# Patient Record
Sex: Female | Born: 1955
Health system: Southern US, Community
[De-identification: ages and names within clinical notes are randomized; demographics above are authoritative.]

## PROBLEM LIST (undated history)

## (undated) DIAGNOSIS — J45909 Unspecified asthma, uncomplicated: Secondary | ICD-10-CM

## (undated) DIAGNOSIS — R0602 Shortness of breath: Secondary | ICD-10-CM

## (undated) DIAGNOSIS — M51379 Other intervertebral disc degeneration, lumbosacral region without mention of lumbar back pain or lower extremity pain: Secondary | ICD-10-CM

## (undated) DIAGNOSIS — I8390 Asymptomatic varicose veins of unspecified lower extremity: Secondary | ICD-10-CM

## (undated) DIAGNOSIS — K5730 Diverticulosis of large intestine without perforation or abscess without bleeding: Secondary | ICD-10-CM

## (undated) DIAGNOSIS — T7840XA Allergy, unspecified, initial encounter: Secondary | ICD-10-CM

## (undated) DIAGNOSIS — E669 Obesity, unspecified: Secondary | ICD-10-CM

## (undated) DIAGNOSIS — M545 Low back pain: Secondary | ICD-10-CM

## (undated) DIAGNOSIS — M5137 Other intervertebral disc degeneration, lumbosacral region: Secondary | ICD-10-CM

## (undated) DIAGNOSIS — E785 Hyperlipidemia, unspecified: Secondary | ICD-10-CM

## (undated) DIAGNOSIS — K219 Gastro-esophageal reflux disease without esophagitis: Secondary | ICD-10-CM

## (undated) DIAGNOSIS — R011 Cardiac murmur, unspecified: Secondary | ICD-10-CM

## (undated) DIAGNOSIS — E039 Hypothyroidism, unspecified: Secondary | ICD-10-CM

## (undated) DIAGNOSIS — R002 Palpitations: Secondary | ICD-10-CM

## (undated) DIAGNOSIS — Z87442 Personal history of urinary calculi: Secondary | ICD-10-CM

## (undated) DIAGNOSIS — E079 Disorder of thyroid, unspecified: Secondary | ICD-10-CM

## (undated) DIAGNOSIS — F419 Anxiety disorder, unspecified: Secondary | ICD-10-CM

## (undated) DIAGNOSIS — Z862 Personal history of diseases of the blood and blood-forming organs and certain disorders involving the immune mechanism: Secondary | ICD-10-CM

## (undated) DIAGNOSIS — G8929 Other chronic pain: Secondary | ICD-10-CM

## (undated) DIAGNOSIS — R519 Headache, unspecified: Secondary | ICD-10-CM

## (undated) DIAGNOSIS — S060X9A Concussion with loss of consciousness of unspecified duration, initial encounter: Secondary | ICD-10-CM

## (undated) DIAGNOSIS — J189 Pneumonia, unspecified organism: Secondary | ICD-10-CM

## (undated) DIAGNOSIS — J4530 Mild persistent asthma, uncomplicated: Secondary | ICD-10-CM

## (undated) DIAGNOSIS — M1990 Unspecified osteoarthritis, unspecified site: Secondary | ICD-10-CM

## (undated) DIAGNOSIS — N393 Stress incontinence (female) (male): Secondary | ICD-10-CM

## (undated) DIAGNOSIS — I2510 Atherosclerotic heart disease of native coronary artery without angina pectoris: Secondary | ICD-10-CM

## (undated) DIAGNOSIS — R600 Localized edema: Secondary | ICD-10-CM

## (undated) DIAGNOSIS — Z860101 Personal history of adenomatous and serrated colon polyps: Secondary | ICD-10-CM

## (undated) DIAGNOSIS — R112 Nausea with vomiting, unspecified: Secondary | ICD-10-CM

## (undated) DIAGNOSIS — M519 Unspecified thoracic, thoracolumbar and lumbosacral intervertebral disc disorder: Secondary | ICD-10-CM

## (undated) DIAGNOSIS — T4145XA Adverse effect of unspecified anesthetic, initial encounter: Secondary | ICD-10-CM

## (undated) DIAGNOSIS — Z8489 Family history of other specified conditions: Secondary | ICD-10-CM

## (undated) DIAGNOSIS — I8393 Asymptomatic varicose veins of bilateral lower extremities: Secondary | ICD-10-CM

## (undated) DIAGNOSIS — N200 Calculus of kidney: Secondary | ICD-10-CM

## (undated) DIAGNOSIS — Z9889 Other specified postprocedural states: Secondary | ICD-10-CM

## (undated) DIAGNOSIS — Z86010 Personal history of colonic polyps: Secondary | ICD-10-CM

## (undated) DIAGNOSIS — N312 Flaccid neuropathic bladder, not elsewhere classified: Secondary | ICD-10-CM

## (undated) HISTORY — DX: Low back pain: M54.5

## (undated) HISTORY — PX: COLONOSCOPY: SHX174

## (undated) HISTORY — DX: Other chronic pain: G89.29

## (undated) HISTORY — DX: Flaccid neuropathic bladder, not elsewhere classified: N31.2

## (undated) HISTORY — DX: Disorder of thyroid, unspecified: E07.9

## (undated) HISTORY — PX: OVARIAN CYST SURGERY: SHX726

## (undated) HISTORY — PX: NASAL SINUS SURGERY: SHX719

## (undated) HISTORY — DX: Personal history of colonic polyps: Z86.010

## (undated) HISTORY — PX: CHOLECYSTECTOMY: SHX55

## (undated) HISTORY — DX: Obesity, unspecified: E66.9

## (undated) HISTORY — PX: BREAST SURGERY: SHX581

## (undated) HISTORY — DX: Calculus of kidney: N20.0

## (undated) HISTORY — DX: Personal history of adenomatous and serrated colon polyps: Z86.0101

## (undated) HISTORY — DX: Atherosclerotic heart disease of native coronary artery without angina pectoris: I25.10

## (undated) HISTORY — DX: Personal history of urinary calculi: Z87.442

## (undated) HISTORY — PX: ABDOMINAL HYSTERECTOMY: SHX81

## (undated) HISTORY — DX: Unspecified osteoarthritis, unspecified site: M19.90

## (undated) HISTORY — DX: Hyperlipidemia, unspecified: E78.5

## (undated) HISTORY — DX: Unspecified thoracic, thoracolumbar and lumbosacral intervertebral disc disorder: M51.9

## (undated) HISTORY — PX: WISDOM TOOTH EXTRACTION: SHX21

## (undated) HISTORY — DX: Diverticulosis of large intestine without perforation or abscess without bleeding: K57.30

## (undated) HISTORY — DX: Anxiety disorder, unspecified: F41.9

## (undated) HISTORY — PX: DIAGNOSTIC LAPAROSCOPY: SUR761

## (undated) HISTORY — DX: Allergy, unspecified, initial encounter: T78.40XA

## (undated) HISTORY — PX: BLADDER SURGERY: SHX569

---

## 1990-04-24 HISTORY — PX: VAGINAL HYSTERECTOMY: SUR661

## 1995-04-25 DIAGNOSIS — Z87820 Personal history of traumatic brain injury: Secondary | ICD-10-CM

## 1995-04-25 HISTORY — DX: Personal history of traumatic brain injury: Z87.820

## 1998-02-16 ENCOUNTER — Ambulatory Visit (HOSPITAL_COMMUNITY): Admission: RE | Admit: 1998-02-16 | Discharge: 1998-02-16 | Payer: Self-pay | Admitting: *Deleted

## 1998-04-24 HISTORY — PX: LAPAROSCOPIC CHOLECYSTECTOMY: SUR755

## 1998-05-04 ENCOUNTER — Encounter: Admission: RE | Admit: 1998-05-04 | Discharge: 1998-07-13 | Payer: Self-pay | Admitting: *Deleted

## 1998-05-05 ENCOUNTER — Encounter: Admission: RE | Admit: 1998-05-05 | Discharge: 1998-06-28 | Payer: Self-pay | Admitting: *Deleted

## 1998-05-14 ENCOUNTER — Encounter: Payer: Self-pay | Admitting: *Deleted

## 1998-05-14 ENCOUNTER — Ambulatory Visit (HOSPITAL_COMMUNITY): Admission: RE | Admit: 1998-05-14 | Discharge: 1998-05-14 | Payer: Self-pay | Admitting: *Deleted

## 1998-06-16 ENCOUNTER — Emergency Department (HOSPITAL_COMMUNITY): Admission: EM | Admit: 1998-06-16 | Discharge: 1998-06-16 | Payer: Self-pay | Admitting: Emergency Medicine

## 1998-06-28 ENCOUNTER — Encounter: Admission: RE | Admit: 1998-06-28 | Discharge: 1998-07-19 | Payer: Self-pay | Admitting: *Deleted

## 1998-07-02 ENCOUNTER — Ambulatory Visit (HOSPITAL_COMMUNITY): Admission: RE | Admit: 1998-07-02 | Discharge: 1998-07-02 | Payer: Self-pay | Admitting: *Deleted

## 1998-07-07 ENCOUNTER — Ambulatory Visit (HOSPITAL_COMMUNITY): Admission: RE | Admit: 1998-07-07 | Discharge: 1998-07-07 | Payer: Self-pay | Admitting: Family Medicine

## 1998-07-07 ENCOUNTER — Encounter: Payer: Self-pay | Admitting: Family Medicine

## 1998-07-08 ENCOUNTER — Emergency Department (HOSPITAL_COMMUNITY): Admission: EM | Admit: 1998-07-08 | Discharge: 1998-07-08 | Payer: Self-pay

## 1998-07-13 ENCOUNTER — Encounter: Admission: RE | Admit: 1998-07-13 | Discharge: 1998-10-11 | Payer: Self-pay | Admitting: Anesthesiology

## 1998-09-06 ENCOUNTER — Ambulatory Visit: Admission: RE | Admit: 1998-09-06 | Discharge: 1998-09-06 | Payer: Self-pay | Admitting: Occupational Medicine

## 1998-10-20 ENCOUNTER — Encounter (HOSPITAL_COMMUNITY): Admission: RE | Admit: 1998-10-20 | Discharge: 1999-01-18 | Payer: Self-pay | Admitting: Occupational Medicine

## 1998-11-26 ENCOUNTER — Encounter: Payer: Self-pay | Admitting: Emergency Medicine

## 1998-11-26 ENCOUNTER — Emergency Department (HOSPITAL_COMMUNITY): Admission: EM | Admit: 1998-11-26 | Discharge: 1998-11-26 | Payer: Self-pay | Admitting: Emergency Medicine

## 1998-12-01 ENCOUNTER — Inpatient Hospital Stay (HOSPITAL_COMMUNITY): Admission: EM | Admit: 1998-12-01 | Discharge: 1998-12-02 | Payer: Self-pay | Admitting: Emergency Medicine

## 1998-12-01 ENCOUNTER — Encounter: Payer: Self-pay | Admitting: General Surgery

## 1998-12-12 ENCOUNTER — Encounter: Payer: Self-pay | Admitting: Neurosurgery

## 1998-12-12 ENCOUNTER — Ambulatory Visit (HOSPITAL_COMMUNITY): Admission: RE | Admit: 1998-12-12 | Discharge: 1998-12-12 | Payer: Self-pay | Admitting: Neurosurgery

## 1998-12-14 ENCOUNTER — Encounter: Payer: Self-pay | Admitting: *Deleted

## 1998-12-14 ENCOUNTER — Ambulatory Visit (HOSPITAL_COMMUNITY): Admission: RE | Admit: 1998-12-14 | Discharge: 1998-12-14 | Payer: Self-pay | Admitting: *Deleted

## 1998-12-16 ENCOUNTER — Encounter: Payer: Self-pay | Admitting: General Surgery

## 1998-12-16 ENCOUNTER — Ambulatory Visit (HOSPITAL_COMMUNITY): Admission: RE | Admit: 1998-12-16 | Discharge: 1998-12-16 | Payer: Self-pay | Admitting: General Surgery

## 1998-12-21 ENCOUNTER — Encounter: Payer: Self-pay | Admitting: General Surgery

## 1998-12-23 ENCOUNTER — Encounter (INDEPENDENT_AMBULATORY_CARE_PROVIDER_SITE_OTHER): Payer: Self-pay | Admitting: Specialist

## 1998-12-23 ENCOUNTER — Observation Stay (HOSPITAL_COMMUNITY): Admission: RE | Admit: 1998-12-23 | Discharge: 1998-12-24 | Payer: Self-pay | Admitting: General Surgery

## 1998-12-23 ENCOUNTER — Encounter: Payer: Self-pay | Admitting: General Surgery

## 1999-01-06 ENCOUNTER — Ambulatory Visit (HOSPITAL_COMMUNITY): Admission: RE | Admit: 1999-01-06 | Discharge: 1999-01-06 | Payer: Self-pay | Admitting: *Deleted

## 1999-01-06 ENCOUNTER — Encounter: Payer: Self-pay | Admitting: *Deleted

## 1999-01-12 ENCOUNTER — Ambulatory Visit (HOSPITAL_COMMUNITY): Admission: RE | Admit: 1999-01-12 | Discharge: 1999-01-12 | Payer: Self-pay | Admitting: Anesthesiology

## 1999-01-12 ENCOUNTER — Encounter: Payer: Self-pay | Admitting: General Surgery

## 1999-01-31 ENCOUNTER — Encounter: Admission: RE | Admit: 1999-01-31 | Discharge: 1999-05-01 | Payer: Self-pay | Admitting: *Deleted

## 1999-04-15 ENCOUNTER — Encounter: Payer: Self-pay | Admitting: *Deleted

## 1999-04-15 ENCOUNTER — Encounter: Admission: RE | Admit: 1999-04-15 | Discharge: 1999-04-15 | Payer: Self-pay | Admitting: *Deleted

## 1999-10-04 ENCOUNTER — Other Ambulatory Visit: Admission: RE | Admit: 1999-10-04 | Discharge: 1999-10-04 | Payer: Self-pay | Admitting: *Deleted

## 2000-03-27 ENCOUNTER — Ambulatory Visit (HOSPITAL_COMMUNITY): Admission: RE | Admit: 2000-03-27 | Discharge: 2000-03-27 | Payer: Self-pay | Admitting: *Deleted

## 2000-03-27 ENCOUNTER — Encounter: Payer: Self-pay | Admitting: *Deleted

## 2000-04-18 ENCOUNTER — Encounter: Payer: Self-pay | Admitting: *Deleted

## 2000-04-18 ENCOUNTER — Encounter: Admission: RE | Admit: 2000-04-18 | Discharge: 2000-04-18 | Payer: Self-pay | Admitting: *Deleted

## 2000-07-07 ENCOUNTER — Emergency Department (HOSPITAL_COMMUNITY): Admission: EM | Admit: 2000-07-07 | Discharge: 2000-07-08 | Payer: Self-pay | Admitting: Emergency Medicine

## 2000-07-08 ENCOUNTER — Encounter: Payer: Self-pay | Admitting: Emergency Medicine

## 2000-11-20 ENCOUNTER — Encounter: Payer: Self-pay | Admitting: Internal Medicine

## 2000-11-20 ENCOUNTER — Ambulatory Visit (HOSPITAL_COMMUNITY): Admission: RE | Admit: 2000-11-20 | Discharge: 2000-11-20 | Payer: Self-pay | Admitting: Internal Medicine

## 2001-01-22 ENCOUNTER — Other Ambulatory Visit: Admission: RE | Admit: 2001-01-22 | Discharge: 2001-01-22 | Payer: Self-pay | Admitting: *Deleted

## 2001-03-26 ENCOUNTER — Ambulatory Visit (HOSPITAL_COMMUNITY): Admission: RE | Admit: 2001-03-26 | Discharge: 2001-03-26 | Payer: Self-pay | Admitting: *Deleted

## 2001-03-26 ENCOUNTER — Encounter: Payer: Self-pay | Admitting: *Deleted

## 2001-04-22 ENCOUNTER — Encounter: Admission: RE | Admit: 2001-04-22 | Discharge: 2001-04-22 | Payer: Self-pay | Admitting: *Deleted

## 2001-04-22 ENCOUNTER — Encounter: Payer: Self-pay | Admitting: *Deleted

## 2001-06-02 ENCOUNTER — Encounter: Payer: Self-pay | Admitting: Emergency Medicine

## 2001-06-02 ENCOUNTER — Emergency Department (HOSPITAL_COMMUNITY): Admission: EM | Admit: 2001-06-02 | Discharge: 2001-06-02 | Payer: Self-pay | Admitting: Emergency Medicine

## 2001-08-15 ENCOUNTER — Encounter (HOSPITAL_COMMUNITY): Admission: RE | Admit: 2001-08-15 | Discharge: 2001-11-13 | Payer: Self-pay | Admitting: *Deleted

## 2002-02-06 ENCOUNTER — Encounter (HOSPITAL_COMMUNITY): Admission: RE | Admit: 2002-02-06 | Discharge: 2002-02-06 | Payer: Self-pay | Admitting: Orthopedic Surgery

## 2002-02-10 ENCOUNTER — Ambulatory Visit (HOSPITAL_COMMUNITY): Admission: RE | Admit: 2002-02-10 | Discharge: 2002-02-10 | Payer: Self-pay | Admitting: Orthopedic Surgery

## 2002-02-10 ENCOUNTER — Encounter: Payer: Self-pay | Admitting: Orthopedic Surgery

## 2002-02-23 LAB — HM COLONOSCOPY

## 2002-03-24 ENCOUNTER — Other Ambulatory Visit: Admission: RE | Admit: 2002-03-24 | Discharge: 2002-03-24 | Payer: Self-pay | Admitting: Obstetrics and Gynecology

## 2002-04-23 ENCOUNTER — Encounter: Admission: RE | Admit: 2002-04-23 | Discharge: 2002-04-23 | Payer: Self-pay | Admitting: Obstetrics and Gynecology

## 2002-04-23 ENCOUNTER — Encounter: Payer: Self-pay | Admitting: Obstetrics and Gynecology

## 2002-05-01 ENCOUNTER — Encounter: Admission: RE | Admit: 2002-05-01 | Discharge: 2002-05-01 | Payer: Self-pay | Admitting: Obstetrics and Gynecology

## 2002-05-01 ENCOUNTER — Encounter: Payer: Self-pay | Admitting: Obstetrics and Gynecology

## 2002-07-15 ENCOUNTER — Encounter: Payer: Self-pay | Admitting: Emergency Medicine

## 2002-07-15 ENCOUNTER — Emergency Department (HOSPITAL_COMMUNITY): Admission: EM | Admit: 2002-07-15 | Discharge: 2002-07-15 | Payer: Self-pay | Admitting: Emergency Medicine

## 2002-10-15 ENCOUNTER — Encounter (HOSPITAL_COMMUNITY): Admission: RE | Admit: 2002-10-15 | Discharge: 2002-10-15 | Payer: Self-pay | Admitting: *Deleted

## 2002-12-24 ENCOUNTER — Ambulatory Visit (HOSPITAL_COMMUNITY): Admission: RE | Admit: 2002-12-24 | Discharge: 2002-12-24 | Payer: Self-pay | Admitting: *Deleted

## 2002-12-26 ENCOUNTER — Ambulatory Visit (HOSPITAL_COMMUNITY): Admission: RE | Admit: 2002-12-26 | Discharge: 2002-12-26 | Payer: Self-pay | Admitting: *Deleted

## 2003-04-01 ENCOUNTER — Other Ambulatory Visit: Admission: RE | Admit: 2003-04-01 | Discharge: 2003-04-01 | Payer: Self-pay | Admitting: Obstetrics and Gynecology

## 2003-06-04 ENCOUNTER — Encounter: Admission: RE | Admit: 2003-06-04 | Discharge: 2003-06-04 | Payer: Self-pay | Admitting: Obstetrics and Gynecology

## 2004-02-16 ENCOUNTER — Ambulatory Visit (HOSPITAL_COMMUNITY): Admission: RE | Admit: 2004-02-16 | Discharge: 2004-02-16 | Payer: Self-pay | Admitting: Obstetrics and Gynecology

## 2004-03-23 ENCOUNTER — Ambulatory Visit (HOSPITAL_COMMUNITY): Admission: RE | Admit: 2004-03-23 | Discharge: 2004-03-23 | Payer: Self-pay | Admitting: Obstetrics and Gynecology

## 2004-06-06 ENCOUNTER — Encounter: Admission: RE | Admit: 2004-06-06 | Discharge: 2004-06-06 | Payer: Self-pay | Admitting: Obstetrics and Gynecology

## 2004-06-16 ENCOUNTER — Ambulatory Visit: Payer: Self-pay | Admitting: Internal Medicine

## 2004-06-20 ENCOUNTER — Ambulatory Visit: Payer: Self-pay | Admitting: Internal Medicine

## 2004-10-07 ENCOUNTER — Ambulatory Visit: Payer: Self-pay | Admitting: Internal Medicine

## 2004-10-11 ENCOUNTER — Ambulatory Visit: Payer: Self-pay | Admitting: Internal Medicine

## 2004-12-21 ENCOUNTER — Other Ambulatory Visit: Admission: RE | Admit: 2004-12-21 | Discharge: 2004-12-21 | Payer: Self-pay | Admitting: Obstetrics and Gynecology

## 2005-01-06 ENCOUNTER — Ambulatory Visit (HOSPITAL_COMMUNITY): Admission: RE | Admit: 2005-01-06 | Discharge: 2005-01-06 | Payer: Self-pay | Admitting: Internal Medicine

## 2005-01-06 ENCOUNTER — Ambulatory Visit: Payer: Self-pay | Admitting: Internal Medicine

## 2005-01-09 ENCOUNTER — Ambulatory Visit: Payer: Self-pay | Admitting: Internal Medicine

## 2005-02-07 ENCOUNTER — Ambulatory Visit: Payer: Self-pay | Admitting: Internal Medicine

## 2005-06-05 ENCOUNTER — Emergency Department (HOSPITAL_COMMUNITY): Admission: EM | Admit: 2005-06-05 | Discharge: 2005-06-05 | Payer: Self-pay | Admitting: Family Medicine

## 2005-06-10 ENCOUNTER — Ambulatory Visit: Payer: Self-pay | Admitting: Family Medicine

## 2005-08-10 ENCOUNTER — Ambulatory Visit (HOSPITAL_COMMUNITY): Admission: RE | Admit: 2005-08-10 | Discharge: 2005-08-10 | Payer: Self-pay | Admitting: Obstetrics and Gynecology

## 2005-08-23 ENCOUNTER — Encounter: Payer: Self-pay | Admitting: Emergency Medicine

## 2005-12-20 ENCOUNTER — Ambulatory Visit: Payer: Self-pay | Admitting: Internal Medicine

## 2005-12-27 ENCOUNTER — Ambulatory Visit: Payer: Self-pay | Admitting: Internal Medicine

## 2006-01-19 ENCOUNTER — Ambulatory Visit: Payer: Self-pay | Admitting: Internal Medicine

## 2006-03-21 ENCOUNTER — Ambulatory Visit (HOSPITAL_COMMUNITY)
Admission: RE | Admit: 2006-03-21 | Discharge: 2006-03-21 | Payer: Self-pay | Admitting: Physical Medicine and Rehabilitation

## 2006-04-09 ENCOUNTER — Encounter
Admission: RE | Admit: 2006-04-09 | Discharge: 2006-04-09 | Payer: Self-pay | Admitting: Physical Medicine and Rehabilitation

## 2006-04-09 ENCOUNTER — Emergency Department (HOSPITAL_COMMUNITY): Admission: EM | Admit: 2006-04-09 | Discharge: 2006-04-09 | Payer: Self-pay | Admitting: Emergency Medicine

## 2006-04-14 ENCOUNTER — Emergency Department (HOSPITAL_COMMUNITY): Admission: EM | Admit: 2006-04-14 | Discharge: 2006-04-14 | Payer: Self-pay | Admitting: Emergency Medicine

## 2006-04-24 HISTORY — PX: CYSTOSCOPY: SUR368

## 2006-05-01 ENCOUNTER — Ambulatory Visit (HOSPITAL_COMMUNITY): Admission: RE | Admit: 2006-05-01 | Discharge: 2006-05-01 | Payer: Self-pay | Admitting: Orthopedic Surgery

## 2006-05-31 ENCOUNTER — Ambulatory Visit (HOSPITAL_COMMUNITY): Admission: RE | Admit: 2006-05-31 | Discharge: 2006-05-31 | Payer: Self-pay | Admitting: Orthopedic Surgery

## 2006-07-27 ENCOUNTER — Ambulatory Visit: Payer: Self-pay | Admitting: Internal Medicine

## 2006-08-24 ENCOUNTER — Ambulatory Visit (HOSPITAL_COMMUNITY): Admission: RE | Admit: 2006-08-24 | Discharge: 2006-08-24 | Payer: Self-pay | Admitting: Obstetrics and Gynecology

## 2006-10-22 ENCOUNTER — Ambulatory Visit: Payer: Self-pay | Admitting: Internal Medicine

## 2007-01-02 ENCOUNTER — Ambulatory Visit: Payer: Self-pay | Admitting: Internal Medicine

## 2007-01-31 ENCOUNTER — Ambulatory Visit: Payer: Self-pay | Admitting: Internal Medicine

## 2007-01-31 LAB — CONVERTED CEMR LAB
ALT: 24 units/L (ref 0–35)
AST: 22 units/L (ref 0–37)
Albumin: 3.8 g/dL (ref 3.5–5.2)
Alkaline Phosphatase: 71 units/L (ref 39–117)
BUN: 8 mg/dL (ref 6–23)
Basophils Absolute: 0 10*3/uL (ref 0.0–0.1)
Basophils Relative: 0.5 % (ref 0.0–1.0)
Bilirubin Urine: NEGATIVE
Bilirubin, Direct: 0.1 mg/dL (ref 0.0–0.3)
CO2: 27 meq/L (ref 19–32)
Calcium: 9.3 mg/dL (ref 8.4–10.5)
Chloride: 103 meq/L (ref 96–112)
Cholesterol: 145 mg/dL (ref 0–200)
Creatinine, Ser: 0.7 mg/dL (ref 0.4–1.2)
Eosinophils Absolute: 0.5 10*3/uL (ref 0.0–0.6)
Eosinophils Relative: 7.1 % — ABNORMAL HIGH (ref 0.0–5.0)
GFR calc Af Amer: 114 mL/min
GFR calc non Af Amer: 95 mL/min
Glucose, Bld: 90 mg/dL (ref 70–99)
HCT: 36.3 % (ref 36.0–46.0)
HDL: 54.4 mg/dL (ref >39.0)
Hemoglobin, Urine: NEGATIVE
Hemoglobin: 12.5 g/dL (ref 12.0–15.0)
Ketones, ur: NEGATIVE mg/dL
LDL Cholesterol: 80 mg/dL (ref 0–99)
Leukocytes, UA: NEGATIVE
Lymphocytes Relative: 27.5 % (ref 12.0–46.0)
MCHC: 34.4 g/dL (ref 30.0–36.0)
MCV: 93.8 fL (ref 78.0–100.0)
Monocytes Absolute: 0.5 10*3/uL (ref 0.2–0.7)
Monocytes Relative: 7.7 % (ref 3.0–11.0)
Neutro Abs: 4 10*3/uL (ref 1.4–7.7)
Neutrophils Relative %: 57.2 % (ref 43.0–77.0)
Nitrite: NEGATIVE
Platelets: 244 10*3/uL (ref 150–400)
Potassium: 3.4 meq/L — ABNORMAL LOW (ref 3.5–5.1)
RBC: 3.87 M/uL (ref 3.87–5.11)
RDW: 12.2 % (ref 11.5–14.6)
Sodium: 138 meq/L (ref 135–145)
Specific Gravity, Urine: <=1.005 (ref 1.000–1.03)
TSH: 2.06 microintl units/mL (ref 0.35–5.50)
Total Bilirubin: 0.6 mg/dL (ref 0.3–1.2)
Total CHOL/HDL Ratio: 2.7
Total Protein, Urine: NEGATIVE mg/dL
Total Protein: 6.8 g/dL (ref 6.0–8.3)
Triglycerides: 52 mg/dL (ref 0–149)
Urine Glucose: NEGATIVE mg/dL
Urobilinogen, UA: 0.2 (ref 0.0–1.0)
VLDL: 10 mg/dL (ref 0–40)
WBC: 6.9 10*3/uL (ref 4.5–10.5)
pH: 6.5 (ref 5.0–8.0)

## 2007-02-06 ENCOUNTER — Telehealth: Payer: Self-pay | Admitting: Internal Medicine

## 2007-02-26 ENCOUNTER — Ambulatory Visit: Payer: Self-pay | Admitting: Internal Medicine

## 2007-03-12 ENCOUNTER — Ambulatory Visit: Payer: Self-pay | Admitting: Internal Medicine

## 2007-03-14 ENCOUNTER — Encounter: Payer: Self-pay | Admitting: Internal Medicine

## 2007-04-04 ENCOUNTER — Encounter: Payer: Self-pay | Admitting: Internal Medicine

## 2007-07-08 ENCOUNTER — Ambulatory Visit: Payer: Self-pay | Admitting: Cardiology

## 2007-07-08 ENCOUNTER — Inpatient Hospital Stay (HOSPITAL_COMMUNITY): Admission: EM | Admit: 2007-07-08 | Discharge: 2007-07-09 | Payer: Self-pay | Admitting: Emergency Medicine

## 2007-07-08 ENCOUNTER — Ambulatory Visit: Payer: Self-pay | Admitting: Infectious Diseases

## 2007-07-08 ENCOUNTER — Ambulatory Visit: Payer: Self-pay | Admitting: Internal Medicine

## 2007-07-09 ENCOUNTER — Encounter: Payer: Self-pay | Admitting: Internal Medicine

## 2007-07-19 ENCOUNTER — Ambulatory Visit: Payer: Self-pay | Admitting: Internal Medicine

## 2007-07-19 DIAGNOSIS — R079 Chest pain, unspecified: Secondary | ICD-10-CM | POA: Insufficient documentation

## 2007-07-19 DIAGNOSIS — Z860100 Personal history of colon polyps, unspecified: Secondary | ICD-10-CM | POA: Insufficient documentation

## 2007-07-19 DIAGNOSIS — F411 Generalized anxiety disorder: Secondary | ICD-10-CM | POA: Insufficient documentation

## 2007-07-19 DIAGNOSIS — R339 Retention of urine, unspecified: Secondary | ICD-10-CM | POA: Insufficient documentation

## 2007-07-19 DIAGNOSIS — R942 Abnormal results of pulmonary function studies: Secondary | ICD-10-CM | POA: Insufficient documentation

## 2007-07-19 DIAGNOSIS — E039 Hypothyroidism, unspecified: Secondary | ICD-10-CM | POA: Insufficient documentation

## 2007-07-19 DIAGNOSIS — IMO0002 Reserved for concepts with insufficient information to code with codable children: Secondary | ICD-10-CM | POA: Insufficient documentation

## 2007-07-19 DIAGNOSIS — K5730 Diverticulosis of large intestine without perforation or abscess without bleeding: Secondary | ICD-10-CM | POA: Insufficient documentation

## 2007-07-19 DIAGNOSIS — J309 Allergic rhinitis, unspecified: Secondary | ICD-10-CM | POA: Insufficient documentation

## 2007-08-12 ENCOUNTER — Telehealth: Payer: Self-pay | Admitting: Internal Medicine

## 2007-08-27 ENCOUNTER — Ambulatory Visit (HOSPITAL_COMMUNITY): Admission: RE | Admit: 2007-08-27 | Discharge: 2007-08-27 | Payer: Self-pay | Admitting: Obstetrics and Gynecology

## 2007-09-06 ENCOUNTER — Encounter: Admission: RE | Admit: 2007-09-06 | Discharge: 2007-09-06 | Payer: Self-pay | Admitting: Obstetrics and Gynecology

## 2007-09-27 ENCOUNTER — Encounter: Admission: RE | Admit: 2007-09-27 | Discharge: 2007-09-27 | Payer: Self-pay | Admitting: Obstetrics and Gynecology

## 2007-10-25 ENCOUNTER — Encounter: Payer: Self-pay | Admitting: Internal Medicine

## 2007-10-30 ENCOUNTER — Encounter: Payer: Self-pay | Admitting: Internal Medicine

## 2007-11-05 ENCOUNTER — Encounter: Payer: Self-pay | Admitting: Internal Medicine

## 2007-11-06 ENCOUNTER — Ambulatory Visit: Payer: Self-pay | Admitting: Cardiology

## 2007-11-12 ENCOUNTER — Encounter: Admission: RE | Admit: 2007-11-12 | Discharge: 2007-11-12 | Payer: Self-pay | Admitting: General Surgery

## 2007-11-23 HISTORY — PX: BREAST SURGERY: SHX581

## 2007-12-02 ENCOUNTER — Encounter (INDEPENDENT_AMBULATORY_CARE_PROVIDER_SITE_OTHER): Payer: Self-pay | Admitting: General Surgery

## 2007-12-02 ENCOUNTER — Encounter: Admission: RE | Admit: 2007-12-02 | Discharge: 2007-12-02 | Payer: Self-pay | Admitting: General Surgery

## 2007-12-02 ENCOUNTER — Ambulatory Visit (HOSPITAL_BASED_OUTPATIENT_CLINIC_OR_DEPARTMENT_OTHER): Admission: RE | Admit: 2007-12-02 | Discharge: 2007-12-02 | Payer: Self-pay | Admitting: General Surgery

## 2007-12-02 HISTORY — PX: BREAST EXCISIONAL BIOPSY: SUR124

## 2007-12-09 ENCOUNTER — Encounter: Payer: Self-pay | Admitting: Internal Medicine

## 2008-01-03 ENCOUNTER — Telehealth (INDEPENDENT_AMBULATORY_CARE_PROVIDER_SITE_OTHER): Payer: Self-pay | Admitting: *Deleted

## 2008-01-11 ENCOUNTER — Emergency Department (HOSPITAL_COMMUNITY): Admission: EM | Admit: 2008-01-11 | Discharge: 2008-01-11 | Payer: Self-pay | Admitting: Family Medicine

## 2008-02-23 ENCOUNTER — Emergency Department (HOSPITAL_COMMUNITY): Admission: EM | Admit: 2008-02-23 | Discharge: 2008-02-23 | Payer: Self-pay | Admitting: Emergency Medicine

## 2008-02-25 ENCOUNTER — Ambulatory Visit: Payer: Self-pay | Admitting: Internal Medicine

## 2008-02-25 DIAGNOSIS — J019 Acute sinusitis, unspecified: Secondary | ICD-10-CM | POA: Insufficient documentation

## 2008-03-12 ENCOUNTER — Encounter
Admission: RE | Admit: 2008-03-12 | Discharge: 2008-04-21 | Payer: Self-pay | Admitting: Physical Medicine and Rehabilitation

## 2008-03-24 ENCOUNTER — Emergency Department (HOSPITAL_COMMUNITY): Admission: EM | Admit: 2008-03-24 | Discharge: 2008-03-25 | Payer: Self-pay | Admitting: Emergency Medicine

## 2008-04-08 ENCOUNTER — Telehealth: Payer: Self-pay | Admitting: Internal Medicine

## 2008-04-08 ENCOUNTER — Ambulatory Visit: Payer: Self-pay | Admitting: Internal Medicine

## 2008-04-08 LAB — CONVERTED CEMR LAB
ALT: 23 units/L (ref 0–35)
AST: 20 units/L (ref 0–37)
Albumin: 3.8 g/dL (ref 3.5–5.2)
Alkaline Phosphatase: 79 units/L (ref 39–117)
BUN: 13 mg/dL (ref 6–23)
Basophils Absolute: 0 10*3/uL (ref 0.0–0.1)
Basophils Relative: 0 % (ref 0.0–3.0)
Bilirubin Urine: NEGATIVE
Bilirubin, Direct: 0.1 mg/dL (ref 0.0–0.3)
CO2: 30 meq/L (ref 19–32)
Calcium: 9.5 mg/dL (ref 8.4–10.5)
Chloride: 104 meq/L (ref 96–112)
Cholesterol: 166 mg/dL (ref 0–200)
Creatinine, Ser: 0.7 mg/dL (ref 0.4–1.2)
Eosinophils Absolute: 0.4 10*3/uL (ref 0.0–0.7)
Eosinophils Relative: 6.8 % — ABNORMAL HIGH (ref 0.0–5.0)
GFR calc Af Amer: 114 mL/min
GFR calc non Af Amer: 94 mL/min
Glucose, Bld: 92 mg/dL (ref 70–99)
HCT: 37.9 % (ref 36.0–46.0)
HDL: 49 mg/dL (ref >39.0)
Hemoglobin: 13.2 g/dL (ref 12.0–15.0)
Ketones, ur: NEGATIVE mg/dL
LDL Cholesterol: 99 mg/dL (ref 0–99)
Leukocytes, UA: NEGATIVE
Lymphocytes Relative: 24.6 % (ref 12.0–46.0)
MCHC: 34.8 g/dL (ref 30.0–36.0)
MCV: 92.6 fL (ref 78.0–100.0)
Monocytes Absolute: 0.4 10*3/uL (ref 0.1–1.0)
Monocytes Relative: 6.9 % (ref 3.0–12.0)
Neutro Abs: 3.8 10*3/uL (ref 1.4–7.7)
Neutrophils Relative %: 61.7 % (ref 43.0–77.0)
Nitrite: NEGATIVE
Platelets: 225 10*3/uL (ref 150–400)
Potassium: 4.8 meq/L (ref 3.5–5.1)
RBC: 4.1 M/uL (ref 3.87–5.11)
RDW: 12.6 % (ref 11.5–14.6)
Sodium: 141 meq/L (ref 135–145)
Specific Gravity, Urine: <=1.005 (ref 1.000–1.03)
TSH: 1.42 microintl units/mL (ref 0.35–5.50)
Total Bilirubin: 0.6 mg/dL (ref 0.3–1.2)
Total CHOL/HDL Ratio: 3.4
Total Protein, Urine: NEGATIVE mg/dL
Total Protein: 6.6 g/dL (ref 6.0–8.3)
Triglycerides: 88 mg/dL (ref 0–149)
Urine Glucose: NEGATIVE mg/dL
Urobilinogen, UA: 0.2 (ref 0.0–1.0)
VLDL: 18 mg/dL (ref 0–40)
WBC: 6.1 10*3/uL (ref 4.5–10.5)
pH: 6.5 (ref 5.0–8.0)

## 2008-04-14 ENCOUNTER — Ambulatory Visit: Payer: Self-pay | Admitting: Internal Medicine

## 2008-04-14 DIAGNOSIS — M549 Dorsalgia, unspecified: Secondary | ICD-10-CM | POA: Insufficient documentation

## 2008-04-21 ENCOUNTER — Ambulatory Visit (HOSPITAL_BASED_OUTPATIENT_CLINIC_OR_DEPARTMENT_OTHER)
Admission: RE | Admit: 2008-04-21 | Discharge: 2008-04-21 | Payer: Self-pay | Admitting: Physical Medicine and Rehabilitation

## 2008-05-12 ENCOUNTER — Telehealth: Payer: Self-pay | Admitting: Internal Medicine

## 2008-05-12 ENCOUNTER — Ambulatory Visit: Payer: Self-pay | Admitting: Internal Medicine

## 2008-05-12 DIAGNOSIS — H8309 Labyrinthitis, unspecified ear: Secondary | ICD-10-CM | POA: Insufficient documentation

## 2008-06-02 LAB — CONVERTED CEMR LAB: Pap Smear: NORMAL

## 2008-06-03 ENCOUNTER — Ambulatory Visit: Payer: Self-pay | Admitting: Internal Medicine

## 2008-08-21 ENCOUNTER — Ambulatory Visit: Payer: Self-pay | Admitting: Endocrinology

## 2008-08-27 ENCOUNTER — Encounter: Admission: RE | Admit: 2008-08-27 | Discharge: 2008-08-27 | Payer: Self-pay | Admitting: Obstetrics and Gynecology

## 2008-09-02 ENCOUNTER — Encounter: Admission: RE | Admit: 2008-09-02 | Discharge: 2008-09-02 | Payer: Self-pay | Admitting: Obstetrics and Gynecology

## 2008-09-10 ENCOUNTER — Emergency Department (HOSPITAL_COMMUNITY): Admission: EM | Admit: 2008-09-10 | Discharge: 2008-09-10 | Payer: Self-pay | Admitting: Family Medicine

## 2008-11-27 ENCOUNTER — Ambulatory Visit: Payer: Self-pay | Admitting: Internal Medicine

## 2008-11-27 DIAGNOSIS — L0390 Cellulitis, unspecified: Secondary | ICD-10-CM | POA: Insufficient documentation

## 2008-11-27 DIAGNOSIS — J329 Chronic sinusitis, unspecified: Secondary | ICD-10-CM | POA: Insufficient documentation

## 2008-11-27 DIAGNOSIS — L0291 Cutaneous abscess, unspecified: Secondary | ICD-10-CM | POA: Insufficient documentation

## 2008-12-03 ENCOUNTER — Telehealth: Payer: Self-pay | Admitting: Internal Medicine

## 2008-12-04 ENCOUNTER — Ambulatory Visit: Payer: Self-pay | Admitting: Internal Medicine

## 2009-01-26 ENCOUNTER — Emergency Department (HOSPITAL_COMMUNITY): Admission: EM | Admit: 2009-01-26 | Discharge: 2009-01-27 | Payer: Self-pay | Admitting: Emergency Medicine

## 2009-01-27 ENCOUNTER — Observation Stay (HOSPITAL_COMMUNITY): Admission: EM | Admit: 2009-01-27 | Discharge: 2009-01-28 | Payer: Self-pay | Admitting: Emergency Medicine

## 2009-02-01 ENCOUNTER — Ambulatory Visit: Payer: Self-pay | Admitting: Internal Medicine

## 2009-02-01 DIAGNOSIS — J069 Acute upper respiratory infection, unspecified: Secondary | ICD-10-CM | POA: Insufficient documentation

## 2009-02-04 ENCOUNTER — Telehealth (INDEPENDENT_AMBULATORY_CARE_PROVIDER_SITE_OTHER): Payer: Self-pay | Admitting: *Deleted

## 2009-02-08 ENCOUNTER — Ambulatory Visit: Payer: Self-pay | Admitting: Internal Medicine

## 2009-02-08 ENCOUNTER — Ambulatory Visit: Payer: Self-pay

## 2009-02-08 ENCOUNTER — Encounter (HOSPITAL_COMMUNITY): Admission: RE | Admit: 2009-02-08 | Discharge: 2009-04-20 | Payer: Self-pay | Admitting: Internal Medicine

## 2009-02-11 ENCOUNTER — Telehealth: Payer: Self-pay | Admitting: Internal Medicine

## 2009-03-07 ENCOUNTER — Emergency Department (HOSPITAL_COMMUNITY): Admission: EM | Admit: 2009-03-07 | Discharge: 2009-03-07 | Payer: Self-pay | Admitting: Emergency Medicine

## 2009-03-11 ENCOUNTER — Ambulatory Visit: Payer: Self-pay | Admitting: Cardiology

## 2009-03-11 DIAGNOSIS — R5383 Other fatigue: Secondary | ICD-10-CM | POA: Insufficient documentation

## 2009-03-11 DIAGNOSIS — R5381 Other malaise: Secondary | ICD-10-CM | POA: Insufficient documentation

## 2009-03-12 ENCOUNTER — Telehealth: Payer: Self-pay | Admitting: Cardiology

## 2009-03-12 LAB — CONVERTED CEMR LAB
Basophils Relative: 0.6 % (ref 0.0–3.0)
Eosinophils Relative: 7.5 % — ABNORMAL HIGH (ref 0.0–5.0)
HCT: 43.1 % (ref 36.0–46.0)
Hemoglobin: 14.7 g/dL (ref 12.0–15.0)
Lymphocytes Relative: 26.2 % (ref 12.0–46.0)
MCHC: 34.1 g/dL (ref 30.0–36.0)
MCV: 94.7 fL (ref 78.0–100.0)
Monocytes Relative: 4.5 % (ref 3.0–12.0)
Neutrophils Relative %: 61.2 % (ref 43.0–77.0)
Platelets: 236 10*3/uL (ref 150.0–400.0)
RBC: 4.55 M/uL (ref 3.87–5.11)
RDW: 12.7 % (ref 11.5–14.6)
TSH: 0.42 microintl units/mL (ref 0.35–5.50)
WBC: 12.7 10*3/uL — ABNORMAL HIGH (ref 4.5–10.5)

## 2009-03-16 ENCOUNTER — Ambulatory Visit: Payer: Self-pay | Admitting: Internal Medicine

## 2009-03-16 DIAGNOSIS — D72829 Elevated white blood cell count, unspecified: Secondary | ICD-10-CM | POA: Insufficient documentation

## 2009-03-17 ENCOUNTER — Ambulatory Visit: Payer: Self-pay | Admitting: Internal Medicine

## 2009-03-17 LAB — CONVERTED CEMR LAB
Bilirubin Urine: NEGATIVE
Hemoglobin, Urine: NEGATIVE
Ketones, ur: NEGATIVE mg/dL
Leukocytes, UA: NEGATIVE
Nitrite: NEGATIVE
Specific Gravity, Urine: <=1.005 (ref 1.000–1.030)
Total Protein, Urine: NEGATIVE mg/dL
Urine Glucose: NEGATIVE mg/dL
Urobilinogen, UA: 0.2 (ref 0.0–1.0)
pH: 6 (ref 5.0–8.0)

## 2009-03-23 ENCOUNTER — Encounter: Payer: Self-pay | Admitting: Internal Medicine

## 2009-03-23 LAB — CONVERTED CEMR LAB
BUN: 10 mg/dL (ref 6–23)
CA 125: 9.3 units/mL (ref 0.0–30.2)
CO2: 22 meq/L (ref 19–32)
Calcium: 9.4 mg/dL (ref 8.4–10.5)
Chloride: 105 meq/L (ref 96–112)
Cholesterol: 147 mg/dL (ref 0–200)
Creatinine, Ser: 0.89 mg/dL (ref 0.40–1.20)
Glucose, Bld: 87 mg/dL (ref 70–99)
HCT: 42.4 % (ref 36.0–46.0)
HDL: 52 mg/dL (ref >39)
Hemoglobin: 14.1 g/dL (ref 12.0–15.0)
LDL Cholesterol: 79 mg/dL (ref 0–99)
MCHC: 33.3 g/dL (ref 30.0–36.0)
MCV: 93.4 fL (ref 78.0–100.0)
Platelets: 257 10*3/uL (ref 150–400)
Potassium: 4.3 meq/L (ref 3.5–5.3)
RBC: 4.54 M/uL (ref 3.87–5.11)
RDW: 13.1 % (ref 11.5–15.5)
Sodium: 142 meq/L (ref 135–145)
TSH: 0.402 microintl units/mL (ref 0.350–4.500)
Total CHOL/HDL Ratio: 2.8
Triglycerides: 80 mg/dL (ref <150)
VLDL: 16 mg/dL (ref 0–40)
WBC: 7.4 10*3/uL (ref 4.0–10.5)

## 2009-03-27 ENCOUNTER — Emergency Department (HOSPITAL_COMMUNITY): Admission: EM | Admit: 2009-03-27 | Discharge: 2009-03-27 | Payer: Self-pay | Admitting: Family Medicine

## 2009-06-23 ENCOUNTER — Ambulatory Visit: Payer: Self-pay | Admitting: Internal Medicine

## 2009-06-23 DIAGNOSIS — H6690 Otitis media, unspecified, unspecified ear: Secondary | ICD-10-CM | POA: Insufficient documentation

## 2009-06-30 ENCOUNTER — Emergency Department (HOSPITAL_COMMUNITY): Admission: EM | Admit: 2009-06-30 | Discharge: 2009-06-30 | Payer: Self-pay | Admitting: Family Medicine

## 2009-07-01 ENCOUNTER — Inpatient Hospital Stay (HOSPITAL_COMMUNITY): Admission: AD | Admit: 2009-07-01 | Discharge: 2009-07-03 | Payer: Self-pay | Admitting: Urology

## 2009-08-23 LAB — HM MAMMOGRAPHY: HM Mammogram: NORMAL

## 2009-08-30 ENCOUNTER — Encounter: Admission: RE | Admit: 2009-08-30 | Discharge: 2009-08-30 | Payer: Self-pay | Admitting: Obstetrics and Gynecology

## 2009-09-03 ENCOUNTER — Encounter: Admission: RE | Admit: 2009-09-03 | Discharge: 2009-09-03 | Payer: Self-pay | Admitting: Obstetrics and Gynecology

## 2009-11-10 ENCOUNTER — Ambulatory Visit: Payer: Self-pay | Admitting: Internal Medicine

## 2009-11-10 DIAGNOSIS — N390 Urinary tract infection, site not specified: Secondary | ICD-10-CM | POA: Insufficient documentation

## 2009-11-10 DIAGNOSIS — R350 Frequency of micturition: Secondary | ICD-10-CM | POA: Insufficient documentation

## 2009-11-10 DIAGNOSIS — N312 Flaccid neuropathic bladder, not elsewhere classified: Secondary | ICD-10-CM | POA: Insufficient documentation

## 2009-11-10 LAB — CONVERTED CEMR LAB
Bilirubin Urine: NEGATIVE
Blood in Urine, dipstick: NEGATIVE
Glucose, Urine, Semiquant: NEGATIVE
Ketones, urine, test strip: NEGATIVE
Nitrite: NEGATIVE
Protein, U semiquant: NEGATIVE
Specific Gravity, Urine: <1.005
Urobilinogen, UA: 0.2
WBC Urine, dipstick: NEGATIVE
pH: 5

## 2009-11-19 ENCOUNTER — Telehealth: Payer: Self-pay | Admitting: Internal Medicine

## 2009-12-23 ENCOUNTER — Telehealth: Payer: Self-pay | Admitting: Internal Medicine

## 2010-02-14 ENCOUNTER — Telehealth: Payer: Self-pay | Admitting: Internal Medicine

## 2010-02-14 ENCOUNTER — Ambulatory Visit: Payer: Self-pay | Admitting: Internal Medicine

## 2010-02-14 LAB — CONVERTED CEMR LAB
ALT: 25 units/L (ref 0–35)
AST: 27 units/L (ref 0–37)
Albumin: 4.1 g/dL (ref 3.5–5.2)
Alkaline Phosphatase: 93 units/L (ref 39–117)
BUN: 12 mg/dL (ref 6–23)
Basophils Absolute: 0 10*3/uL (ref 0.0–0.1)
Basophils Relative: 0.8 % (ref 0.0–3.0)
Bilirubin Urine: NEGATIVE
Bilirubin, Direct: 0.1 mg/dL (ref 0.0–0.3)
CO2: 25 meq/L (ref 19–32)
Calcium: 9.2 mg/dL (ref 8.4–10.5)
Chloride: 101 meq/L (ref 96–112)
Cholesterol: 155 mg/dL (ref 0–200)
Creatinine, Ser: 0.8 mg/dL (ref 0.4–1.2)
Eosinophils Absolute: 0.2 10*3/uL (ref 0.0–0.7)
Eosinophils Relative: 4.7 % (ref 0.0–5.0)
Free T4: 1.21 ng/dL (ref 0.60–1.60)
GFR calc non Af Amer: 81.13 mL/min (ref >60)
Glucose, Bld: 80 mg/dL (ref 70–99)
HCT: 42.3 % (ref 36.0–46.0)
HDL: 49.1 mg/dL (ref >39.00)
Hemoglobin, Urine: NEGATIVE
Hemoglobin: 14.7 g/dL (ref 12.0–15.0)
Ketones, ur: NEGATIVE mg/dL
LDL Cholesterol: 94 mg/dL (ref 0–99)
Leukocytes, UA: NEGATIVE
Lymphocytes Relative: 29.7 % (ref 12.0–46.0)
Lymphs Abs: 1.5 10*3/uL (ref 0.7–4.0)
MCHC: 34.8 g/dL (ref 30.0–36.0)
MCV: 93.7 fL (ref 78.0–100.0)
Monocytes Absolute: 0.3 10*3/uL (ref 0.1–1.0)
Monocytes Relative: 5.8 % (ref 3.0–12.0)
Neutro Abs: 3 10*3/uL (ref 1.4–7.7)
Neutrophils Relative %: 59 % (ref 43.0–77.0)
Nitrite: NEGATIVE
Platelets: 205 10*3/uL (ref 150.0–400.0)
Potassium: 4.2 meq/L (ref 3.5–5.1)
RBC: 4.51 M/uL (ref 3.87–5.11)
RDW: 13.3 % (ref 11.5–14.6)
Sodium: 137 meq/L (ref 135–145)
Specific Gravity, Urine: <=1.005 (ref 1.000–1.030)
T3, Free: 3.2 pg/mL (ref 2.3–4.2)
TSH: 0.23 microintl units/mL — ABNORMAL LOW (ref 0.35–5.50)
Total Bilirubin: 0.6 mg/dL (ref 0.3–1.2)
Total CHOL/HDL Ratio: 3
Total Protein, Urine: NEGATIVE mg/dL
Total Protein: 7.1 g/dL (ref 6.0–8.3)
Triglycerides: 61 mg/dL (ref 0.0–149.0)
Urine Glucose: NEGATIVE mg/dL
Urobilinogen, UA: 0.2 (ref 0.0–1.0)
VLDL: 12.2 mg/dL (ref 0.0–40.0)
WBC: 5.2 10*3/uL (ref 4.5–10.5)
pH: 6.5 (ref 5.0–8.0)

## 2010-02-21 ENCOUNTER — Ambulatory Visit: Payer: Self-pay | Admitting: Internal Medicine

## 2010-02-21 ENCOUNTER — Encounter: Payer: Self-pay | Admitting: Internal Medicine

## 2010-02-21 DIAGNOSIS — E041 Nontoxic single thyroid nodule: Secondary | ICD-10-CM | POA: Insufficient documentation

## 2010-02-21 DIAGNOSIS — M542 Cervicalgia: Secondary | ICD-10-CM | POA: Insufficient documentation

## 2010-02-21 DIAGNOSIS — R600 Localized edema: Secondary | ICD-10-CM | POA: Insufficient documentation

## 2010-03-07 ENCOUNTER — Ambulatory Visit (HOSPITAL_COMMUNITY): Admission: RE | Admit: 2010-03-07 | Discharge: 2010-03-07 | Payer: Self-pay | Admitting: Internal Medicine

## 2010-03-22 ENCOUNTER — Ambulatory Visit: Payer: Self-pay | Admitting: Internal Medicine

## 2010-03-22 LAB — CONVERTED CEMR LAB
BUN: 15 mg/dL (ref 6–23)
CO2: 29 meq/L (ref 19–32)
Calcium: 9.3 mg/dL (ref 8.4–10.5)
Chloride: 102 meq/L (ref 96–112)
Creatinine, Ser: 1.1 mg/dL (ref 0.4–1.2)
GFR calc non Af Amer: 53.11 mL/min (ref >60)
Glucose, Bld: 82 mg/dL (ref 70–99)
Potassium: 4 meq/L (ref 3.5–5.1)
Sodium: 140 meq/L (ref 135–145)
TSH: 0.5 microintl units/mL (ref 0.35–5.50)

## 2010-05-08 ENCOUNTER — Emergency Department (HOSPITAL_COMMUNITY)
Admission: EM | Admit: 2010-05-08 | Discharge: 2010-05-08 | Payer: Self-pay | Source: Home / Self Care | Admitting: Family Medicine

## 2010-05-16 ENCOUNTER — Encounter: Payer: Self-pay | Admitting: Obstetrics and Gynecology

## 2010-05-24 NOTE — Assessment & Plan Note (Signed)
Summary: np6/chest pain eval for stress test/pt saw seen in ed  Medications Added KLOR-CON 10 10 MEQ CR-TABS (POTASSIUM CHLORIDE) 1 tab by mouth once daily CLARITIN 10 MG TABS (LORATADINE) as needed CHLORHEXIDINE GLUCONATE 0.12 % SOLN (CHLORHEXIDINE GLUCONATE) as directed Mouth wash        CC:  fatigue .  History of Present Illness: 55 yo female for evaluation of chest pain. Patient was admitted to the hospital in October after an episode of chest pain at the dentist office. It was substernal and radiated to the neck and described as a pressure. It lasted one hour and resolved by her report. The pain was not pleuritic or positional nor is it related to food. He was not clearly exertional. She ruled out for myocardial infarction with serial enzymes. She suddenly had an outpatient Myoview that showed normal perfusion and normal LV function. She's had no chest pain since then. She denies any significant dyspnea on exertion, orthopnea, PND but she does have pedal edema. She also has had problems with fatigue since then. Because of the above we were asked to further evaluate. Note she has not had any bleeding.  Current Medications (verified): 1)  Furosemide 20 Mg Tabs (Furosemide) .... Take 1 Tablet By Mouth Once A Day 2)  Levothyroxine Sodium 100 Mcg Tabs (Levothyroxine Sodium) .... Take 1 Tablet By Mouth Once A  Day 3)  Protonix 40 Mg  Tbec (Pantoprazole Sodium) .[name].. 1 By Mouth Once Daily 4)  Alprazolam 0.25 Mg  Tabs (Alprazolam) .[name].. 1 By Mouth Two Times A Day As Needed 5)  Klor-Con 10 10 Meq Cr-Tabs (Potassium Chloride) .[name].. 1 Tab By Mouth Once Daily 6)  Claritin 10 Mg Tabs (Loratadine) .... As Needed 7)  Chlorhexidine Gluconate 0.12 % Soln (Chlorhexidine Gluconate) .... As Directed Mouth Wash  Allergies: 1)  ! Aspirin 2)  ! Sulfa 3)  ! Amoxicillin 4)  ! * Avelox 5)  ! * Latex  Past History:  Past Medical History: Allergic rhinitis Anxiety Colonic polyps, hx of -  adenomatous Hypothyroidism lumbar disc disease Diverticulosis, colon Nephrolithiasis  Past Surgical History: Reviewed history from 04/14/2008 and no changes required. Hysterectomy Cholecystectomy ovary cyst 2006 s/p left breast biopsy neg 8/09  Family History: Reviewed history from 07/19/2007 and no changes required. mother with dementia mother and father with dementia No premature CAD in immediate family  Social History: Reviewed history from 07/19/2007 and no changes required. 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 Remaining systems are negative.   Vital Signs:  Patient profile:   55 year old female Height:      63 inches Weight:      190 pounds BMI:     33.78 Pulse rate:   88 / minute Resp:     14 per minute BP sitting:   101 / 67  (left arm)  Vitals Entered By: Burnett Kanaris (March 11, 2009 2:22 PM)  Physical Exam  General:  Well developed/well nourished in NAD Skin warm/dry Patient not depressed No peripheral clubbing Back-normal HEENT-normal/normal eyelids Neck supple/normal carotid upstroke bilaterally; no bruits; no JVD; no thyromegaly chest - CTA/ normal expansion CV - RRR/normal S1 and S2; no murmurs, rubs or gallops; PMI nondisplaced Abdomen -NT/ND, no  HSM, no mass, + bowel sounds, no bruit 2+ femoral pulses, no bruits Ext-no edema, chords, 2+ DP; varicosities noted Neuro-grossly nonfocal     EKG  Procedure date:  02/08/2009  Findings:      Sinus rhythm with normal axis and minor nonspecific ST changes.  Impression & Recommendations:  Problem # 1:  FATIGUE (ICD-780.79) Etiology unclear. Check CBC and TSH. Orders: TLB-CBC  Platelet - w/Differential (85025-CBCD) TLB-TSH (Thyroid Stimulating Hormone) (84443-TSH)  Problem # 2:  CHEST PAIN (ICD-786.50) Symptoms atypical. Myoview normal. No further cardiac workup.  Problem # 3:  HYPOTHYROIDISM (ICD-244.9)  Her updated medication list for this problem includes:    Levothyroxine Sodium 100 Mcg Tabs (Levothyroxine sodium) .[name]... Take 1 tablet by mouth once a  day  Problem # 4:  ANXIETY (ICD-300.00)

## 2010-05-24 NOTE — Progress Notes (Signed)
Summary: Add On  ---- Converted from flag ---- Add on slip faxed to lab  ---- 02/14/2010 11:55 AM, Biagio Borg MD wrote: ok - [phone]  ---- 02/14/2010 11:11 AM, Crissie Sickles, CMA wrote: Pt wants to Free T4 and Free T3 added to her CPX labs drawn today due to an abnormality when she had her [facility] screening done. ------------------------------

## 2010-05-24 NOTE — Miscellaneous (Signed)
Summary: Orders Update   Clinical Lists Changes  Problems: Added new problem of RADIOLOGICAL EXAMINATION NEC (ICD-V72.5) Orders: Added new Test order of TLB-BUN (Urea Nitrogen) (84520-BUN) - Signed Added new Test order of T-Creatinine Blood [id number]) - Signed Added new Referral order of Radiology Referral (Radiology) - Signed  Appended Document: Orders Update [name] - to call pt to inform she is due for her 3 mo CT chest, and needs to have the BUN/cr done prior   Appended Document: Orders Update Per EMR, Messg left for pt to return call by [facility]  Appended Document: Orders Update It is noted pt did not comply with request for bun/cr or f/u ct chest

## 2010-05-24 NOTE — Assessment & Plan Note (Signed)
Summary: DR Jenny Reichmann PT/ EARACHE [name]   Vital Signs:  Patient Profile:   55 Years Old Female Weight:      187 pounds Temp:     97.2 degrees F oral Pulse rate:   88 / minute BP sitting:   124 / 82  (right arm) Cuff size:   regular  Vitals Entered By: Estell Harpin CMA (May 12, 2008 9:29 AM)                 Chief Complaint:  nausea, dizziness, and Bilateral ear tightness.  History of Present Illness: I am seeing this pt. for the first time today as she complains of a 2 day hx. of recurrent inner ear symptoms with dizziness, ear popping and pressure, nasal congestion, runny nose, post-nasal drip, and nausea.  Acute Visit History:      The patient complains of earache and nausea.  She denies chest pain, cough, eye symptoms, fever, headache, nasal discharge, rash, sinus problems, sore throat, and vomiting.        The earache is located on both sides.  There is no history of recent antibiotic usage, cold/URI symptoms, or recurrent otitis media associated with the earache.           Prior Medications Reviewed Using: Patient Recall  Updated Prior Medication List: FUROSEMIDE 20 MG TABS (FUROSEMIDE) Take 1 tablet by mouth once a day LEVOTHYROXINE SODIUM 100 MCG TABS (LEVOTHYROXINE SODIUM) Take 1 tablet by mouth once a  day PROTONIX 40 MG  TBEC (PANTOPRAZOLE SODIUM) 1 by mouth once daily ALPRAZOLAM 0.25 MG  TABS (ALPRAZOLAM) 1 by mouth two times a day as needed MECLIZINE HCL 25 MG TABS (MECLIZINE HCL)  * CALCIUM  * POTASSIUM  FLOMAX 0.4 MG XR24H-CAP (TAMSULOSIN HCL) 1po once daily BACLOFEN 10 MG TABS (BACLOFEN) 1/2 by mouth two times a day OXYCODONE-ACETAMINOPHEN 5-325 MG TABS (OXYCODONE-ACETAMINOPHEN) 1po q 6hrs as needed MORPHINE SULFATE 15 MG TABS (MORPHINE SULFATE) Take 1 tablet by mouth once a day as needed  Current Allergies (reviewed today): ! ASPIRIN ! SULFA ! AMOXICILLIN ! [name]  Past Medical History:    Reviewed history from 07/19/2007 and no changes  required:       Allergic rhinitis       Anxiety       Colonic polyps, hx of - adenomatous       Hypothyroidism       lumbar disc disease       Diverticulosis, colon  Past Surgical History:    Reviewed history from 04/14/2008 and no changes required:       Hysterectomy       Cholecystectomy       ovary cyst 2006       s/p left breast biopsy neg 8/09   Family History:    Reviewed history from 07/19/2007 and no changes required:       mother with dementia       mother and father with dementia  Social History:    Reviewed history from 07/19/2007 and no changes required:       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    Neuro      Denies brief paralysis, difficulty with concentration, disturbances in coordination, falling down, headaches, inability to speak, memory loss, numbness, poor balance, seizures, sensation of room spinning, tingling, tremors, visual disturbances, and weakness.   Physical Exam  General:     alert, well-developed, well-nourished, well-hydrated, appropriate dress, normal appearance, and healthy-appearing.   Head:     Normocephalic and atraumatic without obvious abnormalities. No apparent alopecia or balding. Eyes:     No corneal or conjunctival inflammation noted. EOMI. Perrla. Funduscopic exam benign, without hemorrhages, exudates or papilledema. Vision grossly normal. Ears:     External ear exam shows no significant lesions or deformities.  Otoscopic examination reveals clear canals, tympanic membranes are intact bilaterally without bulging, retraction, inflammation or discharge.  Hearing is grossly normal bilaterally. Nose:     no airflow obstruction, no intranasal foreign body, no nasal polyps, no nasal mucosal lesions, no mucosal friability, no active bleeding or clots, no sinus percussion tenderness, no septum abnormalities, nasal dischargemucosal pallor, and mucosal edema.   Mouth:     Oral mucosa and oropharynx without lesions or exudates.  Teeth in good repair. Neck:     supple, full ROM, and no masses.   Lungs:     Normal respiratory effort, chest expands symmetrically. Lungs are clear to auscultation, no crackles or wheezes. Heart:     Normal rate and regular rhythm. S1 and S2 normal without gallop, murmur, click, rub or other extra sounds. Abdomen:     Bowel sounds positive,abdomen soft and non-tender without masses, organomegaly or hernias noted. Msk:     No deformity or scoliosis noted of thoracic or lumbar spine.   Pulses:     R and L carotid,radial,femoral,dorsalis pedis and posterior tibial pulses are full and equal bilaterally Extremities:     No clubbing, cyanosis, edema, or deformity noted with normal full range of motion of all joints.   Neurologic:     No cranial nerve deficits noted. Station and gait are normal. Plantar reflexes are down-going bilaterally. DTRs are symmetrical throughout. Sensory, motor and coordinative functions appear intact. Skin:     Intact without suspicious lesions or rashes Cervical Nodes:     No lymphadenopathy noted Axillary Nodes:     No palpable lymphadenopathy Psych:     Oriented X3, memory intact for recent and remote, normally interactive, good eye contact, not anxious appearing, not depressed appearing, not agitated, and subdued.      Impression & Recommendations:  Problem # 1:  ALLERGIC RHINITIS (ICD-477.9) Assessment: Deteriorated  Her updated medication list for this problem includes:    Omnaris 50 Mcg/act Susp (Ciclesonide) .[name]... 2 puffs each nostril once daily   Problem # 2:  LABRYNTHITIS  (ICD-386.30) Assessment: New  Givve Depo-medrol  today and start Omnaris. Continue Claritin-D and Meclizine. Orders: Depo- Medrol 80mg  (J1040) Depo- Medrol 40mg  (J1030) Admin of Therapeutic Inj  intramuscular or subcutaneous [id number])   Complete Medication List: 1)  Furosemide 20 Mg Tabs (Furosemide) .... Take 1 tablet by mouth once a day 2)  Levothyroxine Sodium 100 Mcg Tabs (Levothyroxine sodium) .... Take 1 tablet by mouth once a  day 3)  Protonix 40 Mg Tbec (Pantoprazole sodium) .[name].. 1 by mouth once daily 4)  Alprazolam 0.25 Mg Tabs (Alprazolam) .[name].. 1 by mouth two times a day as needed  5)  Meclizine Hcl 25 Mg Tabs (Meclizine hcl) 6)  Calcium  7)  Potassium  8)  Flomax 0.4 Mg Xr24h-cap (Tamsulosin hcl) .[name].. 1po once daily 9)  Baclofen 10 Mg Tabs (Baclofen) .... 1/2 by mouth two times a day 10)  Oxycodone-acetaminophen 5-325 Mg Tabs (Oxycodone-acetaminophen) .[name].. 1po q 6hrs as needed 11)  Morphine Sulfate 15 Mg Tabs (Morphine sulfate) .... Take 1 tablet by mouth once a day as needed 12)  Omnaris 50 Mcg/act Susp (Ciclesonide) .... 2 puffs each nostril once daily   Patient Instructions: 1)  Please schedule a follow-up appointment in 2 weeks. 2)  Get plenty of rest, drink lots of clear liquids, and use Tylenol or Ibuprofen for fever and comfort. Return in 7-10 days if you're not better:sooner if you're feeling worse.   Prescriptions: OMNARIS 50 MCG/ACT SUSP (CICLESONIDE) 2 puffs each nostril once daily  #2 inhs x 0   Entered and Authorized by:   Janith Lima MD   Signed by:   Janith Lima MD on 05/12/2008   Method used:   Historical   [id number]    Medication Administration  Injection # 1:    Medication: Depo- Medrol 80mg     Diagnosis: LABRYNTHITIS (ICD-386.30)    Route: IM    Site: [id number] gluteus    Exp Date: 02/2009    Lot #: [id number]    Mfr: sicor    Patient tolerated injection without complications    Given by: Estell Harpin CMA (May 12, 2008  10:30 AM)  Injection # 2:    Medication: Depo- Medrol 40mg     Diagnosis: LABRYNTHITIS (ICD-386.30)    Route: IM    Site: RUOQ gluteus    Exp Date: 02/2009    Lot #: [id number]    Mfr: sicor    Patient tolerated injection without complications    Given by: Estell Harpin CMA (May 12, 2008 10:30 AM)  Orders Added: 1)  Est. Patient Level IV [id number] 2)  Depo- Medrol 80mg  [J1040] 3)  Depo- Medrol 40mg  [J1030] 4)  Admin of Therapeutic Inj  intramuscular or subcutaneous [id number]

## 2010-05-24 NOTE — Progress Notes (Signed)
Summary: CT CHEST F/U   Phone Note Call from Patient Call back at Work Phone [phone]   Summary of Call: Pt was called about CT of sinuses. She would also like order for f/u ct chest b/c of previous nodules.  Initial call taken by: Charlsie Quest,  December 03, 2008 1:34 PM  Follow-up for Phone Call        no further ct chest needed per radiologist at last CT;  ct sinus not yet available Follow-up by: Biagio Borg MD,  December 03, 2008 1:46 PM  Additional Follow-up for Phone Call Additional follow up Details #1::        Pt informed  Additional Follow-up by: Charlsie Quest,  December 03, 2008 4:28 PM

## 2010-05-24 NOTE — Assessment & Plan Note (Signed)
Summary: per triage b/ f/u labs/cd   Vital Signs:  Patient Profile:   55 Years Old Female Weight:      192 pounds O2 Sat:      97 % O2 treatment:    Room Air Temp:     97.4 degrees F oral Pulse rate:   103 / minute BP sitting:   132 / 80  (left arm) Cuff size:   regular  Vitals Entered By: Sherlean Foot CMA (April 14, 2008 4:05 PM)                 Preventive Care Screening  Mammogram:    Date:  10/23/2007    Next Due:  04/2008    Results:  abnormal left   Last Flu Shot:    Date:  02/25/2008    Results:  given      had the swine flu shot earlier this seaon 2009   Chief Complaint:  f/u on labs/other issues.  History of Present Illness: overall doing well, no speciic complaints except incidently has increaed right LBP today with some radiation to the right thigh and groin area - followed per dr Nelva Bush and recently started on baclofen for this    Updated Prior Medication List: FUROSEMIDE 20 MG TABS (FUROSEMIDE) Take 1 tablet by mouth once a day LEVOTHYROXINE SODIUM 100 MCG TABS (LEVOTHYROXINE SODIUM) Take 1 tablet by mouth once a  day PROTONIX 40 MG  TBEC (PANTOPRAZOLE SODIUM) 1 by mouth once daily ALPRAZOLAM 0.25 MG  TABS (ALPRAZOLAM) 1 by mouth two times a day as needed MECLIZINE HCL 25 MG TABS (MECLIZINE HCL)  * CALCIUM  * POTASSIUM  FLOMAX 0.4 MG XR24H-CAP (TAMSULOSIN HCL) 1po once daily BACLOFEN 10 MG TABS (BACLOFEN) 1/2 by mouth two times a day OXYCODONE-ACETAMINOPHEN 5-325 MG TABS (OXYCODONE-ACETAMINOPHEN) 1po q 6hrs as needed  Current Allergies (reviewed today): ! ASPIRIN ! SULFA ! AMOXICILLIN ! [name]  Past Medical History:    Reviewed history from 07/19/2007 and no changes required:       Allergic rhinitis       Anxiety       Colonic polyps, hx of - adenomatous       Hypothyroidism       lumbar disc disease       Diverticulosis, colon  Past Surgical History:    Reviewed history from 07/19/2007 and no changes required:  Hysterectomy       Cholecystectomy       ovary cyst 2006       s/p left breast biopsy neg 8/09   Family History:    Reviewed history from 07/19/2007 and no changes required:       mother with dementia       mother and father with dementia  Social History:    Reviewed history from 07/19/2007 and no changes required:       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, arts and phlebotomy       Married       3 children       Former Smoker       Alcohol use-no       daughter moved back in   Risk Factors:  Mammogram History:     Date of Last Mammogram:  10/23/2007    Results:  abnormal left    Review of Systems  The patient denies anorexia, fever, weight loss, weight gain, vision loss, decreased hearing, hoarseness, chest pain, syncope, dyspnea on exertion, peripheral edema, prolonged cough, headaches,  hemoptysis, abdominal pain, melena, hematochezia, severe indigestion/heartburn, hematuria, incontinence, muscle weakness, suspicious skin lesions, transient blindness, difficulty walking, depression, unusual weight change, abnormal bleeding, enlarged lymph nodes, angioedema, and breast masses.         all otherwise negative    Physical Exam  General:     alert and overweight-appearing.   Head:     Normocephalic and atraumatic without obvious abnormalities. No apparent alopecia or balding. Eyes:     No corneal or conjunctival inflammation noted. EOMI. Perrla.  Ears:     External ear exam shows no significant lesions or deformities.  Otoscopic examination reveals clear canals, tympanic membranes are intact bilaterally without bulging, retraction, inflammation or discharge. Hearing is grossly normal bilaterally. Nose:     External nasal examination shows no deformity or inflammation. Nasal mucosa are pink and moist without lesions or exudates. Mouth:     Oral mucosa and oropharynx without lesions or exudates.  Teeth in good repair. Neck:     No deformities, masses, or tenderness noted. Lungs:      Normal respiratory effort, chest expands symmetrically. Lungs are clear to auscultation, no crackles or wheezes. Heart:     Normal rate and regular rhythm. S1 and S2 normal without gallop, murmur, click, rub or other extra sounds. Abdomen:     Bowel sounds positive,abdomen soft and non-tender without masses, organomegaly or hernias noted. Msk:     no joint tenderness and no joint swelling.   Extremities:     no edema, no ulcers  Neurologic:     cranial nerves II-XII intact and strength normal in all extremities.      Impression & Recommendations:  Problem # 1:  Preventive Health Care (ICD-V70.0) Overall doing well, up to date, counseled on routine health concerns for screening and prevention, immunizations up to date or declined, labs reviewed    Problem # 2:  BACK PAIN (ICD-724.5)  Her updated medication list for this problem includes:    Baclofen 10 Mg Tabs (Baclofen) .[name]... 1/2 by mouth two times a day    Oxycodone-acetaminophen 5-325 Mg Tabs (Oxycodone-acetaminophen) .[name]... 1po q 6hrs as needed Continue all medications that you may have been taking previously - to f/u dr Nelva Bush as planned  Problem # 3:  HYPOTHYROIDISM (ICD-244.9)  Her updated medication list for this problem includes:    Levothyroxine Sodium 100 Mcg Tabs (Levothyroxine sodium) .[name]... Take 1 tablet by mouth once a  day  Labs Reviewed: TSH: 1.42 (04/08/2008)    Chol: 166 (04/08/2008)   HDL: 49.0 (04/08/2008)   LDL: 99 (04/08/2008)   TG: 88 (04/08/2008) stable overall by hx and exam, ok to continue meds/tx as is   Complete Medication List: 1)  Furosemide 20 Mg Tabs (Furosemide) .... Take 1 tablet by mouth once a day 2)  Levothyroxine Sodium 100 Mcg Tabs (Levothyroxine sodium) .... Take 1 tablet by mouth once a  day 3)  Protonix 40 Mg Tbec (Pantoprazole sodium) .[name].. 1 by mouth once daily 4)  Alprazolam 0.25 Mg Tabs (Alprazolam) .[name].. 1 by mouth two times a day as needed 5)  Meclizine Hcl 25 Mg Tabs (Meclizine  hcl) 6)  Calcium  7)  Potassium  8)  Flomax 0.4 Mg Xr24h-cap (Tamsulosin hcl) .[name].. 1po once daily 9)  Baclofen 10 Mg Tabs (Baclofen) .... 1/2 by mouth two times a day 10)  Oxycodone-acetaminophen 5-325 Mg Tabs (Oxycodone-acetaminophen) .[name].. 1po q 6hrs as needed   Patient Instructions: 1)  you recieved the tetanus shot today 2)  Continue all medications that you may have been taking previously please continue to see dr Mliss Fritz and dr Jeffie Pollock [id number] 3)  Please schedule a follow-up appointment in 1 year. or sooner if needed   Prescriptions: ALPRAZOLAM 0.25 MG  TABS (ALPRAZOLAM) 1 by mouth two times a day as needed  #60 x 2   Entered and Authorized by:   Biagio Borg MD   Signed by:   Biagio Borg MD on 04/14/2008   Method used:   Print then Give to Patient   RxID:   [id number] [place] 40 MG  TBEC (PANTOPRAZOLE SODIUM) 1 by mouth once daily  #90 x 3   Entered and Authorized by:   Biagio Borg MD   Signed by:   Biagio Borg MD on 04/14/2008   Method used:   Electronically to        [facility] [phone]* (retail)       [address]       [place], [place]  [address]       Ph: [phone]       Fax: [phone]   [id number] FUROSEMIDE 20 MG TABS (FUROSEMIDE) Take 1 tablet by mouth once a day  #90 x 3   Entered and Authorized by:   Biagio Borg MD   Signed by:   Biagio Borg MD on 04/14/2008   Method used:   Electronically to        [facility] [phone]* (retail)       [place], [place]  [address]       Ph: [phone]       Fax: [phone]   [id number] LEVOTHYROXINE SODIUM 100 MCG TABS (LEVOTHYROXINE SODIUM) Take 1 tablet by mouth once a  day  #90 x 3   Entered and Authorized by:   Biagio Borg MD   Signed by:   Biagio Borg MD on 04/14/2008   Method used:   Electronically to        [facility] [phone]* (retail)       [address]       [place], [place]  [address]       Ph: [phone]       Fax:  [phone]   [id number]  ]  Appended Document: per triage b/ f/u labs/cd     Nurse Visit    Prior Medications: LEVOTHYROXINE SODIUM 100 MCG TABS (LEVOTHYROXINE SODIUM) Take 1 tablet by mouth once a  day ALPRAZOLAM 0.25 MG  TABS (ALPRAZOLAM) 1 by mouth two times a day as needed MECLIZINE HCL 25 MG TABS (MECLIZINE HCL)  CALCIUM ()  POTASSIUM ()  Current Allergies: ! ASPIRIN ! SULFA ! AMOXICILLIN ! * AVELOX   Tetanus/Td Vaccine    Vaccine Type: Tdap    Site: right deltoid    Mfr: Gl[employer]oSmithK[employer]    Dose: 0.5 ml    Route: IM    Given by: Sherlean Foot CMA    Exp. Date: 03/28/2010    Lot #: [id number]    VIS given: 03/12/07 version given April 15, 2008.   Orders Added: 1)  Tdap => 73yrs IM [id number] 2)  Admin 1st Vaccine [email or web address]    ]

## 2010-05-24 NOTE — Assessment & Plan Note (Signed)
Summary: ear pain/jss   Vital Signs:  Patient profile:   55 year old female Height:      63.5 inches Weight:      189 pounds BMI:     33.07 O2 Sat:      97 % Temp:     98.0 degrees F oral Pulse rate:   78 / minute BP sitting:   112 / 64  (right arm) Cuff size:   regular  Vitals Entered By: Hector Brunswick (November 27, 2008 3:55 PM) CC: ear infection Comments not taking azithromycn   CC:  ear infection.  History of Present Illness: here wtih acute onset right ear pain adn tenderness to the pinna wtih redness for several days; cant sleep on the right side, some low grade temp noted and malaise; also withongoing sinus congestion and discomfort with yellowish ? greenish d/c but mostly chronis issue for 6 months she just cant seem to shake and lately with bloody d/c;  does have some ear popping and crackling as wel and overall good complaince with meds for allergies; no veritgo , dizziness or falls, no bad taste in the throat and little overall cough; no wheezing, sob, doe, orthopnea, pnd or LE edema  Problems Prior to Update: 1)  Sinusitis, Chronic  (ICD-473.9) 2)  Cellulitis  (ICD-682.9) 3)  Labrynthitis  (ICD-386.30) 4)  Back Pain  (ICD-724.5) 5)  Preventive Health Care  (ICD-V70.0) 6)  Sinusitis- Acute-nos  (ICD-461.9) 7)  Radiological Examination Nec  (ICD-V72.5) 8)  Diverticulosis, Colon  (ICD-562.10) 9)  Hypothyroidism  (ICD-244.9) 10)  Colonic Polyps, Hx of  (ICD-V12.72) 11)  Anxiety  (ICD-300.00) 12)  Ct, Chest, Abnormal  (ICD-794.2) 13)  Chest Pain  (ICD-786.50) 14)  Degenerative Disc Disease  (ICD-722.6) 15)  Urinary Retention  (ICD-788.20) 16)  Allergic Rhinitis  (ICD-477.9)  Medications Prior to Update: 1)  Furosemide 20 Mg Tabs (Furosemide) .... Take 1 Tablet By Mouth Once A Day 2)  Levothyroxine Sodium 100 Mcg Tabs (Levothyroxine Sodium) .... Take 1 Tablet By Mouth Once A  Day 3)  Protonix 40 Mg  Tbec (Pantoprazole Sodium) .[name].. 1 By Mouth Once Daily 4)  Alprazolam  0.25 Mg  Tabs (Alprazolam) .[name].. 1 By Mouth Two Times A Day As Needed 5)  Calcium 6)  Potassium 7)  Omnaris 50 Mcg/act Susp (Ciclesonide) .... 2 Puffs Each Nostril Once Daily 8)  Azithromycin 500 Mg Tabs (Azithromycin) .... Qd  Current Medications (verified): 1)  Furosemide 20 Mg Tabs (Furosemide) .... Take 1 Tablet By Mouth Once A Day 2)  Levothyroxine Sodium 100 Mcg Tabs (Levothyroxine Sodium) .... Take 1 Tablet By Mouth Once A  Day 3)  Protonix 40 Mg  Tbec (Pantoprazole Sodium) .[name].. 1 By Mouth Once Daily 4)  Alprazolam 0.25 Mg  Tabs (Alprazolam) .[name].. 1 By Mouth Two Times A Day As Needed 5)  Calcium 6)  Potassium 7)  Omnaris 50 Mcg/act Susp (Ciclesonide) .... 2 Puffs Each Nostril Once Daily 8)  Doxycycline Hyclate 100 Mg Caps (Doxycycline Hyclate) .[name].. 1 By Mouth Two Times A Day  Allergies: 1)  ! Aspirin 2)  ! Sulfa 3)  ! Amoxicillin 4)  ! * Avelox 5)  ! 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  , mild ill Head:  normocephalic and atraumatic.   Eyes:  vision grossly intact, pupils equal, and pupils round.   Ears:  right pinna mild to mod erythema, tender and swselling, with right canal with mild erythema as well; right TM with mild erythema; left pinna and canal normal appearance, left TM mild erythema but minimal Nose:  nasal  dischargemucosal pallor and mucosal erythema.   Mouth:  pharyngeal erythema and fair dentition.   Neck:  supple and no masses.   Lungs:  normal respiratory effort and normal breath sounds.   Heart:  normal rate and regular rhythm.   Extremities:  no edema, no ulcers    Impression & Recommendations:  Problem # 1:  CELLULITIS (ICD-682.9)  Her updated medication list for this problem includes:    Doxycycline Hyclate 100 Mg Caps (Doxycycline hyclate) .[name]... 1 by mouth two times a day right pinna - for doxycycline course  Problem # 2:  SINUSITIS, CHRONIC (ICD-473.9)  Her updated medication list for this problem includes:    Omnaris 50 Mcg/act Susp (Ciclesonide) .[name]... 2 puffs each nostril once daily    Doxycycline Hyclate 100 Mg Caps (Doxycycline hyclate) .[name]... 1 by mouth two times a day with recent bloody d/c worsening  - for CT sinus, and refer ENT - chris newman/md per pt request  Orders: Radiology Referral (Radiology) ENT Referral (ENT)  Complete Medication List: 1)  Furosemide 20 Mg Tabs (Furosemide) .... Take 1 tablet by mouth once a day 2)  Levothyroxine Sodium 100 Mcg Tabs (Levothyroxine sodium) .... Take 1 tablet by mouth once a  day 3)  Protonix 40 Mg Tbec (Pantoprazole sodium) .[name].. 1 by mouth once daily 4)  Alprazolam 0.25 Mg Tabs (Alprazolam) .[name].. 1 by mouth two times a day as needed 5)  Calcium  6)  Potassium  7)  Omnaris 50 Mcg/act Susp (Ciclesonide) .... 2 puffs each nostril once daily 8)  Doxycycline Hyclate 100 Mg Caps (Doxycycline hyclate) .[name].. 1 by mouth two times a day  Patient Instructions: 1)  Please take all new medications as prescribed 2)  Continue all medications that you may have been taking previously  3)  You will be contacted about the referral(s) to: CT sinus, and the referral to Dr Gerald Stabs Newman/ENT 4)  Please schedule a follow-up appointment as needed. Prescriptions: DOXYCYCLINE HYCLATE 100 MG CAPS (DOXYCYCLINE HYCLATE) 1 by mouth two times a day   #20 x 0   Entered and Authorized by:   Biagio Borg MD   Signed by:   Biagio Borg MD on 11/27/2008   Method used:   Print then Give to Patient   RxID:   [id number]

## 2010-05-24 NOTE — Assessment & Plan Note (Signed)
Summary: ear infection/[name]/lb   Vital Signs:  Patient profile:   55 year old female Height:      66 inches (167.64 cm) Weight:      188.50 pounds (85.68 kg) O2 Sat:      99 % Temp:     97.7 degrees F (36.50 degrees C) oral Pulse rate:   80 / minute BP sitting:   110 / 82  Vitals Entered By: Felipa Evener (June 23, 2009 3:20 PM) CC: Ear infection/dizziness/[place] Is Patient Diabetic? No Pain Assessment Patient in pain? no        Primary Care Provider:  Biagio Borg MD  CC:  Ear infection/dizziness/[name].  History of Present Illness: here today with complaint of ear pain R>L side. onset of symptoms was 4 days ago. course has been gradual onset and now occurs in worsening pattern (more pain each day). problem precipitated by ?seasonal allg symptom characterized as fullness in sides of head and behind ears -  problem associated with dizziness when turning over in bed (not when upright) but not associated with fever, drainage from ears, vision changes. symptoms improved by loratdine - but not resolved symptoms worsened with lying flat. + prior hx of same symptoms -but not in years.   Current Medications (verified): 1)  Furosemide 20 Mg Tabs (Furosemide) .... Take 1 Tablet By Mouth Once A Day 2)  Levothyroxine Sodium 100 Mcg Tabs (Levothyroxine Sodium) .... Take 1 Tablet By Mouth Once A  Day 3)  Protonix 40 Mg  Tbec (Pantoprazole Sodium) .[name].. 1 By Mouth Once Daily 4)  Alprazolam 0.25 Mg  Tabs (Alprazolam) .[name].. 1 By Mouth Two Times A Day As Needed 5)  Claritin 10 Mg Tabs (Loratadine) .... As Needed 6)  Chlorhexidine Gluconate 0.12 % Soln (Chlorhexidine Gluconate) .... As Directed Mouth Wash 7)  Multivitamins  Caps (Multiple Vitamin) .... Once Daily  Allergies (verified): 1)  ! Aspirin 2)  ! Sulfa 3)  ! Amoxicillin 4)  ! * Avelox 5)  ! * Latex  Past History:  Past Medical History: Reviewed history from 03/11/2009 and no changes required. Allergic  rhinitis Anxiety Colonic polyps, hx of - adenomatous Hypothyroidism lumbar disc disease Diverticulosis, colon Nephrolithiasis  Review of Systems       The patient complains of decreased hearing.  The patient denies fever, vision loss, hoarseness, chest pain, syncope, and headaches.    Physical Exam  General:  alert, well-developed, well-nourished, and cooperative to examination.    Eyes:  vision grossly intact; pupils equal, round and reactive to light.  conjunctiva and lids normal.    Ears:  R TM erythema, R TM bulging, L TM erythema, and L TM sclerotic.   Nose:  External nasal examination shows no deformity or inflammation. Nasal mucosa are pink and moist without lesions or exudates. Mouth:  teeth and gums in good repair; mucous membranes moist, without lesions or ulcers. oropharynx clear without exudate, mild erythema. +scant PND Lungs:  normal respiratory effort, no intercostal retractions or use of accessory muscles; normal breath sounds bilaterally - no crackles and no wheezes.    Heart:  normal rate, regular rhythm, no murmur, and no rub. BLE without edema. Neurologic:  alert & oriented X3 and cranial nerves II-XII symetrically intact.  strength normal in all extremities, sensation intact to light touch, and gait normal. speech fluent without dysarthria or aphasia; follows commands with good comprehension.    Impression & Recommendations:  Problem # 1:  OTITIS MEDIA (ICD-382.9)  Her updated  medication list for this problem includes:    Azithromycin 250 Mg Tabs (Azithromycin) .[name]... 2 tabs by mouth today, then 1 by mouth daily starting tomorrow  Instructed on prevention and treatment. Call if no improvement in 48-72 hours or sooner if worsening symptoms.   Orders: Prescription Created Electronically [phone])  Problem # 2:  ALLERGIC RHINITIS (ICD-477.9)  Her updated medication list for this problem includes:    Claritin 10 Mg Tabs (Loratadine) .[name]... As needed  Discussed  use of allergy medications and environmental measures.   Complete Medication List: 1)  Furosemide 20 Mg Tabs (Furosemide) .... Take 1 tablet by mouth once a day 2)  Levothyroxine Sodium 100 Mcg Tabs (Levothyroxine sodium) .... Take 1 tablet by mouth once a  day 3)  Protonix 40 Mg Tbec (Pantoprazole sodium) .[name].. 1 by mouth once daily 4)  Alprazolam 0.25 Mg Tabs (Alprazolam) .[name].. 1 by mouth two times a day as needed 5)  Claritin 10 Mg Tabs (Loratadine) .... As needed 6)  Chlorhexidine Gluconate 0.12 % Soln (Chlorhexidine gluconate) .... As directed mouth wash 7)  Multivitamins Caps (Multiple vitamin) .... Once daily 8)  Azithromycin 250 Mg Tabs (Azithromycin) .... 2 tabs by mouth today, then 1 by mouth daily starting tomorrow  Patient Instructions: 1)  it was good to see you today. 2)  antibiotic - Zpack - for your ear infection - your prescription has been electronically submitted to your pharmacy. Please take as directed. Contact our office if you believe you're having problems with the medication(s).  3)  take Claritin D 12hour each AM for next 7 days, then as needed (or return to use of plain loritadine when the congestion has improved) 4)  Get plenty of rest, drink lots of clear liquids, and use Tylenol or Ibuprofen for fever and comfort. Return in 7-10 days if you're not better:sooner if you're feeling worse. Prescriptions: AZITHROMYCIN 250 MG TABS (AZITHROMYCIN) 2 tabs by mouth today, then 1 by mouth daily starting tomorrow  #6 x 0   Entered and Authorized by:   Rowe Clack MD   Signed by:   Rowe Clack MD on 06/23/2009   Method used:   Electronically to        [facility] [phone]* (retail)       [address]       [place], [place]  [address]       Ph: [phone]       Fax: [phone]   RxID:   [id number]

## 2010-05-24 NOTE — Progress Notes (Signed)
Summary: Xanax  Phone Note Call from Patient   Caller: Patient [phone] Summary of Call: Pt called requesting refill of Xanax Initial call taken by: Crissie Sickles, Kimberly,  November 19, 2009 2:08 PM  Follow-up for Phone Call        done hardcopy to LIM side B - dahlia  Follow-up by: Biagio Borg MD,  November 19, 2009 3:28 PM  Additional Follow-up for Phone Call Additional follow up Details #1::        Pt informed Rx in cabinet for pt pick up Additional Follow-up by: Crissie Sickles, [place],  November 19, 2009 3:50 PM    New/Updated Medications: ALPRAZOLAM 0.25 MG  TABS (ALPRAZOLAM) 1 by mouth two times a day as needed Prescriptions: ALPRAZOLAM 0.25 MG  TABS (ALPRAZOLAM) 1 by mouth two times a day as needed  #60 x 1   Entered and Authorized by:   Biagio Borg MD   Signed by:   Biagio Borg MD on 11/19/2009   Method used:   Print then Give to Patient   RxID:   [id number]

## 2010-05-24 NOTE — Letter (Signed)
Summary: External Correspondence/[facility]  External Correspondence/[facility]   Imported By: Jerrye Noble D'jimraou 05/01/2007 15:04:16  _____________________________________________________________________  External Attachment:    Type:   Image     Comment:   External Document

## 2010-05-24 NOTE — Assessment & Plan Note (Signed)
Summary: cpx / umr / #/ cd   Vital Signs:  Patient profile:   55 year old female Height:      63 inches Weight:      187.50 pounds BMI:     33.33 O2 Sat:      99 % on Room air Temp:     97.9 degrees F oral Pulse rate:   73 / minute BP sitting:   108 / 70  (left arm) Cuff size:   regular  Vitals Entered By: Shirlean Mylar Ewing CMA Deborra Medina) (February 21, 2010 1:14 PM)  O2 Flow:  Room air  Preventive Care Screening  Mammogram:    Date:  08/23/2009    Next Due:  08/2010    Results:  normal      had sluf shot at hospital oct 10  CC: ADult Physical/RE   Primary Care Provider:  Biagio Borg MD  CC:  ADult Physical/RE.  History of Present Illness: here for f/u and wellness;  has ongoing stressors adn flat affect today but Denies worsening depressive symptoms, suicidal ideation, or panic.  Husband ill recently and daughter recnetly moved back in the house temporarily.  Has slight bilat LE sweling persistent despite good overall med compliance including the lasix.  Has several headaches recently but no fever, n/v/d or ST or cough.  Has also right neck pain with small radiation towards the right upper back recurrent over the past few wks, but no UE pain/weak/numb, change in bowel or bladder, gait change , fall, injury. No fever, wt loss, night sweats, loss of appetite or other constitutional symptoms Pt denies CP, worsening sob, doe, wheezing, orthopnea, pnd, worsening LE edema, palps, dizziness or syncope  Pt denies other new neuro symptoms such as facial or extremity weakness .  Pt denies polydipsia, polyuria.  Overall good compliance with meds, trying to follow low chol, diet, wt stable, little excercise however Pt states good ability with ADL's, low fall risk, home safety reviewed and adequate, no significant change in hearing or vision, trying to follow lower chol diet, and occasionally active only with regular excercise.   Problems Prior to Update: 1)  Edema  (ICD-782.3) 2)  Thyroid Nodule,  Right  (ICD-241.0) 3)  Atony of Bladder  (ICD-596.4) 4)  Uti  (ICD-599.0) 5)  Urinary Frequency  (ICD-788.41) 6)  Otitis Media  (ICD-382.9) 7)  Leukocytosis  (ICD-288.60) 8)  Fatigue  (ICD-780.79) 9)  Uri  (ICD-465.9) 10)  Chest Pain  (ICD-786.50) 11)  Sinusitis, Chronic  (ICD-473.9) 12)  Cellulitis  (ICD-682.9) 13)  Labrynthitis  (ICD-386.30) 14)  Back Pain  (ICD-724.5) 15)  Preventive Health Care  (ICD-V70.0) 16)  Sinusitis- Acute-nos  (ICD-461.9) 17)  Radiological Examination Nec  (ICD-V72.5) 18)  Diverticulosis, Colon  (ICD-562.10) 19)  Hypothyroidism  (ICD-244.9) 20)  Colonic Polyps, Hx of  (ICD-V12.72) 21)  Anxiety  (ICD-300.00) 22)  Ct, Chest, Abnormal  (ICD-794.2) 23)  Chest Pain  (ICD-786.50) 24)  Degenerative Disc Disease  (ICD-722.6) 25)  Urinary Retention  (ICD-788.20) 26)  Allergic Rhinitis  (ICD-477.9)  Medications Prior to Update: 1)  Furosemide 20 Mg Tabs (Furosemide) .... Take 1 Tablet By Mouth Once A Day 2)  Levothyroxine Sodium 100 Mcg Tabs (Levothyroxine Sodium) .... Take 1 Tablet By Mouth Once A  Day 3)  Protonix 40 Mg  Tbec (Pantoprazole Sodium) .[name].. 1 By Mouth Once Daily 4)  Alprazolam 0.25 Mg  Tabs (Alprazolam) .[name].. 1 By Mouth Two Times A Day As Needed 5)  Claritin 10  Mg Tabs (Loratadine) .... As Needed 6)  Chlorhexidine Gluconate 0.12 % Soln (Chlorhexidine Gluconate) .... As Directed Mouth Wash 7)  Multivitamins  Caps (Multiple Vitamin) .... Once Daily 8)  Promethazine Hcl 25 Mg Tabs (Promethazine Hcl) .[name].. 1 By Mouth Every 4 Hours As Needed For Nausea 9)  Flomax 0.4 Mg Caps (Tamsulosin Hcl) .[name].. 1 By Mouth Once Daily  Current Medications (verified): 1)  Furosemide 20 Mg Tabs (Furosemide) .... Take 1 Tablet By Mouth Once A Day 2)  Levothyroxine Sodium 88 Mcg Tabs (Levothyroxine Sodium) .[name].. 1po Once Daily 3)  Protonix 40 Mg  Tbec (Pantoprazole Sodium) .[name].. 1 By Mouth Once Daily 4)  Alprazolam 0.25 Mg  Tabs (Alprazolam) .[name].. 1 By Mouth Two Times A Day  As Needed 5)  Claritin 10 Mg Tabs (Loratadine) .... As Needed 6)  Multivitamins  Caps (Multiple Vitamin) .... Once Daily  Allergies (verified): 1)  ! Aspirin 2)  ! Sulfa 3)  ! Amoxicillin 4)  ! Avelox (Moxifloxacin Hcl) 5)  ! 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  Married 3 children Former Smoker Alcohol use-no daughter moved back in  Risk Factors: Exercise: no (05/12/2008)  Risk Factors: Smoking Status: quit (07/19/2007)  Past Medical History: Allergic rhinitis Anxiety Colonic polyps, hx of - adenomatous Hypothyroidism lumbar disc disease Diverticulosis, colon Nephrolithiasis hypotonic bladder - hosp 06/2009 for UTI MD roster: Andreas Newport - wrenn  Review of Systems  The patient denies anorexia, fever, vision loss, decreased hearing, hoarseness, chest pain, syncope, dyspnea on exertion, peripheral edema, prolonged cough, headaches, hemoptysis, abdominal pain, melena, hematochezia, severe indigestion/heartburn, hematuria, muscle weakness, suspicious skin lesions, transient blindness, difficulty walking, depression, unusual weight change, abnormal bleeding, enlarged lymph nodes, and angioedema.         all otherwise negative per pt -  except did have 2 separate days of fleeting pain to left neck for  only at time with radaition to just below the left elbow;  no recurrance and no bowel or bladder change, fever, wt loss, gait change  Physical Exam  General:  alert, well-developed, well-nourished, and cooperative to examination. Head:  normocephalic and atraumatic.   Eyes:  vision grossly intact; pupils equal, round and reactive to light.   conjunctiva and lids normal.    Ears:  R ear normal and L ear normal.   Nose:  External nasal examination shows no deformity or inflammation. Nasal mucosa are pink and moist without lesions or exudates. Mouth:  teeth and gums in good repair; mucous membranes moist, without lesions or ulcers. oropharynx clear without exudate, no erythema.  Neck:  supple with right thyroid nodule ? approx 1cm, nontender Lungs:  normal respiratory effort, no intercostal retractions or use of accessory muscles; normal breath sounds bilaterally - no crackles and no wheezes.    Heart:  normal rate, regular rhythm, no murmur, and no rub. BLE with trace edema bilat Abdomen:  soft, non-tender, normal bowel sounds, no distention; no masses and no appreciable hepatomegaly or splenomegaly.  Msk:  no joint tenderness and no joint swelling.   Extremities:  no edema, no erythema  Neurologic:  strength normal in all extremities, sensation intact to light touch, and gait normal.   Skin:  color normal and no rashes.   Psych:  flat affect and moderately anxious.     Impression & Recommendations:  Problem # 1:  Preventive Health Care (ICD-V70.0) Overall doing well, age appropriate education and counseling updated, referral for preventive services and immunizations addressed, dietary counseling and smoking status adressed , most recent labs reviewed, ecg reviewed or declined I have personally reviewed and have noted 1.The patient's medical and social history 2.Their use of alcohol, tobacco or illicit drugs 3.Their current medications and supplements 4. Functional ability including ADL's, fall risk, home safety risk, hearing & visual impairment  5.Diet and physical activities 6.Evidence for depression or mood disorders The patients weight, height, BMI  have been recorded in the chart I have made referrals, counseling and provided education to the patient based review of the above  Orders: EKG w/ Interpretation  (93000)  Problem # 2:  THYROID NODULE, RIGHT (ICD-241.0) for thyroid u/s Orders: Radiology Referral (Radiology)  Problem # 3:  HYPOTHYROIDISM (ICD-244.9)  Her updated medication list for this problem includes:    Levothyroxine Sodium 88 Mcg Tabs (Levothyroxine sodium) .[name]... 1po once daily overcontrolled - to decr med to 88, and f/u TSH in 4 wks  Labs Reviewed: TSH: 0.23 (02/14/2010)    Chol: 155 (02/14/2010)   HDL: 49.10 (02/14/2010)   LDL: 94 (02/14/2010)   TG: 61.0 (02/14/2010)  Problem # 4:  EDEMA (ICD-782.3)  Her updated medication list for this problem includes:    Furosemide 20 Mg Tabs (Furosemide) .[name]... Take 1 tablet by mouth once a day suspect venous insuff type - for incr lasix as above as needed   Problem # 5:  NECK PAIN, RIGHT (ICD-723.1) exam benign, ok to follow for now, consider MRI for worsening symptoms/signs  Complete Medication List: 1)  Furosemide 20 Mg Tabs (Furosemide) .... Take 1 tablet by mouth once a day 2)  Levothyroxine Sodium 88 Mcg Tabs (Levothyroxine sodium) .[name].. 1po once daily 3)  Protonix 40 Mg Tbec (Pantoprazole sodium) .[name].. 1 by mouth once daily 4)  Alprazolam 0.25 Mg Tabs (Alprazolam) .[name].. 1 by mouth two times a day as needed 5)  Claritin 10 Mg Tabs (Loratadine) .... As needed 6)  Multivitamins Caps (Multiple vitamin) .... Once daily  Patient Instructions: 1)  decrease the thyroid med to 88 micrograms per day 2)  increase the lasix (furosemide) to 40 mg - 1/2 to 1 per day as needed for swelling 3)  Please followup in 4 wks for LAB only: 4)  BMET 729.81 5)  TSH  244.8 6)  You will be contacted about the referral(s) to: thyroid u/s 7)  Continue all previous medications as before this visit 8)  Please schedule a follow-up appointment in 1 year or sooner if needed Prescriptions: ALPRAZOLAM 0.25 MG  TABS (ALPRAZOLAM) 1 by mouth two times a day as needed  #60 x 2   Entered and Authorized by:   Biagio Borg MD   Signed by:   Biagio Borg MD on  02/21/2010   Method used:   Print then Give to Patient   RxID:   [id number] PROTONIX 40 MG  TBEC (PANTOPRAZOLE SODIUM) 1 by mouth once daily  #90 x 3   Entered and Authorized by:   Biagio Borg MD   Signed by:   Biagio Borg MD on 02/21/2010   Method used:   Print then Give to Patient   RxID:   [id number] LEVOTHYROXINE SODIUM 88 MCG TABS (LEVOTHYROXINE SODIUM) 1po once daily  #  90 x 3   Entered and Authorized by:   Biagio Borg MD   Signed by:   Biagio Borg MD on 02/21/2010   Method used:   Print then Give to Patient   [id number]    Orders Added: 1)  EKG w/ Interpretation [id number])  Radiology Referral [Radiology] 3)  Est. Patient 40-64 years [id number]

## 2010-05-24 NOTE — Progress Notes (Signed)
Summary:  Alprazolam 0.25 Mg   Phone Note Call from Patient Call back at [facility] Phone [phone]   Caller: Patient Call For: dr Jenny Reichmann Summary of Call: per pt walk in sheet need a refill on  Alprazolam 0.25 Mg Tabs (Alprazolam) .[name].. 1 by mouth two times a day prn Initial call taken by: Nonah Mattes,  January 03, 2008 4:06 PM  Follow-up for Phone Call        done hardcopy to debra Follow-up by: Biagio Borg MD,  January 03, 2008 4:06 PM  Additional Follow-up for Phone Call Additional follow up Details #1::        January 06, 2008 9:05 AM  left msh g on machine that rx would be left a front office for pick up Additional Follow-up by: Nonah Mattes,  January 06, 2008 9:09 AM    New/Updated Medications: ALPRAZOLAM 0.25 MG  TABS (ALPRAZOLAM) 1 by mouth two times a day as needed   Prescriptions: ALPRAZOLAM 0.25 MG  TABS (ALPRAZOLAM) 1 by mouth two times a day as needed  #60 x 1   Entered and Authorized by:   Biagio Borg MD   Signed by:   Biagio Borg MD on 01/03/2008   Method used:   Print then Give to Patient   [id number]   Appended Document:  Alprazolam 0.25 Mg  pt called checking the status of  medication  called her back to   inform left the rx at front office for pick pt made aware

## 2010-05-24 NOTE — Assessment & Plan Note (Signed)
Summary: URGENT CARE FU/$50/PN   Vital Signs:  Patient Profile:   55 Years Old Female Weight:      191 pounds O2 Sat:      98 % O2 treatment:    Room Air Temp:     98.5 degrees F oral Pulse rate:   78 / minute BP sitting:   122 / 84  (right arm) Cuff size:   regular  Vitals Entered By: Sherlean Foot CMA (February 25, 2008 4:44 PM)                 Chief Complaint:  f/u  urgent care/ear ache/dizzy.  History of Present Illness: here with increase in her faical pain, fever, greenish d/c and fever for 3 days    Updated Prior Medication List: FUROSEMIDE 20 MG TABS (FUROSEMIDE) Take 1 tablet by mouth once a day LEVOTHYROXINE SODIUM 100 MCG TABS (LEVOTHYROXINE SODIUM) Take 1 tablet by mouth once a  day PROTONIX 40 MG  TBEC (PANTOPRAZOLE SODIUM) 1 by mouth qd ALPRAZOLAM 0.25 MG  TABS (ALPRAZOLAM) 1 by mouth two times a day as needed AZITHROMYCIN HYDROGENCITRATE 500 MG SOLR (AZITHROMYCIN HYDROGENCITRATE)  MECLIZINE HCL 25 MG TABS (MECLIZINE HCL)  * CALCIUM  * POTASSIUM  DOXYCYCLINE HYCLATE 100 MG TABS (DOXYCYCLINE HYCLATE) 1po two times a day  Current Allergies (reviewed today): ! ASPIRIN ! SULFA ! AMOXICILLIN ! 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  , mild ill  Head:     Normocephalic  and atraumatic without obvious abnormalities. No apparent alopecia or balding. Eyes:     No corneal or conjunctival inflammation noted. EOMI. Perrla. Ears:     bilat tm's red, sinus tender bilat Nose:     nasal dischargemucosal pallor and mucosal erythema.   Mouth:     pharyngeal erythema and fair dentition.   Neck:     supple and full ROM.   Lungs:     Normal respiratory effort, chest expands symmetrically. Lungs are clear to auscultation, no crackles or wheezes. Heart:     Normal rate and regular rhythm. S1 and S2 normal without gallop, murmur, click, rub or other extra sounds. Extremities:     no edema, no ulcers     Impression & Recommendations:  Problem # 1:  SINUSITIS- ACUTE-NOS (ICD-461.9)  Her updated medication list for this problem includes:  Doxycycline Hyclate 100 Mg Tabs (Doxycycline hyclate) .[name]... 1po two times a day treat as above, f/u any worsening signs or symptoms , also for mucinx otc as needed   Complete Medication List: 1)  Furosemide 20 Mg Tabs (Furosemide) .... Take 1 tablet by mouth once a day 2)  Levothyroxine Sodium 100 Mcg Tabs (Levothyroxine sodium) .... Take 1 tablet by mouth once a  day 3)  Protonix 40 Mg Tbec (Pantoprazole sodium) .[name].. 1 by mouth qd 4)  Alprazolam 0.25 Mg Tabs (Alprazolam) .[name].. 1 by mouth two times a day as needed 5)  Meclizine Hcl 25 Mg Tabs (Meclizine hcl) 6)  Calcium  7)  Potassium  8)  Doxycycline Hyclate 100 Mg Tabs (Doxycycline hyclate) .[name].. 1po two times a day   Patient Instructions: 1)  Please take all new medications as prescribed 2)  Continue all medications that you may have been taking previously 3)  Please schedule a follow-up appointment as needed.   Prescriptions: DOXYCYCLINE HYCLATE 100 MG TABS (DOXYCYCLINE HYCLATE) 1po two times a day  #20 x 0   Entered and Authorized by:   Biagio Borg MD   Signed by:   Biagio Borg MD on 02/25/2008   Method used:   Print then Give to Patient   [id number]  ]

## 2010-05-24 NOTE — Progress Notes (Signed)
Summary: lab results   Phone Note Call from Patient Call back at [phone]   Caller: Patient Reason for Call: Talk to Nurse, Lab or Test Results Summary of Call: request lab results Initial call taken by: Darnell Level,  March 12, 2009 3:36 PM  Follow-up for Phone Call        pt aware of results will f/u w/Dr Ignacia Palma, RN  March 12, 2009 4:07 PM

## 2010-05-24 NOTE — Progress Notes (Signed)
Summary: stress test results   Phone Note Call from Patient Call back at [phone]   Caller: Patient Reason for Call: Lab or Test Results Summary of Call: request stress test results Initial call taken by: Darnell Level,  February 11, 2009 2:43 PM  Follow-up for Phone Call        Pt aware of results. It looks as though Dr. Jenny Reichmann tried to call her. The pt states she has had no messages. I explained to her her stress test looks ok, but if she has any further questions, to call Dr. Gwynn Burly office. Alvis Lemmings, RN, BSN  February 11, 2009 3:20 PM

## 2010-05-24 NOTE — Consult Note (Signed)
Summary: [facility]  [facility]   Imported By: Jerrye Noble D'jimraou 04/09/2007 13:30:31  _____________________________________________________________________  External Attachment:    Type:   Image     Comment:   External Document

## 2010-05-24 NOTE — Assessment & Plan Note (Signed)
Summary: POST HOSP/ WEAKNESS/[facility]   Vital Signs:  Patient profile:   55 year old female O2 Sat:      97 % on Room air Temp:     97.6 degrees F Pulse rate:   85 / minute Resp:     16 per minute BP sitting:   122 / 84  O2 Flow:  Room air  History of Present Illness: here post hospn with palpiations, n/v and neck pain;  r/o'd for MI and has referral/appt with dr Aundra Dubin;  still with similar complaints and fatigue intermittent since then as well vague chest discomfortt without further radiation, n/v, sob, diaphoresis;  she went back to work today but was not comfortable with doing so; denies fever , ST, cough , wheezing or increased anxiety although she seems very tense today and worried.  Pt denies new neuro symptoms such as headache, facial or extremity weakness .  Last GXT approx 2 yrs.  For some reason did not "take" to dr Ron Parker then, and does not "trust" the referral to the cardiologist she does no know (dr Aundra Dubin - has appt nov 4)  Problems Prior to Update: 1)  Chest Pain  (ICD-786.50) 2)  Sinusitis, Chronic  (ICD-473.9) 3)  Cellulitis  (ICD-682.9) 4)  Labrynthitis  (ICD-386.30) 5)  Back Pain  (ICD-724.5) 6)  Preventive Health Care  (ICD-V70.0) 7)  Sinusitis- Acute-nos  (ICD-461.9) 8)  Radiological Examination Nec  (ICD-V72.5) 9)  Diverticulosis, Colon  (ICD-562.10) 10)  Hypothyroidism  (ICD-244.9) 11)  Colonic Polyps, Hx of  (ICD-V12.72) 12)  Anxiety  (ICD-300.00) 13)  Ct, Chest, Abnormal  (ICD-794.2) 14)  Chest Pain  (ICD-786.50) 15)  Degenerative Disc Disease  (ICD-722.6) 16)  Urinary Retention  (ICD-788.20) 17)  Allergic Rhinitis  (ICD-477.9)  Medications Prior to Update: 1)  Furosemide 20 Mg Tabs (Furosemide) .... Take 1 Tablet By Mouth Once A Day 2)  Levothyroxine Sodium 100 Mcg Tabs (Levothyroxine Sodium) .... Take 1 Tablet By Mouth Once A  Day 3)  Protonix 40 Mg  Tbec (Pantoprazole Sodium) .[name].. 1 By Mouth Once Daily 4)  Alprazolam 0.25 Mg  Tabs (Alprazolam) .[name].. 1 By  Mouth Two Times A Day As Needed 5)  Calcium 6)  Potassium 7)  Omnaris 50 Mcg/act Susp (Ciclesonide) .... 2 Puffs Each Nostril Once Daily 8)  Doxycycline Hyclate 100 Mg Caps (Doxycycline Hyclate) .[name].. 1 By Mouth Two Times A Day  Current Medications (verified): 1)  Furosemide 20 Mg Tabs (Furosemide) .... Take 1 Tablet By Mouth Once A Day 2)  Levothyroxine Sodium 100 Mcg Tabs (Levothyroxine Sodium) .... Take 1 Tablet By Mouth Once A  Day 3)  Protonix 40 Mg  Tbec (Pantoprazole Sodium) .[name].. 1 By Mouth Once Daily 4)  Alprazolam 0.25 Mg  Tabs (Alprazolam) .[name].. 1 By Mouth Two Times A Day As Needed 5)  Calcium 6)  Potassium 7)  Omnaris 50 Mcg/act Susp (Ciclesonide) .... 2 Puffs Each Nostril Once Daily  Allergies (verified): 1)  ! Aspirin 2)  ! Sulfa 3)  ! Amoxicillin 4)  ! * Avelox 5)  ! 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   Head:  normocephalic and atraumatic.   Eyes:  vision grossly intact, pupils equal, and pupils round.   Ears:  left TM mild erythema, sinus nontender Nose:  no external deformity and no nasal discharge.   Mouth:  no gingival abnormalities and pharyngeal erythema.   Neck:  supple and cervical lymphadenopathy with midl swelling and tenderness to the right submandibular and ant SCM area Lungs:  normal respiratory  effort and normal breath sounds.   Heart:  normal rate and regular rhythm.   Extremities:  no edema, no erythema  Psych:  good eye contact, not depressed appearing, and moderately anxious.     Impression & Recommendations:  Problem # 1:  URI (ICD-465.9) prob viral URI recently, I think may have been responsible for the chest pain/n/v leading to recent admit, even though she has been asymptomatic , but cannot prove this; she is not sure about this, since she was told by her hospitalist she did not have a viral illness.  To follow  Problem # 2:  CHEST PAIN (ICD-786.50)  unclear etiology - ok for stress test, and pt requests change from dr Aundra Dubin (whom she does not know) to dr Stanford Breed who attended her father-in-law before his death  Orders: [facility]) Cardiology Referral (Cardiology)  Problem # 3:  ANXIETY (ICD-300.00)  Her updated medication list for this problem includes:    Alprazolam 0.25 Mg Tabs (Alprazolam) .[name]... 1 by mouth two times a day as needed overall o/w stable overall by hx and exam, ok to continue meds/tx as is   Complete Medication List: 1)  Furosemide 20 Mg Tabs (Furosemide) .... Take 1 tablet by mouth once a day 2)  Levothyroxine Sodium 100 Mcg Tabs (Levothyroxine sodium) .... Take 1 tablet by mouth once a  day 3)  Protonix 40 Mg Tbec (Pantoprazole sodium) .[name].. 1 by mouth once daily 4)  Alprazolam 0.25 Mg Tabs (Alprazolam) .[name].. 1 by mouth two times a day as needed 5)  Calcium  6)  Potassium  7)  Omnaris 50 Mcg/act Susp (Ciclesonide) .... 2 puffs each nostril once daily  Patient Instructions: 1)  Continue all previous medications as before this visit  2)  You will be contacted about the referral(s) to: stress test, and the consultation with Dr Stanford Breed 3)  Please schedule a follow-up appointment as needed.

## 2010-05-24 NOTE — Assessment & Plan Note (Signed)
Summary: 2 wk f/u / $50 / cd   Vital Signs:  Patient Profile:   55 Years Old Female Weight:      187.38 pounds Temp:     98.5 degrees F oral Pulse rate:   94 / minute BP sitting:   118 / 66  (right arm) Cuff size:   regular  Vitals Entered By: [name] (June 03, 2008 3:56 PM)                 Preventive Care Screening  Last Tetanus Booster:    Next Due:  04/2018  Mammogram:    Date:  08/23/2007    Next Due:  10/2008    Results:  normal   Pap Smear:    Date:  06/02/2008    Results:  normal   Colonoscopy:    Date:  02/24/2008    Results:  done per pt    Chief Complaint:  2 week f/u on ears.  History of Present Illness: seen per dr [name] 2 wks ago with much improved bilat ear symptoms at this point - only some mild pressure sensation bilat now, without pain, fever , popping sensation, hearing loss, dizziness, tinnitus or nausea; no ST, cough, CP, wheezing, sob, doe, orthopnea, pnd or LE edema    Updated Prior Medication List: FUROSEMIDE 20 MG TABS (FUROSEMIDE) Take 1 tablet by mouth once a day LEVOTHYROXINE SODIUM 100 MCG TABS (LEVOTHYROXINE SODIUM) Take 1 tablet by mouth once a  day PROTONIX 40 MG  TBEC (PANTOPRAZOLE SODIUM) 1 by mouth once daily ALPRAZOLAM 0.25 MG  TABS (ALPRAZOLAM) 1 by mouth two times a day as needed * CALCIUM  * POTASSIUM  FLOMAX 0.4 MG XR24H-CAP (TAMSULOSIN HCL) 1po once daily BACLOFEN 10 MG TABS (BACLOFEN) 1/2 by mouth two times a day OXYCODONE-ACETAMINOPHEN 5-325 MG TABS (OXYCODONE-ACETAMINOPHEN) 1po q 6hrs as needed OMNARIS 50 MCG/ACT SUSP (CICLESONIDE) 2 puffs each nostril once daily  Current Allergies (reviewed today): ! ASPIRIN ! SULFA ! AMOXICILLIN ! [name]  Past Medical History:    Reviewed history from 07/19/2007 and no changes required:       Allergic rhinitis       Anxiety       Colonic polyps, hx of - adenomatous       Hypothyroidism       lumbar disc disease       Diverticulosis, colon  Past Surgical  History:    Reviewed history from 04/14/2008 and no changes required:       Hysterectomy       Cholecystectomy       ovary cyst 2006       s/p left breast biopsy neg 8/09   Social History:    Reviewed history from 07/19/2007 and no changes required:       work - [name]  - [name]       Married       3 children       Former Smoker       Alcohol use-no       daughter moved back in   Risk Factors:  PAP Smear History:     Date of Last PAP Smear:  06/02/2008    Results:  normal   Colonoscopy History:     Date of Last Colonoscopy:  02/24/2008    Results:  done per pt   Mammogram History:     Date of Last Mammogram:  08/23/2007    Results:  normal  Review of Systems       all otherwise negative , denies hyper or hypo thyroid symptoms, wt stable   Physical Exam  General:     alert and overweight-appearing.   Head:     Normocephalic and atraumatic without obvious abnormalities. No apparent alopecia or balding. Eyes:     No corneal or conjunctival inflammation noted. EOMI. Perrla. Ears:     External ear exam shows no significant lesions or deformities.  Otoscopic examination reveals clear canals, tympanic membranes are intact bilaterally without bulging, retraction, inflammation or discharge. Hearing is grossly normal bilaterally. Nose:     External nasal examination shows no deformity or inflammation. Nasal mucosa are pink and moist without lesions or exudates. Mouth:     Oral mucosa and oropharynx without lesions or exudates.  Teeth in good repair. Neck:     No deformities, masses, or tenderness noted. Lungs:     Normal respiratory effort, chest expands symmetrically. Lungs are clear to auscultation, no crackles or wheezes. Heart:     Normal rate and regular rhythm. S1 and S2 normal without gallop, murmur, click, rub or other extra sounds. Extremities:     no edema, no ulcers     Impression & Recommendations:  Problem # 1:  LABRYNTHITIS  (ICD-386.30) resolved - ok to follow with expectant management, educated, reassured  Problem # 2:  HYPOTHYROIDISM (ICD-244.9)  Her updated medication list for this problem includes:    Levothyroxine Sodium 100 Mcg Tabs (Levothyroxine sodium) .[name]... Take 1 tablet by mouth once a  day stable overall by hx and exam, ok to continue meds/tx as is - reviewed with pt, no need further change med at this time Labs Reviewed: TSH: 1.42 (04/08/2008)    Chol: 166 (04/08/2008)   HDL: 49.0 (04/08/2008)   LDL: 99 (04/08/2008)   TG: 88 (04/08/2008)   Problem # 3:  CT, CHEST, ABNORMAL (ICD-794.2) also d/w pt per her reques -CT 2009 with decreased size subcarinal LA and felt to be benign and no further evaluation needed  Complete Medication List: 1)  Furosemide 20 Mg Tabs (Furosemide) .... Take 1 tablet by mouth once a day 2)  Levothyroxine Sodium 100 Mcg Tabs (Levothyroxine sodium) .... Take 1 tablet by mouth once a  day 3)  Protonix 40 Mg Tbec (Pantoprazole sodium) .[name].. 1 by mouth once daily 4)  Alprazolam 0.25 Mg Tabs (Alprazolam) .[name].. 1 by mouth two times a day as needed 5)  Calcium  6)  Potassium  7)  Flomax 0.4 Mg Xr24h-cap (Tamsulosin hcl) .[name].. 1po once daily 8)  Baclofen 10 Mg Tabs (Baclofen) .... 1/2 by mouth two times a day 9)  Oxycodone-acetaminophen 5-325 Mg Tabs (Oxycodone-acetaminophen) .[name].. 1po q 6hrs as needed 10)  Omnaris 50 Mcg/act Susp (Ciclesonide) .... 2 puffs each nostril once daily   Patient Instructions: 1)  Continue all medications that you may have been taking previously 2)  You can also use Mucinex OTC for congestion and ear problems 3)  Please make followup appt in December 2010 for your yearly exam with CPX labs

## 2010-05-24 NOTE — Progress Notes (Signed)
Summary: rx phoned in  Phone Note Call from Patient Call back at Home Phone [phone]   Caller: Patient Call For: Biagio Borg MD Summary of Call: per Johnn Hai call states  the pharmacy did not get this rx need the medication called in to the pharmacy for  [facility] [phone]*   Last Fill Date:  03/05/2008   Pharmacy Phone:  [phone]   Pharmacy Notes:   Initial call taken by: Nonah Mattes,  April 08, 2008 11:31 AM  Follow-up for Phone Call        called pt and made her aware rx was denied then it was  approved and sent in about the same time she called in about rx.....Marland Kitchenso she should recheck with pharmacy Follow-up by: Hector Brunswick,  April 08, 2008 1:22 PM

## 2010-05-24 NOTE — Consult Note (Signed)
Summary: [facility]   Imported By: Jerrye Noble D'jimraou 12/17/2007 13:04:34  _____________________________________________________________________  External Attachment:    Type:   Image     Comment:   External Document

## 2010-05-24 NOTE — Assessment & Plan Note (Signed)
Summary: FU ON LABS PER FLAG/NWS   Vital Signs:  Patient profile:   55 year old female Height:      66 inches Weight:      190 pounds BMI:     30.78 O2 Sat:      99 % on Room air Temp:     97.7 degrees F oral Pulse rate:   79 / minute BP sitting:   106 / 66  (left arm) Cuff size:   large  Vitals Entered ByMarland Kitchen Shirlean Mylar Ewing (March 16, 2009 3:44 PM)  O2 Flow:  Room air  Preventive Care Screening     alsrady had the flu shot  CC: followup Labs/RE   CC:  followup Labs/RE.  History of Present Illness: still marked fatigue assoc with some difficlty sleeping at night, but no fever or specific infectious symptoms such as headache, St, neck pain, cough, and Pt denies CP, sob, doe, wheezing, orthopnea, pnd, worsening LE edema, palps, dizziness or syncope   Denies GU symptoms such as pain, urgency or hematuria.  Had recent mild elev WBC to just over 12 and asked to f/u here.  Denies recent wt loss, night sweats or other constitutional symtpoms.  Is under marked stress at home in the evenings , with husband on antidepressant which does not seem to be working out well, and he refuses marital counseling.  She denies significant depressive symtpoms or suicidal ideation, and declines further tx herself such as SSRI trial.    Problems Prior to Update: 1)  Leukocytosis  (ICD-288.60) 2)  Fatigue  (ICD-780.79) 3)  Uri  (ICD-465.9) 4)  Chest Pain  (ICD-786.50) 5)  Sinusitis, Chronic  (ICD-473.9) 6)  Cellulitis  (ICD-682.9) 7)  Labrynthitis  (ICD-386.30) 8)  Back Pain  (ICD-724.5) 9)  Preventive Health Care  (ICD-V70.0) 10)  Sinusitis- Acute-nos  (ICD-461.9) 11)  Radiological Examination Nec  (ICD-V72.5) 12)  Diverticulosis, Colon  (ICD-562.10) 13)  Hypothyroidism  (ICD-244.9) 14)  Colonic Polyps, Hx of  (ICD-V12.72) 15)  Anxiety  (ICD-300.00) 16)  Ct, Chest, Abnormal  (ICD-794.2) 17)  Chest Pain  (ICD-786.50) 18)  Degenerative Disc Disease  (ICD-722.6) 19)  Urinary Retention   (ICD-788.20) 20)  Allergic Rhinitis  (ICD-477.9)  Medications Prior to Update: 1)  Furosemide 20 Mg Tabs (Furosemide) .... Take 1 Tablet By Mouth Once A Day 2)  Levothyroxine Sodium 100 Mcg Tabs (Levothyroxine Sodium) .... Take 1 Tablet By Mouth Once A  Day 3)  Protonix 40 Mg  Tbec (Pantoprazole Sodium) .[name].. 1 By Mouth Once Daily 4)  Alprazolam 0.25 Mg  Tabs (Alprazolam) .[name].. 1 By Mouth Two Times A Day As Needed 5)  Klor-Con 10 10 Meq Cr-Tabs (Potassium Chloride) .[name].. 1 Tab By Mouth Once Daily 6)  Claritin 10 Mg Tabs (Loratadine) .... As Needed 7)  Chlorhexidine Gluconate 0.12 % Soln (Chlorhexidine Gluconate) .... As Directed Mouth Wash  Current Medications (verified): 1)  Furosemide 20 Mg Tabs (Furosemide) .... Take 1 Tablet By Mouth Once A Day 2)  Levothyroxine Sodium 100 Mcg Tabs (Levothyroxine Sodium) .... Take 1 Tablet By Mouth Once A  Day 3)  Protonix 40 Mg  Tbec (Pantoprazole Sodium) .[name].. 1 By Mouth Once Daily 4)  Alprazolam 0.25 Mg  Tabs (Alprazolam) .[name].. 1 By Mouth Two Times A Day As Needed 5)  Klor-Con 10 10 Meq Cr-Tabs (Potassium Chloride) .[name].. 1 Tab By Mouth Once Daily 6)  Claritin 10 Mg Tabs (Loratadine) .... As Needed 7)  Chlorhexidine Gluconate 0.12 % Soln (Chlorhexidine Gluconate) .Marland KitchenMarland KitchenMarland Kitchen  As Directed Mouth Wash  Allergies (verified): 1)  ! Aspirin 2)  ! Sulfa 3)  ! Amoxicillin 4)  ! * Avelox 5)  ! 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         all otherwise negative per pt -; incidently aasks for for ca-125 with labs  Physical Exam  General:  alert and overweight-appearing.   Head:  normocephalic and atraumatic.   Eyes:  vision grossly intact, pupils equal, and pupils round.   Ears:  R ear normal and L ear normal.   Nose:  no external deformity and no nasal discharge.   Mouth:  no gingival abnormalities and pharynx pink and moist.   Neck:  supple and no masses.   Lungs:  normal respiratory effort and normal breath sounds.   Heart:  normal rate and regular rhythm.   Abdomen:  soft, non-tender, and normal bowel sounds.   Msk:  no joint tenderness and no joint swelling.   Extremities:  no edema, no erythema  Neurologic:  cranial nerves II-XII intact and strength normal in all extremities.     Impression & Recommendations:  Problem # 1:  Preventive Health Care (ICD-V70.0)  Overall doing well, up to date, counseled on routine health concerns for screening and prevention, immunizations up to date or declined, labs ordered for tomorrow   Problem # 2:  FATIGUE (ICD-780.79) unclear - I suspect depressed but declines further tx  Problem # 3:  ANXIETY (ICD-300.00)  Her updated medication list for this problem includes:    Alprazolam 0.25 Mg Tabs (Alprazolam) .[name]... 1 by mouth two times a day as needed stable overall by hx and exam, ok to continue meds/tx as is   - pt declines trial SSRI  Problem # 4:  LEUKOCYTOSIS (ICD-288.60) no obivous source - to asses as above  Complete Medication List: 1)  Furosemide 20 Mg Tabs (Furosemide) .... Take 1 tablet by mouth once a day 2)  Levothyroxine Sodium 100 Mcg Tabs (Levothyroxine sodium) .... Take 1 tablet by mouth once a  day 3)  Protonix 40 Mg Tbec (Pantoprazole sodium) .[name].. 1 by mouth once daily 4)  Alprazolam 0.25 Mg Tabs (Alprazolam) .[name].. 1 by mouth two times a day as needed 5)  Klor-con 10 10 Meq Cr-tabs (Potassium chloride) .[name].. 1 tab by mouth once daily 6)  Claritin 10 Mg Tabs (Loratadine) .... As needed 7)  Chlorhexidine Gluconate 0.12 % Soln (Chlorhexidine gluconate) .... As directed mouth wash  Patient Instructions: 1)  Please return tomorrow for CPX labs v70.0 and: 2)  Ca-125 - v70.0 3)  Continue all previous medications as before this visit  4)  Please schedule a follow-up appointment in 1 year or sooner if needed  Appended Document: Orders Update    Clinical Lists Changes  Orders: Added new Test order of TLB-Lipid Panel (80061-LIPID) - Signed Added new Test order of TLB-BMP (Basic Metabolic Panel-BMET) (99991111) - Signed Added new Test order of TLB-CBC Platelet - w/Differential (85025-CBCD) - Signed Added new Test order of TLB-Hepatic/Liver Function Pnl (80076-HEPATIC) - Signed Added new Test order of TLB-TSH (Thyroid Stimulating Hormone) (84443-TSH) - Signed Added new Test order of TLB-Udip ONLY (81003-UDIP) - Signed

## 2010-05-24 NOTE — Progress Notes (Signed)
Summary: antibiotic  Phone Note Call from Patient   Caller: walk-in sheet Complaint: Earache/Ear Infection Summary of Call: Pt states hse have inner ear infection. Requesting Rx for doxcycline.  Initial call taken by: Tomma Lightning,  May 12, 2008 9:09 AM  Follow-up for Phone Call        Let pt know that she would need ov for antibiotic. Pt made appt to see Dr. Ronnald Ramp Follow-up by: Tomma Lightning,  May 12, 2008 9:09 AM

## 2010-05-24 NOTE — Assessment & Plan Note (Signed)
Summary: hospital f/u Sd   Vital Signs:  Patient Profile:   55 Years Old Female Weight:      180 pounds Temp:     97.3 degrees F oral Pulse rate:   82 / minute BP sitting:   126 / 73  (right arm) Cuff size:   regular  Pt. in pain?   no  Vitals Entered By: Elveria Royals (July 19, 2007 4:25 PM)                  Chief Complaint:  HFU/chest pain.  History of Present Illness: " still not my normal self" yet although hard to pin her down on whether she has any further pain since she left the hospital; is taking the PPI; ongoing severe anxiety persists    Updated Prior Medication List: FUROSEMIDE 20 MG TABS (FUROSEMIDE) Take 1 tablet by mouth once a day LEVOTHYROXINE SODIUM 100 MCG TABS (LEVOTHYROXINE SODIUM) Take 1 tablet by mouth once a  day PROTONIX 40 MG  TBEC (PANTOPRAZOLE SODIUM) 1 by mouth qd ALPRAZOLAM 0.25 MG  TABS (ALPRAZOLAM) 1 by mouth two times a day prn  Current Allergies (reviewed today): ! ASPIRIN ! SULFA ! AMOXICILLIN ! 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 No apparent alopecia or balding. Eyes:     No corneal or conjunctival inflammation noted. EOMI. Perrla Ears:     External ear exam shows no significant lesions or deformities.  Otoscopic examination reveals clear canals, tympanic membranes are intact bilaterally without bulging, retraction, inflammation or discharge. Hearing is grossly normal bilaterally. Nose:     External nasal examination shows no deformity or inflammation. Nasal mucosa are pink and moist without lesions or exudates. Mouth:     Oral mucosa and oropharynx without lesions or exudates.  Teeth in good repair. Neck:     No deformities, masses, or tenderness noted. Lungs:     Normal respiratory effort, chest expands symmetrically. Lungs are clear to auscultation, no crackles or wheezes. Heart:     Normal rate and regular rhythm. S1 and S2 normal without gallop, murmur, click, rub or other extra sounds.  Abdomen:     Bowel sounds positive,abdomen soft and non-tender without masses, organomegaly or hernias noted. Extremities:     no edema, no ulcers     Impression & Recommendations:  Problem # 1:  CHEST PAIN (ICD-786.50) unclear etiology - I ? whether original pain was esoph spasm with mid chest pain with rad to left arm; cont PPI, recent cardiac w/u neg, ok to o/w follow, reassured, educated  Problem # 2:  CT, CHEST, ABNORMAL (ICD-794.2) f/u ct in 3 mo with borderline LA subcarinal and right hilar  Problem # 3:  ANXIETY (ICD-300.00)  Her updated medication list for this problem includes:    Alprazolam 0.25 Mg Tabs (Alprazolam) .[name]... 1 by mouth two times a day prn tx as above prn  Complete Medication List: 1)  Furosemide 20 Mg Tabs (Furosemide) .... Take 1 tablet by mouth once a day 2)  Levothyroxine Sodium 100 Mcg Tabs (Levothyroxine sodium) .... Take 1 tablet by mouth once a  day 3)  Protonix 40 Mg Tbec (Pantoprazole sodium) .[name].. 1 by mouth qd 4)   Alprazolam 0.25 Mg Tabs (Alprazolam) .[name].. 1 by mouth two times a day prn   Patient Instructions: 1)  take all medications as prescribed 2)  continue all medications that you may have been taking previously 3)  you will be contacted about the CT to be done in 3 months 4)  Please schedule a follow-up appointment as needed.    Prescriptions: ALPRAZOLAM 0.25 MG  TABS (ALPRAZOLAM) 1 by mouth two times a day prn  #60 x 1   Entered and Authorized by:   Biagio Borg MD   Signed by:   Biagio Borg MD on 07/19/2007   Method used:   Print then Give to Patient   RxIDFS:[id number]  ]

## 2010-05-24 NOTE — Consult Note (Signed)
Summary: abn mammo/[facility]  abn mammo/[facility]   Imported By: Bubba Hales 11/12/2007 10:57:31  _____________________________________________________________________  External Attachment:    Type:   Image     Comment:   External Document

## 2010-05-24 NOTE — Progress Notes (Signed)
  Medications Added FLUTICASONE PROPIONATE 50 MCG/ACT SUSP (FLUTICASONE PROPIONATE) Spray 2 spray into both  nostrils once a day FUROSEMIDE 20 MG TABS (FUROSEMIDE) Take 1 tablet by mouth once a day LEVOTHYROXINE SODIUM 100 MCG TABS (LEVOTHYROXINE SODIUM) Take 1 tablet by mouth once a  day MECLIZINE HCL 25 MG TABS (MECLIZINE HCL) Take 1 tablet by mouth four times a day       Phone Note Call from Patient Call back at Work Phone [phone]   Caller: Patient Reason for Call: Lab or Test Results Summary of Call: It's been a week and still no results on the phone tree.  Initial call taken by: Flavia Shipper,  February 06, 2007 2:31 PM  Follow-up for Phone Call        need chart to respond to phone message please Follow-up by: Biagio Borg MD,  February 06, 2007 5:23 PM    New/Updated Medications: FLUTICASONE PROPIONATE 50 MCG/ACT SUSP (FLUTICASONE PROPIONATE) Spray 2 spray into both  nostrils once a day FUROSEMIDE 20 MG TABS (FUROSEMIDE) Take 1 tablet by mouth once a day LEVOTHYROXINE SODIUM 100 MCG TABS (LEVOTHYROXINE SODIUM) Take 1 tablet by mouth once a  day MECLIZINE HCL 25 MG TABS (MECLIZINE HCL) Take 1 tablet by mouth four times a day      Appended Document:  CALLED PT TO INFORM OF LAB RESULTS.SPOKE WITH PT , PT MADE AWARE

## 2010-05-24 NOTE — Progress Notes (Signed)
  Phone Note Refill Request   Refills Requested: Medication #1:  PROTONIX 40 MG  TBEC 1 by mouth qd Initial call taken by: Charlsie Quest,  August 12, 2007 3:38 PM      Prescriptions: PROTONIX 40 MG  TBEC (PANTOPRAZOLE SODIUM) 1 by mouth qd  #30 x 5   Entered by:   Elveria Royals   Authorized by:   Biagio Borg MD   Signed by:   Elveria Royals on 08/12/2007   Method used:   Electronically sent to ...       [place], [place]  [address]       Ph: [phone]       Fax: [phone]   RxID:   [id number]     Appended Document:          Additional Follow-up for Phone Call Additional follow up Details #2::    message left script sent to drugstore for protonix Follow-up by: Elveria Royals,  August 12, 2007 3:52 PM

## 2010-05-24 NOTE — Progress Notes (Signed)
Summary: Rx refill req  Phone Note Call from Patient   Caller: Patient [phone] Summary of Call: Pt called requesting refill of Lasix 20mg  Initial call taken by: Crissie Sickles, CMA,  December 23, 2009 1:15 PM    Prescriptions: FUROSEMIDE 20 MG TABS (FUROSEMIDE) Take 1 tablet by mouth once a day  #90 x 3   Entered by:   Crissie Sickles, CMA   Authorized by:   Biagio Borg MD   Signed by:   Crissie Sickles, CMA on 12/23/2009   Method used:   Electronically to        [facility] [phone]* (retail)       [address]       [place], [place]  [address]       Ph: [phone]       Fax: [phone]   RxIDVX:[id number]

## 2010-05-24 NOTE — Progress Notes (Signed)
Summary: NUCLEAR PRE-PROCEDURE  Phone Note Outgoing Call   Summary of Call: Reviewed information on Myoview Information Sheet (see scanned document for further details).  Spoke with patient.            Nuclear Med Background Indications for Stress Test: Evaluation for Ischemia, Post Hospital  Indications Comments: 01/27/09: PALP, CP, N/V, NECK PAIN,(-) ENZYMES MI R/O    Symptoms: Chest Pain, Chest Pressure, Fatigue, Nausea, Palpitations, SOB, Vomiting  Symptoms Comments: NECK PAIN   Nuclear Pre-Procedure Cardiac Risk Factors: Family History - CAD Height (in): 63.5  Nuclear Med Study Referring MD:  J.SunGard

## 2010-05-24 NOTE — Assessment & Plan Note (Signed)
Summary: Cardiology Nuclear Study  Nuclear Med Background Indications for Stress Test: Evaluation for Ischemia, [facility]  Indications Comments: 01/27/09 Chest pain>neck, nausea/vomiting;(-) enzymes  History: Myocardial Perfusion Study  History Comments: 3/09 [id number], EF=64%  Symptoms: Chest Pain, Chest Pressure, Diaphoresis, Dizziness, Fatigue, Nausea, Palpitations, SOB, Vomiting  Symptoms Comments: chest pain>neck   Nuclear Pre-Procedure Cardiac Risk Factors: Family History - CAD, Lipids, Obesity Caffeine/Decaff Intake: None NPO After: 7:30 PM Lungs: Clear IV 0.9% NS with Angio Cath: 22g     IV Site: (R) hand IV Started by: Irven Baltimore RN Chest Size (in) 36     Cup Size C     Height (in): 63.5 Weight (lb): 187 BMI: 32.72 Tech Comments: Unable to walk fast on treadmill per patient, change to adenosine.  Harriette Ohara, RN.  Nuclear Med Study 1 or 2 day study:  1 day     Stress Test Type:  Adenosine Reading MD:  Dorris Carnes, MD     Referring MD:  Cathlean Cower, MD Resting Radionuclide:  Technetium 27m Tetrofosmin     Resting Radionuclide Dose:  11.0 mCi  Stress Radionuclide:  Technetium 12m Tetrofosmin     Stress Radionuclide Dose:  33.0 mCi   Stress Protocol      Max HR:  122 bpm     Predicted Max HR:  123XX123 bpm  Max Systolic BP: 123XX123 mm Hg     Percent Max HR:  72.19 %Rate Pressure Product:  13176 Dose of Adenosine:  47.6 mg    Stress Test Technologist:  Valetta Fuller CMA-N     Nuclear Technologist:  Charlton Amor CNMT  Rest Procedure  Myocardial perfusion imaging was performed at rest 45 minutes following the intravenous administration of Myoview Technetium 44m Tetrofosmin.  Stress Procedure  The patient received IV adenosine at 140 mcg/kg/min for 4 minutes. There were no significant changes with infusion.  She did c/o chest tightness with infusion.   Myoview was injected at the 2 minute mark and quantitative spect images were obtained after a 45 minute  delay.  QPS Raw Data Images:  Soft tissue (breast, diaphragm, bowel activity) surround heart. Stress Images:  Normal perfusion. Rest Images:  Normal perfusion. Subtraction (SDS):  NO ischemia. Transient Ischemic Dilatation:  .92  (Normal <1.22)  Lung/Heart Ratio:  .44  (Normal <0.45)  Quantitative Gated Spect Images QGS EDV:  72 ml QGS ESV:  23 ml QGS EF:  69 %   Overall Impression  Exercise Capacity: Adenosine study with no exercise. BP Response: Normal blood pressure response. Clinical Symptoms: Chest tightness ECG Impression: Less than 1 mm ST depression in the inferior and lateral leads. Overall Impression: Normal perfusion.  LVEF 69%  Appended Document: Cardiology Nuclear Study LMOPT - labs negative, normal, or stable  - No Acute problem   Appended Document: Cardiology Nuclear Study pt informed

## 2010-05-24 NOTE — Assessment & Plan Note (Signed)
Summary: INNER EAR?, NOSE BLEEDING/$50/PN   Vital Signs:  Patient profile:   55 year old female Height:      63 inches Weight:      192 pounds BMI:     34.13 Temp:     97.1 degrees F oral Pulse rate:   90 / minute BP sitting:   104 / 68  (right arm) Cuff size:   large  Vitals Entered By: Charlynne Cousins CMA (August 21, 2008 3:34 PM) CC: pt c/o dizziness x one weeks, pt questions if it would be inner ear/ ab   CC:  pt c/o dizziness x one weeks and pt questions if it would be inner ear/ ab.  History of Present Illness: 1-2 weeks of rhinorrhea, and associated vertigenous-quality dizziness.  no fever or chest pain.  Current Medications (verified): 1)  Furosemide 20 Mg Tabs (Furosemide) .... Take 1 Tablet By Mouth Once A Day 2)  Levothyroxine Sodium 100 Mcg Tabs (Levothyroxine Sodium) .... Take 1 Tablet By Mouth Once A  Day 3)  Protonix 40 Mg  Tbec (Pantoprazole Sodium) .[name].. 1 By Mouth Once Daily 4)  Alprazolam 0.25 Mg  Tabs (Alprazolam) .[name].. 1 By Mouth Two Times A Day As Needed 5)  Calcium 6)  Potassium 7)  Omnaris 50 Mcg/act Susp (Ciclesonide) .... 2 Puffs Each Nostril Once Daily  Allergies (verified): 1)  ! Aspirin 2)  ! Sulfa 3)  ! Amoxicillin 4)  ! * Avelox  Past History:  Past Medical History:    Allergic rhinitis    Anxiety    Colonic polyps, hx of - adenomatous    Hypothyroidism    lumbar disc disease    Diverticulosis, colon     (07/19/2007)  Review of Systems  The patient denies syncope.         denies earache  Physical Exam  General:  obese.  no distress Head:  head: no deformity eyes: no periorbital swelling, no proptosis external nose and ears are normal mouth: no lesion seen  Ears:  right eac and tm are red.  left are normal Neck:  Supple without thyroid enlargement or tenderness. No cervical lymphadenopathy, neck masses or tracheal deviation.  Msk:  gait normal and steady   Impression & Recommendations:  Problem # 1:  ALLERGIC RHINITIS  (ICD-477.9) needs increased rx  Problem # 2:  uri  Medications Added to Medication List This Visit: 1)  Azithromycin 500 Mg Tabs (Azithromycin) .... Qd  Other Orders: Est. Patient Level III [id number])  Patient Instructions: 1)  clarinex-d 1 two times a day until better. 2)  azithromycin 500 mg once daily x 6 days  Prescriptions: AZITHROMYCIN 500 MG TABS (AZITHROMYCIN) qd  #6 x 0   Entered and Authorized by:   Donavan Foil MD   Signed by:   Donavan Foil MD on 08/21/2008   Method used:   Electronically to        [facility] [phone]* (retail)       [address]       [place], [place]  [address]       Ph: [phone]       Fax: [phone]   RxID:   [id number]

## 2010-05-24 NOTE — Assessment & Plan Note (Signed)
Summary: DR Jen Mow PT/NO EARLIER SLOT THAN 4P-CHILLS-URINE FREQ--STC   Vital Signs:  Patient profile:   55 year old female Height:      66 inches (167.64 cm) Weight:      188 pounds (85.45 kg) O2 Sat:      97 % on Room air Temp:     99.5 degrees F (37.50 degrees C) oral Pulse rate:   130 / minute BP sitting:   102 / 72  (right arm) Cuff size:   large  Vitals Entered By: Tomma Lightning (November 10, 2009 1:36 PM)  O2 Flow:  Room air CC: Chills & Urine frequency ? infection Is Patient Diabetic? No Pain Assessment Patient in pain? no        Primary Care Provider:  Biagio Borg MD  CC:  Chills & Urine frequency ? infection.  History of Present Illness: c/o dysuria  sudden onset this AM while at work - "felt fine last night and this AM" hx same - hosp 06/2009 with uro for UTI related to hypotonic bladder - +chills but no documented fever -  Current Medications (verified): 1)  Furosemide 20 Mg Tabs (Furosemide) .... Take 1 Tablet By Mouth Once A Day 2)  Levothyroxine Sodium 100 Mcg Tabs (Levothyroxine Sodium) .... Take 1 Tablet By Mouth Once A  Day 3)  Protonix 40 Mg  Tbec (Pantoprazole Sodium) .[name].. 1 By Mouth Once Daily 4)  Alprazolam 0.25 Mg  Tabs (Alprazolam) .[name].. 1 By Mouth Two Times A Day As Needed 5)  Claritin 10 Mg Tabs (Loratadine) .... As Needed 6)  Chlorhexidine Gluconate 0.12 % Soln (Chlorhexidine Gluconate) .... As Directed Mouth Wash 7)  Multivitamins  Caps (Multiple Vitamin) .... Once Daily  Allergies (verified): 1)  ! Aspirin 2)  ! Sulfa 3)  ! Amoxicillin 4)  ! Avelox (Moxifloxacin Hcl) 5)  ! * Latex  Past History:  Past Medical History: Allergic rhinitis Anxiety Colonic polyps, hx of - adenomatous Hypothyroidism lumbar disc disease Diverticulosis, colon Nephrolithiasis hypotonic bladder - hosp 06/2009 for UTI  MD roster: Andreas Newport - wrenn  Social History: work - Presenter, broadcasting stay -Network engineer and phlebotomy  Married 3 children Former Smoker Alcohol  use-no daughter moved back in  Review of Systems       The patient complains of fever.  The patient denies weight loss, chest pain, headaches, and abdominal pain.    Physical Exam  General:  alert, well-developed, well-nourished, and cooperative to examination.   mildly ill Eyes:  vision grossly intact; pupils equal, round and reactive to light.  conjunctiva and lids normal.    Ears:  normal pinnae bilaterally, without erythema, swelling, or tenderness to palpation. TMs clear, without effusion, or cerumen impaction. Hearing grossly normal bilaterally  Mouth:  teeth and gums in good repair; mucous membranes moist, without lesions or ulcers. oropharynx clear without exudate, no erythema.  Lungs:  normal respiratory effort, no intercostal retractions or use of accessory muscles; normal breath sounds bilaterally - no crackles and no wheezes.    Heart:  normal rate, regular rhythm, no murmur, and no rub. BLE without edema. Abdomen:  soft, non-tender, normal bowel sounds, no distention; no masses and no appreciable hepatomegaly or splenomegaly.  no flank tenderness to percussion Neurologic:  alert & oriented X3 and cranial nerves II-XII symetrically intact.  strength normal in all extremities, sensation intact to light touch, and gait normal. speech fluent without dysarthria or aphasia; follows commands with good comprehension.    Impression & Recommendations:  Problem # 1:  UTI (ICD-599.0)  classic symptoms by hx and known hx same with hypotonic bladder - prior hosp 06/2009 with uro reviewed in depth - though Udip unremarkable (pt reports "it never looks bad"), send for Ucx and start abx - rocephin +oral cipro phenergan for nausea - advised ER if worse - pt agrees resume flomax - needs f/u uro Her updated medication list for this problem includes:    Ciprofloxacin Hcl 500 Mg Tabs (Ciprofloxacin hcl) .[name]... 1 by mouth two times a day x 7 days  Orders: Prescription Created Electronically  [phone]) T-Culture, Urine [id number])  Problem # 2:  ATONY OF BLADDER (ICD-596.4) hosp from uro 06/2009 reviewed - resume flomax and f/u uro see UTI above  Complete Medication List: 1)  Furosemide 20 Mg Tabs (Furosemide) .... Take 1 tablet by mouth once a day 2)  Levothyroxine Sodium 100 Mcg Tabs (Levothyroxine sodium) .... Take 1 tablet by mouth once a  day 3)  Protonix 40 Mg Tbec (Pantoprazole sodium) .[name].. 1 by mouth once daily 4)  Alprazolam 0.25 Mg Tabs (Alprazolam) .[name].. 1 by mouth two times a day as needed 5)  Claritin 10 Mg Tabs (Loratadine) .... As needed 6)  Chlorhexidine Gluconate 0.12 % Soln (Chlorhexidine gluconate) .... As directed mouth wash 7)  Multivitamins Caps (Multiple vitamin) .... Once daily 8)  Ciprofloxacin Hcl 500 Mg Tabs (Ciprofloxacin hcl) .[name].. 1 by mouth two times a day x 7 days 9)  Promethazine Hcl 25 Mg Tabs (Promethazine hcl) .[name].. 1 by mouth every 4 hours as needed for nausea 10)  Flomax 0.4 Mg Caps (Tamsulosin hcl) .[name].. 1 by mouth once daily  Other Orders: UA Dipstick w/o Micro (manual) [id number])  Patient Instructions: 1)  it was good to see you today. 2)  shot of rocephin antibioitcs given in office today - 3)  resume flomax for your bladder emptying 4)  start cipro antibotics for your bladder infection - 5)  also use promethazine (phenergan) as needed for nausea 6)  your prescriptions have been electronically submitted to your pharmacy. Please take as directed. Contact our office if you believe you're having problems with the medication(s).  7)  Get plenty of rest, drink lots of clear liquids, and use Tylenol or Ibuprofen for fever and comfort. Return in 7-10 days if you're not better:sooner if you're feeling worse. 8)  if your symptoms continue to worsen (pain, fever, etc), or if you are unable take anything by mouth (pills, fluids, etc), you should go to the emergency room for further evaluation and treatment.  9)  also schedule followup woith urology,  dr. Jeffie Pollock to check on your bladder function and wether or not you need the flomax all the time Prescriptions: PROMETHAZINE HCL 25 MG TABS (PROMETHAZINE HCL) 1 by mouth every 4 hours as needed for nausea  #20 x 1   Entered and Authorized by:   Rowe Clack MD   Signed by:   Rowe Clack MD on 11/10/2009   Method used:   Electronically to        [facility] [address] [phone]* (retail)       [address]       [place], [place]  [address]       Ph: [phone] or [phone]       Fax: [phone]   RxID:   [id number] CIPROFLOXACIN HCL 500 MG TABS (CIPROFLOXACIN HCL) 1 by mouth two times a day x  7 days  #14 x 0   Entered and Authorized by:   Rowe Clack MD   Signed by:   Rowe Clack MD on 11/10/2009   Method used:   [facility]ically to        [facility] [address] [phone]* (retail)       [address]       [place], [place]  [address]       Ph: [phone] or [phone]       Fax: [phone]   RxID:   [id number]    Laboratory Results   Urine Tests    Routine Urinalysis   Color: lt. yellow Appearance: Clear Glucose: negative   (Normal Range: Negative) Bilirubin: negative   (Normal Range: Negative) Ketone: negative   (Normal Range: Negative) Spec. Gravity: <1.005   (Normal Range: 1.003-1.035) Blood: negative   (Normal Range: Negative) pH: 5.0   (Normal Range: 5.0-8.0) Protein: negative   (Normal Range: Negative) Urobilinogen: 0.2   (Normal Range: 0-1) Nitrite: negative   (Normal Range: Negative) Leukocyte Esterace: negative   (Normal Range: Negative)       Appended Document: DR JWJ PT/NO EARLIER SLOT THAN 4P-CHILLS-URINE FREQ--STC     Clinical Lists Changes  Orders: Added new Service order of Rocephin  250mg  [id number]) - Signed Added new Service order of Admin of Therapeutic Inj  intramuscular or subcutaneous [id number]) - Signed       Medication Administration  Injection # 1:     Medication: Rocephin  250mg     Diagnosis: ATONY OF BLADDER (ICD-596.4)    Route: IM    Site: RUOQ gluteus    Exp Date: 02/2012    Lot #: [id number]    Mfr: NOVAPLUS    Patient tolerated injection without complications    Given by: Tomma Lightning (November 10, 2009 2:28 PM)  Orders Added: 1)  Rocephin  250mg  [[id number]] 2)  Admin of Therapeutic Inj  intramuscular or subcutaneous [id number]

## 2010-05-28 ENCOUNTER — Inpatient Hospital Stay (INDEPENDENT_AMBULATORY_CARE_PROVIDER_SITE_OTHER)
Admission: RE | Admit: 2010-05-28 | Discharge: 2010-05-28 | Disposition: A | Discharge status: Home / Self Care | Payer: Commercial Managed Care - PPO | Source: Ambulatory Visit

## 2010-05-28 DIAGNOSIS — M549 Dorsalgia, unspecified: Secondary | ICD-10-CM

## 2010-06-08 ENCOUNTER — Encounter: Payer: Self-pay | Admitting: Internal Medicine

## 2010-06-21 NOTE — Letter (Signed)
Summary: Cindee Salt MD  Cindee Salt MD   Imported By: Bubba Hales 06/13/2010 10:15:20  _____________________________________________________________________  External Attachment:    Type:   Image     Comment:   External Document

## 2010-07-15 LAB — COMPREHENSIVE METABOLIC PANEL
ALT: 25 U/L (ref 0–35)
AST: 32 U/L (ref 0–37)
Albumin: 3.9 g/dL (ref 3.5–5.2)
Alkaline Phosphatase: 82 U/L (ref 39–117)
BUN: 10 mg/dL (ref 6–23)
CO2: 25 mEq/L (ref 19–32)
Calcium: 9 mg/dL (ref 8.4–10.5)
Chloride: 103 mEq/L (ref 96–112)
Creatinine, Ser: 0.88 mg/dL (ref 0.4–1.2)
GFR calc Af Amer: >60 mL/min (ref >60)
GFR calc non Af Amer: >60 mL/min (ref >60)
Glucose, Bld: 124 mg/dL — ABNORMAL HIGH (ref 70–99)
Potassium: 3.8 mEq/L (ref 3.5–5.1)
Sodium: 136 mEq/L (ref 135–145)
Total Bilirubin: 1.3 mg/dL — ABNORMAL HIGH (ref 0.3–1.2)
Total Protein: 6.9 g/dL (ref 6.0–8.3)

## 2010-07-15 LAB — URINE CULTURE
Colony Count: NO GROWTH
Culture: NO GROWTH
Special Requests: NEGATIVE

## 2010-07-15 LAB — GENTAMICIN LEVEL, RANDOM: Gentamicin Rm: 2.4 ug/mL

## 2010-07-15 LAB — CULTURE, BLOOD (ROUTINE X 2)
Culture: NO GROWTH
Culture: NO GROWTH

## 2010-07-15 LAB — CBC
HCT: 40.2 % (ref 36.0–46.0)
Hemoglobin: 13.2 g/dL (ref 12.0–15.0)
MCHC: 32.9 g/dL (ref 30.0–36.0)
MCV: 94.3 fL (ref 78.0–100.0)
Platelets: 186 10*3/uL (ref 150–400)
RBC: 4.26 MIL/uL (ref 3.87–5.11)
RDW: 13.4 % (ref 11.5–15.5)
WBC: 15.3 10*3/uL — ABNORMAL HIGH (ref 4.0–10.5)

## 2010-07-17 LAB — URINE CULTURE: Colony Count: 35000

## 2010-07-17 LAB — POCT URINALYSIS DIP (DEVICE)
Bilirubin Urine: NEGATIVE
Glucose, UA: NEGATIVE mg/dL
Hgb urine dipstick: NEGATIVE
Ketones, ur: NEGATIVE mg/dL
Nitrite: NEGATIVE
Protein, ur: NEGATIVE mg/dL
Specific Gravity, Urine: <=1.005 (ref 1.005–1.030)
Urobilinogen, UA: 0.2 mg/dL (ref 0.0–1.0)
pH: 5.5 (ref 5.0–8.0)

## 2010-07-28 LAB — CBC
HCT: 36 % (ref 36.0–46.0)
HCT: 41.3 % (ref 36.0–46.0)
Hemoglobin: 12.5 g/dL (ref 12.0–15.0)
Hemoglobin: 14.2 g/dL (ref 12.0–15.0)
MCHC: 34.4 g/dL (ref 30.0–36.0)
MCHC: 34.6 g/dL (ref 30.0–36.0)
MCV: 92.1 fL (ref 78.0–100.0)
MCV: 93.6 fL (ref 78.0–100.0)
Platelets: 196 10*3/uL (ref 150–400)
Platelets: 225 10*3/uL (ref 150–400)
RBC: 3.85 MIL/uL — ABNORMAL LOW (ref 3.87–5.11)
RBC: 4.49 MIL/uL (ref 3.87–5.11)
RDW: 13.3 % (ref 11.5–15.5)
RDW: 13.4 % (ref 11.5–15.5)
WBC: 4.6 10*3/uL (ref 4.0–10.5)
WBC: 7.2 10*3/uL (ref 4.0–10.5)

## 2010-07-28 LAB — BASIC METABOLIC PANEL
BUN: 10 mg/dL (ref 6–23)
CO2: 29 mEq/L (ref 19–32)
Calcium: 9.9 mg/dL (ref 8.4–10.5)
Chloride: 106 mEq/L (ref 96–112)
Creatinine, Ser: 0.88 mg/dL (ref 0.4–1.2)
GFR calc Af Amer: >60 mL/min (ref >60)
GFR calc non Af Amer: >60 mL/min (ref >60)
Glucose, Bld: 101 mg/dL — ABNORMAL HIGH (ref 70–99)
Potassium: 3.9 mEq/L (ref 3.5–5.1)
Sodium: 143 mEq/L (ref 135–145)

## 2010-07-28 LAB — POCT CARDIAC MARKERS
CKMB, poc: 1.3 ng/mL (ref 1.0–8.0)
CKMB, poc: <1 ng/mL — ABNORMAL LOW (ref 1.0–8.0)
Myoglobin, poc: 106 ng/mL (ref 12–200)
Myoglobin, poc: 45.4 ng/mL (ref 12–200)
Troponin i, poc: <0.05 ng/mL (ref 0.00–0.09)
Troponin i, poc: <0.05 ng/mL (ref 0.00–0.09)

## 2010-07-28 LAB — PROTIME-INR
INR: 0.94 (ref 0.00–1.49)
Prothrombin Time: 12.5 seconds (ref 11.6–15.2)

## 2010-07-28 LAB — DIFFERENTIAL
Basophils Absolute: 0.1 10*3/uL (ref 0.0–0.1)
Basophils Relative: 1 % (ref 0–1)
Eosinophils Absolute: 0.3 10*3/uL (ref 0.0–0.7)
Eosinophils Relative: 4 % (ref 0–5)
Lymphocytes Relative: 24 % (ref 12–46)
Lymphs Abs: 1.8 10*3/uL (ref 0.7–4.0)
Monocytes Absolute: 0.4 10*3/uL (ref 0.1–1.0)
Monocytes Relative: 6 % (ref 3–12)
Neutro Abs: 4.7 10*3/uL (ref 1.7–7.7)
Neutrophils Relative %: 65 % (ref 43–77)

## 2010-07-28 LAB — CARDIAC PANEL(CRET KIN+CKTOT+MB+TROPI)
CK, MB: 1 ng/mL (ref 0.3–4.0)
CK, MB: 1.2 ng/mL (ref 0.3–4.0)
Relative Index: INVALID (ref 0.0–2.5)
Relative Index: INVALID (ref 0.0–2.5)
Total CK: 72 U/L (ref 7–177)
Total CK: 74 U/L (ref 7–177)
Troponin I: 0.01 ng/mL (ref 0.00–0.06)
Troponin I: <0.01 ng/mL (ref 0.00–0.06)

## 2010-07-28 LAB — CK TOTAL AND CKMB (NOT AT ARMC)
CK, MB: 1.2 ng/mL (ref 0.3–4.0)
CK, MB: 1.2 ng/mL (ref 0.3–4.0)
Relative Index: 1.2 (ref 0.0–2.5)
Relative Index: INVALID (ref 0.0–2.5)
Total CK: 104 U/L (ref 7–177)
Total CK: 86 U/L (ref 7–177)

## 2010-07-28 LAB — TROPONIN I
Troponin I: <0.01 ng/mL (ref 0.00–0.06)
Troponin I: <0.01 ng/mL (ref 0.00–0.06)

## 2010-07-28 LAB — APTT: aPTT: 27 seconds (ref 24–37)

## 2010-07-28 LAB — HEPARIN LEVEL (UNFRACTIONATED)
Heparin Unfractionated: 0.38 IU/mL (ref 0.30–0.70)
Heparin Unfractionated: 0.5 IU/mL (ref 0.30–0.70)

## 2010-09-06 NOTE — Op Note (Signed)
[name], [name]              ACCOUNT NO.:  [id number]   MEDICAL RECORD NO.:  [id number]          PATIENT TYPE:  AMB   LOCATION:  DSC                          FACILITY:  [facility]   PHYSICIAN:  Richard D. Nelva Bush, M.D. DATE OF BIRTH:  [birth year]   DATE OF PROCEDURE:  DATE OF DISCHARGE:                               OPERATIVE REPORT   Ms. Fails is a pleasant 55 year old female who has chronic low back  pain, worse on the right.  She has intermittent right lower limb pain.  She has multilevel DDD, L4-5, L5-S1.   PROCEDURE:  Lumbar epidural steroid injection, L4-5 to the right.   ANESTHESIA:  Conscious sedation.   OPERATIVE NOTE:  After informed consent was signed, the patient was  brought back to the operating room, placed prone on the operating table.  Skin was cleansed with Betadine x3.  With the 20 gauge Tuohy 3.5-inch  spinal needle, I placed this on the L4-5 interspace on the right using  AP fluoroscopic imaging.  The epidural space was localized using loss of  resistance technique with the fluoroscope in the lateral position.  I  injected 1 mL of Omnipaque which confirmed good posterior epidural  spread.  No intravascular uptake noted.  A solution containing 80 mL of  Depo-Medrol was injected.  The patient tolerated the procedure very  well.  She was brought back to the recovery room in stable condition.  She will follow up with me in 2 weeks.      Richard D. Nelva Bush, M.D.  Electronically Signed     RDR/MEDQ  D:  04/21/2008  T:  04/22/2008  Job:  [id number]

## 2010-09-06 NOTE — Op Note (Signed)
[name], [name]              ACCOUNT NO.:  [id number]   MEDICAL RECORD NO.:  [id number]          PATIENT TYPE:  AMB   LOCATION:  [place]                          FACILITY:  [facility]   PHYSICIAN:  Odis Hollingshead, M.D.DATE OF BIRTH:  06/26/1955   DATE OF PROCEDURE:  12/02/2007  DATE OF DISCHARGE:                               OPERATIVE REPORT   PREOPERATIVE DIAGNOSES:  1. Left breast microcalcifications.  2. Left breast mass with nipple discharge.   POSTOPERATIVE DIAGNOSES:  1. Left breast microcalcifications.  2. Left breast mass with nipple discharge.   PROCEDURE:  1. Left breast biopsy after needle localization.  2. Excision of left breast mass.   SURGEON:  Odis Hollingshead, MD   ANESTHESIA:  General by way of LMA plus Marcaine local.   INDICATIONS:  This 55 year old female was found to have some  microcalcifications on a mammogram and a stereotactic biopsy was  attempted but was unsuccessful.  She also had noted herself some  intermittent nipple discharge which was bloody at times, as well as a  mass in the subareolar position at 4 o'clock.  She now presents for the  above procedures.   TECHNIQUE:  She was seen in the holding area and the left breast marked  with my initials.  She was then brought to the operating room, placed  supine on the operating table, and general anesthetic was administered.  The bandage on the left breast was removed and a wire was identified in  an pars and cut leaving a small amount outside the skin.  The left  breast and wire were sterilely prepped and draped.  I then made a  curvilinear incision in the lateral area of the breast extending  inferiorly as well as between the nipple areolar complex and the wire.  I raised subcutaneous flaps and then identified the wire and brought it  into the wound.  Using a sharp dissection, I excised a cone of tissue  around the wire and identified a firm subareolar mass at the 4 o'clock  position.  I  excised this sharply as well and I sent this and some  breast ducts separately to pathology.  The tissue around the wire was  then sent to the [facility] and the specimen mammogram was performed.  This demonstrated the area of concern was within the specimen.   Marcaine solution was injected in the subcutaneous tissues and in the  subdermal area.  Bleeding was controlled with electrocautery.   Once hemostasis was adequate, I then closed the wound in 2 layers  approximating the subcutaneous tissue with interrupted 3-0 Vicryl  sutures.  The skin was closed with a 4-0 Monocryl subcuticular stitch.  Steri-Strips and sterile dressing were applied.   She tolerated the procedure well without any apparent complications and  was taken to the recovery room in satisfactory condition.  She will be  given discharge instructions and pain medication and followup in the  office in a couple weeks.      Odis Hollingshead, M.D.  Electronically Signed     TJR/MEDQ  D:  12/02/2007  T:  12/03/2007  Job:  [id number]   cc:   Biagio Borg, MD  Daleen Bo Gaetano Net, M.D.  Chaney Born, M.D.  [facility]

## 2010-09-06 NOTE — H&P (Signed)
NAMESHIZUKA, EIKE              ACCOUNT NO.:  [id number]   MEDICAL RECORD NO.:  [id number]          PATIENT TYPE:  INP   LOCATION:  2019                         FACILITY:  [facility]   PHYSICIAN:  Leonel Ramsay, MD DATE OF BIRTH:  05-16-1955   DATE OF ADMISSION:  07/07/2007  DATE OF DISCHARGE:                              HISTORY & PHYSICAL   PRIMARY CARE PHYSICIAN:  Biagio Borg, MD   CHIEF COMPLAINT:  Chest pain and nausea.   HISTORY OF PRESENT ILLNESS:  This is a pleasant, 55 year old, white  female with a history of hypothyroidism as well as multiple pain  syndromes who now presents with chest pain that awoke her from sleep in  the early hours of this morning.  It has persisted intermittently  throughout the day.  It has been associated with nausea as well as some  sweating and left arm numbness with some mild shortness of breath.  She  describes the pain as a pressure, currently 6/10.  She has had no relief  with Tums and over-the-counter acid reflux medications.  She also  reports a small amount of diarrhea and denies fevers, chills or cough.  She has had a little bit of sweats, but she says she is perimenopausal.  Of note, she has recently had injections for some spider veins in her  legs last week and she also recently in January had dental work with  removal of tooth that went up into her maxillary sinus and has been off  and on antibiotics for this.  In the ED, she was given a GI cocktail  which showed no benefit in her chest pain.  She has also received some  nitroglycerin sublingual x3 without any change in her pain or vital  signs.  She was then placed on a nitroglycerin drip and received Tylenol  with GI cocktail without any relief.  She also received Dilaudid 2 mg,  Zofran which she received just recently.   PAST MEDICAL HISTORY:  1. Hypothyroidism.  2. Ovarian cyst.  3. Wisdom tooth removal.  4. Status post cholecystectomy in 2000.  5. Chronic back pain.   SOCIAL HISTORY:  The patient lives with her husband and kids.  She quit  tobacco 25 years ago.  She does not drink.  She uses no other drugs.  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   FAMILY HISTORY:  Her mother had heart problems with an irregular heart  beat, but she does not think she had a heart attack.  Father had colonic  polyps, but is otherwise alive and well.   ALLERGIES:  AVELOX.  ASPIRIN causes a rash and some swelling.  PENICILLIN AND SULFA.   MEDICATIONS:  1. Synthroid 100 mcg once a day.  2. Potassium.  3. Calcium.  4. Tylenol.  5. Tums.   REVIEW OF SYSTEMS:  Eleven systems are reviewed and are negative except  as in HPI.   PHYSICAL EXAMINATION:  VITAL SIGNS:  Temperature 98.1, pulse 92, blood  pressure 129/86, respirations 18, saturations 100% on room air.  GENERAL:  She appears in mild distress,  but is otherwise well-developed  and well-nourished.  HEENT:  Pupils equal round and reactive to light and accommodation.  She  is anicteric.  Her oropharynx is clear.  NECK:  Supple.  She has no anterior cervical, posterior cervical,  supraclavicular lymphadenopathy.  HEART:  Regular rate and rhythm.  LUNG:  Mild bibasilar crackles.  No wheeze.  ABDOMEN:  Soft, nontender, nondistended.  No hepatosplenomegaly.  EXTREMITIES:  Trace pedal edema.  NEUROLOGIC:  She is alert and oriented x3.  PSYCHIATRIC:  Flat affect.  Slow to answer questions, although she just  did receive Dilaudid.   LABORATORY DATA AND X-RAY FINDINGS:  White count 7.4, hemoglobin 12.7,  platelets 197.  Basic panel is within normal limits with a BUN and  creatinine of 11/0.76.  LFTs within normal limits.  Lipase was normal.   Chest x-ray was NAD.  EKG was normal sinus rhythm without any ST or T-  wave changes.   IMPRESSION:  This is a 55 year old female with a history of  hypothyroidism, multiple pain syndromes, now with over 24 hours of chest  pain that she describes as pressure 6-7/10 radiating  down her arms and  associated with some nausea and sweating.  She has had no relief in the  emergency room with gastrointestinal cocktail, with 3 sublingual  nitroglycerin or with a nitroglycerin drip or Dilaudid.  Nausea is a  prominent component of her symptoms here.  She did have some recent  surgery on her leg with minor injections of some spider veins and she  also had some recent dental surgery about 2 months ago.  A differential  diagnosis would be cardiac chest pain, although this would be very  atypical and she has no EKG changes and her point of care cardiac  enzymes are negative x2.  Other things in the differential would be  gastroesophageal reflux disease, achalasia or costochondritis.  She  could also have a viral illness given her pronounced nausea.   PLAN:  1. Will admit her and do repeat cardiac enzymes and EKG.  2. Will place her back on her Synthroid.  3. We will start her on Protonix for potential GERD.  4. We will re-evaluate the patient in the morning.  If she rules out      for cardiac event, she could potentially be discharged or      alternatively a cardiac stress test could be done in the morning.      At this point, I would favor noncardiac etiology of this pain.  5. The patient was noted in further evaluation to have a D-dimer of      0.7 which is positive, but only slightly so.  Given the unclear      etiology of her chest pain and her recent procedures on her lower      extremity and the borderline positive D-dimer, we will go ahead and      proceed with the CT angiogram of her chest.      Leonel Ramsay, MD  Electronically Signed     DPF/MEDQ  D:  07/08/2007  T:  07/08/2007  Job:  [id number]

## 2010-09-06 NOTE — Consult Note (Signed)
NAMEJERILYN, [name]              ACCOUNT NO.:  [id number]   MEDICAL RECORD NO.:  [id number]          PATIENT TYPE:  INP   LOCATION:  2019                         FACILITY:  [facility]   PHYSICIAN:  Carlena Bjornstad, MD, FACCDATE OF BIRTH:  1956-02-17   DATE OF CONSULTATION:  DATE OF DISCHARGE:                                 CONSULTATION   HISTORY OF PRESENT ILLNESS:  [name] is 55 years of age.  She is  admitted with chest pain.  She has had other types of pain in the past.  She tells me today that the current pain she is having is different from  pain that she has had previously.  However, there is pain to palpation  of her chest.  There is not pain with motion.  However, she has to move  repeatedly in bed to help with the discomfort.  She is tearful.  She as  very worried about this pain and is basically immobile in the bed at  this time.  Her husband is in the room at the time of the evaluation.  She also says that she has mild waves of nausea.   PAST MEDICAL HISTORY:   ALLERGIES:  AVELOX, ASPIRIN, PENICILLIN AND SULFA.   OTHER MEDICAL PROBLEMS:  See the list below.   SOCIAL HISTORY:  The patient lives with her husband.   FAMILY HISTORY:  There is no strong family history of coronary disease.   REVIEW OF SYSTEMS:  Review of systems today is primarily related to the  HPI.  Otherwise, Review of systems is negative.   PHYSICAL EXAMINATION:  VITAL SIGNS:  Blood pressure is 100/66 with a  pulse of 56.  Respirations are 19.  Temperature is 97.  O2 sat is 100%  on 2 liters.  GENERAL:  The patient is oriented to person, time and place.  Her  husband is in the room.  She appears mildly tearful and possibly  depressed.  She says that she is still having some chest discomfort.  HEENT:  Reveals no xanthelasma.  She has normal extraocular motion.  NECK:  There are no carotid bruits.  There is no jugular venous  distention.  LUNGS:  Clear.  Respiratory effort is not labored.  CARDIAC:  Exam reveals S1-S2.  There are no clicks or significant  murmurs.  ABDOMEN:  Soft.  There are no masses or bruits.  EXTREMITIES:  There is no peripheral edema.   STUDIES:  EKG reveals no diagnostic abnormalities.  Her troponin is 0.02  along with her point of care markers in the emergency room being normal.   PROBLEMS:  1. History of hypothyroidism.  2. History of ovarian cyst.  3. Back pain.  4. Allergies to Avelox penicillin, aspirin and sulfa.  5. CT scan of the chest revealing no pulmonary embolus.  However,      there is question of enlarged mediastinal/right hilar lymph nodes      and there is recommendation for a 23-month follow-up.  This follow-      up can be through her primary physicians.  6. Chest pain.  It  seems very unlikely that this chest pain is cardiac      in origin.  However, the patient is extremely worried and she is      immobile with this pain.  Based on this I feel it is appropriate to      proceed with a noninvasive workup while she is here and having the      other studies done to help treat her pain.  Will proceed with 2-D      echo and adenosine Myoview.  If these studies are normal, no      further cardiac workup will be needed.      Carlena Bjornstad, MD, [facility]  Electronically Signed     JDK/MEDQ  D:  07/08/2007  T:  07/08/2007  Job:  [id number]   cc:   Biagio Borg, MD  Carlena Bjornstad, MD, [facility]

## 2010-09-09 NOTE — Op Note (Signed)
NAME:  [name], [name]                        ACCOUNT NO.:  [id number]   MEDICAL RECORD NO.:  [id number]                   PATIENT TYPE:  AMB   LOCATION:  ENDO                                 FACILITY:  [facility]   PHYSICIAN:  Woody Seller., M.D.               DATE OF BIRTH:  Jul 19, 1955   DATE OF PROCEDURE:  12/26/2002  DATE OF DISCHARGE:                                 OPERATIVE REPORT   REFERRING PHYSICIAN:  Katherina Mires, M.D.   SUBJECTIVE:  This is 55 year old white female , who has been known to me for  many years at this time.  She has had a family history of colon polyps which  was diagnosed many years ago.  She has underwent a colonoscopy examination  in the past, and the last one was approximately five years ago.  The results  of her colonoscopy were normal.   She has not had any history of recent bleeding or abdominal pains or  discomfort that is present.  She has returned again for an evaluation of her  system and since it has been five years for a repeat colonoscopic  examination.   OBJECTIVE FINDINGS:  GENERAL:  She is a very pleasant female, appears to be  in no distress.  VITAL SIGNS:  Stable.  HEENT:  Anicteric.  NECK:  Supple.  LUNGS:  Clear to auscultation.  HEART:  Regular rate and rhythm without heaves, thrills, murmurs, or  gallops.  ABDOMEN:  Soft.  No gross tenderness to palpation.  No hepatosplenomegaly  that is noted.  DIGITAL RECTAL EXAM:  Normal.  EXTREMITIES:  No cyanosis, clubbing, or edema that is noted.   GROSS IMPRESSION:  Family history of colon polyps.   PRESENT RECOMMENDATIONS:  I will proceed with colonoscopic examination at  this time.   INFORMED CONSENT:  The patient was advised of the procedure and the  indications at this time.  She has agreed to have the procedure performed.   PREOPERATIVE PREPARATION:  The patient was brought into [facility] where the IV for IV sedating medication was started.  A  monitor was  placed on the patient to monitor the patient's vital signs and  oxygen saturation.  Nasal oxygen at two liters per minute was used and after  adequate sedation was performed, the procedure was begun.   BOWEL PREP:  The patient was given Visicol and Dulcolax as a bowel prep.  She tolerated the prep well without any complications.  The quality of the  prep was excellent.   PROCEDURE NOTE:  The instrument was advanced with the patient lying in the  left lateral position, to approximately 90 cm proximal colon to the cecum.  This was confirmed by visualization of the ileocecal valve as well as the  appendiceal orifice that was noted.  There was also transillumination that  was appreciated in the right lower quadrant region that was  present.   There appeared to be no gross abnormalities such as masses, polyps, or  stricture lesions appreciated.  The vascular pattern appeared to be well  within normal limits throughout the entire colon.  The mucosal pattern  showed no evidence of diverticular changes or granular changes that is  appreciated.  There was noted to be some increased tortuosity that was noted  but no obstructive process that was appreciated.   The instrument was gradually retracted back and when it reached into the  anal verge, the instrument was retroflexed, and there was no evidence of any  internal or obstructive lesions appreciated in the anal verge noted.  The  instrument was straightened and gradually removed per rectum without  difficulty without any evidence of external hemorrhoids appreciated.  The  patient tolerated the procedure well without any difficulties.   TREATMENT:  1. Conservative management.  2. I will have the patient follow up in the office in the next few weeks to     discuss the results of colonoscopy.  3. Would recommend repeating the procedure again in 5-10 years and the     process and depending upon these results will determine the course of      therapy.                                               Woody Seller., M.D.    JM/MEDQ  D:  12/26/2002  T:  12/26/2002  Job:  [id number]

## 2010-09-09 NOTE — H&P (Signed)
[name], [name]              ACCOUNT NO.:  [id number]   MEDICAL RECORD NO.:  [id number]          PATIENT TYPE:  AMB   LOCATION:  [place]                           FACILITY:  [facility]   PHYSICIAN:  Daleen Bo. Tomblin II, M.D.DATE OF BIRTH:  09-05-1955   DATE OF ADMISSION:  03/23/2004  DATE OF DISCHARGE:                                HISTORY & PHYSICAL   CHIEF COMPLAINT:  Pelvic pain.   HISTORY OF PRESENT ILLNESS:  This patient is a 55 year old married white  female, G3, P3 status post total vaginal hysterectomy and anterior repair in  1992 who complains of pelvic throbbing type pain since August of this year.  The pain has become progressively worse. It was centered on the left side  now is also on the right side.  She gets only partial relief with Tylenol.  She has a history of urinary retention and has recently been evaluated by  Dr. Jeffie Pollock.  However, his evaluation and treatment with The[facility] Belfast has been  unsuccessful.  An abdominopelvic CT scan on February 16, 2004 was consistent  with a 2.5 cm cyst of the left ovary.  Pelvic ultrasound on February 24, 2004  revealed a 1.8 cm simple cyst to the left ovary and a 1.9 cm simple cyst to  the right ovary.  After discussing the options, the patient is being  admitted for laparoscopy with possible unilateral salpingo-oophorectomy.  The potential risks and complications of the procedure have been discussed  preoperatively. The possibility of negative laparoscopy or recurrent pain  has also been reviewed.   PAST MEDICAL HISTORY:  1.  History of back pain cared for by Dr. Nelva Bush.  2.  History of foot pain with Dr. Paulla Dolly.  3.  History of urinary retention as above.  4.  Superficial varicosities, lower extremities.  5.  Hypothyroidism.   MEDICATIONS:  1.  Synthroid 0.1 mg daily.  2.  Urelle p.r.n.   ALLERGIES:  1.  SEPTRA leading to rash and swelling.  2.  ASPIRIN leading to rash and swelling.  3.  IBUPROFEN leading to rash, swelling and rectal  bleeding.  4.  AMPICILLIN.  5.  BIAXIN.  6.  TEMAZEPAM.  7.  AMBIEN.  8.  ALPRAZOLAM.  9.  LORCET PLUS.  10. PAXIL.  11. SULFA.  12. ERYTHROMYCIN.  13. BENADRYL.  14. TYLOX.   OBSTETRIC HISTORY:  Vaginal delivery x3.   FAMILY HISTORY:  Mental retardation in brother.  Bone cancer maternal aunts  and uncles. Chronic hypertension.  Heart disease in mother.  Hypothyroidism  in mother.   PAST SURGICAL HISTORY:  1.  Laparoscopic cholecystectomy.  2.  Exploratory laparotomy in 2000.  3.  Vaginal hysterectomy with anterior repair in 1992.   REVIEW OF SYMPTOMS:  NEUROLOGIC:  Back pain as above. CARDIO:  Chronic  hypertension.  Denies chest pain.  PULMONARY:  Denies cough.   PHYSICAL EXAMINATION:  VITAL SIGNS:  Height 5 feet 5 inches, weight 169  pounds. Blood pressure 110/70.  HEENT:  Without thyromegaly.  LUNGS:  Clear to auscultation.  HEART:  Regular rate and rhythm.  BACK:  Without CVA tenderness.  BREASTS:  Without mass, traction or discharge.  ABDOMEN:  Soft with bilateral lower quadrant tenderness without rebound or  masses.  PELVIC:  Vulva and vagina without lesions.  Mild diffuse tenderness  throughout the pelvis without palpable masses.  EXTREMITIES:  Grossly within normal limits.  NEUROLOGIC:  Grossly within normal limits.   ASSESSMENT:  Pelvic pain.   PLAN:  Laparoscopy, possible unilateral salpingo-oophorectomy.      JET/MEDQ  D:  03/21/2004  T:  03/21/2004  Job:  [id number]

## 2010-09-09 NOTE — Op Note (Signed)
[name], [name]              ACCOUNT NO.:  [id number]   MEDICAL RECORD NO.:  [id number]          PATIENT TYPE:  AMB   LOCATION:  [place]                           FACILITY:  [facility]   PHYSICIAN:  Daleen Bo. Tomblin II, M.D.DATE OF BIRTH:  05-25-1955   DATE OF PROCEDURE:  03/23/2004  DATE OF DISCHARGE:                                 OPERATIVE REPORT   PREOPERATIVE DIAGNOSIS:  Pelvic pain.   POSTOPERATIVE DIAGNOSIS:  Pelvic pain.   PROCEDURE:  Laparoscopy.   SURGEON:  Daleen Bo. Gaetano Net, M.D.   ANESTHESIA:  General with endotracheal intubation.   ESTIMATED BLOOD LOSS:  Drops.   INDICATIONS AND CONSENT:  The patient is a 55 year old married white female,  G3 P3, status post total vaginal hysterectomy and anterior repair, who has  pelvic pain.  Details are dictated in the history and physical.  Laparoscopy  is discussed with the patient.  Potential removal of one tube and ovary is  discussed if abnormal.  Potential risks and complications have been  reviewed, including but not limited to infection; bowel, bladder, or  ureteral damage; bleeding requiring transfusion of blood products with  possible transfusion reaction, HIV and hepatitis acquisition; DVT, PE, and  pneumonia; recurrent pelvic pain.  All questions have been answered, and  consent is signed on the chart.   FINDINGS:  Upper abdomen is grossly normal.  In the pelvis, there is a 0.5  cm resolving cyst on the right ovary.  The left ovary is normal.  Anterior,  posterior cul-de-sacs, pelvic sidewalls are normal.   PROCEDURE:  The patient was taken to the operating room, where she is  identified, placed in the dorsal supine position, and general anesthesia is  induced via endotracheal intubation.  She is then placed in the dorsal  lithotomy position, where she is prepped, straight-catheterized, a sponge on  a stick is placed in the vagina, and she is draped in a sterile fashion.  A  small infraumbilical incision is made and  dissection is carried out in  layers to the abdomen.  The fascia is entered and the abdomen is entered  without difficulty.  Anchoring sutures of 0 Vicryl are placed at each angle  of the fascia.  Disposable Hasson trocar sleeve is then placed and tied down  with anchoring sutures.  Pneumoperitoneum is induced and no damage to  surrounding structures is noted.  A small suprapubic incision is made and a  5 mm nondisposable trocar sleeve is placed under direct visualization  without difficulty.  The above findings are noted.  The suprapubic trocar  sleeve is then removed, good hemostasis is noted. Umbilical trocar sleeve is  removed, pneumoperitoneum is reduced, and the fascia is closed by tying the  two angle sutures in the midline.  Subcuticular 3-0 Vicryl suture is placed on the umbilical incision.  Both  incisions are injected with 0.5% plain Marcaine and Dermabond is applied to  both skin incisions.  All instruments are removed, all counts are correct.  The patient is transferred to the recovery room in stable condition.      JET/MEDQ  D:  03/23/2004  T:  03/23/2004  Job:  [id number]

## 2010-09-09 NOTE — Discharge Summary (Signed)
NAMEKAMYRN, SNELSON              ACCOUNT NO.:  [id number]   MEDICAL RECORD NO.:  [id number]          PATIENT TYPE:  INP   LOCATION:  2019                         FACILITY:  [facility]   PHYSICIAN:  Mateo Flow A. Asa Lente, MDDATE OF BIRTH:  April 30, 1955   DATE OF ADMISSION:  07/07/2007  DATE OF DISCHARGE:  07/09/2007                               DISCHARGE SUMMARY   DISCHARGE DIAGNOSIS:  Chest pain/epigastric pain.   HISTORY OF PRESENT ILLNESS:  Ms. Hendricksen is a 55 year old white female  with history of hyperthyroidism and chronic pain syndrome who presented  to the emergency room on day of admission reporting chest pain, however,  from sleep.  The patient reports pain persisted intermittently  throughout stay of admission, pain associated with nausea as well as  diaphoresis and left arm numbness with some mild shortness of breath.  The patient described pain as pressure 6/10 at the time of evaluation of  the ER.  Pain unrelieved with over-the-counter acid reflux medication  and Tums.  The patient denies any recent fever, chills, or cough.  She  has had a small amount of diarrhea.  The patient given GI cocktail in  the emergency room which showed no benefit in her chest pain.  The  patient also received three sublingual nitroglycerin tabs while in the  ER without any relief.  Evaluation in the ER revealed family history of  cardiac problems in the patient's mother.  The patient reports mother  had heart problems, although she is unsure of any diagnoses.  Chest x-  ray performed and was negative for any active disease.  EKG revealed  normal sinus rhythm without any ST or T-wave changes.  CBC and BMET were  within normal limits.  The patient was admitted at that time for further  evaluation and treatment.   PAST MEDICAL HISTORY:  1. Hypothyroidism.  2. Ovarian cyst.  3. Recent tooth removal.  4. Status post cholecystectomy in 2000.  5. Chronic back pain.   COURSE AND HOSPITALIZATION:  1. Chest pain with nausea.  Upon evaluation the next morning, the      patient reported continued 5/10 nonradiating chest pain at right      sternal border, however, on exam pain able to be reproduced.  Chest      CT revealed no evidence of PE with borderline enlarged mediastinal      and right hilar lymph nodes recommending 3 months followup.  Again      chest x-ray with no active disease, D-dimer 0.70.  Cardiac enzymes      were negative x4.  Liver functions tests were within normal limits.      The patient with no signs or symptoms of infection.  Cardiology was      asked to evaluate the patient during this hospitalization.  The      patient inquiring about need for cardiac catheterization.  The      patient seen by Dr. Ron Parker on July 08, 2007.  The patient underwent      2-D echo and adenosine stress Myoview per cardiology.  Myoview done  July 09, 2007 revealed left ventricular ejection fraction of 64%      with no perfusion defects.  A 2-D echocardiogram reading was      pending at the time of discharge.  The patient's pain with low      likelihood of cardiac etiology.  Complete medical workup was      negative for any acute findings.  Again adenosine Myoview was      negative for any ischemic disease.  The patient felt medically      stable for discharge home, instructed to continue NSAIDs and PPI.   MEDICATIONS:  At time of discharge:  1. Synthroid 100 mcg p.o. daily.  2. Ibuprofen 600 mg three times a day for pain x3 days.  3. Protonix 40 mg or Prilosec OTC at 20 mg p.o. daily.   Pertinent lab work at the time of discharge, white cell count 7.4,  platelets 197, hemoglobin 12.7, hematocrit 36.7, sodium 138, potassium  3.7, BUN 11, creatinine 0.74, AST, ALT within normal limits, albumin  3.3, lipase 28.  TSH is pending at the time of discharge, D-dimer is  0.70.   DISPOSITION:  The patient felt medically stable for discharge home by  both medicine and cardiology.  The  patient is instructed to follow up  with her primary care physician, Dr. Cathlean Cower on July 19, 2007, at  9:30 a.m. As noted above, the patient will need a repeat chest CT in 3  months to reevaluate lymphadenopathy seen on CT during this admission.      Patrici Ranks, NP      Jannifer Rodney. Asa Lente, MD  Electronically Signed    LE/MEDQ  D:  08/01/2007  T:  08/02/2007  Job:  [id number]   cc:   Al Corpus

## 2010-10-03 ENCOUNTER — Other Ambulatory Visit: Payer: Self-pay | Admitting: Obstetrics and Gynecology

## 2010-10-03 DIAGNOSIS — N644 Mastodynia: Secondary | ICD-10-CM

## 2010-10-06 ENCOUNTER — Ambulatory Visit
Admission: RE | Admit: 2010-10-06 | Discharge: 2010-10-06 | Disposition: A | Discharge status: Home / Self Care | Payer: Commercial Managed Care - PPO | Source: Ambulatory Visit | Attending: Obstetrics and Gynecology | Admitting: Obstetrics and Gynecology

## 2010-10-06 DIAGNOSIS — N644 Mastodynia: Secondary | ICD-10-CM

## 2010-12-30 ENCOUNTER — Ambulatory Visit (INDEPENDENT_AMBULATORY_CARE_PROVIDER_SITE_OTHER): Payer: Commercial Managed Care - PPO | Admitting: Internal Medicine

## 2010-12-30 ENCOUNTER — Encounter: Payer: Self-pay | Admitting: Internal Medicine

## 2010-12-30 VITALS — BP 104/70 | HR 85 | Temp 98.4°F | Ht 63.0 in | Wt 193.1 lb

## 2010-12-30 DIAGNOSIS — J019 Acute sinusitis, unspecified: Secondary | ICD-10-CM

## 2010-12-30 DIAGNOSIS — Z0001 Encounter for general adult medical examination with abnormal findings: Secondary | ICD-10-CM | POA: Insufficient documentation

## 2010-12-30 DIAGNOSIS — J309 Allergic rhinitis, unspecified: Secondary | ICD-10-CM

## 2010-12-30 DIAGNOSIS — Z0000 Encounter for general adult medical examination without abnormal findings: Secondary | ICD-10-CM

## 2010-12-30 DIAGNOSIS — R609 Edema, unspecified: Secondary | ICD-10-CM

## 2010-12-30 DIAGNOSIS — F411 Generalized anxiety disorder: Secondary | ICD-10-CM

## 2010-12-30 MED ORDER — ALPRAZOLAM 0.25 MG PO TABS: 0.2500 mg | ORAL_TABLET | Freq: Three times a day (TID) | ORAL | Status: DC | PRN

## 2010-12-30 MED ORDER — FUROSEMIDE 40 MG PO TABS: 40.0000 mg | ORAL_TABLET | Freq: Every day | ORAL | Status: DC

## 2010-12-30 MED ORDER — DOXYCYCLINE HYCLATE 100 MG PO TABS: 100.0000 mg | ORAL_TABLET | Freq: Two times a day (BID) | ORAL | Status: AC

## 2010-12-30 MED ORDER — BENZONATATE 100 MG PO CAPS: ORAL_CAPSULE | ORAL | Status: DC

## 2010-12-30 NOTE — Patient Instructions (Signed)
Take all new medications as prescribed - the antibiotic, and cough pills Continue all other medications as before, including the alprazolam refill (given hardcopy), and the lasix at 40 mg in the AM Please return in 1 mo with Lab testing done 3-5 days before

## 2010-12-31 ENCOUNTER — Encounter: Payer: Self-pay | Admitting: Internal Medicine

## 2010-12-31 NOTE — Progress Notes (Signed)
  Subjective:    Patient ID: [name], female    DOB: 1956/01/02, 55 y.o.   MRN: [id number]  HPI   Here with 3 days acute onset fever, facial pain, pressure, general weakness and malaise, and greenish d/c, with slight ST, but little to no cough and Pt denies chest pain, increased sob or doe, wheezing, orthopnea, PND, increased LE swelling, palpitations, dizziness or syncope, improved per pt after the lastix increased to 40 mg, wondering if she needs more.  Does have several wks ongoing nasal allergy symptoms with clear congestion, itch and sneeze, without fever, pain, ST, cough or wheezing, but better/conrolled when she takes her med. Denies worsening depressive symptoms, suicidal ideation, or panic, though has ongoing anxiety, not increased recently.   Pt denies new neurological symptoms such as new headache, or facial or extremity weakness or numbness   Pt denies polydipsia, polyuria,. Past Medical History  Diagnosis Date  . Allergy   . Anxiety   . Hx of adenomatous colonic polyps   . Thyroid disease     Hypothyroidism  . Lumbar disc disease   . Diverticulosis of colon   . Nephrolithiasis   . Hypotonic bladder     Hospitalized 06/2009 for UTI   Past Surgical History  Procedure Date  . Abdominal hysterectomy   . Cholecystectomy   . Ovarian cyst surgery   . Breast surgery 11/2007    Biopsy    reports that she has quit smoking. She does not have any smokeless tobacco history on file. She reports that she does not drink alcohol. Her drug history not on file. family history includes Dementia in her father and mother.  There is no history of Heart disease. Allergies  Allergen Reactions  . Amoxicillin     REACTION: rash, sob - tol kelfex and rocephn hosp 06/2009  . Aspirin     REACTION: rash, sob  . Latex   . Moxifloxacin     REACTION: hives - but tol cipro w/o adv rxn  . Sulfonamide Derivatives     REACTION: ?rash, sob - but reports bactrim tolerence   No current outpatient  prescriptions on file prior to visit.   Review of Systems Review of Systems  Constitutional: Negative for diaphoresis and unexpected weight change.  HENT: Negative for drooling and tinnitus.   Eyes: Negative for photophobia and visual disturbance.  Respiratory: Negative for choking and stridor.   Gastrointestinal: Negative for vomiting and blood in stool.  Genitourinary: Negative for hematuria and decreased urine volume.      Objective:   Physical Exam BP 104/70  Pulse 85  Temp(Src) 98.4 F (36.9 C) (Oral)  Ht 5\' 3"  (1.6 m)  Wt 193 lb 2 oz (87.601 kg)  BMI 34.21 kg/m2  SpO2 97% Physical Exam  VS noted, mild ill Constitutional: Pt appears well-developed and well-nourished.  HENT: Head: Normocephalic.  Right Ear: External ear normal.  Left Ear: External ear normal.  Bilat tm's mild erythema.  Sinus tender bilat.  Pharynx mild erythema Eyes: Conjunctivae and EOM are normal. Pupils are equal, round, and reactive to light.  Neck: Normal range of motion. Neck supple.  Cardiovascular: Normal rate and regular rhythm.   Pulmonary/Chest: Effort normal and breath sounds normal.  Neurological: Pt is alert. No cranial nerve deficit.  Skin: Skin is warm. No erythema. No LE edema Psychiatric: Pt behavior is normal. Thought content normal. 1+ nervous        Assessment & Plan:

## 2010-12-31 NOTE — Assessment & Plan Note (Signed)
stable overall by hx and exam, most recent data reviewed with pt, and pt to continue medical treatment as before  Lab Results  Component Value Date   WBC 5.2 02/14/2010   HGB 14.7 02/14/2010   HCT 42.3 02/14/2010   PLT 205.0 02/14/2010   CHOL 155 02/14/2010   TRIG 61.0 02/14/2010   HDL 49.10 02/14/2010   ALT 25 02/14/2010   AST 27 02/14/2010   NA 140 03/22/2010   K 4.0 03/22/2010   CL 102 03/22/2010   CREATININE 1.1 03/22/2010   BUN 15 03/22/2010   CO2 29 03/22/2010   TSH 0.50 03/22/2010   INR 0.94 01/27/2009

## 2010-12-31 NOTE — Assessment & Plan Note (Signed)
stable overall by hx and exam, and pt to continue medical treatment as before 

## 2010-12-31 NOTE — Assessment & Plan Note (Signed)
Mild to mod, for antibx course,  to f/u any worsening symptoms or concerns 

## 2010-12-31 NOTE — Assessment & Plan Note (Signed)
stable overall by hx and exam, , and pt to continue medical treatment as before, no need increased lasix today

## 2011-01-04 ENCOUNTER — Telehealth: Payer: Self-pay

## 2011-01-04 MED ORDER — AZITHROMYCIN 250 MG PO TABS: ORAL_TABLET | ORAL | Status: AC

## 2011-01-04 NOTE — Telephone Encounter (Signed)
She has several drug allergies, but ok to change to zpack -   Done per emr

## 2011-01-04 NOTE — Telephone Encounter (Signed)
Called patient informed prescription requested has been sent in.

## 2011-01-04 NOTE — Telephone Encounter (Signed)
Pt called stating she is only mildly improved with ABX and cough meds. Pt says that ST has resolved but she still have sinus pressure and HA. Pt is requesting advisement from MD. I advised pt that she may need to give medications more time but she stated that "the last tine I ended up in UC and I don;t think Dr Jenny Reichmann would want that again". Please advise.

## 2011-01-16 LAB — CBC
HCT: 36.7
Hemoglobin: 12.7
MCHC: 34.6
MCV: 94.1
Platelets: 197
RBC: 3.9
RDW: 12.8
WBC: 7.4

## 2011-01-16 LAB — POCT CARDIAC MARKERS
CKMB, poc: 1
CKMB, poc: <1 — ABNORMAL LOW
CKMB, poc: <1 — ABNORMAL LOW
Myoglobin, poc: 32.3
Myoglobin, poc: 37.5
Myoglobin, poc: 52.5
Operator id: 277751
Operator id: 277751
Operator id: 277751
Troponin i, poc: <0.05
Troponin i, poc: <0.05
Troponin i, poc: <0.05

## 2011-01-16 LAB — DIFFERENTIAL
Basophils Absolute: 0.1
Basophils Relative: 1
Eosinophils Absolute: 0.4
Eosinophils Relative: 6 — ABNORMAL HIGH
Lymphocytes Relative: 28
Lymphs Abs: 2
Monocytes Absolute: 0.5
Monocytes Relative: 7
Neutro Abs: 4.4
Neutrophils Relative %: 60

## 2011-01-16 LAB — TROPONIN I: Troponin I: 0.02

## 2011-01-16 LAB — COMPREHENSIVE METABOLIC PANEL
ALT: 18
AST: 20
Albumin: 3.3 — ABNORMAL LOW
Alkaline Phosphatase: 54
BUN: 11
CO2: 21
Calcium: 8.5
Chloride: 109
Creatinine, Ser: 0.74
GFR calc Af Amer: >60
GFR calc non Af Amer: >60
Glucose, Bld: 87
Potassium: 3.7
Sodium: 138
Total Bilirubin: 0.3
Total Protein: 5.9 — ABNORMAL LOW

## 2011-01-16 LAB — URINALYSIS, ROUTINE W REFLEX MICROSCOPIC
Bilirubin Urine: NEGATIVE
Glucose, UA: NEGATIVE
Hgb urine dipstick: NEGATIVE
Ketones, ur: NEGATIVE
Nitrite: NEGATIVE
Protein, ur: NEGATIVE
Specific Gravity, Urine: 1.017
Urobilinogen, UA: 0.2
pH: 6

## 2011-01-16 LAB — CK TOTAL AND CKMB (NOT AT ARMC)
CK, MB: 1.4
Relative Index: INVALID
Total CK: 71

## 2011-01-16 LAB — TSH: TSH: 2.198

## 2011-01-16 LAB — D-DIMER, QUANTITATIVE: D-Dimer, Quant: 0.7 — ABNORMAL HIGH

## 2011-01-16 LAB — LIPASE, BLOOD: Lipase: 28

## 2011-01-20 LAB — DIFFERENTIAL
Basophils Absolute: 0
Basophils Relative: 0
Eosinophils Absolute: 0.4
Eosinophils Relative: 8 — ABNORMAL HIGH
Lymphocytes Relative: 28
Lymphs Abs: 1.6
Monocytes Absolute: 0.4
Monocytes Relative: 7
Neutro Abs: 3.2
Neutrophils Relative %: 57

## 2011-01-20 LAB — CBC
HCT: 40.7
Hemoglobin: 13.7
MCHC: 33.8
MCV: 94.6
Platelets: 230
RBC: 4.3
RDW: 13.2
WBC: 5.5

## 2011-01-20 LAB — POCT HEMOGLOBIN-HEMACUE: Hemoglobin: 14

## 2011-01-20 LAB — COMPREHENSIVE METABOLIC PANEL
ALT: 24
AST: 21
Albumin: 3.8
Alkaline Phosphatase: 71
BUN: 10
CO2: 26
Calcium: 8.9
Chloride: 104
Creatinine, Ser: 0.77
GFR calc Af Amer: >60
GFR calc non Af Amer: >60
Glucose, Bld: 96
Potassium: 4.4
Sodium: 135
Total Bilirubin: 0.7
Total Protein: 6.7

## 2011-01-26 LAB — URINALYSIS, ROUTINE W REFLEX MICROSCOPIC
Bilirubin Urine: NEGATIVE
Glucose, UA: NEGATIVE mg/dL
Hgb urine dipstick: NEGATIVE
Ketones, ur: NEGATIVE mg/dL
Nitrite: NEGATIVE
Protein, ur: NEGATIVE mg/dL
Specific Gravity, Urine: 1.003 — ABNORMAL LOW (ref 1.005–1.030)
Urobilinogen, UA: 0.2 mg/dL (ref 0.0–1.0)
pH: 6 (ref 5.0–8.0)

## 2011-02-13 ENCOUNTER — Telehealth: Payer: Self-pay

## 2011-02-13 DIAGNOSIS — Z0000 Encounter for general adult medical examination without abnormal findings: Secondary | ICD-10-CM

## 2011-02-13 NOTE — Telephone Encounter (Signed)
Put order in for physical labs. 

## 2011-02-28 ENCOUNTER — Other Ambulatory Visit (INDEPENDENT_AMBULATORY_CARE_PROVIDER_SITE_OTHER): Payer: Commercial Managed Care - PPO

## 2011-02-28 DIAGNOSIS — Z0000 Encounter for general adult medical examination without abnormal findings: Secondary | ICD-10-CM

## 2011-02-28 LAB — CBC WITH DIFFERENTIAL/PLATELET
Basophils Absolute: 0 10*3/uL (ref 0.0–0.1)
Basophils Relative: 0.8 % (ref 0.0–3.0)
Eosinophils Absolute: 0.4 10*3/uL (ref 0.0–0.7)
Eosinophils Relative: 7.6 % — ABNORMAL HIGH (ref 0.0–5.0)
HCT: 42.5 % (ref 36.0–46.0)
Hemoglobin: 14.5 g/dL (ref 12.0–15.0)
Lymphocytes Relative: 32.3 % (ref 12.0–46.0)
Lymphs Abs: 1.5 10*3/uL (ref 0.7–4.0)
MCHC: 34.3 g/dL (ref 30.0–36.0)
MCV: 94.8 fl (ref 78.0–100.0)
Monocytes Absolute: 0.3 10*3/uL (ref 0.1–1.0)
Monocytes Relative: 5.7 % (ref 3.0–12.0)
Neutro Abs: 2.5 10*3/uL (ref 1.4–7.7)
Neutrophils Relative %: 53.6 % (ref 43.0–77.0)
Platelets: 221 10*3/uL (ref 150.0–400.0)
RBC: 4.48 Mil/uL (ref 3.87–5.11)
RDW: 13.1 % (ref 11.5–14.6)
WBC: 4.7 10*3/uL (ref 4.5–10.5)

## 2011-02-28 LAB — LIPID PANEL
Cholesterol: 158 mg/dL (ref 0–200)
HDL: 59.9 mg/dL (ref >39.00)
LDL Cholesterol: 87 mg/dL (ref 0–99)
Total CHOL/HDL Ratio: 3
Triglycerides: 56 mg/dL (ref 0.0–149.0)
VLDL: 11.2 mg/dL (ref 0.0–40.0)

## 2011-02-28 LAB — URINALYSIS, ROUTINE W REFLEX MICROSCOPIC
Bilirubin Urine: NEGATIVE
Hgb urine dipstick: NEGATIVE
Ketones, ur: NEGATIVE
Leukocytes, UA: NEGATIVE
Nitrite: NEGATIVE
Specific Gravity, Urine: <=1.005 (ref 1.000–1.030)
Total Protein, Urine: NEGATIVE
Urine Glucose: NEGATIVE
Urobilinogen, UA: 0.2 (ref 0.0–1.0)
pH: 7.5 (ref 5.0–8.0)

## 2011-02-28 LAB — BASIC METABOLIC PANEL
BUN: 11 mg/dL (ref 6–23)
CO2: 25 mEq/L (ref 19–32)
Calcium: 9.1 mg/dL (ref 8.4–10.5)
Chloride: 107 mEq/L (ref 96–112)
Creatinine, Ser: 0.9 mg/dL (ref 0.4–1.2)
GFR: 70.42 mL/min (ref >60.00)
Glucose, Bld: 90 mg/dL (ref 70–99)
Potassium: 3.9 mEq/L (ref 3.5–5.1)
Sodium: 139 mEq/L (ref 135–145)

## 2011-02-28 LAB — TSH: TSH: 1.77 u[IU]/mL (ref 0.35–5.50)

## 2011-02-28 LAB — HEPATIC FUNCTION PANEL
ALT: 21 U/L (ref 0–35)
AST: 22 U/L (ref 0–37)
Albumin: 4.1 g/dL (ref 3.5–5.2)
Alkaline Phosphatase: 80 U/L (ref 39–117)
Bilirubin, Direct: 0.1 mg/dL (ref 0.0–0.3)
Total Bilirubin: 0.7 mg/dL (ref 0.3–1.2)
Total Protein: 7.2 g/dL (ref 6.0–8.3)

## 2011-03-06 ENCOUNTER — Telehealth: Payer: Self-pay

## 2011-03-06 NOTE — Telephone Encounter (Signed)
Patient called requesting lab results  Please advise

## 2011-03-06 NOTE — Telephone Encounter (Signed)
All labs nov 6 completely normal

## 2011-03-07 NOTE — Telephone Encounter (Signed)
Patient informed. 

## 2011-03-29 ENCOUNTER — Ambulatory Visit (INDEPENDENT_AMBULATORY_CARE_PROVIDER_SITE_OTHER): Payer: Commercial Managed Care - PPO | Admitting: Internal Medicine

## 2011-03-29 ENCOUNTER — Encounter: Payer: Self-pay | Admitting: Internal Medicine

## 2011-03-29 VITALS — BP 110/62 | HR 82 | Temp 98.7°F | Ht 63.0 in | Wt 185.4 lb

## 2011-03-29 DIAGNOSIS — Z0000 Encounter for general adult medical examination without abnormal findings: Secondary | ICD-10-CM

## 2011-03-29 DIAGNOSIS — G8929 Other chronic pain: Secondary | ICD-10-CM

## 2011-03-29 DIAGNOSIS — E669 Obesity, unspecified: Secondary | ICD-10-CM | POA: Insufficient documentation

## 2011-03-29 DIAGNOSIS — M545 Low back pain, unspecified: Secondary | ICD-10-CM

## 2011-03-29 MED ORDER — PHENTERMINE HCL 37.5 MG PO CAPS: 37.5000 mg | ORAL_CAPSULE | ORAL | Status: DC

## 2011-03-29 NOTE — Progress Notes (Signed)
Subjective:    Patient ID: [name], female    DOB: [birth year], 55 y.o.   MRN: [id number]  HPI  Here for wellness and f/u;  Overall doing ok;  Pt denies CP, worsening SOB, DOE, wheezing, orthopnea, PND, worsening LE edema, palpitations, dizziness or syncope.  Pt denies neurological change such as new Headache, facial or extremity weakness.  Pt denies polydipsia, polyuria, or low sugar symptoms. Pt states overall good compliance with treatment and medications, good tolerability, and trying to follow lower cholesterol diet.  Pt denies worsening depressive symptoms, suicidal ideation or panic. No fever, wt loss, night sweats, loss of appetite, or other constitutional symptoms.  Pt states good ability with ADL's, low fall risk, home safety reviewed and adequate, no significant changes in hearing or vision, and occasionally active with exercise.  Pt continues to have recurring LBP without change in severity, bowel or bladder change, fever, wt loss,  worsening LE pain/numbness/weakness, gait change or falls. Sees Dr Gloris Manchester, declines cymbalta for further pain tx.   S/p recent uri/asthmatic bronchits per UC - tx with zpack, prednisone and improved.  Cant seem to lose wt though trying to reduce calories, has minimal time to be more active Past Medical History  Diagnosis Date  . Allergy   . Anxiety   . Hx of adenomatous colonic polyps   . Thyroid disease     Hypothyroidism  . Lumbar disc disease   . Diverticulosis of colon   . Nephrolithiasis   . Hypotonic bladder     Hospitalized 06/2009 for UTI  . Chronic LBP 03/29/2011  . Obesity 03/29/2011   Past Surgical History  Procedure Date  . Abdominal hysterectomy   . Cholecystectomy   . Ovarian cyst surgery   . Breast surgery 11/2007    Biopsy    reports that she has quit smoking. She does not have any smokeless tobacco history on file. She reports that she does not drink alcohol. Her drug history not on file. family history includes Dementia  in her father and mother.  There is no history of Heart disease. Allergies  Allergen Reactions  . Amoxicillin     REACTION: rash, sob - tol kelfex and rocephn hosp 06/2009  . Aspirin     REACTION: rash, sob  . Latex   . Moxifloxacin     REACTION: hives - but tol cipro w/o adv rxn  . Sulfonamide Derivatives     REACTION: ?rash, sob - but reports bactrim tolerence   Current Outpatient Prescriptions on File Prior to Visit  Medication Sig Dispense Refill  . ALPRAZolam (XANAX) 0.25 MG tablet Take 1 tablet (0.25 mg total) by mouth 3 (three) times daily as needed for anxiety.  60 tablet  3  . benzonatate (TESSALON PERLES) 100 MG capsule 1-2 tab by mouth up to three times daily as needed for cough  90 capsule  0  . furosemide (LASIX) 40 MG tablet Take 1 tablet (40 mg total) by mouth daily.  30 tablet  11  . levothyroxine (SYNTHROID, LEVOTHROID) 88 MCG tablet Take 88 mcg by mouth daily.        [name] loratadine (CLARITIN) 10 MG tablet Take 10 mg by mouth as needed.        . Multiple Vitamin (MULTIVITAMIN) tablet Take 1 tablet by mouth daily.        . pantoprazole (PROTONIX) 40 MG tablet Take 40 mg by mouth daily.         Review of Systems Review of  Systems  Constitutional: Negative for diaphoresis, activity change, appetite change and unexpected weight change.  HENT: Negative for hearing loss, ear pain, facial swelling, mouth sores and neck stiffness.   Eyes: Negative for pain, redness and visual disturbance.  Respiratory: Negative for shortness of breath and wheezing.   Cardiovascular: Negative for chest pain and palpitations.  Gastrointestinal: Negative for diarrhea, blood in stool, abdominal distention and rectal pain.  Genitourinary: Negative for hematuria, flank pain and decreased urine volume.  Musculoskeletal: Negative for myalgias and joint swelling.  Skin: Negative for color change and wound.  Neurological: Negative for syncope and numbness.  Hematological: Negative for adenopathy.    Psychiatric/Behavioral: Negative for hallucinations, self-injury, decreased concentration and agitation.      Objective:   Physical Exam BP 110/62  Pulse 82  Temp(Src) 98.7 F (37.1 C) (Oral)  Ht 5\' 3"  (1.6 m)  Wt 185 lb 6 oz (84.086 kg)  BMI 32.84 kg/m2  SpO2 98% Physical Exam  VS noted Constitutional: Pt is oriented to person, place, and time. Appears well-developed and well-nourished.  HENT:  Head: Normocephalic and atraumatic.  Right Ear: External ear normal.  Left Ear: External ear normal.  Nose: Nose normal.  Mouth/Throat: Oropharynx is clear and moist.  Eyes: Conjunctivae and EOM are normal. Pupils are equal, round, and reactive to light.  Neck: Normal range of motion. Neck supple. No JVD present. No tracheal deviation present.  Cardiovascular: Normal rate, regular rhythm, normal heart sounds and intact distal pulses.   Pulmonary/Chest: Effort normal and breath sounds normal.  Abdominal: Soft. Bowel sounds are normal. There is no tenderness.  Musculoskeletal: Normal range of motion. Exhibits no edema.  Lymphadenopathy:  Has no cervical adenopathy.  Neurological: Pt is alert and oriented to person, place, and time. Pt has normal reflexes. No cranial nerve deficit.  Skin: Skin is warm and dry. No rash noted.  Psychiatric:  Has  normal mood and affect. Behavior is normal. 1+ nervous, mild dysphoric    Assessment & Plan:

## 2011-03-29 NOTE — Assessment & Plan Note (Signed)

## 2011-03-29 NOTE — Patient Instructions (Signed)
Take all new medications as prescribed Continue all other medications as before You are otherwise up to date with prevention today Please return in 1 year for your yearly visit, or sooner if needed, with Lab testing done 3-5 days before

## 2011-04-02 ENCOUNTER — Encounter: Payer: Self-pay | Admitting: Internal Medicine

## 2011-04-02 NOTE — Assessment & Plan Note (Signed)
[place] for limited phentermine asd

## 2011-04-02 NOTE — Assessment & Plan Note (Signed)
Declines cymbalta trial

## 2011-04-09 ENCOUNTER — Emergency Department (HOSPITAL_COMMUNITY)
Admission: EM | Admit: 2011-04-09 | Discharge: 2011-04-09 | Disposition: A | Discharge status: Home / Self Care | Payer: 59 | Attending: Emergency Medicine | Admitting: Emergency Medicine

## 2011-04-09 ENCOUNTER — Emergency Department (HOSPITAL_COMMUNITY): Payer: 59

## 2011-04-09 ENCOUNTER — Encounter (HOSPITAL_COMMUNITY): Payer: Self-pay | Admitting: Adult Health

## 2011-04-09 ENCOUNTER — Other Ambulatory Visit (HOSPITAL_COMMUNITY): Payer: Commercial Managed Care - PPO

## 2011-04-09 ENCOUNTER — Other Ambulatory Visit: Payer: Self-pay

## 2011-04-09 DIAGNOSIS — R059 Cough, unspecified: Secondary | ICD-10-CM | POA: Insufficient documentation

## 2011-04-09 DIAGNOSIS — R079 Chest pain, unspecified: Secondary | ICD-10-CM | POA: Insufficient documentation

## 2011-04-09 DIAGNOSIS — R05 Cough: Secondary | ICD-10-CM | POA: Insufficient documentation

## 2011-04-09 DIAGNOSIS — M545 Low back pain, unspecified: Secondary | ICD-10-CM | POA: Insufficient documentation

## 2011-04-09 DIAGNOSIS — R300 Dysuria: Secondary | ICD-10-CM | POA: Insufficient documentation

## 2011-04-09 DIAGNOSIS — E669 Obesity, unspecified: Secondary | ICD-10-CM | POA: Insufficient documentation

## 2011-04-09 DIAGNOSIS — M519 Unspecified thoracic, thoracolumbar and lumbosacral intervertebral disc disorder: Secondary | ICD-10-CM | POA: Insufficient documentation

## 2011-04-09 DIAGNOSIS — R000 Tachycardia, unspecified: Secondary | ICD-10-CM | POA: Insufficient documentation

## 2011-04-09 DIAGNOSIS — N312 Flaccid neuropathic bladder, not elsewhere classified: Secondary | ICD-10-CM | POA: Insufficient documentation

## 2011-04-09 DIAGNOSIS — R110 Nausea: Secondary | ICD-10-CM | POA: Insufficient documentation

## 2011-04-09 DIAGNOSIS — F411 Generalized anxiety disorder: Secondary | ICD-10-CM | POA: Insufficient documentation

## 2011-04-09 DIAGNOSIS — G8929 Other chronic pain: Secondary | ICD-10-CM | POA: Insufficient documentation

## 2011-04-09 DIAGNOSIS — R109 Unspecified abdominal pain: Secondary | ICD-10-CM | POA: Insufficient documentation

## 2011-04-09 DIAGNOSIS — D72829 Elevated white blood cell count, unspecified: Secondary | ICD-10-CM

## 2011-04-09 DIAGNOSIS — E039 Hypothyroidism, unspecified: Secondary | ICD-10-CM | POA: Insufficient documentation

## 2011-04-09 LAB — URINALYSIS, ROUTINE W REFLEX MICROSCOPIC
Bilirubin Urine: NEGATIVE
Glucose, UA: NEGATIVE mg/dL
Hgb urine dipstick: NEGATIVE
Ketones, ur: NEGATIVE mg/dL
Leukocytes, UA: NEGATIVE
Nitrite: NEGATIVE
Protein, ur: NEGATIVE mg/dL
Specific Gravity, Urine: 1.013 (ref 1.005–1.030)
Urobilinogen, UA: 0.2 mg/dL (ref 0.0–1.0)
pH: 8.5 — ABNORMAL HIGH (ref 5.0–8.0)

## 2011-04-09 LAB — D-DIMER, QUANTITATIVE (NOT AT ARMC): D-Dimer, Quant: 0.59 ug/mL-FEU — ABNORMAL HIGH (ref 0.00–0.48)

## 2011-04-09 LAB — DIFFERENTIAL
Basophils Absolute: 0 10*3/uL (ref 0.0–0.1)
Basophils Relative: 0 % (ref 0–1)
Eosinophils Absolute: 0 10*3/uL (ref 0.0–0.7)
Eosinophils Relative: 0 % (ref 0–5)
Lymphocytes Relative: 2 % — ABNORMAL LOW (ref 12–46)
Lymphs Abs: 0.3 10*3/uL — ABNORMAL LOW (ref 0.7–4.0)
Monocytes Absolute: 0.3 10*3/uL (ref 0.1–1.0)
Monocytes Relative: 1 % — ABNORMAL LOW (ref 3–12)
Neutro Abs: 20.3 10*3/uL — ABNORMAL HIGH (ref 1.7–7.7)
Neutrophils Relative %: 97 % — ABNORMAL HIGH (ref 43–77)

## 2011-04-09 LAB — POCT I-STAT, CHEM 8
BUN: 11 mg/dL (ref 6–23)
Calcium, Ion: 1.15 mmol/L (ref 1.12–1.32)
Chloride: 102 mEq/L (ref 96–112)
Creatinine, Ser: 1 mg/dL (ref 0.50–1.10)
Glucose, Bld: 130 mg/dL — ABNORMAL HIGH (ref 70–99)
HCT: 43 % (ref 36.0–46.0)
Hemoglobin: 14.6 g/dL (ref 12.0–15.0)
Potassium: 3.3 mEq/L — ABNORMAL LOW (ref 3.5–5.1)
Sodium: 140 mEq/L (ref 135–145)
TCO2: 26 mmol/L (ref 0–100)

## 2011-04-09 LAB — CBC
HCT: 41.1 % (ref 36.0–46.0)
Hemoglobin: 14.2 g/dL (ref 12.0–15.0)
MCH: 31.3 pg (ref 26.0–34.0)
MCHC: 34.5 g/dL (ref 30.0–36.0)
MCV: 90.7 fL (ref 78.0–100.0)
Platelets: 213 10*3/uL (ref 150–400)
RBC: 4.53 MIL/uL (ref 3.87–5.11)
RDW: 12.8 % (ref 11.5–15.5)
WBC: 20.9 10*3/uL — ABNORMAL HIGH (ref 4.0–10.5)

## 2011-04-09 LAB — POCT I-STAT TROPONIN I: Troponin i, poc: 0.01 ng/mL (ref 0.00–0.08)

## 2011-04-09 LAB — HEMOGLOBIN AND HEMATOCRIT, BLOOD
HCT: 35.8 % — ABNORMAL LOW (ref 36.0–46.0)
Hemoglobin: 12.4 g/dL (ref 12.0–15.0)

## 2011-04-09 MED ORDER — MORPHINE SULFATE 4 MG/ML IJ SOLN: 4.0000 mg | Freq: Once | INTRAMUSCULAR | Status: AC

## 2011-04-09 MED ORDER — XENON XE 133 GAS
10.1000 | GAS_FOR_INHALATION | Freq: Once | RESPIRATORY_TRACT | Status: AC | PRN
  Administered 2011-04-09: 10.1 via RESPIRATORY_TRACT

## 2011-04-09 MED ORDER — TECHNETIUM TO 99M ALBUMIN AGGREGATED
5.0000 | Freq: Once | INTRAVENOUS | Status: AC | PRN
  Administered 2011-04-09: 5 via INTRAVENOUS

## 2011-04-09 MED ORDER — MORPHINE SULFATE 2 MG/ML IJ SOLN
INTRAMUSCULAR | Status: AC
  Administered 2011-04-09: 4 mg via INTRAVENOUS
  Filled 2011-04-09: qty 2

## 2011-04-09 MED ORDER — HYDROMORPHONE HCL PF 1 MG/ML IJ SOLN
0.5000 mg | Freq: Once | INTRAMUSCULAR | Status: AC
  Administered 2011-04-09: 15:00:00 via INTRAVENOUS
  Filled 2011-04-09: qty 1

## 2011-04-09 MED ORDER — SODIUM CHLORIDE 0.9 % IV SOLN
Freq: Once | INTRAVENOUS | Status: AC
  Administered 2011-04-09: 12:00:00 via INTRAVENOUS

## 2011-04-09 MED ORDER — ONDANSETRON HCL 4 MG/2ML IJ SOLN
4.0000 mg | Freq: Once | INTRAMUSCULAR | Status: AC
  Administered 2011-04-09: 4 mg via INTRAVENOUS
  Filled 2011-04-09: qty 2

## 2011-04-09 MED ORDER — ONDANSETRON HCL 4 MG/2ML IJ SOLN
INTRAMUSCULAR | Status: AC
  Administered 2011-04-09: 4 mg
  Filled 2011-04-09: qty 2

## 2011-04-09 MED ORDER — SODIUM CHLORIDE 0.9 % IV BOLUS (SEPSIS)
500.0000 mL | Freq: Once | INTRAVENOUS | Status: AC
  Administered 2011-04-09: 1000 mL via INTRAVENOUS

## 2011-04-09 MED ORDER — HYDROMORPHONE HCL PF 1 MG/ML IJ SOLN
0.5000 mg | Freq: Once | INTRAMUSCULAR | Status: AC
  Administered 2011-04-09: 18:00:00 via INTRAVENOUS
  Filled 2011-04-09: qty 1

## 2011-04-09 MED ORDER — SODIUM CHLORIDE 0.9 % IV BOLUS (SEPSIS)
1000.0000 mL | Freq: Once | INTRAVENOUS | Status: AC
  Administered 2011-04-09: 1000 mL via INTRAVENOUS

## 2011-04-09 NOTE — ED Notes (Signed)
Pt left to nuclear medicine.

## 2011-04-09 NOTE — ED Provider Notes (Signed)
History     CSN: [id number] Arrival date & time: 04/09/2011 10:27 AM   First MD Initiated Contact with Patient 04/09/11 1125      Chief Complaint  Patient presents with  . Chest Pain  . Flank Pain    (Consider location/radiation/quality/duration/timing/severity/associated sxs/prior treatment) Patient is a 55 y.o. female presenting with chest pain and flank pain. The history is provided by the patient.  Chest Pain The chest pain began 6 - 12 hours ago. Chest pain occurs constantly. The chest pain is unchanged. The pain is associated with breathing. The quality of the pain is described as aching and tightness. The pain does not radiate. Primary symptoms include cough, abdominal pain and nausea. Pertinent negatives for primary symptoms include no fever and no vomiting.    Flank Pain This is a new problem. The current episode started yesterday. The problem occurs constantly. The problem has been unchanged. Associated symptoms include abdominal pain, chest pain, coughing and nausea. Pertinent negatives include no chills, fever or vomiting. Associated symptoms comments: She has a remote history of kidney stones and reports similar symptoms. . The symptoms are aggravated by nothing.    Past Medical History  Diagnosis Date  . Allergy   . Anxiety   . Hx of adenomatous colonic polyps   . Thyroid disease     Hypothyroidism  . Lumbar disc disease   . Diverticulosis of colon   . Nephrolithiasis   . Hypotonic bladder     Hospitalized 06/2009 for UTI  . Chronic LBP 03/29/2011  . Obesity 03/29/2011    Past Surgical History  Procedure Date  . Abdominal hysterectomy   . Cholecystectomy   . Ovarian cyst surgery   . Breast surgery 11/2007    Biopsy    Family History  Problem Relation Age of Onset  . Dementia Mother   . Dementia Father   . Heart disease Neg Hx     History  Substance Use Topics  . Smoking status: Former Research scientist (life sciences)  . Smokeless tobacco: Not on file  . Alcohol Use: No      OB History    Grav Para Term Preterm Abortions TAB SAB Ect Mult Living                  Review of Systems  Constitutional: Negative for fever and chills.  HENT: Negative.   Respiratory: Positive for cough.   Cardiovascular: Positive for chest pain.  Gastrointestinal: Positive for nausea and abdominal pain. Negative for vomiting.  Genitourinary: Positive for dysuria and flank pain. Negative for vaginal discharge.  Musculoskeletal: Negative.   Skin: Negative.   Neurological: Negative.     Allergies  Amoxicillin; Aspirin; Latex; Moxifloxacin; and Sulfonamide derivatives  Home Medications   Current Outpatient Rx  Name Route Sig Dispense Refill  . ALPRAZOLAM 0.25 MG PO TABS Oral Take 1 tablet (0.25 mg total) by mouth 3 (three) times daily as needed for anxiety. 60 tablet 3  . CALCIUM CARBONATE 600 MG PO TABS Oral Take 600 mg by mouth daily.      . FUROSEMIDE 40 MG PO TABS Oral Take 1 tablet (40 mg total) by mouth daily. 30 tablet 11  . HYDROCODONE-ACETAMINOPHEN 5-325 MG PO TABS Oral Take 1 tablet by mouth every 6 (six) hours as needed. For pain.     . IBUPROFEN 200 MG PO TABS Oral Take 400 mg by mouth every 6 (six) hours as needed. For pain.     [name] LEVOTHYROXINE SODIUM 88 MCG PO TABS  Oral Take 88 mcg by mouth daily.      [name] LORATADINE 10 MG PO TABS Oral Take 10 mg by mouth daily.     [name] ONE-DAILY MULTI VITAMINS PO TABS Oral Take 1 tablet by mouth daily.      [name] NITROFURANTOIN MONOHYD MACRO 100 MG PO CAPS Oral Take 100 mg by mouth 2 (two) times daily.      [name] PANTOPRAZOLE SODIUM 40 MG PO TBEC Oral Take 40 mg by mouth daily.      [name] PHENTERMINE HCL 37.5 MG PO CAPS Oral Take 1 capsule (37.5 mg total) by mouth every morning. 30 capsule 2  . POTASSIUM 99 MG PO TABS Oral Take 1 tablet by mouth daily.        BP 121/72  Pulse 118  Temp(Src) 99.6 F (37.6 C) (Oral)  Resp 22  SpO2 98%  Physical Exam  Constitutional: She appears well-developed and well-nourished.  HENT:  Head:  Normocephalic.  Neck: Normal range of motion. Neck supple.  Cardiovascular: Regular rhythm.  Tachycardia present.   Pulmonary/Chest: Effort normal and breath sounds normal. She exhibits no tenderness.  Abdominal: Soft. Bowel sounds are normal. There is tenderness. There is no rebound and no guarding.       Mild suprapubic tenderness without rebound or guarding.  Genitourinary:       No CVA tenderness.  Musculoskeletal: Normal range of motion.  Neurological: She is alert. No cranial nerve deficit.  Skin: Skin is warm and dry. No rash noted.  Psychiatric: She has a normal mood and affect.    ED Course  Procedures (including critical care time)  Labs Reviewed - No data to display No results found.   No diagnosis found.    MDM  She has been kept comfortable as required. CT attempted to perform studies but question patency of IV. IV team paged to begin another access.   Multiple attempts at starting another IV failed, including ultrasound guided. Studies changed to noncontrasted abd/pel CT and VQ scan which could be accommodated with small iv in hand. CT results show possible enteritis. VQ scan pending.  Patient care left to Dr. Wilson Singer for disposition.       Leotis Shames, PA 04/09/11 [phone]

## 2011-04-09 NOTE — ED Provider Notes (Signed)
Medical screening examination/treatment/procedure(s) were performed by non-physician practitioner and as supervising physician I was immediately available for consultation/collaboration.   Alfonzo Feller, DO 04/09/11 2008

## 2011-04-09 NOTE — ED Notes (Signed)
Pt verbalizes understanding of dx instructions.

## 2011-04-09 NOTE — ED Notes (Signed)
Unable to get iv access x 2 attempt. Pt is restricted extremity on left, right arm only stick. IV team notified of need for iv placement

## 2011-04-09 NOTE — ED Notes (Signed)
[facility] is aware that patient will need VQ scan

## 2011-04-09 NOTE — ED Notes (Signed)
Reports lower right sided abdominal pain that radiates to right flank and up into center of chest. Pt reports vaginal pain and dysuria. Symptoms are associated with SOB and tachycardia 127.

## 2011-04-09 NOTE — ED Notes (Signed)
Patient transported to CT 

## 2011-04-09 NOTE — ED Notes (Signed)
Spoke with IV team to request restart of IV that is not usable for CT. They report they will not be able to restart IV to defer decsion to EDP.  Spoke with Domenick Gong, PA, she is aware of the same.

## 2011-04-09 NOTE — ED Notes (Signed)
MD at bedside attempting to place new iv site

## 2011-04-09 NOTE — ED Notes (Signed)
Pt has returned from nuclear medicine.

## 2011-04-14 ENCOUNTER — Encounter: Payer: Self-pay | Admitting: Internal Medicine

## 2011-04-14 ENCOUNTER — Ambulatory Visit (INDEPENDENT_AMBULATORY_CARE_PROVIDER_SITE_OTHER)
Admission: RE | Admit: 2011-04-14 | Discharge: 2011-04-14 | Disposition: A | Discharge status: Home / Self Care | Payer: Commercial Managed Care - PPO | Source: Ambulatory Visit | Attending: Internal Medicine | Admitting: Internal Medicine

## 2011-04-14 ENCOUNTER — Ambulatory Visit (INDEPENDENT_AMBULATORY_CARE_PROVIDER_SITE_OTHER): Payer: Commercial Managed Care - PPO | Admitting: Internal Medicine

## 2011-04-14 VITALS — BP 130/74 | HR 85 | Temp 98.4°F | Resp 16 | Ht 63.0 in | Wt 183.4 lb

## 2011-04-14 DIAGNOSIS — D72829 Elevated white blood cell count, unspecified: Secondary | ICD-10-CM

## 2011-04-14 DIAGNOSIS — J90 Pleural effusion, not elsewhere classified: Secondary | ICD-10-CM

## 2011-04-14 DIAGNOSIS — R7309 Other abnormal glucose: Secondary | ICD-10-CM

## 2011-04-14 DIAGNOSIS — E876 Hypokalemia: Secondary | ICD-10-CM

## 2011-04-14 DIAGNOSIS — N390 Urinary tract infection, site not specified: Secondary | ICD-10-CM

## 2011-04-14 DIAGNOSIS — R7302 Impaired glucose tolerance (oral): Secondary | ICD-10-CM

## 2011-04-14 MED ORDER — HYDROCODONE-ACETAMINOPHEN 5-325 MG PO TABS: 1.0000 | ORAL_TABLET | Freq: Four times a day (QID) | ORAL | Status: DC | PRN

## 2011-04-14 NOTE — Assessment & Plan Note (Addendum)
UTI vs early pyelonephritis/sepsis vs even renal stone, overall Clinically improved though still with subjective post infectious fatigue and dyspnea, exam benign today, to finish the macrobid,  to f/u any worsening symptoms or concerns  Note: total time for pt history, examination, ER chart review in the prescence of the pt, determination of significant diagnoses and plan for further eval and tx is > 40 min,  Total time with documentation > 50 min

## 2011-04-14 NOTE — Patient Instructions (Addendum)
Please go to XRAY in the Basement for the x-ray test Please go to LAB in the Basement for the blood and/or urine tests to be done at your convenience Please call the phone number [phone] (the [place]) for results of testing in 2-3 days;  When calling, simply dial the number, and when prompted enter the MRN number above (the Medical Record Number) and the # key, then the message should start. Continue all other medications as before, including finishing the macrobid

## 2011-04-15 ENCOUNTER — Encounter: Payer: Self-pay | Admitting: Internal Medicine

## 2011-04-15 DIAGNOSIS — Z87442 Personal history of urinary calculi: Secondary | ICD-10-CM | POA: Insufficient documentation

## 2011-04-15 NOTE — Assessment & Plan Note (Signed)
liekly reactive due to illness in the setting of long term obesity,  For a1c with lab draw but I think not likely to require OHA at this time.  Encouraged wt loss, diet

## 2011-04-15 NOTE — Assessment & Plan Note (Signed)
With early pulm vasc congestion likely related to acute illness as above, for f/u cxr today but doubt CHF, exam benign

## 2011-04-15 NOTE — Assessment & Plan Note (Signed)
Rather signficant likely due to uti/early urosepsis, for f/u lab (pt wishes to defer actual draw until Mon dec 24 due to logistitics and time today), but likely to be improved,  to f/u any worsening symptoms or concerns

## 2011-04-15 NOTE — Assessment & Plan Note (Signed)
Mild, likely related to primary illness, tx in ER, for f/u lab as well next draw

## 2011-04-15 NOTE — Progress Notes (Signed)
Subjective:    Patient ID: [name], female    DOB: October 21, 1955, 55 y.o.   MRN: [id number]  HPI  Here to f/u ER visit for CC: right flank pain/CP/SOB; had been seen at urgent care dec 15 and tx for UTI with macrobid course;  Had small blood on tissue with wiping and increased right flank pain so seen in ER dec 16, was mild hypotensive, UA not impressive at that time, but had elev HR/tachycardia, low K, WBC 20K, mild hyperglycemia, CT with ? Mild bowel thickening but no renal stone, small bilat pleural effusions, CXR with suggestion of vascular congestion, and low prob V/Q scan for PE.   Asked to finish her macrobid, and f/u for repeat cbc and repeat evaluation.  Since that time pt has done relatively well wtihout new symptoms, but still with significant fatigue and mild DOE.  Pt continues to have recurring LBP without change in severity, bowel or bladder change, fever, wt loss,  worsening LE pain/numbness/weakness, gait change or falls - overall no change.   Denies urinary symptoms such as dysuria, frequency, urgency,or hematuria. No further CP or abd pain, flank pain, f/c.  Pt denies wheezing, orthopnea, PND, increased LE swelling, palpitations, dizziness or syncope. Pt denies new neurological symptoms such as new headache, or facial or extremity weakness or numbness   Pt denies polydipsia, polyuria.   Past Medical History  Diagnosis Date  . Allergy   . Anxiety   . Hx of adenomatous colonic polyps   . Thyroid disease     Hypothyroidism  . Lumbar disc disease   . Diverticulosis of colon   . Nephrolithiasis   . Hypotonic bladder     Hospitalized 06/2009 for UTI  . Chronic LBP 03/29/2011  . Obesity 03/29/2011  . History of renal stone 04/15/2011   Past Surgical History  Procedure Date  . Abdominal hysterectomy   . Cholecystectomy   . Ovarian cyst surgery   . Breast surgery 11/2007    Biopsy    reports that she has quit smoking. She does not have any smokeless tobacco history on file.  She reports that she does not drink alcohol. Her drug history not on file. family history includes Dementia in her father and mother.  There is no history of Heart disease. Allergies  Allergen Reactions  . Amoxicillin     REACTION: rash, sob - tol kelfex and rocephn hosp 06/2009  . Aspirin     REACTION: rash, sob  . Latex   . Moxifloxacin     REACTION: hives - but tol cipro w/o adv rxn  . Sulfonamide Derivatives     REACTION: ?rash, sob - but reports bactrim tolerence   Current Outpatient Prescriptions on File Prior to Visit  Medication Sig Dispense Refill  . ALPRAZolam (XANAX) 0.25 MG tablet Take 1 tablet (0.25 mg total) by mouth 3 (three) times daily as needed for anxiety.  60 tablet  3  . calcium carbonate (OS-CAL) 600 MG TABS Take 600 mg by mouth daily.        . furosemide (LASIX) 40 MG tablet Take 1 tablet (40 mg total) by mouth daily.  30 tablet  11  . ibuprofen (ADVIL,MOTRIN) 200 MG tablet Take 400 mg by mouth every 6 (six) hours as needed. For pain.       [name] levothyroxine (SYNTHROID, LEVOTHROID) 88 MCG tablet Take 88 mcg by mouth daily.        [name] loratadine (CLARITIN) 10 MG tablet Take 10 mg  by mouth daily.       . Multiple Vitamin (MULTIVITAMIN) tablet Take 1 tablet by mouth daily.        . nitrofurantoin, macrocrystal-monohydrate, (MACROBID) 100 MG capsule Take 100 mg by mouth 2 (two) times daily.        . pantoprazole (PROTONIX) 40 MG tablet Take 40 mg by mouth daily.        . phentermine 37.5 MG capsule Take 1 capsule (37.5 mg total) by mouth every morning.  30 capsule  2  . Potassium 99 MG TABS Take 1 tablet by mouth daily.         Review of Systems Review of Systems  Constitutional: Negative for diaphoresis and unexpected weight change.  HENT: Negative for drooling and tinnitus.   Eyes: Negative for photophobia and visual disturbance.  Respiratory: Negative for choking and stridor.   Gastrointestinal: Negative for vomiting and blood in stool.  Genitourinary: Negative for  hematuria and decreased urine volume.  Musculoskeletal: Negative for gait problem.  Skin: Negative for color change and wound.  Neurological: Negative for tremors and numbness.  Psychiatric/Behavioral: Negative for decreased concentration. The patient is not hyperactive.      Objective:   Physical Exam BP 130/74  Pulse 85  Temp(Src) 98.4 F (36.9 C) (Oral)  Resp 16  Ht 5\' 3"  (1.6 m)  Wt 183 lb 6 oz (83.178 kg)  BMI 32.48 kg/m2  SpO2 98% Physical Exam  VS noted, fatigued but not ill appearing Constitutional: Pt appears well-developed and well-nourished.  HENT: Head: Normocephalic.  Right Ear: External ear normal.  Left Ear: External ear normal.  Eyes: Conjunctivae and EOM are normal. Pupils are equal, round, and reactive to light.  Neck: Normal range of motion. Neck supple.  Cardiovascular: Normal rate and regular rhythm.   Pulmonary/Chest: Effort normal and breath sounds normal.  Abd:  Soft, NT, non-distended, + BS, no flank tender Neurological: Pt is alert. No cranial nerve deficit.  Skin: Skin is warm. No erythema.  Psychiatric: Pt behavior is normal. Thought content normal.     Assessment & Plan:

## 2011-04-17 ENCOUNTER — Telehealth: Payer: Self-pay

## 2011-04-17 ENCOUNTER — Other Ambulatory Visit (INDEPENDENT_AMBULATORY_CARE_PROVIDER_SITE_OTHER): Payer: Commercial Managed Care - PPO

## 2011-04-17 DIAGNOSIS — E876 Hypokalemia: Secondary | ICD-10-CM

## 2011-04-17 DIAGNOSIS — R7302 Impaired glucose tolerance (oral): Secondary | ICD-10-CM

## 2011-04-17 DIAGNOSIS — R7309 Other abnormal glucose: Secondary | ICD-10-CM

## 2011-04-17 DIAGNOSIS — D72829 Elevated white blood cell count, unspecified: Secondary | ICD-10-CM

## 2011-04-17 LAB — HEMOGLOBIN A1C: Hgb A1c MFr Bld: 5.5 % (ref 4.6–6.5)

## 2011-04-17 LAB — BASIC METABOLIC PANEL
BUN: 13 mg/dL (ref 6–23)
CO2: 27 mEq/L (ref 19–32)
Calcium: 9 mg/dL (ref 8.4–10.5)
Chloride: 103 mEq/L (ref 96–112)
Creatinine, Ser: 0.9 mg/dL (ref 0.4–1.2)
GFR: 73.23 mL/min (ref >60.00)
Glucose, Bld: 94 mg/dL (ref 70–99)
Potassium: 4.5 mEq/L (ref 3.5–5.1)
Sodium: 138 mEq/L (ref 135–145)

## 2011-04-17 LAB — CBC WITH DIFFERENTIAL/PLATELET
Basophils Absolute: 0 10*3/uL (ref 0.0–0.1)
Basophils Relative: 0.7 % (ref 0.0–3.0)
Eosinophils Absolute: 0.4 10*3/uL (ref 0.0–0.7)
Eosinophils Relative: 6.8 % — ABNORMAL HIGH (ref 0.0–5.0)
HCT: 41.6 % (ref 36.0–46.0)
Hemoglobin: 14.2 g/dL (ref 12.0–15.0)
Lymphocytes Relative: 32 % (ref 12.0–46.0)
Lymphs Abs: 2 10*3/uL (ref 0.7–4.0)
MCHC: 34.3 g/dL (ref 30.0–36.0)
MCV: 94.4 fl (ref 78.0–100.0)
Monocytes Absolute: 0.3 10*3/uL (ref 0.1–1.0)
Monocytes Relative: 5.1 % (ref 3.0–12.0)
Neutro Abs: 3.5 10*3/uL (ref 1.4–7.7)
Neutrophils Relative %: 55.4 % (ref 43.0–77.0)
Platelets: 349 10*3/uL (ref 150.0–400.0)
RBC: 4.4 Mil/uL (ref 3.87–5.11)
RDW: 13.4 % (ref 11.5–14.6)
WBC: 6.3 10*3/uL (ref 4.5–10.5)

## 2011-04-17 NOTE — Telephone Encounter (Signed)
Patient came by the office requesting explanation on chest xray from results from 04/14/11, informed the patient per MD's instructions. The patient also got labs done this morning and is requesting results asap today please as concerned.

## 2011-04-20 ENCOUNTER — Telehealth: Payer: Self-pay

## 2011-04-20 NOTE — Telephone Encounter (Signed)
Patient called requesting lab results from 04/17/11. Called the patient back to inform left message to call back

## 2011-04-20 NOTE — Telephone Encounter (Signed)
Patient called back informed of results. Patient did request to have a copy faxed as well to her work at [phone]

## 2011-04-27 ENCOUNTER — Other Ambulatory Visit: Payer: Self-pay | Admitting: Internal Medicine

## 2011-06-16 ENCOUNTER — Other Ambulatory Visit: Payer: Self-pay | Admitting: Internal Medicine

## 2011-06-16 ENCOUNTER — Telehealth: Payer: Self-pay | Admitting: Internal Medicine

## 2011-06-16 DIAGNOSIS — Z0000 Encounter for general adult medical examination without abnormal findings: Secondary | ICD-10-CM

## 2011-06-16 NOTE — Telephone Encounter (Signed)
PT IS REQUESTING A REFERRAL TO HAVE A COLONOSCOPY SOON.  OK TO LMOM AT HOME NUMBER.

## 2011-06-20 NOTE — Telephone Encounter (Signed)
I ordered feb 22  Per note on EMR, [facility] has notified GI, and they will contact pt

## 2011-06-20 NOTE — Telephone Encounter (Signed)
Pt has called again requesting status of colonoscopy she was advised to schedule at last OV? Please advise

## 2011-06-21 NOTE — Telephone Encounter (Signed)
LMOVM per patient request of MD information.

## 2011-06-23 HISTORY — PX: TRANSTHORACIC ECHOCARDIOGRAM: SHX275

## 2011-07-08 ENCOUNTER — Emergency Department (HOSPITAL_COMMUNITY)
Admission: EM | Admit: 2011-07-08 | Discharge: 2011-07-08 | Disposition: A | Discharge status: Home / Self Care | Payer: 59 | Source: Home / Self Care

## 2011-07-08 ENCOUNTER — Encounter (HOSPITAL_COMMUNITY): Payer: Self-pay | Admitting: Emergency Medicine

## 2011-07-08 DIAGNOSIS — R51 Headache: Secondary | ICD-10-CM

## 2011-07-08 MED ORDER — TRAMADOL HCL 50 MG PO TABS: 50.0000 mg | ORAL_TABLET | Freq: Four times a day (QID) | ORAL | Status: AC | PRN

## 2011-07-08 MED ORDER — TRAMADOL HCL 50 MG PO TABS: 50.0000 mg | ORAL_TABLET | Freq: Four times a day (QID) | ORAL | Status: DC | PRN

## 2011-07-08 NOTE — Discharge Instructions (Signed)
If your symptoms change or worsen go immediately to the ER for further evaluation. Call Dr Jenny Reichmann on Monday for a follow up appt.   Headache, General, Unknown Cause The specific cause of your headache may not have been found today. There are many causes and types of headache. A few common ones are:  Tension headache.   Migraine.   Infections (examples: dental and sinus infections).   Bone and/or joint problems in the neck or jaw.   Depression.   Eye problems.  These headaches are not life threatening.  Headaches can sometimes be diagnosed by a patient history and a physical exam. Sometimes, lab and imaging studies (such as x-ray and/or CT scan) are used to rule out more serious problems. In some cases, a spinal tap (lumbar puncture) may be requested. There are many times when your exam and tests may be normal on the first visit even when there is a serious problem causing your headaches. Because of that, it is very important to follow up with your doctor or local clinic for further evaluation. FINDING OUT THE RESULTS OF TESTS  If a radiology test was performed, a radiologist will review your results.   You will be contacted by the emergency department or your physician if any test results require a change in your treatment plan.   Not all test results may be available during your visit. If your test results are not back during the visit, make an appointment with your caregiver to find out the results. Do not assume everything is normal if you have not heard from your caregiver or the medical facility. It is important for you to follow up on all of your test results.  HOME CARE INSTRUCTIONS   Keep follow-up appointments with your caregiver, or any specialist referral.   Only take over-the-counter or prescription medicines for pain, discomfort, or fever as directed by your caregiver.   Biofeedback, massage, or other relaxation techniques may be helpful.   Ice packs or heat applied to  the head and neck can be used. Do this three to four times per day, or as needed.   Call your doctor if you have any questions or concerns.   If you smoke, you should quit.  SEEK MEDICAL CARE IF:   You develop problems with medications prescribed.   You do not respond to or obtain relief from medications.   You have a change from the usual headache.   You develop nausea or vomiting.  SEEK IMMEDIATE MEDICAL CARE IF:   If your headache becomes severe.   You have an unexplained oral temperature above 102 F (38.9 C), or as your caregiver suggests.   You have a stiff neck.   You have loss of vision.   You have muscular weakness.   You have loss of muscular control.   You develop severe symptoms different from your first symptoms.   You start losing your balance or have trouble walking.   You feel faint or pass out.  MAKE SURE YOU:   Understand these instructions.   Will watch your condition.   Will get help right away if you are not doing well or get worse.  Document Released: 04/10/2005 Document Revised: 03/30/2011 Document Reviewed: 11/28/2007 [facility] Patient Information 2012 [place].

## 2011-07-08 NOTE — ED Notes (Signed)
Uri and throat pain or burning, onset two weeks.  C/o coughing at night, described as burning in throat, throat tight.  Denies ear pain, reports low grade fever today.  Denies vomiting diarrhea.  Patient does complain of a bad headache.  Reports pain in forehead and back of head

## 2011-07-08 NOTE — ED Provider Notes (Signed)
History     CSN: [id number]  Arrival date & time 07/08/11  1837   None     Chief Complaint  Patient presents with  . URI    (Consider location/radiation/quality/duration/timing/severity/associated sxs/prior treatment) HPI Comments: Patient presents today concerned that she has a sinus infection because of a persistent headache that she's had for the last 2 weeks. She states that the headache is in her forehead and the back of her head. It has been fairly persistent over the last 2 weeks. She has had the same headache pain in previous years. She states that this headache is not as severe as headaches that she has had in the past though it is more persistent. She denies visual changes, nausea, vomiting, dizziness or paresthesias. She also denies nasal congestion, postnasal drainage, cough or fever.  Ibuprofen improves the headache but has not completely relieved the pain. Patient also admits that she has been under a tremendous amount of stress and this may be contributing to her headache.    Past Medical History  Diagnosis Date  . Allergy   . Anxiety   . Hx of adenomatous colonic polyps   . Thyroid disease     Hypothyroidism  . Lumbar disc disease   . Diverticulosis of colon   . Nephrolithiasis   . Hypotonic bladder     Hospitalized 06/2009 for UTI  . Chronic LBP 03/29/2011  . Obesity 03/29/2011  . History of renal stone 04/15/2011    Past Surgical History  Procedure Date  . Abdominal hysterectomy   . Cholecystectomy   . Ovarian cyst surgery   . Breast surgery 11/2007    Biopsy    Family History  Problem Relation Age of Onset  . Dementia Mother   . Dementia Father   . Heart disease Neg Hx     History  Substance Use Topics  . Smoking status: Former Research scientist (life sciences)  . Smokeless tobacco: Not on file  . Alcohol Use: No    OB History    Grav Para Term Preterm Abortions TAB SAB Ect Mult Living                  Review of Systems  All other systems reviewed and are  negative.    Allergies  Amoxicillin; Aspirin; Latex; Moxifloxacin; and Sulfonamide derivatives  Home Medications   Current Outpatient Rx  Name Route Sig Dispense Refill  . BENZONATATE 100 MG PO CAPS Oral Take 100 mg by mouth 3 (three) times daily as needed.    . GUAIFENESIN ER 600 MG PO TB12 Oral Take 1,200 mg by mouth 2 (two) times daily.    [name] ALPRAZOLAM 0.25 MG PO TABS Oral Take 1 tablet (0.25 mg total) by mouth 3 (three) times daily as needed for anxiety. 60 tablet 3  . CALCIUM CARBONATE 600 MG PO TABS Oral Take 600 mg by mouth daily.      . FUROSEMIDE 40 MG PO TABS Oral Take 1 tablet (40 mg total) by mouth daily. 30 tablet 11  . LEVOTHYROXINE SODIUM 88 MCG PO TABS  TAKE ONE TABLET BY MOUTH EVERY DAY 90 tablet 3  . LORATADINE 10 MG PO TABS Oral Take 10 mg by mouth daily.     [name] ONE-DAILY MULTI VITAMINS PO TABS Oral Take 1 tablet by mouth daily.      [name] PANTOPRAZOLE SODIUM 40 MG PO TBEC Oral Take 40 mg by mouth daily.      [name] POTASSIUM 99 MG PO TABS Oral Take  1 tablet by mouth daily.      . TRAMADOL HCL 50 MG PO TABS Oral Take 1 tablet (50 mg total) by mouth every 6 (six) hours as needed for pain. 12 tablet 0    BP 118/60  Pulse 82  Temp(Src) 98.9 F (37.2 C) (Oral)  Resp 18  SpO2 97%  Physical Exam  Nursing note and vitals reviewed. Constitutional: She appears well-developed and well-nourished. No distress.  HENT:  Head: Normocephalic and atraumatic.  Right Ear: Tympanic membrane, external ear and ear canal normal.  Left Ear: Tympanic membrane, external ear and ear canal normal.  Nose: Nose normal.  Mouth/Throat: Uvula is midline, oropharynx is clear and moist and mucous membranes are normal. No oropharyngeal exudate, posterior oropharyngeal edema or posterior oropharyngeal erythema.  Eyes: Conjunctivae, EOM and lids are normal. Pupils are equal, round, and reactive to light.  Fundoscopic exam:      The right eye shows no hemorrhage and no papilledema.       The left eye  shows no hemorrhage and no papilledema.  Neck: Neck supple.  Cardiovascular: Normal rate, regular rhythm and normal heart sounds.   Pulmonary/Chest: Effort normal and breath sounds normal. No respiratory distress.  Lymphadenopathy:    She has no cervical adenopathy.  Neurological: She is alert. She has normal strength. No cranial nerve deficit. Gait normal.  Reflex Scores:      Bicep reflexes are 2+ on the right side and 2+ on the left side. Skin: Skin is warm and dry.  Psychiatric: She has a normal mood and affect.    ED Course  Procedures (including critical care time)  Labs Reviewed - No data to display No results found.   1. Headache       MDM  Pt presents with primary compaint of HA x 2 weeks. Not worse HA of her life, and has had similar HA's in the past. Only associated complaint is a sore scratchy throat for one week, and exam is neg. blood pressure reading initially was elevated but rechecked x2 was normal. Patient discharged home with prescription of tramadol and advised to follow up with her PCP in 2-3 days. To go to ER if symptoms change or worsen.         Carmel [place], [place] 07/08/11 2000

## 2011-07-08 NOTE — ED Provider Notes (Signed)
Medical screening examination/treatment/procedure(s) were performed by non-physician practitioner and as supervising physician I was immediately available for consultation/collaboration.  Burnett Kanaris, MD 07/08/11 2000

## 2011-07-10 ENCOUNTER — Encounter: Payer: Self-pay | Admitting: Internal Medicine

## 2011-07-10 ENCOUNTER — Ambulatory Visit (INDEPENDENT_AMBULATORY_CARE_PROVIDER_SITE_OTHER): Payer: 59 | Admitting: Internal Medicine

## 2011-07-10 ENCOUNTER — Ambulatory Visit (INDEPENDENT_AMBULATORY_CARE_PROVIDER_SITE_OTHER)
Admission: RE | Admit: 2011-07-10 | Discharge: 2011-07-10 | Disposition: A | Discharge status: Home / Self Care | Payer: 59 | Source: Ambulatory Visit | Attending: Internal Medicine | Admitting: Internal Medicine

## 2011-07-10 VITALS — BP 110/68 | HR 84 | Temp 98.3°F | Wt 187.8 lb

## 2011-07-10 DIAGNOSIS — S93401A Sprain of unspecified ligament of right ankle, initial encounter: Secondary | ICD-10-CM | POA: Insufficient documentation

## 2011-07-10 DIAGNOSIS — F32A Depression, unspecified: Secondary | ICD-10-CM | POA: Insufficient documentation

## 2011-07-10 DIAGNOSIS — F329 Major depressive disorder, single episode, unspecified: Secondary | ICD-10-CM

## 2011-07-10 DIAGNOSIS — J019 Acute sinusitis, unspecified: Secondary | ICD-10-CM

## 2011-07-10 DIAGNOSIS — S93409A Sprain of unspecified ligament of unspecified ankle, initial encounter: Secondary | ICD-10-CM

## 2011-07-10 DIAGNOSIS — R609 Edema, unspecified: Secondary | ICD-10-CM

## 2011-07-10 MED ORDER — AZITHROMYCIN 250 MG PO TABS: ORAL_TABLET | ORAL | Status: AC

## 2011-07-10 NOTE — Patient Instructions (Addendum)
Take all new medications as prescribed - the antibiotic Continue all other medications as before, including the fluid pill as is Please go to XRAY in the Basement for the x-ray test You will be contacted by phone if any changes need to be made immediately.  Otherwise, you will receive a letter about your results with an explanation. You will be contacted regarding the referral for: echocardiogram (heart ultrasound)

## 2011-07-10 NOTE — Assessment & Plan Note (Signed)
Mild worsening recent, verified nonsuicidal, declines counseling or other tx at this time

## 2011-07-10 NOTE — Progress Notes (Signed)
Addended by: Marybelle Killings F on: 07/10/2011 02:51 PM   Modules accepted: Orders

## 2011-07-10 NOTE — Assessment & Plan Note (Addendum)
Mild worsening today, ECG reviewed as per emr from dec 2012 , ? Venous insuff I suspect, pt very concerned, will check echo to r/o decr EF or diast dysfunction

## 2011-07-16 ENCOUNTER — Encounter: Payer: Self-pay | Admitting: Internal Medicine

## 2011-07-16 NOTE — Assessment & Plan Note (Signed)
Mild to mod, for antibx course,  to f/u any worsening symptoms or concerns 

## 2011-07-16 NOTE — Progress Notes (Signed)
Subjective:    Patient ID: [name], female    DOB: 11-17-1955, 56 y.o.   MRN: [id number]  HPI   Here with 3 days acute onset fever, facial pain, pressure, general weakness and malaise, and greenish d/c, with slight ST, but little to no cough and Pt denies chest pain, increased sob or doe, wheezing, orthopnea, PND, increased LE swelling, palpitations, dizziness or syncope.  ALso c/o general worsening of bilat LE edema, though none on exam today.  Did turn the right ankle 3 days ago with pain/swelling/bruise persisted,  Has also had mild worsening depressive symptoms, but nosuicidal ideation, or panic, though has ongoing anxiety. Past Medical History  Diagnosis Date  . Allergy   . Anxiety   . Hx of adenomatous colonic polyps   . Thyroid disease     Hypothyroidism  . Lumbar disc disease   . Diverticulosis of colon   . Nephrolithiasis   . Hypotonic bladder     Hospitalized 06/2009 for UTI  . Chronic LBP 03/29/2011  . Obesity 03/29/2011  . History of renal stone 04/15/2011   Past Surgical History  Procedure Date  . Abdominal hysterectomy   . Cholecystectomy   . Ovarian cyst surgery   . Breast surgery 11/2007    Biopsy    reports that she has quit smoking. She does not have any smokeless tobacco history on file. She reports that she does not drink alcohol or use illicit drugs. family history includes Dementia in her father and mother.  There is no history of Heart disease. Allergies  Allergen Reactions  . Amoxicillin     REACTION: rash, sob - tol kelfex and rocephn hosp 06/2009  . Aspirin     REACTION: rash, sob  . Latex   . Moxifloxacin     REACTION: hives - but tol cipro w/o adv rxn  . Sulfonamide Derivatives     REACTION: ?rash, sob - but reports bactrim tolerence   Current Outpatient Prescriptions on File Prior to Visit  Medication Sig Dispense Refill  . ALPRAZolam (XANAX) 0.25 MG tablet Take 1 tablet (0.25 mg total) by mouth 3 (three) times daily as needed for  anxiety.  60 tablet  3  . calcium carbonate (OS-CAL) 600 MG TABS Take 600 mg by mouth daily.        . furosemide (LASIX) 40 MG tablet Take 1 tablet (40 mg total) by mouth daily.  30 tablet  11  . guaiFENesin (MUCINEX) 600 MG 12 hr tablet Take 1,200 mg by mouth 2 (two) times daily.      [name] levothyroxine (SYNTHROID, LEVOTHROID) 88 MCG tablet TAKE ONE TABLET BY MOUTH EVERY DAY  90 tablet  3  . loratadine (CLARITIN) 10 MG tablet Take 10 mg by mouth daily.       . Multiple Vitamin (MULTIVITAMIN) tablet Take 1 tablet by mouth daily.        . pantoprazole (PROTONIX) 40 MG tablet Take 40 mg by mouth daily as needed.       . Potassium 99 MG TABS Take 1 tablet by mouth daily.        . traMADol (ULTRAM) 50 MG tablet Take 1 tablet (50 mg total) by mouth every 6 (six) hours as needed for pain.  12 tablet  0   Review of Systems Review of Systems  Constitutional: Negative for diaphoresis and unexpected weight change.  HENT: Negative for drooling and tinnitus.   Eyes: Negative for photophobia and visual disturbance.  Respiratory: Negative for  choking and stridor.   Gastrointestinal: Negative for vomiting and blood in stool.  Genitourinary: Negative for hematuria and decreased urine volume.  Neurological: Negative for tremors and numbness.  Psychiatric/Behavioral: Negative for decreased concentration. The patient is not hyperactive.       Objective:   Physical Exam BP 110/68  Pulse 84  Temp(Src) 98.3 F (36.8 C) (Oral)  Wt 187 lb 12.8 oz (85.186 kg)  SpO2 97% Physical Exam  VS noted, mild ill Constitutional: Pt appears well-developed and well-nourished.  HENT: Head: Normocephalic.  Right Ear: External ear normal.  Left Ear: External ear normal.  Bilat tm's mild erythema.  Sinus tender.  Pharynx mild erythema Eyes: Conjunctivae and EOM are normal. Pupils are equal, round, and reactive to light.  Neck: Normal range of motion. Neck supple.  Cardiovascular: Normal rate and regular rhythm.     Pulmonary/Chest: Effort normal and breath sounds normal.  Neurological: Pt is alert. No cranial nerve deficit.  Skin: Skin is warm. No erythema. excpept for right lateral mallealor area 1-2+ tender, swollen, mild bruise Psychiatric: Pt behavior is normal. Thought content normal. but I would estimate moderate depressed affect, mild psychomotor retarded    Assessment & Plan:

## 2011-07-16 NOTE — Assessment & Plan Note (Addendum)
Mild, for film to r/o fx, to f/u any worsening symptoms or concerns

## 2011-07-18 ENCOUNTER — Other Ambulatory Visit: Payer: Self-pay | Admitting: Physician Assistant

## 2011-07-18 ENCOUNTER — Other Ambulatory Visit (HOSPITAL_COMMUNITY): Payer: Self-pay | Admitting: Radiology

## 2011-07-18 DIAGNOSIS — R600 Localized edema: Secondary | ICD-10-CM

## 2011-07-18 DIAGNOSIS — R609 Edema, unspecified: Secondary | ICD-10-CM

## 2011-07-19 ENCOUNTER — Ambulatory Visit (HOSPITAL_COMMUNITY): Payer: 59 | Attending: Cardiology

## 2011-07-19 ENCOUNTER — Other Ambulatory Visit: Payer: Self-pay

## 2011-07-19 DIAGNOSIS — Z87891 Personal history of nicotine dependence: Secondary | ICD-10-CM | POA: Insufficient documentation

## 2011-07-19 DIAGNOSIS — R609 Edema, unspecified: Secondary | ICD-10-CM | POA: Insufficient documentation

## 2011-07-20 ENCOUNTER — Encounter: Payer: Self-pay | Admitting: Internal Medicine

## 2011-07-24 ENCOUNTER — Telehealth: Payer: Self-pay

## 2011-07-24 NOTE — Telephone Encounter (Signed)
Patient requesting results of Echo.

## 2011-07-24 NOTE — Telephone Encounter (Signed)
Letter with results sent mar 28  Echo without significant abnormality, to cont same tx

## 2011-07-25 NOTE — Telephone Encounter (Signed)
Patient informed. 

## 2011-07-25 NOTE — Telephone Encounter (Signed)
I would cont the same tx at this time, as the fluid retention was minimal on current tx at last visit, and more medication would likely lead to further side effect or orthostasis/dehydration

## 2011-07-25 NOTE — Telephone Encounter (Signed)
Patient informed of results. She informed she is now taking 40 mg of furosemide as still having fluid retention. She did get the stockings as PCP had suggested.

## 2011-08-02 ENCOUNTER — Telehealth: Payer: Self-pay | Admitting: Internal Medicine

## 2011-08-02 NOTE — Telephone Encounter (Signed)
Left a message for patient to call me. 

## 2011-08-02 NOTE — Telephone Encounter (Signed)
Spoke with patient and she states her mother and father have been diagnosed colon cancer since her colonoscopy 02/2007(normal). She states Dr. Jenny Reichmann wants her to have a colonoscopy.( her husband is having one on 09/29/10 and she would like it a day before or after his is possible) Does she need an OV or direct? Please, advise

## 2011-08-02 NOTE — Telephone Encounter (Signed)
May have direct colon, please schedule at her convenience

## 2011-08-02 NOTE — Telephone Encounter (Signed)
Pt also called Primary care regarding colonoscopy. Her chart was ordered and per instruction of last colonoscopy done 02/2007, pt should repeat colonoscopy every 7-10 years. Please advise pt on status of current colonoscopy request, thank you.

## 2011-08-03 ENCOUNTER — Encounter: Payer: Self-pay | Admitting: Internal Medicine

## 2011-08-03 NOTE — Telephone Encounter (Signed)
Spoke with patient and she wants to look at her calendar and call back to schedule.

## 2011-09-11 ENCOUNTER — Other Ambulatory Visit: Payer: Self-pay | Admitting: Internal Medicine

## 2011-09-13 ENCOUNTER — Ambulatory Visit (AMBULATORY_SURGERY_CENTER): Payer: 59 | Admitting: *Deleted

## 2011-09-13 VITALS — Ht 63.0 in | Wt 186.9 lb

## 2011-09-13 DIAGNOSIS — Z800 Family history of malignant neoplasm of digestive organs: Secondary | ICD-10-CM

## 2011-09-13 DIAGNOSIS — Z1211 Encounter for screening for malignant neoplasm of colon: Secondary | ICD-10-CM

## 2011-09-13 MED ORDER — PEG-KCL-NACL-NASULF-NA ASC-C 100 G PO SOLR: ORAL | Status: DC

## 2011-09-27 ENCOUNTER — Ambulatory Visit (AMBULATORY_SURGERY_CENTER): Payer: 59 | Admitting: Internal Medicine

## 2011-09-27 ENCOUNTER — Encounter: Payer: Self-pay | Admitting: Internal Medicine

## 2011-09-27 ENCOUNTER — Other Ambulatory Visit: Payer: Self-pay | Admitting: Obstetrics and Gynecology

## 2011-09-27 VITALS — BP 117/68 | HR 74 | Temp 98.2°F | Resp 18 | Ht 63.0 in | Wt 186.0 lb

## 2011-09-27 DIAGNOSIS — Z1211 Encounter for screening for malignant neoplasm of colon: Secondary | ICD-10-CM

## 2011-09-27 DIAGNOSIS — Z1231 Encounter for screening mammogram for malignant neoplasm of breast: Secondary | ICD-10-CM

## 2011-09-27 DIAGNOSIS — D126 Benign neoplasm of colon, unspecified: Secondary | ICD-10-CM

## 2011-09-27 DIAGNOSIS — Z800 Family history of malignant neoplasm of digestive organs: Secondary | ICD-10-CM

## 2011-09-27 MED ORDER — SODIUM CHLORIDE 0.9 % IV SOLN: 500.0000 mL | INTRAVENOUS | Status: DC

## 2011-09-27 NOTE — Op Note (Signed)
[facility] [address]. [place], [place]  [id number]  COLONOSCOPY PROCEDURE REPORT  PATIENT:  [name], [name]  MR#:  [id number] BIRTHDATE:  03/03/1956, 70 yrs. old  GENDER:  female ENDOSCOPIST:  Lowella Bandy. Olevia Perches, MD REF. BY:  Cathlean Cower, M.D. PROCEDURE DATE:  09/27/2011 PROCEDURE:  Colonoscopy with snare polypectomy ASA CLASS:  Class I INDICATIONS:  family history of colon cancer father and 3 maternal uncles with colon cancer last colon 04/2006 MEDICATIONS:   MAC sedation, administered by CRNA, propofol (Diprivan) 250 mcg  DESCRIPTION OF PROCEDURE:   After the risks and benefits and of the procedure were explained, informed consent was obtained. Digital rectal exam was performed and revealed no rectal masses. The LB [id number] endoscope was introduced through the anus and advanced to the cecum, which was identified by both the appendix and ileocecal valve.  The quality of the prep was good, using MoviPrep.  The instrument was then slowly withdrawn as the colon was fully examined. <<PROCEDUREIMAGES>>  FINDINGS:  A sessile polyp was found. 10 mm sessile polyp at 30 cm Polyp was snared without cautery. Retrieval was successful (see image6). snare polyp  Mild diverticulosis was found in the sigmoid to descending colon segments (see image2 and image1).  This was otherwise a normal examination of the colon (see image3, image4, image5, and image7).   Retroflexed views in the rectum revealed no abnormalities.    The scope was then withdrawn from the patient and the procedure completed.  COMPLICATIONS:  None ENDOSCOPIC IMPRESSION: 1) Sessile polyp 2) Mild diverticulosis in the sigmoid to descending colon segments 3) Otherwise normal examination RECOMMENDATIONS: 1) Await pathology results 2) High fiber diet.  REPEAT EXAM:  In 5 year(s) for.  ______________________________ Lowella Bandy. Olevia Perches, MD  CC:  n. eSIGNED:   Lowella Bandy. Nou Chard at 09/27/2011 09:20  AM  Johnn Hai, [id number]

## 2011-09-27 NOTE — Progress Notes (Signed)
See recovery room notes.  Maw  The pt rating LLQ abd pain 2/10 and has no nausea.  She wants to go home.  I told her she did not have to go yet.  She said she was ready to go home and would continue to pass gas.  Her abdomen is still very soft.  Maw  Patient did not experience any of the following events: a burn prior to discharge; a fall within the facility; wrong site/side/patient/procedure/implant event; or a hospital transfer or hospital admission upon discharge from the facility. [phone]) Patient did not have preoperative order for IV antibiotic SSI prophylaxis. [phone])

## 2011-09-27 NOTE — Patient Instructions (Signed)
Handouts were given to your care partner on polyps, Diverticulosis and high fiber diet.  You may resume your prior medications today.  Please call if any questions or concerns.     YOU HAD AN ENDOSCOPIC PROCEDURE TODAY AT [facility]: Refer to the procedure report that was given to you for any specific questions about what was found during the examination.  If the procedure report does not answer your questions, please call your gastroenterologist to clarify.  If you requested that your care partner not be given the details of your procedure findings, then the procedure report has been included in a sealed envelope for you to review at your convenience later.  YOU SHOULD EXPECT: Some feelings of bloating in the abdomen. Passage of more gas than usual.  Walking can help get rid of the air that was put into your GI tract during the procedure and reduce the bloating. If you had a lower endoscopy (such as a colonoscopy or flexible sigmoidoscopy) you may notice spotting of blood in your stool or on the toilet paper. If you underwent a bowel prep for your procedure, then you may not have a normal bowel movement for a few days.  DIET: Your first meal following the procedure should be a light meal and then it is ok to progress to your normal diet.  A half-sandwich or bowl of soup is an example of a good first meal.  Heavy or fried foods are harder to digest and may make you feel nauseous or bloated.  Likewise meals heavy in dairy and vegetables can cause extra gas to form and this can also increase the bloating.  Drink plenty of fluids but you should avoid alcoholic beverages for 24 hours.  ACTIVITY: Your care partner should take you home directly after the procedure.  You should plan to take it easy, moving slowly for the rest of the day.  You can resume normal activity the day after the procedure however you should NOT DRIVE or use heavy machinery for 24 hours (because of the sedation medicines  used during the test).    SYMPTOMS TO REPORT IMMEDIATELY: A gastroenterologist can be reached at any hour.  During normal business hours, 8:30 AM to 5:00 PM Monday through Friday, call [phone].  After hours and on weekends, please call the GI answering service at [phone] who will take a message and have the physician on call contact you.   Following lower endoscopy (colonoscopy or flexible sigmoidoscopy):  Excessive amounts of blood in the stool  Significant tenderness or worsening of abdominal pains  Swelling of the abdomen that is new, acute  Fever of 100F or higher    FOLLOW UP: If any biopsies were taken you will be contacted by phone or by letter within the next 1-3 weeks.  Call your gastroenterologist if you have not heard about the biopsies in 3 weeks.  Our staff will call the home number listed on your records the next business day following your procedure to check on you and address any questions or concerns that you may have at that time regarding the information given to you following your procedure. This is a courtesy call and so if there is no answer at the home number and we have not heard from you through the emergency physician on call, we will assume that you have returned to your regular daily activities without incident.  SIGNATURES/CONFIDENTIALITY: You and/or your care partner have signed paperwork which will be entered  into your electronic medical record.  These signatures attest to the fact that that the information above on your After Visit Summary has been reviewed and is understood.  Full responsibility of the confidentiality of this discharge information lies with you and/or your care-partner.

## 2011-09-28 ENCOUNTER — Telehealth: Payer: Self-pay

## 2011-09-28 NOTE — Telephone Encounter (Signed)
Left message on answering machine. 

## 2011-10-02 ENCOUNTER — Encounter: Payer: Self-pay | Admitting: Internal Medicine

## 2011-10-09 ENCOUNTER — Ambulatory Visit
Admission: RE | Admit: 2011-10-09 | Discharge: 2011-10-09 | Disposition: A | Discharge status: Home / Self Care | Payer: 59 | Source: Ambulatory Visit | Attending: Obstetrics and Gynecology | Admitting: Obstetrics and Gynecology

## 2011-10-09 DIAGNOSIS — Z1231 Encounter for screening mammogram for malignant neoplasm of breast: Secondary | ICD-10-CM

## 2011-10-12 ENCOUNTER — Other Ambulatory Visit: Payer: Self-pay | Admitting: Obstetrics and Gynecology

## 2011-10-12 DIAGNOSIS — R928 Other abnormal and inconclusive findings on diagnostic imaging of breast: Secondary | ICD-10-CM

## 2011-10-19 ENCOUNTER — Other Ambulatory Visit: Payer: Self-pay | Admitting: Internal Medicine

## 2011-10-19 ENCOUNTER — Ambulatory Visit
Admission: RE | Admit: 2011-10-19 | Discharge: 2011-10-19 | Disposition: A | Discharge status: Home / Self Care | Payer: 59 | Source: Ambulatory Visit | Attending: Obstetrics and Gynecology | Admitting: Obstetrics and Gynecology

## 2011-10-19 DIAGNOSIS — R928 Other abnormal and inconclusive findings on diagnostic imaging of breast: Secondary | ICD-10-CM

## 2011-10-23 ENCOUNTER — Ambulatory Visit (INDEPENDENT_AMBULATORY_CARE_PROVIDER_SITE_OTHER): Payer: 59 | Admitting: Internal Medicine

## 2011-10-23 ENCOUNTER — Encounter: Payer: Self-pay | Admitting: Internal Medicine

## 2011-10-23 VITALS — BP 112/84 | HR 93 | Temp 98.0°F | Ht 63.5 in | Wt 186.5 lb

## 2011-10-23 DIAGNOSIS — M79609 Pain in unspecified limb: Secondary | ICD-10-CM

## 2011-10-23 DIAGNOSIS — F411 Generalized anxiety disorder: Secondary | ICD-10-CM

## 2011-10-23 DIAGNOSIS — M7989 Other specified soft tissue disorders: Secondary | ICD-10-CM | POA: Insufficient documentation

## 2011-10-23 DIAGNOSIS — M79669 Pain in unspecified lower leg: Secondary | ICD-10-CM | POA: Insufficient documentation

## 2011-10-23 DIAGNOSIS — R7302 Impaired glucose tolerance (oral): Secondary | ICD-10-CM

## 2011-10-23 DIAGNOSIS — M79605 Pain in left leg: Secondary | ICD-10-CM

## 2011-10-23 DIAGNOSIS — R609 Edema, unspecified: Secondary | ICD-10-CM

## 2011-10-23 DIAGNOSIS — R7309 Other abnormal glucose: Secondary | ICD-10-CM

## 2011-10-23 DIAGNOSIS — M79604 Pain in right leg: Secondary | ICD-10-CM

## 2011-10-23 NOTE — Patient Instructions (Addendum)
You will be contacted regarding the referral for: nerve test for the legs Continue all other medications as before

## 2011-10-23 NOTE — Assessment & Plan Note (Addendum)
Exam benign, has not overt spine or joint issues, ? Neuropathic pain. - for ncs/emg, if neg will need vein clinic referral

## 2011-10-25 ENCOUNTER — Encounter: Payer: Self-pay | Admitting: Internal Medicine

## 2011-10-25 NOTE — Progress Notes (Signed)
Subjective:    Patient ID: [name], female    DOB: Jan 08, 1956, 56 y.o.   MRN: [id number]  HPI  Here with c/o ongoing pain to the LE's below the knees primarily, burning type, intermittent but more constant lately, right seems some worse than left  - overall mild to mod, for 2-3 months, not worse with ambulation, and denies worsening ambulation o/w such as balance.  Denies lower back/hip/knee pain, nothing else seems to make better or worse, may have some general weakness related to the pain but nothing overt such as drop foot.  Denies numbness, did try compression stockings after last visit, no help.  Discomfort interrupting sleep. Pt denies chest pain, increased sob or doe, wheezing, orthopnea, PND, increased LE swelling, palpitations, dizziness or syncope.  Pt denies new neurological symptoms such as new headache, or facial or extremity weakness.   Pt denies polydipsia, polyuria. Denies worsening depressive symptoms, suicidal ideation, or panic. Denies hyper or hypo thyroid symptoms such as voice, skin or hair change. Past Medical History  Diagnosis Date  . Allergy   . Anxiety   . Hx of adenomatous colonic polyps   . Thyroid disease     Hypothyroidism  . Lumbar disc disease   . Diverticulosis of colon   . Nephrolithiasis   . Hypotonic bladder     Hospitalized 06/2009 for UTI  . Chronic LBP 03/29/2011  . Obesity 03/29/2011  . History of renal stone 04/15/2011   Past Surgical History  Procedure Date  . Abdominal hysterectomy   . Cholecystectomy   . Ovarian cyst surgery   . Breast surgery 11/2007    Biopsy  . Cystoscopy 2008    reports that she quit smoking about 32 years ago. She has never used smokeless tobacco. She reports that she does not drink alcohol or use illicit drugs. family history includes Breast cancer in her mother; Colon cancer (age of onset:67) in her mother; Colon cancer (age of onset:68) in her father; and Dementia in her father and mother.  There is no history  of Heart disease and Stomach cancer. Allergies  Allergen Reactions  . Amoxicillin     REACTION: rash, sob - tol kelfex and rocephn hosp 06/2009  . Aspirin     REACTION: rash, sob  . Latex     Blisters on skin  . Moxifloxacin     REACTION: hives - but tol cipro w/o adv rxn  . Sulfonamide Derivatives     REACTION: ?rash, sob - but reports bactrim tolerence   Review of Systems Review of Systems  Constitutional: Negative for diaphoresis and unexpected weight change.  HENT: Negative for tinnitus.   Eyes: Negative for photophobia and visual disturbance.  Respiratory: Negative for choking and stridor.   Gastrointestinal: Negative for vomiting and blood in stool.  Genitourinary: Negative for hematuria and decreased urine volume.  Musculoskeletal: Negative for joint pain or swelling  Skin: Negative for color change and wound.  Neurological: Negative for tremors and numbness.  Psychiatric/Behavioral: Negative for decreased concentration. The patient is not hyperactive.      Objective:   Physical Exam BP 112/84  Pulse 93  Temp 98 F (36.7 C) (Oral)  Ht 5' 3.5" (1.613 m)  Wt 186 lb 8 oz (84.596 kg)  BMI 32.52 kg/m2  SpO2 98% Physical Exam  VS noted Constitutional: Pt appears well-developed and well-nourished.  HENT: Head: Normocephalic.  Right Ear: External ear normal.  Left Ear: External ear normal.  Eyes: Conjunctivae and EOM are normal.  Pupils are equal, round, and reactive to light.  Neck: Normal range of motion. Neck supple.  Cardiovascular: Normal rate and regular rhythm.   Pulmonary/Chest: Effort normal and breath sounds normal.  Neurological: Pt is alert. No cranial nerve deficit. motor/sens/dtr/gait intact Spine: nontender; hip and knee without swelling or tender, with FROM Skin: Skin is warm. No erythema. trace pedal edema bilat; has numerous LE bilat varicosities Psychiatric: Pt behavior is normal. Thought content normal. 1+nervous    Assessment & Plan:

## 2011-10-25 NOTE — Assessment & Plan Note (Signed)
stable overall by hx and exam, and pt to continue medical treatment as before 

## 2011-10-25 NOTE — Assessment & Plan Note (Signed)
stable overall by hx and exam, most recent data reviewed with pt, and pt to continue medical treatment as before Lab Results  Component Value Date   HGBA1C 5.5 04/17/2011

## 2011-10-25 NOTE — Assessment & Plan Note (Signed)
stable overall by hx and exam, most recent data reviewed with pt, and pt to continue medical treatment as before Lab Results  Component Value Date   WBC 6.3 04/17/2011   HGB 14.2 04/17/2011   HCT 41.6 04/17/2011   PLT 349.0 04/17/2011   GLUCOSE 94 04/17/2011   CHOL 158 02/28/2011   TRIG 56.0 02/28/2011   HDL 59.90 02/28/2011   LDLCALC 87 02/28/2011   ALT 21 02/28/2011   AST 22 02/28/2011   NA 138 04/17/2011   K 4.5 04/17/2011   CL 103 04/17/2011   CREATININE 0.9 04/17/2011   BUN 13 04/17/2011   CO2 27 04/17/2011   TSH 1.77 02/28/2011   INR 0.94 01/27/2009   HGBA1C 5.5 04/17/2011

## 2011-10-26 ENCOUNTER — Emergency Department (HOSPITAL_COMMUNITY)
Admission: EM | Admit: 2011-10-26 | Discharge: 2011-10-26 | Disposition: A | Discharge status: Home / Self Care | Payer: 59 | Source: Home / Self Care | Attending: Family Medicine | Admitting: Family Medicine

## 2011-10-26 ENCOUNTER — Encounter (HOSPITAL_COMMUNITY): Payer: Self-pay

## 2011-10-26 DIAGNOSIS — L738 Other specified follicular disorders: Secondary | ICD-10-CM

## 2011-10-26 DIAGNOSIS — L739 Follicular disorder, unspecified: Secondary | ICD-10-CM

## 2011-10-26 MED ORDER — MUPIROCIN CALCIUM 2 % EX CREA: TOPICAL_CREAM | Freq: Three times a day (TID) | CUTANEOUS | Status: AC

## 2011-10-26 MED ORDER — DOXYCYCLINE HYCLATE 100 MG PO CAPS: 100.0000 mg | ORAL_CAPSULE | Freq: Two times a day (BID) | ORAL | Status: AC

## 2011-10-26 NOTE — ED Provider Notes (Signed)
History     CSN: [id number]  Arrival date & time 10/26/11  [id number]   First MD Initiated Contact with Patient 10/26/11 1842      Chief Complaint  Patient presents with  . Insect Bite    (Consider location/radiation/quality/duration/timing/severity/associated sxs/prior treatment) HPI Comments: 56 year old nondiabetic female here complaining of a pruriginous rash in her left groin that started to develop tender areas with pus. Symptoms started yesterday. Patient has the impression that she had insect bites while sitting at her mother's porch yesterday which led her to scratching the area that is now getting infected. Denies fever or chills. Patient concerned as she will be leaving to the beach later today for one week.   Past Medical History  Diagnosis Date  . Allergy   . Anxiety   . Hx of adenomatous colonic polyps   . Thyroid disease     Hypothyroidism  . Lumbar disc disease   . Diverticulosis of colon   . Nephrolithiasis   . Hypotonic bladder     Hospitalized 06/2009 for UTI  . Chronic LBP 03/29/2011  . Obesity 03/29/2011  . History of renal stone 04/15/2011    Past Surgical History  Procedure Date  . Abdominal hysterectomy   . Cholecystectomy   . Ovarian cyst surgery   . Breast surgery 11/2007    Biopsy  . Cystoscopy 2008    Family History  Problem Relation Age of Onset  . Dementia Mother   . Colon cancer Mother 27  . Breast cancer Mother   . Dementia Father   . Colon cancer Father 64  . Heart disease Neg Hx   . Stomach cancer Neg Hx     History  Substance Use Topics  . Smoking status: Former Smoker    Quit date: 09/13/1979  . Smokeless tobacco: Never Used  . Alcohol Use: No    OB History    Grav Para Term Preterm Abortions TAB SAB Ect Mult Living                  Review of Systems  Constitutional: Negative for fever and chills.       10 systems reviewed and  pertinent negative and positive symptoms are as per HPI.     Skin:       As per history  of present illness  All other systems reviewed and are negative.    Allergies  Amoxicillin; Aspirin; Latex; Moxifloxacin; and Sulfonamide derivatives  Home Medications   Current Outpatient Rx  Name Route Sig Dispense Refill  . ALPRAZOLAM 0.25 MG PO TABS Oral Take 1 tablet (0.25 mg total) by mouth 3 (three) times daily as needed for anxiety. 60 tablet 3  . CALCIUM CARBONATE 600 MG PO TABS Oral Take 600 mg by mouth daily.      . FUROSEMIDE 40 MG PO TABS Oral Take 1 tablet (40 mg total) by mouth daily. 30 tablet 11  . LEVOTHYROXINE SODIUM 88 MCG PO TABS  TAKE ONE TABLET BY MOUTH EVERY DAY 90 tablet 3  . LORATADINE 10 MG PO TABS Oral Take 10 mg by mouth daily.     [name] ONE-DAILY MULTI VITAMINS PO TABS Oral Take 1 tablet by mouth daily.      [name] PANTOPRAZOLE SODIUM 40 MG PO TBEC  TAKE ONE TABLET BY MOUTH EVERY DAY 90 tablet 3  . PROAIR HFA 108 (90 BASE) MCG/ACT IN AERS  INHALE 2 PUFFS BY MOUTH EVERY 4-6 HOURS AS NEEDED 8.5 g 10  .  DOXYCYCLINE HYCLATE 100 MG PO CAPS Oral Take 1 capsule (100 mg total) by mouth 2 (two) times daily. 20 capsule 0  . GUAIFENESIN ER 600 MG PO TB12 Oral Take 1,200 mg by mouth 2 (two) times daily.    [name] MUPIROCIN CALCIUM 2 % EX CREA Topical Apply topically 3 (three) times daily. 15 g 0  . POTASSIUM 99 MG PO TABS Oral Take 1 tablet by mouth daily.       BP 119/82  Pulse 83  Temp 98 F (36.7 C) (Oral)  Resp 16  SpO2 99%  Physical Exam  Nursing note and vitals reviewed. Constitutional: She is oriented to person, place, and time. She appears well-developed and well-nourished. No distress.  HENT:  Head: Normocephalic and atraumatic.  Eyes: Conjunctivae are normal.  Neck: Neck supple.  Cardiovascular: Normal heart sounds.   Pulmonary/Chest: Breath sounds normal.  Abdominal: Soft. There is no tenderness.  Lymphadenopathy:    She has no cervical adenopathy.  Neurological: She is alert and oriented to person, place, and time.  Skin:       Left upper inner thigh  boarding with perineal area. There are a few isolated pustules and one ulcerated area of about 5 mm with a yellow center. No active drainage. No significant skin erythema or swelling. No fluctuations or indurations.    ED Course  Procedures (including critical care time)  Labs Reviewed - No data to display No results found.   1. Folliculitis of perineum       MDM  Impress mild superficial skin infection in the left groin. It would likely resolve with topical mupirocin and daily cleaning. Provided a hold prescription for doxycycline in case worsening symptoms while patient is away on vacation.        Randa Spike, MD 10/26/11 [id number]

## 2011-10-26 NOTE — ED Notes (Signed)
Pt states she was sitting outside yesterday and got a couple of insect bites (which she felt) to the lt upper leg/groin area.  States they are itchy.  Today the areas are redder and one area is filled with pus.

## 2011-11-10 ENCOUNTER — Other Ambulatory Visit: Payer: Self-pay | Admitting: Neurology

## 2011-11-10 ENCOUNTER — Ambulatory Visit
Admission: RE | Admit: 2011-11-10 | Discharge: 2011-11-10 | Disposition: A | Discharge status: Home / Self Care | Payer: 59 | Source: Ambulatory Visit | Attending: Neurology | Admitting: Neurology

## 2011-11-10 DIAGNOSIS — R609 Edema, unspecified: Secondary | ICD-10-CM

## 2011-11-10 DIAGNOSIS — M79609 Pain in unspecified limb: Secondary | ICD-10-CM

## 2011-12-30 ENCOUNTER — Emergency Department (HOSPITAL_COMMUNITY)
Admission: EM | Admit: 2011-12-30 | Discharge: 2011-12-30 | Disposition: A | Discharge status: Home / Self Care | Payer: 59 | Source: Home / Self Care | Attending: Emergency Medicine | Admitting: Emergency Medicine

## 2011-12-30 ENCOUNTER — Encounter (HOSPITAL_COMMUNITY): Payer: Self-pay | Admitting: Emergency Medicine

## 2011-12-30 DIAGNOSIS — L0291 Cutaneous abscess, unspecified: Secondary | ICD-10-CM

## 2011-12-30 DIAGNOSIS — L0390 Cellulitis, unspecified: Secondary | ICD-10-CM

## 2011-12-30 MED ORDER — MUPIROCIN 2 % EX OINT: TOPICAL_OINTMENT | Freq: Three times a day (TID) | CUTANEOUS | Status: DC

## 2011-12-30 MED ORDER — CEPHALEXIN 500 MG PO CAPS: 500.0000 mg | ORAL_CAPSULE | Freq: Four times a day (QID) | ORAL | Status: AC

## 2011-12-30 NOTE — ED Notes (Signed)
Pt c/o skin infection/?insect bite right lower leg, itching,warm to touch, and weeping yellow fluid. Noticed on 12/14/11. Tried neosporin/epson salt soak with no relief.

## 2011-12-30 NOTE — ED Provider Notes (Signed)
History     CSN: [id number]  Arrival date & time 12/30/11  1306   First MD Initiated Contact with Patient 12/30/11 1319      Chief Complaint  Patient presents with  . Cellulitis    ? insect bite    (Consider location/radiation/quality/duration/timing/severity/associated sxs/prior treatment) HPI Comments: Patient reports having a "bug bite" on the lateral left right lower leg about a week ago, which is becoming increasingly red and painful. Noted blisters, yellowish crusting starting several days ago. Has been applying rubbing alcohol, bacitracin without improvement. No aggravating symptoms. No nausea, vomiting, fevers, erythema streaking up the leg.. She is not a diabetic.  ROS as noted in HPI. All other ROS negative.   Patient is a 56 y.o. female presenting with abscess. The history is provided by the patient.  Abscess  This is a new problem. The current episode started more than one week ago. The onset was gradual. The problem has been gradually worsening. The abscess is present on the right lower leg. The abscess is characterized by itchiness, blistering, redness, painfulness and draining. It is unknown what she was exposed to. The abscess first occurred at home. Pertinent negatives include no fever. There were no sick contacts.    Past Medical History  Diagnosis Date  . Allergy   . Anxiety   . Hx of adenomatous colonic polyps   . Thyroid disease     Hypothyroidism  . Lumbar disc disease   . Diverticulosis of colon   . Nephrolithiasis   . Hypotonic bladder     Hospitalized 06/2009 for UTI  . Chronic LBP 03/29/2011  . Obesity 03/29/2011  . History of renal stone 04/15/2011    Past Surgical History  Procedure Date  . Abdominal hysterectomy   . Cholecystectomy   . Ovarian cyst surgery   . Breast surgery 11/2007    Biopsy  . Cystoscopy 2008    Family History  Problem Relation Age of Onset  . Dementia Mother   . Colon cancer Mother 63  . Breast cancer Mother   .  Dementia Father   . Colon cancer Father 72  . Heart disease Neg Hx   . Stomach cancer Neg Hx   . Hypertension Other   . Cancer Other     History  Substance Use Topics  . Smoking status: Former Smoker    Quit date: 09/13/1979  . Smokeless tobacco: Never Used  . Alcohol Use: No    OB History    Grav Para Term Preterm Abortions TAB SAB Ect Mult Living                  Review of Systems  Constitutional: Negative for fever.    Allergies  Amoxicillin; Aspirin; Latex; Moxifloxacin; and Sulfonamide derivatives  Home Medications   Current Outpatient Rx  Name Route Sig Dispense Refill  . ACETAMINOPHEN 500 MG PO TABS Oral Take 500 mg by mouth every 6 (six) hours as needed.    . ALPRAZOLAM 0.25 MG PO TABS Oral Take 1 tablet (0.25 mg total) by mouth 3 (three) times daily as needed for anxiety. 60 tablet 3  . FUROSEMIDE 40 MG PO TABS Oral Take 1 tablet (40 mg total) by mouth daily. 30 tablet 11  . LEVOTHYROXINE SODIUM 88 MCG PO TABS  TAKE ONE TABLET BY MOUTH EVERY DAY 90 tablet 3  . ONE-DAILY MULTI VITAMINS PO TABS Oral Take 1 tablet by mouth daily.      [name] PROAIR HFA 108 (90  BASE) MCG/ACT IN AERS  INHALE 2 PUFFS BY MOUTH EVERY 4-6 HOURS AS NEEDED 8.5 g 10  . CALCIUM CARBONATE 600 MG PO TABS Oral Take 600 mg by mouth daily.      . CEPHALEXIN 500 MG PO CAPS Oral Take 1 capsule (500 mg total) by mouth 4 (four) times daily. X 10 days 40 capsule 0  . GUAIFENESIN ER 600 MG PO TB12 Oral Take 1,200 mg by mouth 2 (two) times daily.    [name] LORATADINE 10 MG PO TABS Oral Take 10 mg by mouth daily.     [name] MUPIROCIN 2 % EX OINT Topical Apply topically 3 (three) times daily. 22 g 0  . PANTOPRAZOLE SODIUM 40 MG PO TBEC  TAKE ONE TABLET BY MOUTH EVERY DAY 90 tablet 3  . POTASSIUM 99 MG PO TABS Oral Take 1 tablet by mouth daily.       BP 100/68  Pulse 80  Temp 98.3 F (36.8 C) (Oral)  Resp 18  SpO2 99%  Physical Exam  Nursing note and vitals reviewed. Constitutional: She is oriented to person,  place, and time. She appears well-developed and well-nourished. No distress.  HENT:  Head: Normocephalic and atraumatic.  Eyes: Conjunctivae and EOM are normal.  Neck: Normal range of motion.  Cardiovascular: Normal rate.   Pulmonary/Chest: Effort normal.  Abdominal: She exhibits no distension.  Musculoskeletal: Normal range of motion.  Neurological: She is alert and oriented to person, place, and time. Coordination normal.  Skin: Skin is warm and dry. Rash noted.       4 x 4 centimeter area of tender erythema, group blisters, yellowish crusting. No induration, central fluctuance.  Psychiatric: She has a normal mood and affect. Her behavior is normal. Judgment and thought content normal.    ED Course  Procedures (including critical care time)  Labs Reviewed - No data to display No results found.   1. Cellulitis     MDM  Seen on 7/4 with perineal folliculitis, which was treated with topical Bactroban for daily cleaning. Was sent home with a wait and see prescription of doxycycline.   Today's presentation is consistent with superficial staph infection vs impetigo, less have her restart the Bactroban. No abscess. Starting Keflex.  Patient states she is able to tolerate Keflex. Marked area with permanent marker for reference. Discussed signs and symptoms that should prompt return. She'll follow with her primary care physician as needed. Patient agrees with plan.  Cherly Beach, MD 12/30/11 1444

## 2012-01-29 ENCOUNTER — Encounter: Payer: Self-pay | Admitting: Internal Medicine

## 2012-01-29 ENCOUNTER — Telehealth: Payer: Self-pay

## 2012-01-29 DIAGNOSIS — Z0000 Encounter for general adult medical examination without abnormal findings: Secondary | ICD-10-CM

## 2012-01-29 NOTE — Telephone Encounter (Signed)
Put lab order in.

## 2012-02-12 ENCOUNTER — Telehealth: Payer: Self-pay | Admitting: Internal Medicine

## 2012-02-12 MED ORDER — FUROSEMIDE 40 MG PO TABS: 40.0000 mg | ORAL_TABLET | Freq: Every day | ORAL | Status: DC

## 2012-02-12 NOTE — Telephone Encounter (Signed)
Caller: Analis/Patient; Patient Name: [name]; PCP: Cathlean Cower (Adults only); Best Callback Phone Number: [phone] Needing refill on Lasix 40 mgs 1 PO QD called in [facility]. She is completely out of medication and is switching pharmacies.  Please call her on her cell phone to let her know if med called in [phone]. Medication Questions Protocol.

## 2012-02-12 NOTE — Telephone Encounter (Signed)
Called the patient informed sent in rx per pt. Request to [facility]. pharmacy

## 2012-02-18 ENCOUNTER — Encounter (HOSPITAL_COMMUNITY): Payer: Self-pay

## 2012-02-18 ENCOUNTER — Emergency Department (HOSPITAL_COMMUNITY)
Admission: EM | Admit: 2012-02-18 | Discharge: 2012-02-18 | Disposition: A | Discharge status: Home / Self Care | Payer: 59 | Source: Home / Self Care | Attending: Family Medicine | Admitting: Family Medicine

## 2012-02-18 DIAGNOSIS — J329 Chronic sinusitis, unspecified: Secondary | ICD-10-CM

## 2012-02-18 MED ORDER — AZITHROMYCIN 250 MG PO TABS: 250.0000 mg | ORAL_TABLET | Freq: Every day | ORAL | Status: DC

## 2012-02-18 MED ORDER — FLUTICASONE PROPIONATE 50 MCG/ACT NA SUSP: 2.0000 | Freq: Every day | NASAL | Status: DC

## 2012-02-18 MED ORDER — FEXOFENADINE-PSEUDOEPHED ER 60-120 MG PO TB12: 1.0000 | ORAL_TABLET | Freq: Two times a day (BID) | ORAL | Status: DC

## 2012-02-18 MED ORDER — PREDNISONE 20 MG PO TABS: ORAL_TABLET | ORAL | Status: DC

## 2012-02-18 NOTE — ED Notes (Signed)
C/o sinus issues, ear pain, dizziness for 1 week, worse past couple of days

## 2012-02-19 NOTE — ED Provider Notes (Signed)
History     CSN: [id number]  Arrival date & time 02/18/12  1230   First MD Initiated Contact with Patient 02/18/12 1249      Chief Complaint  Patient presents with  . Facial Pain    (Consider location/radiation/quality/duration/timing/severity/associated sxs/prior treatment) HPI Comments: 56 year old female with history of chronic rhinitis andrecurrent sinusitis here complaining of nasal congestion, sneezing, bilateral ear pressure and rhinorrhea for over a week. Symptoms now associated with sinus is and thickening of nasal discharge in the last 2 days. Denies fever or chills. Reports intermittent headache. No nausea or vomiting.denies productive cough or chest pain.   Past Medical History  Diagnosis Date  . Allergy   . Anxiety   . Hx of adenomatous colonic polyps   . Thyroid disease     Hypothyroidism  . Lumbar disc disease   . Diverticulosis of colon   . Nephrolithiasis   . Hypotonic bladder     Hospitalized 06/2009 for UTI  . Chronic LBP 03/29/2011  . Obesity 03/29/2011  . History of renal stone 04/15/2011    Past Surgical History  Procedure Date  . Abdominal hysterectomy   . Cholecystectomy   . Ovarian cyst surgery   . Breast surgery 11/2007    Biopsy  . Cystoscopy 2008    Family History  Problem Relation Age of Onset  . Dementia Mother   . Colon cancer Mother 40  . Breast cancer Mother   . Dementia Father   . Colon cancer Father 38  . Heart disease Neg Hx   . Stomach cancer Neg Hx   . Hypertension Other   . Cancer Other     History  Substance Use Topics  . Smoking status: Former Smoker    Quit date: 09/13/1979  . Smokeless tobacco: Never Used  . Alcohol Use: No    OB History    Grav Para Term Preterm Abortions TAB SAB Ect Mult Living                  Review of Systems  Constitutional: Negative for fever, chills and fatigue.  HENT: Positive for ear pain, congestion, rhinorrhea, sneezing and sinus pressure. Negative for sore throat, trouble  swallowing and neck pain.   Respiratory: Negative for cough, shortness of breath and wheezing.   Cardiovascular: Negative for chest pain.  Neurological: Positive for headaches. Negative for dizziness.    Allergies  Amoxicillin; Aspirin; Latex; Moxifloxacin; and Sulfonamide derivatives  Home Medications   Current Outpatient Rx  Name Route Sig Dispense Refill  . ACETAMINOPHEN 500 MG PO TABS Oral Take 500 mg by mouth every 6 (six) hours as needed.    [name] CALCIUM CARBONATE 600 MG PO TABS Oral Take 600 mg by mouth daily.      . FUROSEMIDE 40 MG PO TABS Oral Take 1 tablet (40 mg total) by mouth daily. 30 tablet 11  . GUAIFENESIN ER 600 MG PO TB12 Oral Take 1,200 mg by mouth 2 (two) times daily.    [name] LEVOTHYROXINE SODIUM 88 MCG PO TABS  TAKE ONE TABLET BY MOUTH EVERY DAY 90 tablet 3  . PANTOPRAZOLE SODIUM 40 MG PO TBEC  TAKE ONE TABLET BY MOUTH EVERY DAY 90 tablet 3  . POTASSIUM 99 MG PO TABS Oral Take 1 tablet by mouth daily.     . AZITHROMYCIN 250 MG PO TABS Oral Take 1 tablet (250 mg total) by mouth daily. 6 tablet 0  . FEXOFENADINE-PSEUDOEPHED ER 60-120 MG PO TB12 Oral Take 1 tablet  by mouth every 12 (twelve) hours. 20 tablet 0  . FLUTICASONE PROPIONATE 50 MCG/ACT NA SUSP Nasal Place 2 sprays into the nose daily. 16 g 1  . ONE-DAILY MULTI VITAMINS PO TABS Oral Take 1 tablet by mouth daily.      [name] MUPIROCIN 2 % EX OINT Topical Apply topically 3 (three) times daily. 22 g 0  . PREDNISONE 20 MG PO TABS  2 tabs by mouth daily for 5 days 10 tablet 0  . PROAIR HFA 108 (90 BASE) MCG/ACT IN AERS  INHALE 2 PUFFS BY MOUTH EVERY 4-6 HOURS AS NEEDED 8.5 g 10    BP 122/74  Pulse 79  Temp 98.4 F (36.9 C) (Oral)  Resp 18  SpO2 100%  Physical Exam  Nursing note and vitals reviewed. Constitutional: She is oriented to person, place, and time. She appears well-developed and well-nourished. No distress.  HENT:  Head: Normocephalic and atraumatic.  Right Ear: External ear normal.  Left Ear: External  ear normal.       Nasal Congestion with erythema and swelling of nasal turbinates, white/yellow rhinorrhea. pharyngeal erythema no exudates. No uvula deviation. No trismus. TM's impress clear fluid behind left TM otherwise normal.   Eyes: Conjunctivae normal and EOM are normal. Pupils are equal, round, and reactive to light. Right eye exhibits no discharge. Left eye exhibits no discharge.  Neck: Neck supple. No JVD present.  Cardiovascular: Normal rate, regular rhythm and normal heart sounds.   Pulmonary/Chest: Effort normal and breath sounds normal. She has no wheezes.  Lymphadenopathy:    She has no cervical adenopathy.  Neurological: She is alert and oriented to person, place, and time.  Skin: No rash noted.    ED Course  Procedures (including critical care time)  Labs Reviewed - No data to display No results found.   1. Sinusitis       MDM  Treated with Prednisone. Azithromycin, allegra-D and Flonase. Red flags that should prompt her return to medical attention discussed with patient and provided in writing.        Randa Spike, MD 02/20/12 [phone]

## 2012-03-22 ENCOUNTER — Other Ambulatory Visit (INDEPENDENT_AMBULATORY_CARE_PROVIDER_SITE_OTHER): Payer: 59

## 2012-03-22 ENCOUNTER — Telehealth: Payer: Self-pay | Admitting: Internal Medicine

## 2012-03-22 ENCOUNTER — Other Ambulatory Visit: Payer: Self-pay | Admitting: Internal Medicine

## 2012-03-22 DIAGNOSIS — Z0000 Encounter for general adult medical examination without abnormal findings: Secondary | ICD-10-CM

## 2012-03-22 LAB — CBC WITH DIFFERENTIAL/PLATELET
Basophils Absolute: 0.1 10*3/uL (ref 0.0–0.1)
Basophils Relative: 0.9 % (ref 0.0–3.0)
Eosinophils Absolute: 0.3 10*3/uL (ref 0.0–0.7)
Eosinophils Relative: 5.6 % — ABNORMAL HIGH (ref 0.0–5.0)
HCT: 40.3 % (ref 36.0–46.0)
Hemoglobin: 13.4 g/dL (ref 12.0–15.0)
Lymphocytes Relative: 31.2 % (ref 12.0–46.0)
Lymphs Abs: 1.9 10*3/uL (ref 0.7–4.0)
MCHC: 33.3 g/dL (ref 30.0–36.0)
MCV: 95.4 fl (ref 78.0–100.0)
Monocytes Absolute: 0.4 10*3/uL (ref 0.1–1.0)
Monocytes Relative: 6.7 % (ref 3.0–12.0)
Neutro Abs: 3.3 10*3/uL (ref 1.4–7.7)
Neutrophils Relative %: 55.6 % (ref 43.0–77.0)
Platelets: 246 10*3/uL (ref 150.0–400.0)
RBC: 4.23 Mil/uL (ref 3.87–5.11)
RDW: 13.4 % (ref 11.5–14.6)
WBC: 6 10*3/uL (ref 4.5–10.5)

## 2012-03-22 LAB — URINALYSIS, ROUTINE W REFLEX MICROSCOPIC
Bilirubin Urine: NEGATIVE
Hgb urine dipstick: NEGATIVE
Ketones, ur: NEGATIVE
Leukocytes, UA: NEGATIVE
Nitrite: NEGATIVE
Specific Gravity, Urine: 1.01 (ref 1.000–1.030)
Total Protein, Urine: NEGATIVE
Urine Glucose: NEGATIVE
Urobilinogen, UA: 0.2 (ref 0.0–1.0)
pH: 7 (ref 5.0–8.0)

## 2012-03-22 LAB — BASIC METABOLIC PANEL
BUN: 12 mg/dL (ref 6–23)
CO2: 24 mEq/L (ref 19–32)
Calcium: 8.7 mg/dL (ref 8.4–10.5)
Chloride: 103 mEq/L (ref 96–112)
Creatinine, Ser: 0.8 mg/dL (ref 0.4–1.2)
GFR: 76.02 mL/min (ref >60.00)
Glucose, Bld: 87 mg/dL (ref 70–99)
Potassium: 3.3 mEq/L — ABNORMAL LOW (ref 3.5–5.1)
Sodium: 136 mEq/L (ref 135–145)

## 2012-03-22 LAB — HEPATIC FUNCTION PANEL
ALT: 28 U/L (ref 0–35)
AST: 28 U/L (ref 0–37)
Albumin: 4 g/dL (ref 3.5–5.2)
Alkaline Phosphatase: 74 U/L (ref 39–117)
Bilirubin, Direct: 0.1 mg/dL (ref 0.0–0.3)
Total Bilirubin: 0.6 mg/dL (ref 0.3–1.2)
Total Protein: 6.8 g/dL (ref 6.0–8.3)

## 2012-03-22 LAB — LIPID PANEL
Cholesterol: 144 mg/dL (ref 0–200)
HDL: 50.6 mg/dL (ref >39.00)
LDL Cholesterol: 77 mg/dL (ref 0–99)
Total CHOL/HDL Ratio: 3
Triglycerides: 82 mg/dL (ref 0.0–149.0)
VLDL: 16.4 mg/dL (ref 0.0–40.0)

## 2012-03-22 LAB — TSH: TSH: 1.78 u[IU]/mL (ref 0.35–5.50)

## 2012-03-22 MED ORDER — ALPRAZOLAM 0.25 MG PO TABS: 0.2500 mg | ORAL_TABLET | Freq: Three times a day (TID) | ORAL | Status: DC | PRN

## 2012-03-22 NOTE — Telephone Encounter (Signed)
done

## 2012-03-22 NOTE — Telephone Encounter (Signed)
Pt advised, Rx faxed to pharmacy

## 2012-03-22 NOTE — Telephone Encounter (Signed)
Needs refill on alprazolam 0.25mg , please send to [facility] on [address]

## 2012-03-29 ENCOUNTER — Encounter: Payer: Self-pay | Admitting: Internal Medicine

## 2012-03-29 ENCOUNTER — Ambulatory Visit (INDEPENDENT_AMBULATORY_CARE_PROVIDER_SITE_OTHER): Payer: 59 | Admitting: Internal Medicine

## 2012-03-29 VITALS — BP 108/70 | HR 71 | Temp 98.3°F | Ht 63.0 in | Wt 188.4 lb

## 2012-03-29 DIAGNOSIS — R7309 Other abnormal glucose: Secondary | ICD-10-CM

## 2012-03-29 DIAGNOSIS — R7302 Impaired glucose tolerance (oral): Secondary | ICD-10-CM

## 2012-03-29 DIAGNOSIS — Z0000 Encounter for general adult medical examination without abnormal findings: Secondary | ICD-10-CM

## 2012-03-29 DIAGNOSIS — E876 Hypokalemia: Secondary | ICD-10-CM

## 2012-03-29 MED ORDER — ALPRAZOLAM 0.25 MG PO TABS: 0.2500 mg | ORAL_TABLET | Freq: Three times a day (TID) | ORAL | Status: DC | PRN

## 2012-03-29 MED ORDER — POTASSIUM CHLORIDE CRYS ER 10 MEQ PO TBCR: 10.0000 meq | EXTENDED_RELEASE_TABLET | Freq: Every day | ORAL | Status: DC

## 2012-03-29 NOTE — Assessment & Plan Note (Signed)
To start rx klor con 10 qd

## 2012-03-29 NOTE — Assessment & Plan Note (Signed)

## 2012-03-29 NOTE — Progress Notes (Signed)
Subjective:    Patient ID: [name], female    DOB: 05/10/1955, 56 y.o.   MRN: [id number]  HPI  Here for wellness and f/u;  Overall doing ok;  Pt denies CP, worsening SOB, DOE, wheezing, orthopnea, PND, worsening LE edema, palpitations, dizziness or syncope.  Pt denies neurological change such as new Headache, facial or extremity weakness.  Pt denies polydipsia, polyuria, or low sugar symptoms. Pt states overall good compliance with treatment and medications, good tolerability, and trying to follow lower cholesterol diet.  Pt denies worsening depressive symptoms, suicidal ideation or panic. No fever, wt loss, night sweats, loss of appetite, or other constitutional symptoms.  Pt states good ability with ADL's, low fall risk, home safety reviewed and adequate, no significant changes in hearing or vision, and occasionally active with exercise.  No acute complaints. Has been able to lose a few lbs recently with better activity level. Past Medical History  Diagnosis Date  . Allergy   . Anxiety   . Hx of adenomatous colonic polyps   . Thyroid disease     Hypothyroidism  . Lumbar disc disease   . Diverticulosis of colon   . Nephrolithiasis   . Hypotonic bladder     Hospitalized 06/2009 for UTI  . Chronic LBP 03/29/2011  . Obesity 03/29/2011  . History of renal stone 04/15/2011   Past Surgical History  Procedure Date  . Abdominal hysterectomy   . Cholecystectomy   . Ovarian cyst surgery   . Breast surgery 11/2007    Biopsy  . Cystoscopy 2008    reports that she quit smoking about 32 years ago. She has never used smokeless tobacco. She reports that she does not drink alcohol or use illicit drugs. family history includes Breast cancer in her mother; Cancer in her other; Colon cancer (age of onset:67) in her mother; Colon cancer (age of onset:68) in her father; Dementia in her father and mother; and Hypertension in her other.  There is no history of Heart disease and Stomach  cancer. Allergies  Allergen Reactions  . Amoxicillin     REACTION: rash, sob - tol kelfex and rocephn hosp 06/2009  . Aspirin     REACTION: rash, sob  . Latex     Blisters on skin  . Moxifloxacin     REACTION: hives - but tol cipro w/o adv rxn  . Sulfonamide Derivatives     REACTION: ?rash, sob - but reports bactrim tolerence   Current Outpatient Prescriptions on File Prior to Visit  Medication Sig Dispense Refill  . acetaminophen (TYLENOL) 500 MG tablet Take 500 mg by mouth every 6 (six) hours as needed.      [name] azithromycin (ZITHROMAX) 250 MG tablet Take 1 tablet (250 mg total) by mouth daily.  6 tablet  0  . calcium carbonate (OS-CAL) 600 MG TABS Take 600 mg by mouth daily.        . fexofenadine-pseudoephedrine (ALLEGRA-D) 60-120 MG per tablet Take 1 tablet by mouth every 12 (twelve) hours.  20 tablet  0  . fluticasone (FLONASE) 50 MCG/ACT nasal spray Place 2 sprays into the nose daily.  16 g  1  . furosemide (LASIX) 40 MG tablet Take 1 tablet (40 mg total) by mouth daily.  30 tablet  11  . guaiFENesin (MUCINEX) 600 MG 12 hr tablet Take 1,200 mg by mouth 2 (two) times daily.      [name] levothyroxine (SYNTHROID, LEVOTHROID) 88 MCG tablet TAKE ONE TABLET BY MOUTH EVERY DAY  90 tablet  3  . Multiple Vitamin (MULTIVITAMIN) tablet Take 1 tablet by mouth daily.        . mupirocin ointment (BACTROBAN) 2 % Apply topically 3 (three) times daily.  22 g  0  . pantoprazole (PROTONIX) 40 MG tablet TAKE ONE TABLET BY MOUTH EVERY DAY  90 tablet  3  . Potassium 99 MG TABS Take 1 tablet by mouth daily.       . predniSONE (DELTASONE) 20 MG tablet 2 tabs by mouth daily for 5 days  10 tablet  0  . PROAIR HFA 108 (90 BASE) MCG/ACT inhaler INHALE 2 PUFFS BY MOUTH EVERY 4-6 HOURS AS NEEDED  8.5 g  10  . potassium chloride (KLOR-CON M10) 10 MEQ tablet Take 1 tablet (10 mEq total) by mouth daily.  90 tablet  3  . [DISCONTINUED] loratadine (CLARITIN) 10 MG tablet Take 10 mg by mouth daily.        Review of  Systems Review of Systems  Constitutional: Negative for diaphoresis, activity change, appetite change and unexpected weight change.  HENT: Negative for hearing loss, ear pain, facial swelling, mouth sores and neck stiffness.   Eyes: Negative for pain, redness and visual disturbance.  Respiratory: Negative for shortness of breath and wheezing.   Cardiovascular: Negative for chest pain and palpitations.  Gastrointestinal: Negative for diarrhea, blood in stool, abdominal distention and rectal pain.  Genitourinary: Negative for hematuria, flank pain and decreased urine volume.  Musculoskeletal: Negative for myalgias and joint swelling.  Skin: Negative for color change and wound.  Neurological: Negative for syncope and numbness.  Hematological: Negative for adenopathy.  Psychiatric/Behavioral: Negative for hallucinations, self-injury, decreased concentration and agitation.      Objective:   Physical Exam BP 108/70  Pulse 71  Temp 98.3 F (36.8 C) (Oral)  Ht 5\' 3"  (1.6 m)  Wt 188 lb 6 oz (85.446 kg)  BMI 33.37 kg/m2  SpO2 97% Physical Exam  VS noted Constitutional: Pt is oriented to person, place, and time. Appears well-developed and well-nourished.  HENT:  Head: Normocephalic and atraumatic.  Right Ear: External ear normal.  Left Ear: External ear normal.  Nose: Nose normal.  Mouth/Throat: Oropharynx is clear and moist.  Eyes: Conjunctivae and EOM are normal. Pupils are equal, round, and reactive to light.  Neck: Normal range of motion. Neck supple. No JVD present. No tracheal deviation present.  Cardiovascular: Normal rate, regular rhythm, normal heart sounds and intact distal pulses.   Pulmonary/Chest: Effort normal and breath sounds normal.  Abdominal: Soft. Bowel sounds are normal. There is no tenderness.  Musculoskeletal: Normal range of motion. Exhibits no edema.  Lymphadenopathy:  Has no cervical adenopathy.  Neurological: Pt is alert and oriented to person, place, and  time. Pt has normal reflexes. No cranial nerve deficit.  Skin: Skin is warm and dry. No rash noted.  Psychiatric:  Has  normal mood and affect. Behavior is normal.     Assessment & Plan:

## 2012-03-29 NOTE — Patient Instructions (Addendum)
Take all new medications as prescribed - the potassium (sent to [facility]) Continue all other medications as before, including the alprazolam Please have the pharmacy call with any other refills you may need. Please continue your efforts at being more active, low cholesterol diet, and weight control. You are otherwise up to date with prevention measures Please check your Email for your username for the MyChart You can use MyChart to message in the future if you need refills or have questions Please return in 1 year for your yearly visit, or sooner if needed, with Lab testing done 3-5 days before

## 2012-03-29 NOTE — Assessment & Plan Note (Signed)
Asymtp, stable, for a1c next visit, cont wt loss /diet effort

## 2012-04-03 ENCOUNTER — Other Ambulatory Visit: Payer: Self-pay | Admitting: Obstetrics and Gynecology

## 2012-04-03 DIAGNOSIS — N6459 Other signs and symptoms in breast: Secondary | ICD-10-CM

## 2012-04-03 DIAGNOSIS — N6452 Nipple discharge: Secondary | ICD-10-CM

## 2012-04-04 ENCOUNTER — Ambulatory Visit
Admission: RE | Admit: 2012-04-04 | Discharge: 2012-04-04 | Disposition: A | Discharge status: Home / Self Care | Payer: 59 | Source: Ambulatory Visit | Attending: Obstetrics and Gynecology | Admitting: Obstetrics and Gynecology

## 2012-04-04 ENCOUNTER — Other Ambulatory Visit: Payer: Self-pay | Admitting: Obstetrics and Gynecology

## 2012-04-04 DIAGNOSIS — N6452 Nipple discharge: Secondary | ICD-10-CM

## 2012-04-04 DIAGNOSIS — N6459 Other signs and symptoms in breast: Secondary | ICD-10-CM

## 2012-04-09 ENCOUNTER — Other Ambulatory Visit: Payer: Self-pay | Admitting: Obstetrics and Gynecology

## 2012-04-09 DIAGNOSIS — N6489 Other specified disorders of breast: Secondary | ICD-10-CM

## 2012-04-12 ENCOUNTER — Other Ambulatory Visit (HOSPITAL_COMMUNITY): Payer: Self-pay | Admitting: Obstetrics and Gynecology

## 2012-04-12 DIAGNOSIS — N6489 Other specified disorders of breast: Secondary | ICD-10-CM

## 2012-04-18 ENCOUNTER — Ambulatory Visit (HOSPITAL_COMMUNITY)
Admission: RE | Admit: 2012-04-18 | Discharge: 2012-04-18 | Disposition: A | Discharge status: Home / Self Care | Payer: 59 | Source: Ambulatory Visit | Attending: Obstetrics and Gynecology | Admitting: Obstetrics and Gynecology

## 2012-04-18 ENCOUNTER — Ambulatory Visit
Admission: RE | Admit: 2012-04-18 | Discharge: 2012-04-18 | Disposition: A | Discharge status: Home / Self Care | Payer: 59 | Source: Ambulatory Visit | Attending: Obstetrics and Gynecology | Admitting: Obstetrics and Gynecology

## 2012-04-18 DIAGNOSIS — Z803 Family history of malignant neoplasm of breast: Secondary | ICD-10-CM | POA: Insufficient documentation

## 2012-04-18 DIAGNOSIS — N6489 Other specified disorders of breast: Secondary | ICD-10-CM

## 2012-04-18 DIAGNOSIS — N6009 Solitary cyst of unspecified breast: Secondary | ICD-10-CM | POA: Insufficient documentation

## 2012-04-18 MED ORDER — GADOBENATE DIMEGLUMINE 529 MG/ML IV SOLN
17.0000 mL | Freq: Once | INTRAVENOUS | Status: AC | PRN
  Administered 2012-04-18: 17 mL via INTRAVENOUS

## 2012-05-01 ENCOUNTER — Encounter: Payer: Self-pay | Admitting: Internal Medicine

## 2012-05-01 ENCOUNTER — Other Ambulatory Visit: Payer: Self-pay

## 2012-05-01 MED ORDER — LEVOTHYROXINE SODIUM 88 MCG PO TABS: 88.0000 ug | ORAL_TABLET | Freq: Every day | ORAL | Status: DC

## 2012-05-02 ENCOUNTER — Encounter: Payer: Self-pay | Admitting: Internal Medicine

## 2012-05-02 ENCOUNTER — Other Ambulatory Visit (HOSPITAL_COMMUNITY): Payer: Self-pay | Admitting: Orthopedic Surgery

## 2012-05-02 ENCOUNTER — Ambulatory Visit (HOSPITAL_COMMUNITY)
Admission: RE | Admit: 2012-05-02 | Discharge: 2012-05-02 | Disposition: A | Discharge status: Home / Self Care | Payer: 59 | Source: Ambulatory Visit | Attending: Orthopedic Surgery | Admitting: Orthopedic Surgery

## 2012-05-02 ENCOUNTER — Telehealth: Payer: Self-pay | Admitting: Internal Medicine

## 2012-05-02 DIAGNOSIS — M25571 Pain in right ankle and joints of right foot: Secondary | ICD-10-CM

## 2012-05-02 DIAGNOSIS — X58XXXA Exposure to other specified factors, initial encounter: Secondary | ICD-10-CM | POA: Insufficient documentation

## 2012-05-02 DIAGNOSIS — M79604 Pain in right leg: Secondary | ICD-10-CM

## 2012-05-02 DIAGNOSIS — M25579 Pain in unspecified ankle and joints of unspecified foot: Secondary | ICD-10-CM | POA: Insufficient documentation

## 2012-05-02 DIAGNOSIS — S93499A Sprain of other ligament of unspecified ankle, initial encounter: Secondary | ICD-10-CM | POA: Insufficient documentation

## 2012-05-02 DIAGNOSIS — M79609 Pain in unspecified limb: Secondary | ICD-10-CM | POA: Insufficient documentation

## 2012-05-02 DIAGNOSIS — IMO0002 Reserved for concepts with insufficient information to code with codable children: Secondary | ICD-10-CM | POA: Insufficient documentation

## 2012-05-02 MED ORDER — FEXOFENADINE-PSEUDOEPHED ER 60-120 MG PO TB12: 1.0000 | ORAL_TABLET | Freq: Two times a day (BID) | ORAL | Status: DC

## 2012-05-02 NOTE — Telephone Encounter (Signed)
Pt mychart message:    ----- Message ----- From: [name] Sent: 05/02/2012 2:14 PM To: [facility] Clinical [facility] Subject: Non-Urgent Medical Question could I please get alegra d new prescription for [name] outpatient pharmacy? You had mentioned using that instead of loratidine I am congested again  OK - done erx

## 2012-05-06 ENCOUNTER — Ambulatory Visit (HOSPITAL_COMMUNITY)
Admission: RE | Admit: 2012-05-06 | Discharge: 2012-05-06 | Disposition: A | Discharge status: Home / Self Care | Payer: 59 | Source: Ambulatory Visit | Attending: Orthopedic Surgery | Admitting: Orthopedic Surgery

## 2012-05-06 ENCOUNTER — Other Ambulatory Visit (HOSPITAL_COMMUNITY): Payer: Self-pay | Admitting: Orthopedic Surgery

## 2012-05-06 DIAGNOSIS — I82409 Acute embolism and thrombosis of unspecified deep veins of unspecified lower extremity: Secondary | ICD-10-CM | POA: Insufficient documentation

## 2012-05-06 NOTE — Progress Notes (Signed)
RLE Venous Duplex Completed. Alla German

## 2012-05-08 ENCOUNTER — Ambulatory Visit: Payer: 59 | Admitting: Rehabilitative and Restorative Service Providers"

## 2012-06-03 ENCOUNTER — Encounter (HOSPITAL_COMMUNITY): Payer: Self-pay | Admitting: Emergency Medicine

## 2012-06-03 ENCOUNTER — Emergency Department (HOSPITAL_COMMUNITY)
Admission: EM | Admit: 2012-06-03 | Discharge: 2012-06-03 | Disposition: A | Discharge status: Home / Self Care | Payer: 59 | Source: Home / Self Care | Attending: Family Medicine | Admitting: Family Medicine

## 2012-06-03 DIAGNOSIS — N390 Urinary tract infection, site not specified: Secondary | ICD-10-CM

## 2012-06-03 LAB — POCT URINALYSIS DIP (DEVICE)
Bilirubin Urine: NEGATIVE
Glucose, UA: NEGATIVE mg/dL
Ketones, ur: NEGATIVE mg/dL
Nitrite: NEGATIVE
Protein, ur: NEGATIVE mg/dL
Specific Gravity, Urine: 1.015 (ref 1.005–1.030)
Urobilinogen, UA: 0.2 mg/dL (ref 0.0–1.0)
pH: 7.5 (ref 5.0–8.0)

## 2012-06-03 MED ORDER — CEPHALEXIN 500 MG PO CAPS: 500.0000 mg | ORAL_CAPSULE | Freq: Four times a day (QID) | ORAL | Status: DC

## 2012-06-03 NOTE — ED Provider Notes (Signed)
History     CSN: [id number]  Arrival date & time 06/03/12  1843   First MD Initiated Contact with Patient 06/03/12 1852      Chief Complaint  Patient presents with  . Urinary Tract Infection    right flank pain. hx recurrent uti's. last uti 6 months ago.     (Consider location/radiation/quality/duration/timing/severity/associated sxs/prior treatment) Patient is a 57 y.o. female presenting with urinary tract infection. The history is provided by the patient.  Urinary Tract Infection This is a new problem. The current episode started more than 2 days ago (right flank discomfort for 2 days, today full blown uti sx, similar to 6 mos ago with uti.). The problem has been gradually worsening. Pertinent negatives include no abdominal pain.    Past Medical History  Diagnosis Date  . Allergy   . Anxiety   . Hx of adenomatous colonic polyps   . Thyroid disease     Hypothyroidism  . Lumbar disc disease   . Diverticulosis of colon   . Nephrolithiasis   . Hypotonic bladder     Hospitalized 06/2009 for UTI  . Chronic LBP 03/29/2011  . Obesity 03/29/2011  . History of renal stone 04/15/2011    Past Surgical History  Procedure Laterality Date  . Abdominal hysterectomy    . Cholecystectomy    . Ovarian cyst surgery    . Breast surgery  11/2007    Biopsy  . Cystoscopy  2008    Family History  Problem Relation Age of Onset  . Dementia Mother   . Colon cancer Mother 47  . Breast cancer Mother   . Dementia Father   . Colon cancer Father 87  . Heart disease Neg Hx   . Stomach cancer Neg Hx   . Hypertension Other   . Cancer Other     History  Substance Use Topics  . Smoking status: Former Smoker    Quit date: 09/13/1979  . Smokeless tobacco: Never Used  . Alcohol Use: No    OB History   Grav Para Term Preterm Abortions TAB SAB Ect Mult Living                  Review of Systems  Constitutional: Negative.  Negative for fever.  HENT: Negative.   Gastrointestinal:  Negative.  Negative for abdominal pain.  Genitourinary: Positive for dysuria, urgency and frequency. Negative for vaginal bleeding, vaginal discharge, menstrual problem and pelvic pain.    Allergies  Amoxicillin; Aspirin; Latex; Moxifloxacin; and Sulfonamide derivatives  Home Medications   Current Outpatient Rx  Name  Route  Sig  Dispense  Refill  . ALPRAZolam (XANAX) 0.25 MG tablet   Oral   Take 1 tablet (0.25 mg total) by mouth 3 (three) times daily as needed for anxiety. To fill jan 15,2014   35 tablet   0   . calcium carbonate (OS-CAL) 600 MG TABS   Oral   Take 600 mg by mouth daily.           . fexofenadine-pseudoephedrine (ALLEGRA-D) 60-120 MG per tablet   Oral   Take 1 tablet by mouth every 12 (twelve) hours.   30 tablet   1   . fluticasone (FLONASE) 50 MCG/ACT nasal spray   Nasal   Place 2 sprays into the nose daily.   16 g   1   . furosemide (LASIX) 40 MG tablet   Oral   Take 1 tablet (40 mg total) by mouth daily.   [place]  tablet   11   . levothyroxine (SYNTHROID, LEVOTHROID) 88 MCG tablet   Oral   Take 1 tablet (88 mcg total) by mouth daily.   90 tablet   3   . Multiple Vitamin (MULTIVITAMIN) tablet   Oral   Take 1 tablet by mouth daily.           . pantoprazole (PROTONIX) 40 MG tablet      TAKE ONE TABLET BY MOUTH EVERY DAY   90 tablet   3   . Potassium 99 MG TABS   Oral   Take 1 tablet by mouth daily.          . potassium chloride (KLOR-CON M10) 10 MEQ tablet   Oral   Take 1 tablet (10 mEq total) by mouth daily.   90 tablet   3   . acetaminophen (TYLENOL) 500 MG tablet   Oral   Take 500 mg by mouth every 6 (six) hours as needed.         [name] azithromycin (ZITHROMAX) 250 MG tablet   Oral   Take 1 tablet (250 mg total) by mouth daily.   6 tablet   0   . cephALEXin (KEFLEX) 500 MG capsule   Oral   Take 1 capsule (500 mg total) by mouth 4 (four) times daily. Take all of medicine and drink lots of fluids   20 capsule   0   .  guaiFENesin (MUCINEX) 600 MG 12 hr tablet   Oral   Take 1,200 mg by mouth 2 (two) times daily.         . mupirocin ointment (BACTROBAN) 2 %   Topical   Apply topically 3 (three) times daily.   22 g   0   . predniSONE (DELTASONE) 20 MG tablet      2 tabs by mouth daily for 5 days   10 tablet   0   . PROAIR HFA 108 (90 BASE) MCG/ACT inhaler      INHALE 2 PUFFS BY MOUTH EVERY 4-6 HOURS AS NEEDED   8.5 g   10     BP 123/86  Pulse 92  Temp(Src) 97.7 F (36.5 C) (Oral)  Resp 18  SpO2 99%  Physical Exam  Nursing note and vitals reviewed. Constitutional: She is oriented to person, place, and time. She appears well-developed and well-nourished.  HENT:  Head: Normocephalic.  Eyes: Conjunctivae are normal. Pupils are equal, round, and reactive to light.  Neck: Normal range of motion. Neck supple.  Cardiovascular: Normal rate and regular rhythm.   Pulmonary/Chest: Breath sounds normal.  Abdominal: Bowel sounds are normal. She exhibits no distension and no mass. There is tenderness in the suprapubic area. There is no rigidity, no rebound, no guarding and no CVA tenderness.  Neurological: She is alert and oriented to person, place, and time.  Skin: Skin is warm and dry.    ED Course  Procedures (including critical care time)  Labs Reviewed  POCT URINALYSIS DIP (DEVICE) - Abnormal; Notable for the following:    Hgb urine dipstick MODERATE (*)    Leukocytes, UA SMALL (*)    All other components within normal limits   No results found.   1. UTI (lower urinary tract infection)       MDM  U/a abnl.        Billy Fischer, MD 06/03/12 [phone]

## 2012-06-03 NOTE — ED Notes (Signed)
Pt c/o urinary symptoms. Right lower flank pain, burning, pressure and nausea.  Pt has a hx of recurrent uti's. Last uti was 6 months ago.  Pt has been drinking plenty of fluids.

## 2012-06-20 ENCOUNTER — Other Ambulatory Visit: Payer: Self-pay | Admitting: Internal Medicine

## 2012-06-20 ENCOUNTER — Encounter: Payer: Self-pay | Admitting: Internal Medicine

## 2012-06-20 MED ORDER — AZITHROMYCIN 250 MG PO TABS: 250.0000 mg | ORAL_TABLET | Freq: Every day | ORAL | Status: DC

## 2012-07-04 ENCOUNTER — Other Ambulatory Visit: Payer: Self-pay | Admitting: Internal Medicine

## 2012-07-29 ENCOUNTER — Encounter: Payer: Self-pay | Admitting: Internal Medicine

## 2012-07-29 ENCOUNTER — Other Ambulatory Visit: Payer: Self-pay

## 2012-07-29 MED ORDER — FUROSEMIDE 40 MG PO TABS: 40.0000 mg | ORAL_TABLET | Freq: Every day | ORAL | Status: DC

## 2012-07-29 MED ORDER — FLUTICASONE PROPIONATE 50 MCG/ACT NA SUSP: 2.0000 | Freq: Every day | NASAL | Status: DC

## 2012-07-29 MED ORDER — LEVOTHYROXINE SODIUM 88 MCG PO TABS: 88.0000 ug | ORAL_TABLET | Freq: Every day | ORAL | Status: DC

## 2012-07-30 ENCOUNTER — Other Ambulatory Visit: Payer: Self-pay

## 2012-07-30 ENCOUNTER — Encounter: Payer: Self-pay | Admitting: Internal Medicine

## 2012-07-30 MED ORDER — ALBUTEROL SULFATE HFA 108 (90 BASE) MCG/ACT IN AERS: 2.0000 | INHALATION_SPRAY | RESPIRATORY_TRACT | Status: DC | PRN

## 2012-08-09 ENCOUNTER — Ambulatory Visit: Payer: 59

## 2012-08-09 ENCOUNTER — Emergency Department (HOSPITAL_COMMUNITY): Payer: 59

## 2012-08-09 ENCOUNTER — Emergency Department (HOSPITAL_COMMUNITY)
Admission: EM | Admit: 2012-08-09 | Discharge: 2012-08-10 | Disposition: A | Discharge status: Home / Self Care | Payer: 59 | Attending: Emergency Medicine | Admitting: Emergency Medicine

## 2012-08-09 ENCOUNTER — Encounter (HOSPITAL_COMMUNITY): Payer: Self-pay | Admitting: Emergency Medicine

## 2012-08-09 ENCOUNTER — Ambulatory Visit (INDEPENDENT_AMBULATORY_CARE_PROVIDER_SITE_OTHER): Payer: 59 | Admitting: Emergency Medicine

## 2012-08-09 VITALS — BP 112/78 | HR 100 | Temp 97.9°F | Resp 16 | Ht 63.0 in | Wt 189.0 lb

## 2012-08-09 DIAGNOSIS — F411 Generalized anxiety disorder: Secondary | ICD-10-CM | POA: Insufficient documentation

## 2012-08-09 DIAGNOSIS — IMO0002 Reserved for concepts with insufficient information to code with codable children: Secondary | ICD-10-CM | POA: Insufficient documentation

## 2012-08-09 DIAGNOSIS — E669 Obesity, unspecified: Secondary | ICD-10-CM | POA: Insufficient documentation

## 2012-08-09 DIAGNOSIS — R110 Nausea: Secondary | ICD-10-CM | POA: Insufficient documentation

## 2012-08-09 DIAGNOSIS — Z860100 Personal history of colon polyps, unspecified: Secondary | ICD-10-CM | POA: Insufficient documentation

## 2012-08-09 DIAGNOSIS — R300 Dysuria: Secondary | ICD-10-CM

## 2012-08-09 DIAGNOSIS — Z90710 Acquired absence of both cervix and uterus: Secondary | ICD-10-CM | POA: Insufficient documentation

## 2012-08-09 DIAGNOSIS — Z8739 Personal history of other diseases of the musculoskeletal system and connective tissue: Secondary | ICD-10-CM | POA: Insufficient documentation

## 2012-08-09 DIAGNOSIS — N200 Calculus of kidney: Secondary | ICD-10-CM

## 2012-08-09 DIAGNOSIS — G8929 Other chronic pain: Secondary | ICD-10-CM | POA: Insufficient documentation

## 2012-08-09 DIAGNOSIS — Z87442 Personal history of urinary calculi: Secondary | ICD-10-CM | POA: Insufficient documentation

## 2012-08-09 DIAGNOSIS — E039 Hypothyroidism, unspecified: Secondary | ICD-10-CM | POA: Insufficient documentation

## 2012-08-09 DIAGNOSIS — R109 Unspecified abdominal pain: Secondary | ICD-10-CM | POA: Insufficient documentation

## 2012-08-09 DIAGNOSIS — Z8719 Personal history of other diseases of the digestive system: Secondary | ICD-10-CM | POA: Insufficient documentation

## 2012-08-09 DIAGNOSIS — Z87448 Personal history of other diseases of urinary system: Secondary | ICD-10-CM | POA: Insufficient documentation

## 2012-08-09 DIAGNOSIS — Z9889 Other specified postprocedural states: Secondary | ICD-10-CM | POA: Insufficient documentation

## 2012-08-09 DIAGNOSIS — Z79899 Other long term (current) drug therapy: Secondary | ICD-10-CM | POA: Insufficient documentation

## 2012-08-09 DIAGNOSIS — Z87891 Personal history of nicotine dependence: Secondary | ICD-10-CM | POA: Insufficient documentation

## 2012-08-09 DIAGNOSIS — Z9089 Acquired absence of other organs: Secondary | ICD-10-CM | POA: Insufficient documentation

## 2012-08-09 LAB — POCT URINALYSIS DIPSTICK
Bilirubin, UA: NEGATIVE
Blood, UA: NEGATIVE
Glucose, UA: NEGATIVE
Ketones, UA: NEGATIVE
Leukocytes, UA: NEGATIVE
Nitrite, UA: NEGATIVE
Protein, UA: NEGATIVE
Spec Grav, UA: 1.01
Urobilinogen, UA: 0.2
pH, UA: 7

## 2012-08-09 LAB — POCT UA - MICROSCOPIC ONLY
Bacteria, U Microscopic: NEGATIVE
Casts, Ur, LPF, POC: NEGATIVE
Crystals, Ur, HPF, POC: NEGATIVE
Epithelial cells, urine per micros: NEGATIVE
Mucus, UA: NEGATIVE
RBC, urine, microscopic: NEGATIVE
WBC, Ur, HPF, POC: NEGATIVE
Yeast, UA: NEGATIVE

## 2012-08-09 LAB — URINALYSIS, ROUTINE W REFLEX MICROSCOPIC
Bilirubin Urine: NEGATIVE
Glucose, UA: NEGATIVE mg/dL
Hgb urine dipstick: NEGATIVE
Ketones, ur: NEGATIVE mg/dL
Nitrite: NEGATIVE
Protein, ur: NEGATIVE mg/dL
Specific Gravity, Urine: 1.007 (ref 1.005–1.030)
Urobilinogen, UA: 0.2 mg/dL (ref 0.0–1.0)
pH: 6.5 (ref 5.0–8.0)

## 2012-08-09 LAB — URINE MICROSCOPIC-ADD ON

## 2012-08-09 LAB — POCT CBC
Granulocyte percent: 57.5 %G (ref 37–80)
HCT, POC: 48.2 % — AB (ref 37.7–47.9)
Hemoglobin: 15 g/dL (ref 12.2–16.2)
Lymph, poc: 2.5 (ref 0.6–3.4)
MCH, POC: 30.9 pg (ref 27–31.2)
MCHC: 31.1 g/dL — AB (ref 31.8–35.4)
MCV: 99.3 fL — AB (ref 80–97)
MID (cbc): 0.9 (ref 0–0.9)
MPV: 9 fL (ref 0–99.8)
POC Granulocyte: 4.5 (ref 2–6.9)
POC LYMPH PERCENT: 31.3 %L (ref 10–50)
POC MID %: 11.2 %M (ref 0–12)
Platelet Count, POC: 219 10*3/uL (ref 142–424)
RBC: 4.85 M/uL (ref 4.04–5.48)
RDW, POC: 12.4 %
WBC: 7.9 10*3/uL (ref 4.6–10.2)

## 2012-08-09 LAB — POCT I-STAT, CHEM 8
BUN: 15 mg/dL (ref 6–23)
Calcium, Ion: 1.14 mmol/L (ref 1.12–1.23)
Chloride: 100 mEq/L (ref 96–112)
Creatinine, Ser: 0.9 mg/dL (ref 0.50–1.10)
Glucose, Bld: 97 mg/dL (ref 70–99)
HCT: 42 % (ref 36.0–46.0)
Hemoglobin: 14.3 g/dL (ref 12.0–15.0)
Potassium: 3.2 mEq/L — ABNORMAL LOW (ref 3.5–5.1)
Sodium: 139 mEq/L (ref 135–145)
TCO2: 28 mmol/L (ref 0–100)

## 2012-08-09 MED ORDER — IOHEXOL 300 MG/ML  SOLN
100.0000 mL | Freq: Once | INTRAMUSCULAR | Status: AC | PRN
  Administered 2012-08-09: 100 mL via INTRAVENOUS

## 2012-08-09 MED ORDER — ONDANSETRON HCL 4 MG/2ML IJ SOLN
4.0000 mg | Freq: Once | INTRAMUSCULAR | Status: DC
  Filled 2012-08-09: qty 2

## 2012-08-09 MED ORDER — ONDANSETRON 8 MG PO TBDP
8.0000 mg | ORAL_TABLET | Freq: Once | ORAL | Status: AC
  Administered 2012-08-09: 8 mg via ORAL
  Filled 2012-08-09: qty 1

## 2012-08-09 MED ORDER — HYDROMORPHONE HCL PF 1 MG/ML IJ SOLN
1.0000 mg | Freq: Once | INTRAMUSCULAR | Status: DC
  Filled 2012-08-09: qty 1

## 2012-08-09 MED ORDER — POTASSIUM CHLORIDE CRYS ER 20 MEQ PO TBCR
40.0000 meq | EXTENDED_RELEASE_TABLET | Freq: Once | ORAL | Status: AC
  Administered 2012-08-09: 40 meq via ORAL
  Filled 2012-08-09: qty 2

## 2012-08-09 MED ORDER — HYDROMORPHONE HCL PF 1 MG/ML IJ SOLN
1.0000 mg | Freq: Once | INTRAMUSCULAR | Status: AC
  Administered 2012-08-09: 1 mg via INTRAMUSCULAR

## 2012-08-09 MED ORDER — HYDROMORPHONE HCL PF 1 MG/ML IJ SOLN
1.0000 mg | Freq: Once | INTRAMUSCULAR | Status: AC
  Administered 2012-08-09: 1 mg via INTRAMUSCULAR
  Filled 2012-08-09: qty 1

## 2012-08-09 MED ORDER — CEPHALEXIN 500 MG PO CAPS: 500.0000 mg | ORAL_CAPSULE | Freq: Two times a day (BID) | ORAL | Status: DC

## 2012-08-09 NOTE — Progress Notes (Signed)
Subjective:    Patient ID: [name], female    DOB: 10/27/1955, 57 y.o.   MRN: [id number]  HPI patient states she's been sick so last 3-4 days. She's been having pain on the right side along with nausea and chills. She has a history of kidney stones. She is normal patient of Dr. Denton Lank. She had a urinary tract infection earlier in the year which was treated with Keflex.    Review of Systems     Objective:   Physical Exam patient is a somewhat withdrawn but in no acute distress. Neck is supple. Chest is clear. Heart regular rate no murmurs. There is tenderness over the right lower rib margins. Abdomen reveals significant suprapubic tenderness without rebound.  Results for orders placed in visit on 08/09/12  POCT UA - MICROSCOPIC ONLY      Result Value Range   WBC, Ur, HPF, POC neg     RBC, urine, microscopic neg     Bacteria, U Microscopic neg     Mucus, UA neg     Epithelial cells, urine per micros neg     Crystals, Ur, HPF, POC neg     Casts, Ur, LPF, POC neg     Yeast, UA neg    POCT URINALYSIS DIPSTICK      Result Value Range   Color, UA yellow     Clarity, UA clear     Glucose, UA neg     Bilirubin, UA neg     Ketones, UA neg     Spec Grav, UA 1.010     Blood, UA neg     pH, UA 7.0     Protein, UA neg     Urobilinogen, UA 0.2     Nitrite, UA neg     Leukocytes, UA Negative     [facility] reading (PRIMARY) by  Dr. Everlene Farrier KUB does not show a definite stone  Results for orders placed in visit on 08/09/12  POCT UA - MICROSCOPIC ONLY      Result Value Range   WBC, Ur, HPF, POC neg     RBC, urine, microscopic neg     Bacteria, U Microscopic neg     Mucus, UA neg     Epithelial cells, urine per micros neg     Crystals, Ur, HPF, POC neg     Casts, Ur, LPF, POC neg     Yeast, UA neg    POCT URINALYSIS DIPSTICK      Result Value Range   Color, UA yellow     Clarity, UA clear     Glucose, UA neg     Bilirubin, UA neg     Ketones, UA neg     Spec Grav, UA 1.010     Blood, UA neg     pH, UA 7.0     Protein, UA neg     Urobilinogen, UA 0.2     Nitrite, UA neg     Leukocytes, UA Negative    POCT CBC      Result Value Range   WBC 7.9  4.6 - 10.2 K/uL   Lymph, poc 2.5  0.6 - 3.4   POC LYMPH PERCENT 31.3  10 - 50 %L   MID (cbc) 0.9  0 - 0.9   POC MID % 11.2  0 - 12 %M   POC Granulocyte 4.5  2 - 6.9   Granulocyte percent 57.5  37 - 80 %G   RBC 4.85  4.04 - 5.48 M/uL   Hemoglobin 15.0  12.2 - 16.2 g/dL   HCT, POC 48.2 (*) 37.7 - 47.9 %   MCV 99.3 (*) 80 - 97 fL   MCH, POC 30.9  27 - 31.2 pg   MCHC 31.1 (*) 31.8 - 35.4 g/dL   RDW, POC 12.4     Platelet Count, POC 219  142 - 424 K/uL   MPV 9.0  0 - 99.8 fL   [facility] reading (PRIMARY) by  Dr Everlene Farrier there is no definite stone seen       Assessment & Plan:  We'll treat with Keflex 500 twice a day she is given #14 is written prescription. She was given a strainer. She is to followup with Dr. Jeffie Pollock to consider having a CT to rule out a stone. She has a history of amoxicillin allergy however has taken Keflex in the past without problem . I really think her symptoms are more consistent with a stone

## 2012-08-09 NOTE — ED Provider Notes (Addendum)
History     CSN: [id number]  Arrival date & time 08/09/12  2055   First MD Initiated Contact with Patient 08/09/12 2122      Chief Complaint  Patient presents with  . Flank Pain    (Consider location/radiation/quality/duration/timing/severity/associated sxs/prior treatment) HPI Complains of burning with urination and urethral meatus and pain radiating to her right flank onset 3 days ago. Pain feels like kidney stone she's had in the past. She was seen at an urgent care center earlier today prescribed Keflex for which she's had one dose. Patient also treated with tramadol which alleviated pain for 3 hours. She admits to nausea no vomiting no fever. Pain is moderate to severe present nothing makes symptoms better or worse. Past Medical History  Diagnosis Date  . Allergy   . Anxiety   . Hx of adenomatous colonic polyps   . Thyroid disease     Hypothyroidism  . Lumbar disc disease   . Diverticulosis of colon   . Nephrolithiasis   . Hypotonic bladder     Hospitalized 06/2009 for UTI  . Chronic LBP 03/29/2011  . Obesity 03/29/2011  . History of renal stone 04/15/2011    Past Surgical History  Procedure Laterality Date  . Abdominal hysterectomy    . Cholecystectomy    . Ovarian cyst surgery    . Breast surgery  11/2007    Biopsy  . Cystoscopy  2008    Family History  Problem Relation Age of Onset  . Dementia Mother   . Colon cancer Mother 54  . Breast cancer Mother   . Dementia Father   . Colon cancer Father 44  . Heart disease Neg Hx   . Stomach cancer Neg Hx   . Hypertension Other   . Cancer Other     History  Substance Use Topics  . Smoking status: Former Smoker    Quit date: 09/13/1979  . Smokeless tobacco: Never Used  . Alcohol Use: No    OB History   Grav Para Term Preterm Abortions TAB SAB Ect Mult Living                  Review of Systems  Constitutional: Negative.   HENT: Negative.   Respiratory: Negative.   Cardiovascular: Negative.    Gastrointestinal: Positive for nausea.  Genitourinary: Positive for dysuria and flank pain.  Musculoskeletal: Negative.   Skin: Negative.   Neurological: Negative.   Psychiatric/Behavioral: Negative.   All other systems reviewed and are negative.    Allergies  Amoxicillin; Aspirin; Avelox; Latex; Moxifloxacin; and Sulfonamide derivatives  Home Medications   Current Outpatient Rx  Name  Route  Sig  Dispense  Refill  . acetaminophen (TYLENOL) 500 MG tablet   Oral   Take 500 mg by mouth every 6 (six) hours as needed.         [name] albuterol (PROAIR HFA) 108 (90 BASE) MCG/ACT inhaler   Inhalation   Inhale 2 puffs into the lungs every 4 (four) hours as needed for wheezing.   8.5 g   11   . ALPRAZolam (XANAX) 0.25 MG tablet   Oral   Take 1 tablet (0.25 mg total) by mouth 3 (three) times daily as needed for anxiety. To fill jan 15,2014   35 tablet   0   . calcium carbonate (OS-CAL) 600 MG TABS   Oral   Take 600 mg by mouth daily.           . cephALEXin (KEFLEX) 500  MG capsule   Oral   Take 1 capsule (500 mg total) by mouth 2 (two) times daily. Take all of medicine and drink lots of fluids   20 capsule   0   . fexofenadine-pseudoephedrine (ALLEGRA-D) 60-120 MG per tablet   Oral   Take 1 tablet by mouth 2 (two) times daily.         . fluticasone (FLONASE) 50 MCG/ACT nasal spray   Nasal   Place 2 sprays into the nose daily.   16 g   11   . furosemide (LASIX) 40 MG tablet   Oral   Take 1 tablet (40 mg total) by mouth daily.   30 tablet   11   . levothyroxine (SYNTHROID, LEVOTHROID) 88 MCG tablet   Oral   Take 1 tablet (88 mcg total) by mouth daily.   90 tablet   3   . Multiple Vitamin (MULTIVITAMIN) tablet   Oral   Take 1 tablet by mouth daily.           . potassium chloride (KLOR-CON M10) 10 MEQ tablet   Oral   Take 1 tablet (10 mEq total) by mouth daily.   90 tablet   3   . traMADol (ULTRAM) 50 MG tablet   Oral   Take 50 mg by mouth every 6  (six) hours as needed for pain.           BP 119/78  Pulse 84  Temp(Src) 98 F (36.7 C) (Oral)  Resp 16  Ht 5\' 3"  (1.6 m)  Wt 187 lb (84.823 kg)  BMI 33.13 kg/m2  SpO2 100%  Physical Exam  Nursing note and vitals reviewed. Constitutional: She is oriented to person, place, and time. She appears well-developed and well-nourished.  HENT:  Head: Normocephalic and atraumatic.  Eyes: Conjunctivae are normal. Pupils are equal, round, and reactive to light.  Neck: Neck supple. No tracheal deviation present. No thyromegaly present.  Cardiovascular: Normal rate and regular rhythm.   No murmur heard. Pulmonary/Chest: Effort normal and breath sounds normal.  Abdominal: Soft. Bowel sounds are normal. She exhibits no distension. There is no tenderness.  OBese  Genitourinary:  Right flank tenderness  Musculoskeletal: Normal range of motion. She exhibits no edema and no tenderness.  Neurological: She is alert and oriented to person, place, and time. Coordination normal.  Skin: Skin is warm and dry. No rash noted.  Psychiatric: She has a normal mood and affect.    ED Course  Procedures (including critical care time)  Labs Reviewed - No data to display Dg Abd 1 View  08/09/2012  *RADIOLOGY REPORT*  Clinical Data: Right abdominal pain with nausea for 4 days. History of kidney stones.  ABDOMEN - 1 VIEW  Comparison: Abdominal pelvic CT 04/09/2011 from [facility].  Findings: No calcifications are identified over either kidney or the expected course of the proximal ureters.  There are multiple pelvic calcifications bilaterally which are grossly stable and most consistent with phleboliths.  Cholecystectomy clips are noted.  The bowel gas pattern is normal.  There are no acute osseous findings.  IMPRESSION: Stable pelvic phleboliths.  No urinary tract calculus identified.   Original Report Authenticated By: Richardean Sale, M.D.    Results for orders placed during the hospital encounter of  08/09/12  URINALYSIS, ROUTINE W REFLEX MICROSCOPIC      Result Value Range   Color, Urine YELLOW  YELLOW   APPearance CLEAR  CLEAR   Specific Gravity, Urine 1.007  1.005 -  1.030   pH 6.5  5.0 - 8.0   Glucose, UA NEGATIVE  NEGATIVE mg/dL   Hgb urine dipstick NEGATIVE  NEGATIVE   Bilirubin Urine NEGATIVE  NEGATIVE   Ketones, ur NEGATIVE  NEGATIVE mg/dL   Protein, ur NEGATIVE  NEGATIVE mg/dL   Urobilinogen, UA 0.2  0.0 - 1.0 mg/dL   Nitrite NEGATIVE  NEGATIVE   Leukocytes, UA TRACE (*) NEGATIVE  URINE MICROSCOPIC-ADD ON      Result Value Range   Squamous Epithelial / LPF RARE  RARE   WBC, UA 3-6  <3 WBC/hpf   Bacteria, UA FEW (*) RARE  POCT I-STAT, CHEM 8      Result Value Range   Sodium 139  135 - 145 mEq/L   Potassium 3.2 (*) 3.5 - 5.1 mEq/L   Chloride 100  96 - 112 mEq/L   BUN 15  6 - 23 mg/dL   Creatinine, Ser 0.90  0.50 - 1.10 mg/dL   Glucose, Bld 97  70 - 99 mg/dL   Calcium, Ion 1.14  1.12 - 1.23 mmol/L   TCO2 28  0 - 100 mmol/L   Hemoglobin 14.3  12.0 - 15.0 g/dL   HCT 42.0  36.0 - 46.0 %   Dg Abd 1 View  08/09/2012  *RADIOLOGY REPORT*  Clinical Data: Right abdominal pain with nausea for 4 days. History of kidney stones.  ABDOMEN - 1 VIEW  Comparison: Abdominal pelvic CT 04/09/2011 from [facility].  Findings: No calcifications are identified over either kidney or the expected course of the proximal ureters.  There are multiple pelvic calcifications bilaterally which are grossly stable and most consistent with phleboliths.  Cholecystectomy clips are noted.  The bowel gas pattern is normal.  There are no acute osseous findings.  IMPRESSION: Stable pelvic phleboliths.  No urinary tract calculus identified.   Original Report Authenticated By: Richardean Sale, M.D.    Ct Abdomen Pelvis W Contrast  08/09/2012  *RADIOLOGY REPORT*  Clinical Data: Right flank pain radiating to right abdomen, sever pain, nausea, question kidney stones and infection, past history kidney stones   CT ABDOMEN AND PELVIS WITH CONTRAST  Technique:  Multidetector CT imaging of the abdomen and pelvis was performed following the standard protocol during bolus administration of intravenous contrast.No oral contrast administered.  Contrast: 13mL OMNIPAQUE IOHEXOL 300 MG/ML  SOLN; no oral contrast given  Comparison: 04/09/2011  Findings: Lung bases clear. Diffuse fatty infiltration of liver. Question tiny nonobstructing calculus mid right kidney. Small cyst upper pole right kidney 10 mm diameter. Liver, spleen, pancreas, kidneys, and adrenal glands otherwise normal appearance. No hydronephrosis or hydroureter. Bladder unremarkable. Normal appendix. Mild sigmoid diverticulosis without evidence of diverticulitis. Stomach and bowel loops unremarkable for technique. No mass, adenopathy, free fluid or inflammatory process. Uterus surgically absent with normal sized ovaries noted. No hernia or acute bony lesion.  IMPRESSION: Small right renal cyst with questionable tiny nonobstructing right renal calculus. No evidence of urinary tract dilatation or obstruction. Diffuse fatty infiltration of liver. Post cholecystectomy. Sigmoid diverticulosis.   Original Report Authenticated By: Lavonia Dana, M.D.      No diagnosis found.  12:05 AM patient feels improved after treatment with intramuscular hydromorphone and oral Zofran. She is able to drink several ounces of water. She vomited one time prior to discharge and at time of discharge he is not nauseated and able to drink without further nausea She feels ready to go home MDM  Suspect ureteral colic based on history Plan prescription  Percocet, as tramadol has not adequately treated her pain. She is advised not to take Percocet together with Xanax. Prescription Zofran, followup Dr.Wrenn Diagnosis #1 flank pain  #2 hypokalemia       Orlie Dakin, MD 08/10/12 Benancio Deeds  Orlie Dakin, MD 08/10/12 0040  12:45 AM patient vomited again. Reglan ordered will continue to  observe  Orlie Dakin, MD 08/10/12 [phone]

## 2012-08-09 NOTE — ED Notes (Signed)
Pt c/o R flank pain radiating to R abd x 3-4 days, pt seen at Urgent care today, given rx for keflex told to f/u with Dr. Jeffie Pollock for possible CT for stones. Pt states she tried using tramadol for pain, pain now severe. +nausea.

## 2012-08-10 MED ORDER — ONDANSETRON HCL 4 MG PO TABS: 8.0000 mg | ORAL_TABLET | Freq: Four times a day (QID) | ORAL | Status: DC

## 2012-08-10 MED ORDER — OXYCODONE-ACETAMINOPHEN 5-325 MG PO TABS: 1.0000 | ORAL_TABLET | Freq: Four times a day (QID) | ORAL | Status: DC | PRN

## 2012-08-10 MED ORDER — METOCLOPRAMIDE HCL 5 MG/ML IJ SOLN
10.0000 mg | Freq: Once | INTRAMUSCULAR | Status: AC
  Administered 2012-08-10: 10 mg via INTRAMUSCULAR
  Filled 2012-08-10: qty 2

## 2012-08-10 NOTE — ED Provider Notes (Cosign Needed)
Pt care assumed from Orlie Dakin, MD.  Pt with flank pain and likely kidney stone.  Pt pain controlled, but continues to vomit.  VSS.    Plan: re-dose antiemetic and ensure pt tolerates fluids prior to d/c.  2:34 AM Pt without emesis since administration of Reglan 10mg  IM.  Pt states she is feeling much better and is ready to go home.  Pt tolerating PO without difficulty.  I have also discussed reasons to return immediately to the ER.  Patient expresses understanding and agrees with plan.    Jarrett Soho Sari Cogan, PA-C 08/10/12 [phone]

## 2012-08-11 LAB — URINE CULTURE: Colony Count: 65000

## 2012-08-13 ENCOUNTER — Emergency Department (HOSPITAL_COMMUNITY): Payer: 59

## 2012-08-13 ENCOUNTER — Emergency Department (HOSPITAL_COMMUNITY)
Admission: EM | Admit: 2012-08-13 | Discharge: 2012-08-13 | Disposition: A | Discharge status: Home / Self Care | Payer: 59 | Attending: Emergency Medicine | Admitting: Emergency Medicine

## 2012-08-13 ENCOUNTER — Encounter (HOSPITAL_COMMUNITY): Payer: Self-pay | Admitting: Emergency Medicine

## 2012-08-13 ENCOUNTER — Telehealth: Payer: Self-pay

## 2012-08-13 ENCOUNTER — Ambulatory Visit: Payer: 59 | Admitting: Internal Medicine

## 2012-08-13 DIAGNOSIS — I10 Essential (primary) hypertension: Secondary | ICD-10-CM | POA: Insufficient documentation

## 2012-08-13 DIAGNOSIS — Z87891 Personal history of nicotine dependence: Secondary | ICD-10-CM | POA: Insufficient documentation

## 2012-08-13 DIAGNOSIS — G8929 Other chronic pain: Secondary | ICD-10-CM | POA: Insufficient documentation

## 2012-08-13 DIAGNOSIS — F411 Generalized anxiety disorder: Secondary | ICD-10-CM | POA: Insufficient documentation

## 2012-08-13 DIAGNOSIS — E119 Type 2 diabetes mellitus without complications: Secondary | ICD-10-CM | POA: Insufficient documentation

## 2012-08-13 DIAGNOSIS — R079 Chest pain, unspecified: Secondary | ICD-10-CM | POA: Insufficient documentation

## 2012-08-13 DIAGNOSIS — E669 Obesity, unspecified: Secondary | ICD-10-CM | POA: Insufficient documentation

## 2012-08-13 DIAGNOSIS — IMO0002 Reserved for concepts with insufficient information to code with codable children: Secondary | ICD-10-CM | POA: Insufficient documentation

## 2012-08-13 DIAGNOSIS — Z91040 Latex allergy status: Secondary | ICD-10-CM | POA: Insufficient documentation

## 2012-08-13 DIAGNOSIS — Z8719 Personal history of other diseases of the digestive system: Secondary | ICD-10-CM | POA: Insufficient documentation

## 2012-08-13 DIAGNOSIS — E039 Hypothyroidism, unspecified: Secondary | ICD-10-CM | POA: Insufficient documentation

## 2012-08-13 DIAGNOSIS — M545 Low back pain, unspecified: Secondary | ICD-10-CM | POA: Insufficient documentation

## 2012-08-13 DIAGNOSIS — Z860100 Personal history of colon polyps, unspecified: Secondary | ICD-10-CM | POA: Insufficient documentation

## 2012-08-13 DIAGNOSIS — R5381 Other malaise: Secondary | ICD-10-CM | POA: Insufficient documentation

## 2012-08-13 DIAGNOSIS — Z87448 Personal history of other diseases of urinary system: Secondary | ICD-10-CM | POA: Insufficient documentation

## 2012-08-13 DIAGNOSIS — Z87442 Personal history of urinary calculi: Secondary | ICD-10-CM | POA: Insufficient documentation

## 2012-08-13 DIAGNOSIS — Z862 Personal history of diseases of the blood and blood-forming organs and certain disorders involving the immune mechanism: Secondary | ICD-10-CM | POA: Insufficient documentation

## 2012-08-13 DIAGNOSIS — Z8639 Personal history of other endocrine, nutritional and metabolic disease: Secondary | ICD-10-CM | POA: Insufficient documentation

## 2012-08-13 LAB — POCT I-STAT, CHEM 8
BUN: 15 mg/dL (ref 6–23)
Calcium, Ion: 1.14 mmol/L (ref 1.12–1.23)
Chloride: 104 mEq/L (ref 96–112)
Creatinine, Ser: 0.9 mg/dL (ref 0.50–1.10)
Glucose, Bld: 88 mg/dL (ref 70–99)
HCT: 42 % (ref 36.0–46.0)
Hemoglobin: 14.3 g/dL (ref 12.0–15.0)
Potassium: 4.3 mEq/L (ref 3.5–5.1)
Sodium: 141 mEq/L (ref 135–145)
TCO2: 29 mmol/L (ref 0–100)

## 2012-08-13 LAB — CBC
HCT: 40.9 % (ref 36.0–46.0)
Hemoglobin: 14.4 g/dL (ref 12.0–15.0)
MCH: 32.3 pg (ref 26.0–34.0)
MCHC: 35.2 g/dL (ref 30.0–36.0)
MCV: 91.7 fL (ref 78.0–100.0)
Platelets: 220 10*3/uL (ref 150–400)
RBC: 4.46 MIL/uL (ref 3.87–5.11)
RDW: 12.3 % (ref 11.5–15.5)
WBC: 7.3 10*3/uL (ref 4.0–10.5)

## 2012-08-13 LAB — GLUCOSE, CAPILLARY: Glucose-Capillary: 89 mg/dL (ref 70–99)

## 2012-08-13 LAB — TROPONIN I
Troponin I: <0.3 ng/mL (ref <0.30)
Troponin I: <0.3 ng/mL (ref <0.30)

## 2012-08-13 MED ORDER — MORPHINE SULFATE 4 MG/ML IJ SOLN
4.0000 mg | Freq: Once | INTRAMUSCULAR | Status: AC
  Administered 2012-08-13: 4 mg via INTRAVENOUS
  Filled 2012-08-13: qty 1

## 2012-08-13 MED ORDER — ONDANSETRON HCL 4 MG/2ML IJ SOLN
4.0000 mg | Freq: Once | INTRAMUSCULAR | Status: AC
  Administered 2012-08-13: 4 mg via INTRAVENOUS
  Filled 2012-08-13: qty 2

## 2012-08-13 MED ORDER — GI COCKTAIL ~~LOC~~
30.0000 mL | Freq: Once | ORAL | Status: AC
  Administered 2012-08-13: 30 mL via ORAL
  Filled 2012-08-13: qty 30

## 2012-08-13 MED ORDER — OMEPRAZOLE 20 MG PO CPDR: 20.0000 mg | DELAYED_RELEASE_CAPSULE | Freq: Every day | ORAL | Status: DC

## 2012-08-13 MED ORDER — IOHEXOL 350 MG/ML SOLN
100.0000 mL | Freq: Once | INTRAVENOUS | Status: AC | PRN
  Administered 2012-08-13: 100 mL via INTRAVENOUS

## 2012-08-13 NOTE — Telephone Encounter (Signed)
Message copied by Jamesetta Orleans on Tue Aug 13, 2012  1:22 PM ------      Message from: Sherral Hammers      Created: Tue Aug 13, 2012  1:02 PM      Regarding: ER       Ms Glascock was on Regina's schedule for today.  Her husband called to say she is in the ER. ------

## 2012-08-13 NOTE — ED Provider Notes (Signed)
History     CSN: [id number]  Arrival date & time 08/13/12  1143   First MD Initiated Contact with Patient 08/13/12 1152      Chief Complaint  Patient presents with  . Chest Pain  . 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  He was sudden and acute in onset.  Radiated through to her back and upper left arm.  She felt heaviness in her chest.  She's never had pain like this before.  She didn't develop a headache and some radiation of her pain and her left arm with a sense of heaviness in her left arm.  No prior history of cardiac disease.  She denies a history of hypertension, hyperlipidemia, diabetes.  She used to smoke cigarettes but quit in 1981. No family history of early cardiac disease.  Husband states that when she doesn't feel well she tends to get an abnormal speech pattern which is similar to her current speech pattern.   Past Medical History  Diagnosis Date  . Allergy   . Anxiety   . Hx of adenomatous colonic polyps   . Thyroid disease     Hypothyroidism  . Lumbar disc disease   . Diverticulosis of colon   . Nephrolithiasis   . Hypotonic bladder     Hospitalized 06/2009 for UTI  . Chronic LBP 03/29/2011  . Obesity 03/29/2011  . History of renal stone 04/15/2011    Past Surgical History  Procedure Laterality Date  . Abdominal hysterectomy    . Cholecystectomy    . Ovarian cyst surgery    . Breast surgery  11/2007    Biopsy  . Cystoscopy  2008    Family History  Problem Relation Age of Onset  . Dementia Mother   . Colon cancer Mother 2  . Breast cancer Mother   . Dementia Father   . Colon cancer Father 55  . Heart disease Neg Hx   . Stomach cancer Neg Hx   . Hypertension Other   . Cancer Other     History  Substance Use Topics  . Smoking status: Former Smoker    Quit date: 09/13/1979  . Smokeless  tobacco: Never Used  . Alcohol Use: No    OB History   Grav Para Term Preterm Abortions TAB SAB Ect Mult Living                  Review of Systems  All other systems reviewed and are negative.    Allergies  Amoxicillin; Aspirin; Avelox; Latex; Moxifloxacin; and Sulfonamide derivatives  Home Medications   Current Outpatient Rx  Name  Route  Sig  Dispense  Refill  . acetaminophen (TYLENOL) 500 MG tablet   Oral   Take 500 mg by mouth every 6 (six) hours as needed.         [name] albuterol (PROAIR HFA) 108 (90 BASE) MCG/ACT inhaler   Inhalation   Inhale 2 puffs into the lungs every 4 (four) hours as needed for wheezing.   8.5 g   11   . ALPRAZolam (XANAX) 0.25 MG tablet   Oral   Take 1 tablet (0.25 mg total) by mouth 3 (three) times daily as needed for anxiety. To fill jan 15,2014   35 tablet   0   .  calcium carbonate (OS-CAL) 600 MG TABS   Oral   Take 600 mg by mouth daily.           . cephALEXin (KEFLEX) 500 MG capsule   Oral   Take 1 capsule (500 mg total) by mouth 2 (two) times daily. Take all of medicine and drink lots of fluids   20 capsule   0   . fexofenadine-pseudoephedrine (ALLEGRA-D) 60-120 MG per tablet   Oral   Take 1 tablet by mouth 2 (two) times daily.         . fluticasone (FLONASE) 50 MCG/ACT nasal spray   Nasal   Place 2 sprays into the nose daily.   16 g   11   . furosemide (LASIX) 40 MG tablet   Oral   Take 1 tablet (40 mg total) by mouth daily.   30 tablet   11   . levothyroxine (SYNTHROID, LEVOTHROID) 88 MCG tablet   Oral   Take 1 tablet (88 mcg total) by mouth daily.   90 tablet   3   . Multiple Vitamin (MULTIVITAMIN) tablet   Oral   Take 1 tablet by mouth daily.           . ondansetron (ZOFRAN) 4 MG tablet   Oral   Take 2 tablets (8 mg total) by mouth every 6 (six) hours.   20 tablet   0   . oxyCODONE-acetaminophen (PERCOCET/ROXICET) 5-325 MG per tablet   Oral   Take 1 tablet by mouth every 6 (six) hours as  needed for pain.   15 tablet   0   . potassium chloride (KLOR-CON M10) 10 MEQ tablet   Oral   Take 1 tablet (10 mEq total) by mouth daily.   90 tablet   3   . traMADol (ULTRAM) 50 MG tablet   Oral   Take 50 mg by mouth every 6 (six) hours as needed for pain.           BP 143/90  Pulse 86  Temp(Src) 97.7 F (36.5 C) (Oral)  Resp 18  SpO2 100%  Physical Exam  Nursing note and vitals reviewed. Constitutional: She is oriented to person, place, and time. She appears well-developed and well-nourished. No distress.  HENT:  Head: Normocephalic and atraumatic.  Eyes: EOM are normal.  Neck: Normal range of motion.  Cardiovascular: Normal rate, regular rhythm and normal heart sounds.   Pulmonary/Chest: Effort normal and breath sounds normal.  Abdominal: Soft. She exhibits no distension. There is no tenderness.  Musculoskeletal: Normal range of motion.  Neurological: She is alert and oriented to person, place, and time.  Skin: Skin is warm and dry.  Psychiatric: She has a normal mood and affect. Judgment normal.    ED Course  Procedures (including critical care time)   Date: 08/13/2012  Rate: 96  Rhythm: normal sinus rhythm  QRS Axis: normal  Intervals: normal  ST/T Wave abnormalities: normal  Conduction Disutrbances: none  Narrative Interpretation:   Old EKG Reviewed: No significant changes noted   Date: 08/13/2012  1529 (REPEAT ECG)  Rate: 69  Rhythm: normal sinus rhythm  QRS Axis: normal  Intervals: normal  ST/T Wave abnormalities: normal  Conduction Disutrbances: none  Narrative Interpretation:   Old EKG Reviewed: No significant changes noted  Apiration of blood/fluid Performed by: Hoy Morn Consent obtained. Required items: required blood products, implants, devices, and special equipment available Patient identity confirmed: verbally with patient Time out: Immediately prior to procedure a "time out"  was called to verify the correct patient,  procedure, equipment, support staff and site/side marked as required. Preparation: Patient was prepped and draped in the usual sterile fashion. Patient tolerance: Patient tolerated the procedure well with no immediate complications. Location of aspiration: right basilic vein for blood draw as nursing and phlebotomy unable to obtain      Labs Reviewed  GLUCOSE, CAPILLARY  CBC  TROPONIN I  TROPONIN I  POCT I-STAT, CHEM 8   Ct Angio Chest W/cm &/or Wo Cm  08/13/2012  *RADIOLOGY REPORT*  Clinical Data:  Sudden onset chest pain, evaluate for aortic dissection  CT ANGIOGRAPHY CHEST, ABDOMEN AND PELVIS  Technique:  Multidetector CT imaging through the chest, abdomen and pelvis was performed using the standard protocol during bolus administration of intravenous contrast.  Multiplanar reconstructed images including MIPs were obtained and reviewed to evaluate the vascular anatomy.  Contrast: 162mL OMNIPAQUE IOHEXOL 350 MG/ML SOLN  Comparison:  Prior CT abdomen/pelvis 04/09/2011; prior CT chest 11/06/2007  CTA CHEST  Findings:  Mediastinum: Unremarkable CT appearance of the thyroid gland. Prominent right posterosuperior pericardial reflection incidentally noted.  Prominent right hilar nodal tissue remains unchanged at 9 x 19 mm.  No soft tissue mediastinal mass.  The thoracic esophagus is unremarkable.  Heart/Vascular: There is a bovine configuration of the aortic arch (two vessel arch with common origin of the brachiocephalic and left common carotid arteries), a normal anatomic variant.  No evidence of acute dissection or aneurysmal dilatation.  Heart is within normal limits for size.  No pericardial effusion.  An embolism identified within the well opacified pulmonary arteries.  Lungs/Pleura: Trace dependent atelectasis in the lower lobes. Otherwise, the lungs are clear.  Bones: No acute fracture or aggressive appearing lytic or blastic osseous lesion.   Review of the MIP images confirms the above findings.   IMPRESSION:  No acute cardiopulmonary abnormality.  Specifically, no evidence of thoracic aortic dissection or intramural hematoma.  CTA ABDOMEN AND PELVIS  Findings:  VASCULAR  Aorta: Normal caliber abdominal aorta without significant atherosclerotic vascular disease, aneurysmal dilatation or dissection.  Celiac: Had patent with conventional hepatic arterial anatomy.  SMA: Widely patent.  Renals: Single dominant renal arteries bilaterally which are patent.  No evidence of fibromuscular dysplasia or dissection.  IMA: Widely patent.  Inflow: No significant atherosclerotic vascular disease.  Proximal Outflow: No significant atherosclerotic vascular disease.  Veins: Given the limitations of non venous phase timing, no significant venous abnormality identified.  NON-VASCULAR  Abdomen: Unremarkable CT appearance of the stomach, duodenum, spleen, adrenal glands and pancreas.  Diffuse low attenuation of the hepatic parenchyma consistent with advanced fatty infiltration. No focal lesion identified.  The portal veins are patent.  Surgical changes of prior cholecystectomy.  No intra or extrahepatic biliary ductal dilatation.  Unremarkable CT appearance of the kidneys without evidence of hydronephrosis, enhancing solid mass or nephrolithiasis.  Normal-caliber large and small bowel throughout the abdomen.  No evidence of obstruction.  Scattered diverticular disease in the descending and sigmoid colon without evidence of inflammation to suggest diverticulitis.  Normal appendix in the right lower quadrant.  No free fluid or suspicious adenopathy.  Any fat containing umbilical hernia.  Pelvis: The bladder is distended with urine.  Surgical changes of prior hysterectomy.  Bilateral adnexa are unremarkable.  No free fluid or suspicious adenopathy.  Bones: No acute fracture or aggressive appearing lytic or blastic osseous lesion.   Review of the MIP images confirms the above findings.  IMPRESSION:  1.  No acute abnormality  in the  abdomen or pelvis. 2.  Borderline hepatomegaly with extensive hepatic steatosis. Recommend clinical correlation with serum LFTs.  3.  Surgical changes of prior cholecystectomy and hysterectomy. 4.  Descending and sigmoid colonic diverticulosis.   Original Report Authenticated By: Jacqulynn Cadet, M.D.    Ct Angio Pelvis W/cm &/or Wo/cm  08/13/2012  *RADIOLOGY REPORT*  Clinical Data:  Sudden onset chest pain, evaluate for aortic dissection  CT ANGIOGRAPHY CHEST, ABDOMEN AND PELVIS  Technique:  Multidetector CT imaging through the chest, abdomen and pelvis was performed using the standard protocol during bolus administration of intravenous contrast.  Multiplanar reconstructed images including MIPs were obtained and reviewed to evaluate the vascular anatomy.  Contrast: 184mL OMNIPAQUE IOHEXOL 350 MG/ML SOLN  Comparison:  Prior CT abdomen/pelvis 04/09/2011; prior CT chest 11/06/2007  CTA CHEST  Findings:  Mediastinum: Unremarkable CT appearance of the thyroid gland. Prominent right posterosuperior pericardial reflection incidentally noted.  Prominent right hilar nodal tissue remains unchanged at 9 x 19 mm.  No soft tissue mediastinal mass.  The thoracic esophagus is unremarkable.  Heart/Vascular: There is a bovine configuration of the aortic arch (two vessel arch with common origin of the brachiocephalic and left common carotid arteries), a normal anatomic variant.  No evidence of acute dissection or aneurysmal dilatation.  Heart is within normal limits for size.  No pericardial effusion.  An embolism identified within the well opacified pulmonary arteries.  Lungs/Pleura: Trace dependent atelectasis in the lower lobes. Otherwise, the lungs are clear.  Bones: No acute fracture or aggressive appearing lytic or blastic osseous lesion.   Review of the MIP images confirms the above findings.  IMPRESSION:  No acute cardiopulmonary abnormality.  Specifically, no evidence of thoracic aortic dissection or intramural  hematoma.  CTA ABDOMEN AND PELVIS  Findings:  VASCULAR  Aorta: Normal caliber abdominal aorta without significant atherosclerotic vascular disease, aneurysmal dilatation or dissection.  Celiac: Had patent with conventional hepatic arterial anatomy.  SMA: Widely patent.  Renals: Single dominant renal arteries bilaterally which are patent.  No evidence of fibromuscular dysplasia or dissection.  IMA: Widely patent.  Inflow: No significant atherosclerotic vascular disease.  Proximal Outflow: No significant atherosclerotic vascular disease.  Veins: Given the limitations of non venous phase timing, no significant venous abnormality identified.  NON-VASCULAR  Abdomen: Unremarkable CT appearance of the stomach, duodenum, spleen, adrenal glands and pancreas.  Diffuse low attenuation of the hepatic parenchyma consistent with advanced fatty infiltration. No focal lesion identified.  The portal veins are patent.  Surgical changes of prior cholecystectomy.  No intra or extrahepatic biliary ductal dilatation.  Unremarkable CT appearance of the kidneys without evidence of hydronephrosis, enhancing solid mass or nephrolithiasis.  Normal-caliber large and small bowel throughout the abdomen.  No evidence of obstruction.  Scattered diverticular disease in the descending and sigmoid colon without evidence of inflammation to suggest diverticulitis.  Normal appendix in the right lower quadrant.  No free fluid or suspicious adenopathy.  Any fat containing umbilical hernia.  Pelvis: The bladder is distended with urine.  Surgical changes of prior hysterectomy.  Bilateral adnexa are unremarkable.  No free fluid or suspicious adenopathy.  Bones: No acute fracture or aggressive appearing lytic or blastic osseous lesion.   Review of the MIP images confirms the above findings.  IMPRESSION:  1.  No acute abnormality in the abdomen or pelvis. 2.  Borderline hepatomegaly with extensive hepatic steatosis. Recommend clinical correlation with serum  LFTs.  3.  Surgical changes of prior cholecystectomy and hysterectomy. 4.  Descending and sigmoid colonic diverticulosis.   Original Report Authenticated By: Jacqulynn Cadet, M.D.    Ct Angio Abdomen W/cm &/or Wo Contrast  08/13/2012  *RADIOLOGY REPORT*  Clinical Data:  Sudden onset chest pain, evaluate for aortic dissection  CT ANGIOGRAPHY CHEST, ABDOMEN AND PELVIS  Technique:  Multidetector CT imaging through the chest, abdomen and pelvis was performed using the standard protocol during bolus administration of intravenous contrast.  Multiplanar reconstructed images including MIPs were obtained and reviewed to evaluate the vascular anatomy.  Contrast: 137mL OMNIPAQUE IOHEXOL 350 MG/ML SOLN  Comparison:  Prior CT abdomen/pelvis 04/09/2011; prior CT chest 11/06/2007  CTA CHEST  Findings:  Mediastinum: Unremarkable CT appearance of the thyroid gland. Prominent right posterosuperior pericardial reflection incidentally noted.  Prominent right hilar nodal tissue remains unchanged at 9 x 19 mm.  No soft tissue mediastinal mass.  The thoracic esophagus is unremarkable.  Heart/Vascular: There is a bovine configuration of the aortic arch (two vessel arch with common origin of the brachiocephalic and left common carotid arteries), a normal anatomic variant.  No evidence of acute dissection or aneurysmal dilatation.  Heart is within normal limits for size.  No pericardial effusion.  An embolism identified within the well opacified pulmonary arteries.  Lungs/Pleura: Trace dependent atelectasis in the lower lobes. Otherwise, the lungs are clear.  Bones: No acute fracture or aggressive appearing lytic or blastic osseous lesion.   Review of the MIP images confirms the above findings.  IMPRESSION:  No acute cardiopulmonary abnormality.  Specifically, no evidence of thoracic aortic dissection or intramural hematoma.  CTA ABDOMEN AND PELVIS  Findings:  VASCULAR  Aorta: Normal caliber abdominal aorta without significant  atherosclerotic vascular disease, aneurysmal dilatation or dissection.  Celiac: Had patent with conventional hepatic arterial anatomy.  SMA: Widely patent.  Renals: Single dominant renal arteries bilaterally which are patent.  No evidence of fibromuscular dysplasia or dissection.  IMA: Widely patent.  Inflow: No significant atherosclerotic vascular disease.  Proximal Outflow: No significant atherosclerotic vascular disease.  Veins: Given the limitations of non venous phase timing, no significant venous abnormality identified.  NON-VASCULAR  Abdomen: Unremarkable CT appearance of the stomach, duodenum, spleen, adrenal glands and pancreas.  Diffuse low attenuation of the hepatic parenchyma consistent with advanced fatty infiltration. No focal lesion identified.  The portal veins are patent.  Surgical changes of prior cholecystectomy.  No intra or extrahepatic biliary ductal dilatation.  Unremarkable CT appearance of the kidneys without evidence of hydronephrosis, enhancing solid mass or nephrolithiasis.  Normal-caliber large and small bowel throughout the abdomen.  No evidence of obstruction.  Scattered diverticular disease in the descending and sigmoid colon without evidence of inflammation to suggest diverticulitis.  Normal appendix in the right lower quadrant.  No free fluid or suspicious adenopathy.  Any fat containing umbilical hernia.  Pelvis: The bladder is distended with urine.  Surgical changes of prior hysterectomy.  Bilateral adnexa are unremarkable.  No free fluid or suspicious adenopathy.  Bones: No acute fracture or aggressive appearing lytic or blastic osseous lesion.   Review of the MIP images confirms the above findings.  IMPRESSION:  1.  No acute abnormality in the abdomen or pelvis. 2.  Borderline hepatomegaly with extensive hepatic steatosis. Recommend clinical correlation with serum LFTs.  3.  Surgical changes of prior cholecystectomy and hysterectomy. 4.  Descending and sigmoid colonic  diverticulosis.   Original Report Authenticated By: Jacqulynn Cadet, M.D.    Dg Chest Portable 1 View  08/13/2012  *RADIOLOGY REPORT*  Clinical Data: Chest  pain  PORTABLE CHEST - 1 VIEW  Comparison: 04/14/2011  Findings: The heart and pulmonary vascularity are within normal limits.  The lungs are clear bilaterally.  No acute bony abnormality is seen.  IMPRESSION: No acute abnormality noted.   Original Report Authenticated By: Inez Catalina, M.D.     I personally reviewed the imaging tests through PACS system I reviewed available ER/hospitalization records through the EMR    1. Chest pain       MDM   11:56 AM 134/80 Left arm 143/90 Right arm  3:28 PM CT NG without evidence of aortic dissection.  Patient's pain is improving.  She continues to have some mild nausea and chest discomfort this time.  The troponin pending.  EKG pending.  My suspicion for ACS is very low.  Feels much better at time of discharge, zofran and GI cocktail helped pt feel much better       Hoy Morn, MD 08/14/12 2158

## 2012-08-13 NOTE — ED Notes (Signed)
Pt c/o generalized weakness and chest pain described as heaviness. Sudden onset a few minutes ago while at lunch. Slurred speech, described as "just not feeling well".

## 2012-08-14 ENCOUNTER — Encounter: Payer: Self-pay | Admitting: Internal Medicine

## 2012-08-14 ENCOUNTER — Ambulatory Visit (INDEPENDENT_AMBULATORY_CARE_PROVIDER_SITE_OTHER): Payer: 59 | Admitting: Internal Medicine

## 2012-08-14 ENCOUNTER — Other Ambulatory Visit: Payer: Self-pay | Admitting: Internal Medicine

## 2012-08-14 ENCOUNTER — Telehealth: Payer: Self-pay | Admitting: Internal Medicine

## 2012-08-14 VITALS — BP 114/66 | HR 78 | Temp 98.6°F

## 2012-08-14 DIAGNOSIS — E876 Hypokalemia: Secondary | ICD-10-CM

## 2012-08-14 DIAGNOSIS — R609 Edema, unspecified: Secondary | ICD-10-CM

## 2012-08-14 DIAGNOSIS — K219 Gastro-esophageal reflux disease without esophagitis: Secondary | ICD-10-CM

## 2012-08-14 MED ORDER — PANTOPRAZOLE SODIUM 40 MG PO TBEC: 40.0000 mg | DELAYED_RELEASE_TABLET | Freq: Every day | ORAL | Status: DC

## 2012-08-14 MED ORDER — POTASSIUM CHLORIDE CRYS ER 20 MEQ PO TBCR: 20.0000 meq | EXTENDED_RELEASE_TABLET | Freq: Every day | ORAL | Status: DC

## 2012-08-14 MED ORDER — NITROGLYCERIN 0.4 MG SL SUBL: 0.4000 mg | SUBLINGUAL_TABLET | SUBLINGUAL | Status: DC | PRN

## 2012-08-14 NOTE — Telephone Encounter (Signed)
i have called it into her pharmacy

## 2012-08-14 NOTE — Assessment & Plan Note (Signed)
Rx for 20 meq potassium chloride Recheck potassium in 1 month

## 2012-08-14 NOTE — Telephone Encounter (Signed)
The patient asked about if she was prescribed Nitrogyclerin (spelling.[name]) to carry with her.  Her call back number [phone]

## 2012-08-14 NOTE — Progress Notes (Signed)
Subjective:    Patient ID: [name], female    DOB: 1956/02/26, 57 y.o.   MRN: [id number]  HPI  Pt presents to the clinic today to f/u her ER visit from yesterday. She was sitting at her desk eating lunch, when she suddenly developed chest tightness with radiating pain down her left arm. She had never had pain like this in the past. She went to the ER where they ruled her out for ACS or PE. They did give her a GI cocktail and her pain resolved. They put her on prilosec and told her to follow up with her PCP.  Once her pain was under control, they discharged her home. She reports that this definitely felt different than Reflux. She reports that she did pick up the prilosec but would prefer to go back on protonix as she was on that in the past. All symptoms have now resolved. She does mention that her potassium has been low every time they checked it. She is on a diuretic for peripheral edema. She is on 10 meq of potassium. They told her in the ER that she needed to double her dose but they did not give her a RX for that. She request and RX today.  Review of Systems      Past Medical History  Diagnosis Date  . Allergy   . Anxiety   . Hx of adenomatous colonic polyps   . Thyroid disease     Hypothyroidism  . Lumbar disc disease   . Diverticulosis of colon   . Nephrolithiasis   . Hypotonic bladder     Hospitalized 06/2009 for UTI  . Chronic LBP 03/29/2011  . Obesity 03/29/2011  . History of renal stone 04/15/2011    Current Outpatient Prescriptions  Medication Sig Dispense Refill  . acetaminophen (TYLENOL) 500 MG tablet Take 500 mg by mouth every 6 (six) hours as needed for pain.       [name] albuterol (PROAIR HFA) 108 (90 BASE) MCG/ACT inhaler Inhale 2 puffs into the lungs every 4 (four) hours as needed for wheezing.  8.5 g  11  . ALPRAZolam (XANAX) 0.25 MG tablet Take 1 tablet (0.25 mg total) by mouth 3 (three) times daily as needed for anxiety. To fill jan 15,2014  35 tablet  0  .  calcium carbonate (OS-CAL) 600 MG TABS Take 600 mg by mouth daily.        . cephALEXin (KEFLEX) 500 MG capsule Take 1 capsule (500 mg total) by mouth 2 (two) times daily. Take all of medicine and drink lots of fluids  20 capsule  0  . fexofenadine-pseudoephedrine (ALLEGRA-D) 60-120 MG per tablet Take 1 tablet by mouth daily.       . fluticasone (FLONASE) 50 MCG/ACT nasal spray Place 2 sprays into the nose daily.  16 g  11  . furosemide (LASIX) 40 MG tablet Take 1 tablet (40 mg total) by mouth daily.  30 tablet  11  . levothyroxine (SYNTHROID, LEVOTHROID) 88 MCG tablet Take 1 tablet (88 mcg total) by mouth daily.  90 tablet  3  . Multiple Vitamin (MULTIVITAMIN) tablet Take 1 tablet by mouth daily.        [name] omeprazole (PRILOSEC) 20 MG capsule Take 1 capsule (20 mg total) by mouth daily.  30 capsule  0  . ondansetron (ZOFRAN) 4 MG tablet Take 8 mg by mouth every 8 (eight) hours as needed for nausea.      [name] oxyCODONE-acetaminophen (PERCOCET/ROXICET) 5-325 MG  per tablet Take 1 tablet by mouth every 6 (six) hours as needed for pain.      . potassium chloride (KLOR-CON M10) 10 MEQ tablet Take 1 tablet (10 mEq total) by mouth daily.  90 tablet  3  . traMADol (ULTRAM) 50 MG tablet Take 50 mg by mouth every 6 (six) hours as needed for pain.      . [DISCONTINUED] loratadine (CLARITIN) 10 MG tablet Take 10 mg by mouth daily.        No current facility-administered medications for this visit.    Allergies  Allergen Reactions  . Amoxicillin     REACTION: rash, sob - tol kelfex and rocephn hosp 06/2009  . Aspirin     REACTION: rash, sob  . Avelox (Moxifloxacin Hcl In Nacl) Hives  . Latex     Blisters on skin  . Moxifloxacin     REACTION: hives - but tol cipro w/o adv rxn  . Sulfonamide Derivatives     REACTION: ?rash, sob - but reports bactrim tolerence    Family History  Problem Relation Age of Onset  . Dementia Mother   . Colon cancer Mother 17  . Breast cancer Mother   . Dementia Father   .  Colon cancer Father 76  . Heart disease Neg Hx   . Stomach cancer Neg Hx   . Hypertension Other   . Cancer Other     History   Social History  . Marital Status: Married    Spouse Name: N/A    Number of Children: N/A  . Years of Education: N/A   Occupational History  . Not on file.   Social History Main Topics  . Smoking status: Former Smoker    Quit date: 09/13/1979  . Smokeless tobacco: Never Used  . Alcohol Use: No  . Drug Use: No  . Sexually Active: Not on file   Other Topics Concern  . Not on file   Social History Narrative   Pt is married with 3 children, one lives with her.     Constitutional: Denies fever, malaise, fatigue, headache or abrupt weight changes.  Respiratory: Denies difficulty breathing, shortness of breath, cough or sputum production.   Cardiovascular: Denies chest pain, chest tightness, palpitations or swelling in the hands or feet.  Gastrointestinal: Denies abdominal pain, bloating, constipation, diarrhea or blood in the stool.   No other specific complaints in a complete review of systems (except as listed in HPI above).  Objective:   Physical Exam   BP 114/66  Pulse 78  Temp(Src) 98.6 F (37 C) (Oral)  SpO2 98% Wt Readings from Last 3 Encounters:  08/09/12 187 lb (84.823 kg)  08/09/12 189 lb (85.73 kg)  03/29/12 188 lb 6 oz (85.446 kg)    General: Appears her stated age, obese but well developed, well nourished in NAD.  Neck: Normal range of motion. Neck supple, trachea midline. No massses, lumps or thyromegaly present.  Cardiovascular: Normal rate and rhythm. S1,S2 noted.  No murmur, rubs or gallops noted. No JVD or BLE edema. No carotid bruits noted. Pulmonary/Chest: Normal effort and positive vesicular breath sounds. No respiratory distress. No wheezes, rales or ronchi noted.  Abdomen: Soft and nontender. Normal bowel sounds, no bruits noted. No distention or masses noted. Liver, spleen and kidneys non palpable.         Assessment & Plan:

## 2012-08-14 NOTE — Assessment & Plan Note (Signed)
Finish the prilosec then start the protonix RX provided

## 2012-08-14 NOTE — Assessment & Plan Note (Signed)
Continue lasix Past labs reviewed as well as EKG, echo and CT scans performed in ER

## 2012-08-14 NOTE — Patient Instructions (Signed)
Chest Pain (Nonspecific) It is often hard to give a specific diagnosis for the cause of chest pain. There is always a chance that your pain could be related to something serious, such as a heart attack or a blood clot in the lungs. You need to follow up with your caregiver for further evaluation. CAUSES   Heartburn.  Pneumonia or bronchitis.  Anxiety or stress.  Inflammation around your heart (pericarditis) or lung (pleuritis or pleurisy).  A blood clot in the lung.  A collapsed lung (pneumothorax). It can develop suddenly on its own (spontaneous pneumothorax) or from injury (trauma) to the chest.  Shingles infection (herpes zoster virus). The chest wall is composed of bones, muscles, and cartilage. Any of these can be the source of the pain.  The bones can be bruised by injury.  The muscles or cartilage can be strained by coughing or overwork.  The cartilage can be affected by inflammation and become sore (costochondritis). DIAGNOSIS  Lab tests or other studies, such as X-rays, electrocardiography, stress testing, or cardiac imaging, may be needed to find the cause of your pain.  TREATMENT   Treatment depends on what may be causing your chest pain. Treatment may include:  Acid blockers for heartburn.  Anti-inflammatory medicine.  Pain medicine for inflammatory conditions.  Antibiotics if an infection is present.  You may be advised to change lifestyle habits. This includes stopping smoking and avoiding alcohol, caffeine, and chocolate.  You may be advised to keep your head raised (elevated) when sleeping. This reduces the chance of acid going backward from your stomach into your esophagus.  Most of the time, nonspecific chest pain will improve within 2 to 3 days with rest and mild pain medicine. HOME CARE INSTRUCTIONS   If antibiotics were prescribed, take your antibiotics as directed. Finish them even if you start to feel better.  For the next few days, avoid physical  activities that bring on chest pain. Continue physical activities as directed.  Do not smoke.  Avoid drinking alcohol.  Only take over-the-counter or prescription medicine for pain, discomfort, or fever as directed by your caregiver.  Follow your caregiver's suggestions for further testing if your chest pain does not go away.  Keep any follow-up appointments you made. If you do not go to an appointment, you could develop lasting (chronic) problems with pain. If there is any problem keeping an appointment, you must call to reschedule. SEEK MEDICAL CARE IF:   You think you are having problems from the medicine you are taking. Read your medicine instructions carefully.  Your chest pain does not go away, even after treatment.  You develop a rash with blisters on your chest. SEEK IMMEDIATE MEDICAL CARE IF:   You have increased chest pain or pain that spreads to your arm, neck, jaw, back, or abdomen.  You develop shortness of breath, an increasing cough, or you are coughing up blood.  You have severe back or abdominal pain, feel nauseous, or vomit.  You develop severe weakness, fainting, or chills.  You have a fever. THIS IS AN EMERGENCY. Do not wait to see if the pain will go away. Get medical help at once. Call your local emergency services (911 in [place] Do not drive yourself to the hospital. MAKE SURE YOU:   Understand these instructions.  Will watch your condition.  Will get help right away if you are not doing well or get worse. Document Released: 01/18/2005 Document Revised: 07/03/2011 Document Reviewed: 11/14/2007 Exit[employer] Patient Information 2013 [employer].  

## 2012-09-13 ENCOUNTER — Ambulatory Visit: Payer: 59 | Admitting: Internal Medicine

## 2012-09-23 ENCOUNTER — Ambulatory Visit: Payer: 59 | Admitting: Internal Medicine

## 2012-09-30 ENCOUNTER — Encounter: Payer: Self-pay | Admitting: Internal Medicine

## 2012-09-30 ENCOUNTER — Ambulatory Visit (INDEPENDENT_AMBULATORY_CARE_PROVIDER_SITE_OTHER): Payer: 59 | Admitting: Internal Medicine

## 2012-09-30 VITALS — BP 110/72 | HR 72 | Temp 97.6°F | Ht 63.0 in | Wt 190.2 lb

## 2012-09-30 DIAGNOSIS — R5381 Other malaise: Secondary | ICD-10-CM

## 2012-09-30 DIAGNOSIS — R7302 Impaired glucose tolerance (oral): Secondary | ICD-10-CM

## 2012-09-30 DIAGNOSIS — F32A Depression, unspecified: Secondary | ICD-10-CM

## 2012-09-30 DIAGNOSIS — F3289 Other specified depressive episodes: Secondary | ICD-10-CM

## 2012-09-30 DIAGNOSIS — R5383 Other fatigue: Secondary | ICD-10-CM

## 2012-09-30 DIAGNOSIS — R7309 Other abnormal glucose: Secondary | ICD-10-CM

## 2012-09-30 DIAGNOSIS — F329 Major depressive disorder, single episode, unspecified: Secondary | ICD-10-CM

## 2012-09-30 DIAGNOSIS — R609 Edema, unspecified: Secondary | ICD-10-CM

## 2012-09-30 DIAGNOSIS — Z0000 Encounter for general adult medical examination without abnormal findings: Secondary | ICD-10-CM

## 2012-09-30 NOTE — Patient Instructions (Addendum)
Please continue all other medications as before  Please keep legs elevated if sitting, no extra salt diet, and compression stockings every day  Please continue your efforts at being more active, low cholesterol diet, and weight control.  Thank you for enrolling in [facility]. Please follow the instructions below to securely access your online medical record. MyChart allows you to send messages to your doctor, view your test results, renew your prescriptions, schedule appointments, and more.  Please return in 6 months, or sooner if needed, with Lab testing done 3-5 days before

## 2012-09-30 NOTE — Progress Notes (Signed)
Subjective:    Patient ID: [name], female    DOB: 1955/11/19, 57 y.o.   MRN: [id number]  HPI    Here to f/u, still without signficant change in venous insufficiency with persistent swelling depsite current meds, Pt denies chest pain, increased sob or doe, wheezing, orthopnea, PND, increased LE swelling, palpitations, dizziness or syncope.  Pt denies new neurological symptoms such as new headache, or facial or extremity weakness or numbness   Pt denies polydipsia, polyuria. Wt stable. Hard to lose wt.  Denies worsening depressive symptoms, suicidal ideation, or panic.  Does c/o ongoing fatigue, but denies signficant daytime hypersomnolence.   Pt denies fever, wt loss, night sweats, loss of appetite, or other constitutional symptoms. Does not wear compression stockings every day.  Mar 2013 echo with normal EF Past Medical History  Diagnosis Date  . Allergy   . Anxiety   . Hx of adenomatous colonic polyps   . Thyroid disease     Hypothyroidism  . Lumbar disc disease   . Diverticulosis of colon   . Nephrolithiasis   . Hypotonic bladder     Hospitalized 06/2009 for UTI  . Chronic LBP 03/29/2011  . Obesity 03/29/2011  . History of renal stone 04/15/2011   Past Surgical History  Procedure Laterality Date  . Abdominal hysterectomy    . Cholecystectomy    . Ovarian cyst surgery    . Breast surgery  11/2007    Biopsy  . Cystoscopy  2008    reports that she quit smoking about 33 years ago. She has never used smokeless tobacco. She reports that she does not drink alcohol or use illicit drugs. family history includes Breast cancer in her mother; Cancer in her other; Colon cancer (age of onset: 65) in her mother; Colon cancer (age of onset: 42) in her father; Dementia in her father and mother; and Hypertension in her other.  There is no history of Heart disease and Stomach cancer. Allergies  Allergen Reactions  . Amoxicillin     REACTION: rash, sob - tol kelfex and rocephn hosp 06/2009  .  Aspirin     REACTION: rash, sob  . Avelox (Moxifloxacin Hcl In Nacl) Hives  . Latex     Blisters on skin  . Moxifloxacin     REACTION: hives - but tol cipro w/o adv rxn  . Sulfonamide Derivatives     REACTION: ?rash, sob - but reports bactrim tolerence   Current Outpatient Prescriptions on File Prior to Visit  Medication Sig Dispense Refill  . acetaminophen (TYLENOL) 500 MG tablet Take 500 mg by mouth every 6 (six) hours as needed for pain.       [name] albuterol (PROAIR HFA) 108 (90 BASE) MCG/ACT inhaler Inhale 2 puffs into the lungs every 4 (four) hours as needed for wheezing.  8.5 g  11  . ALPRAZolam (XANAX) 0.25 MG tablet Take 1 tablet (0.25 mg total) by mouth 3 (three) times daily as needed for anxiety. To fill jan 15,2014  35 tablet  0  . calcium carbonate (OS-CAL) 600 MG TABS Take 600 mg by mouth daily.        . fexofenadine-pseudoephedrine (ALLEGRA-D) 60-120 MG per tablet Take 1 tablet by mouth daily.       . fluticasone (FLONASE) 50 MCG/ACT nasal spray Place 2 sprays into the nose daily.  16 g  11  . furosemide (LASIX) 40 MG tablet Take 1 tablet (40 mg total) by mouth daily.  30 tablet  11  .  levothyroxine (SYNTHROID, LEVOTHROID) 88 MCG tablet Take 1 tablet (88 mcg total) by mouth daily.  90 tablet  3  . Multiple Vitamin (MULTIVITAMIN) tablet Take 1 tablet by mouth daily.        . nitroGLYCERIN (NITROSTAT) 0.4 MG SL tablet Place 1 tablet (0.4 mg total) under the tongue every 5 (five) minutes as needed for chest pain.  50 tablet  3  . ondansetron (ZOFRAN) 4 MG tablet Take 8 mg by mouth every 8 (eight) hours as needed for nausea.      . pantoprazole (PROTONIX) 40 MG tablet Take 1 tablet (40 mg total) by mouth daily.  30 tablet  3  . potassium chloride SA (K-DUR,KLOR-CON) 20 MEQ tablet Take 1 tablet (20 mEq total) by mouth daily.  30 tablet  3  . [DISCONTINUED] loratadine (CLARITIN) 10 MG tablet Take 10 mg by mouth daily.        No current facility-administered medications on file prior  to visit.   Review of Systems  Constitutional: Negative for unexpected weight change, or unusual diaphoresis  HENT: Negative for tinnitus.   Eyes: Negative for photophobia and visual disturbance.  Respiratory: Negative for choking and stridor.   Gastrointestinal: Negative for vomiting and blood in stool.  Genitourinary: Negative for hematuria and decreased urine volume.  Musculoskeletal: Negative for acute joint swelling Skin: Negative for color change and wound.  Neurological: Negative for tremors and numbness other than noted  Psychiatric/Behavioral: Negative for decreased concentration or  hyperactivity.       Objective:   Physical Exam BP 110/72  Pulse 72  Temp(Src) 97.6 F (36.4 C) (Oral)  Ht 5\' 3"  (1.6 m)  Wt 190 lb 4 oz (86.297 kg)  BMI 33.71 kg/m2  SpO2 96% VS noted,  Constitutional: Pt appears well-developed and well-nourished.  HENT: Head: NCAT.  Right Ear: External ear normal.  Left Ear: External ear normal.  Eyes: Conjunctivae and EOM are normal. Pupils are equal, round, and reactive to light.  Neck: Normal range of motion. Neck supple.  Cardiovascular: Normal rate and regular rhythm.   Pulmonary/Chest: Effort normal and breath sounds normal.  Abd:  Soft, NT, non-distended, + BS Neurological: Pt is alert. Not confused  Skin: Skin is warm. No erythema. LE's with trace to 1+ edema Psychiatric: Pt behavior is normal. Thought content normal.     Assessment & Plan:

## 2012-10-02 NOTE — Assessment & Plan Note (Signed)
stable overall by history and exam, recent data reviewed with pt, and pt to continue medical treatment as before,  to f/u any worsening symptoms or concerns Lab Results  Component Value Date   WBC 7.3 08/13/2012   HGB 14.3 08/13/2012   HCT 42.0 08/13/2012   PLT 220 08/13/2012   GLUCOSE 88 08/13/2012   CHOL 144 03/22/2012   TRIG 82.0 03/22/2012   HDL 50.60 03/22/2012   LDLCALC 77 03/22/2012   ALT 28 03/22/2012   AST 28 03/22/2012   NA 141 08/13/2012   K 4.3 08/13/2012   CL 104 08/13/2012   CREATININE 0.90 08/13/2012   BUN 15 08/13/2012   CO2 24 03/22/2012   TSH 1.78 03/22/2012   INR 0.94 01/27/2009   HGBA1C 5.5 04/17/2011

## 2012-10-02 NOTE — Assessment & Plan Note (Signed)
stable overall by history and exam, recent data reviewed with pt, and pt to continue medical treatment as before,  to f/u any worsening symptoms or concerns Lab Results  Component Value Date   HGBA1C 5.5 04/17/2011

## 2012-10-02 NOTE — Assessment & Plan Note (Signed)
Etiology unclear, Exam otherwise benign, to check labs as documented, follow with expectant management  

## 2012-10-02 NOTE — Assessment & Plan Note (Signed)
C/w venous insuff, no change in meds needed, for daily compression stockings, wt stable, Please continue all tx , reassured

## 2012-10-20 ENCOUNTER — Ambulatory Visit (INDEPENDENT_AMBULATORY_CARE_PROVIDER_SITE_OTHER): Payer: 59 | Admitting: Emergency Medicine

## 2012-10-20 VITALS — BP 99/64 | HR 78 | Temp 98.4°F | Resp 18 | Ht 64.0 in | Wt 185.0 lb

## 2012-10-20 DIAGNOSIS — J018 Other acute sinusitis: Secondary | ICD-10-CM

## 2012-10-20 MED ORDER — PSEUDOEPHEDRINE-GUAIFENESIN ER 60-600 MG PO TB12: 1.0000 | ORAL_TABLET | Freq: Two times a day (BID) | ORAL | Status: DC

## 2012-10-20 MED ORDER — CEFPROZIL 500 MG PO TABS: 500.0000 mg | ORAL_TABLET | Freq: Two times a day (BID) | ORAL | Status: DC

## 2012-10-20 MED ORDER — CEFPROZIL 500 MG PO TABS: 500.0000 mg | ORAL_TABLET | Freq: Two times a day (BID) | ORAL | Status: AC

## 2012-10-20 NOTE — Addendum Note (Signed)
Addended by: Roselee Culver on: 10/20/2012 12:01 PM   Modules accepted: Orders

## 2012-10-20 NOTE — Progress Notes (Signed)
[facility] [address], [place] [address] [phone]  Date:  10/20/2012   Name:  [name]   DOB:  1956/04/24   MRN:  [id number]  PCP:  Cathlean Cower, MD    Chief Complaint: Migraine, Nausea, Facial Pain, Nasal Congestion and Insect Bite   History of Present Illness:  [name] is a 57 y.o. very pleasant female patient who presents with the following:  Thursday developed facial pain and pressure and a headache.  Has a nasal drainage and post nasal drip that is purulent.  Has chill but no fever.  No cough, wheezing or shortness of breath.  Developed a frontal headache and a pressure in the back of her head not associated with photophobia or trouble with loud noise.  Has described the headache as a migraine but never apparently diagnosed with same.  No neuro or visual symptoms.  No improvement with over the counter medications or other home remedies. Denies other complaint or health concern today.   Patient Active Problem List   Diagnosis Date Noted  . GERD (gastroesophageal reflux disease) 08/14/2012  . Bilateral leg and foot pain 10/23/2011  . Depression 07/10/2011  . History of renal stone 04/15/2011  . Pleural effusion 04/14/2011  . Impaired glucose tolerance 04/14/2011  . Leukocytosis 04/14/2011  . Hypokalemia 04/14/2011  . Chronic LBP 03/29/2011  . Obesity 03/29/2011  . Preventative health care 12/30/2010  . THYROID NODULE, RIGHT 02/21/2010  . NECK PAIN, RIGHT 02/21/2010  . Edema 02/21/2010  . 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 FATIGUE 03/11/2009  . SINUSITIS, CHRONIC 11/27/2008  . HYPOTHYROIDISM 07/19/2007  . ANXIETY 07/19/2007  . ALLERGIC RHINITIS 07/19/2007  . DIVERTICULOSIS, COLON 07/19/2007  . DEGENERATIVE DISC DISEASE 07/19/2007  . COLONIC POLYPS, HX OF 07/19/2007    Past Medical History  Diagnosis Date  . Allergy   . Anxiety   . Hx of adenomatous colonic polyps   . Thyroid disease     Hypothyroidism  . Lumbar disc  disease   . Diverticulosis of colon   . Nephrolithiasis   . Hypotonic bladder     Hospitalized 06/2009 for UTI  . Chronic LBP 03/29/2011  . Obesity 03/29/2011  . History of renal stone 04/15/2011    Past Surgical History  Procedure Laterality Date  . Abdominal hysterectomy    . Cholecystectomy    . Ovarian cyst surgery    . Breast surgery  11/2007    Biopsy  . Cystoscopy  2008    History  Substance Use Topics  . Smoking status: Former Smoker    Quit date: 09/13/1979  . Smokeless tobacco: Never Used  . Alcohol Use: No    Family History  Problem Relation Age of Onset  . Dementia Mother   . Colon cancer Mother 20  . Breast cancer Mother   . Dementia Father   . Colon cancer Father 23  . Heart disease Neg Hx   . Stomach cancer Neg Hx   . Hypertension Other   . Cancer Other     Allergies  Allergen Reactions  . Amoxicillin     REACTION: rash, sob - tol kelfex and rocephn hosp 06/2009  . Aspirin     REACTION: rash, sob  . Avelox (Moxifloxacin Hcl In Nacl) Hives  . Latex     Blisters on skin  . Moxifloxacin     REACTION: hives - but tol cipro w/o adv rxn  . Sulfonamide Derivatives  REACTION: ?rash, sob - but reports bactrim tolerence    Medication list has been reviewed and updated.  Current Outpatient Prescriptions on File Prior to Visit  Medication Sig Dispense Refill  . acetaminophen (TYLENOL) 500 MG tablet Take 500 mg by mouth every 6 (six) hours as needed for pain.       [name] albuterol (PROAIR HFA) 108 (90 BASE) MCG/ACT inhaler Inhale 2 puffs into the lungs every 4 (four) hours as needed for wheezing.  8.5 g  11  . ALPRAZolam (XANAX) 0.25 MG tablet Take 1 tablet (0.25 mg total) by mouth 3 (three) times daily as needed for anxiety. To fill jan 15,2014  35 tablet  0  . calcium carbonate (OS-CAL) 600 MG TABS Take 600 mg by mouth daily.        . fexofenadine-pseudoephedrine (ALLEGRA-D) 60-120 MG per tablet Take 1 tablet by mouth daily.       . fluticasone  (FLONASE) 50 MCG/ACT nasal spray Place 2 sprays into the nose daily.  16 g  11  . furosemide (LASIX) 40 MG tablet Take 1 tablet (40 mg total) by mouth daily.  30 tablet  11  . levothyroxine (SYNTHROID, LEVOTHROID) 88 MCG tablet Take 1 tablet (88 mcg total) by mouth daily.  90 tablet  3  . Multiple Vitamin (MULTIVITAMIN) tablet Take 1 tablet by mouth daily.        . nitroGLYCERIN (NITROSTAT) 0.4 MG SL tablet Place 1 tablet (0.4 mg total) under the tongue every 5 (five) minutes as needed for chest pain.  50 tablet  3  . pantoprazole (PROTONIX) 40 MG tablet Take 1 tablet (40 mg total) by mouth daily.  30 tablet  3  . potassium chloride SA (K-DUR,KLOR-CON) 20 MEQ tablet Take 1 tablet (20 mEq total) by mouth daily.  30 tablet  3  . ondansetron (ZOFRAN) 4 MG tablet Take 8 mg by mouth every 8 (eight) hours as needed for nausea.      . [DISCONTINUED] loratadine (CLARITIN) 10 MG tablet Take 10 mg by mouth daily.        No current facility-administered medications on file prior to visit.    Review of Systems:  As per HPI, otherwise negative.    Physical Examination: Filed Vitals:   10/20/12 1121  BP: 99/64  Pulse: 78  Temp: 98.4 F (36.9 C)  Resp: 18   Filed Vitals:   10/20/12 1121  Height: 5\' 4"  (1.626 m)  Weight: 185 lb (83.915 kg)   Body mass index is 31.74 kg/(m^2). Ideal Body Weight: Weight in (lb) to have BMI = 25: 145.3  GEN: WDWN, NAD, Non-toxic, A & O x 3 HEENT: Atraumatic, Normocephalic. Neck supple. No masses, No LAD. Ears and Nose: No external deformity. CV: RRR, No M/G/R. No JVD. No thrill. No extra heart sounds. PULM: CTA B, no wheezes, crackles, rhonchi. No retractions. No resp. distress. No accessory muscle use. ABD: S, NT, ND, +BS. No rebound. No HSM. EXTR: No c/c/e NEURO Normal gait.  PSYCH: Normally interactive. Conversant. Not depressed or anxious appearing.  Calm demeanor.    Assessment and Plan: Sinusitis Headache augementin mucinexd   Tylenol   Signed,  Ellison Carwin, MD

## 2012-10-20 NOTE — Patient Instructions (Signed)

## 2012-10-26 ENCOUNTER — Emergency Department (HOSPITAL_COMMUNITY)
Admission: EM | Admit: 2012-10-26 | Discharge: 2012-10-26 | Disposition: A | Discharge status: Home / Self Care | Payer: 59 | Source: Home / Self Care | Attending: Emergency Medicine | Admitting: Emergency Medicine

## 2012-10-26 ENCOUNTER — Encounter (HOSPITAL_COMMUNITY): Payer: Self-pay | Admitting: Emergency Medicine

## 2012-10-26 DIAGNOSIS — J019 Acute sinusitis, unspecified: Secondary | ICD-10-CM

## 2012-10-26 MED ORDER — KETOROLAC TROMETHAMINE 60 MG/2ML IM SOLN
INTRAMUSCULAR | Status: AC
  Filled 2012-10-26: qty 2

## 2012-10-26 MED ORDER — DEXAMETHASONE SODIUM PHOSPHATE 10 MG/ML IJ SOLN
10.0000 mg | Freq: Once | INTRAMUSCULAR | Status: AC
  Administered 2012-10-26: 10 mg via INTRAMUSCULAR

## 2012-10-26 MED ORDER — KETOROLAC TROMETHAMINE 60 MG/2ML IM SOLN
60.0000 mg | Freq: Once | INTRAMUSCULAR | Status: AC
  Administered 2012-10-26: 60 mg via INTRAMUSCULAR

## 2012-10-26 MED ORDER — CLARITHROMYCIN ER 500 MG PO TB24: 1000.0000 mg | ORAL_TABLET | Freq: Every day | ORAL | Status: DC

## 2012-10-26 MED ORDER — METHYLPREDNISOLONE 4 MG PO KIT: PACK | ORAL | Status: DC

## 2012-10-26 MED ORDER — DEXAMETHASONE SODIUM PHOSPHATE 10 MG/ML IJ SOLN
INTRAMUSCULAR | Status: AC
  Filled 2012-10-26: qty 1

## 2012-10-26 NOTE — ED Notes (Signed)
Pt given injections will discharge at 6:05 p.m

## 2012-10-26 NOTE — ED Provider Notes (Signed)
Medical screening examination/treatment/procedure(s) were performed by non-physician practitioner and as supervising physician I was immediately available for consultation/collaboration.  Philipp Deputy, M.D.  Harden Mo, MD 10/26/12 [phone]

## 2012-10-26 NOTE — ED Provider Notes (Signed)
History    CSN: [id number] Arrival date & time 10/26/12  1623  First MD Initiated Contact with Patient 10/26/12 1658     Chief Complaint  Patient presents with  . Facial Pain    sinus pressure and pain. recently seen on sunday at a different urgent care for same symptoms.    (Consider location/radiation/quality/duration/timing/severity/associated sxs/prior Treatment) HPI Comments: Sinus pressure around eyes, failed cefprozil.  Also using flonase, mucinex d, ibuprofen.    57 year old female presents complaining of sinus pressure around her eyes and forehead bilaterally. This is been going on total for about 2 weeks. After a few days, she went to a different urgent care and was prescribed cefprozil twice a day for 10 days. She just finished this but she says her symptoms have been getting worse the entire time she has been taking it. She was also told to take Mucinex D and ibuprofen as needed which she has been doing. She has pain and pressure in the sinuses behind both cheeks and in the forehead. She also has sore throat and pressure in her ears as well as some mild nausea without any vomiting or diarrhea. She denies fever, chills, cough, shortness of breath, or any symptoms below the level of the throat apart from the nausea. She states that she gets sinus infections a few times every year and it always gets better with azithromycin.  Past Medical History  Diagnosis Date  . Allergy   . Anxiety   . Hx of adenomatous colonic polyps   . Thyroid disease     Hypothyroidism  . Lumbar disc disease   . Diverticulosis of colon   . Nephrolithiasis   . Hypotonic bladder     Hospitalized 06/2009 for UTI  . Chronic LBP 03/29/2011  . Obesity 03/29/2011  . History of renal stone 04/15/2011   Past Surgical History  Procedure Laterality Date  . Abdominal hysterectomy    . Cholecystectomy    . Ovarian cyst surgery    . Breast surgery  11/2007    Biopsy  . Cystoscopy  2008   Family History   Problem Relation Age of Onset  . Dementia Mother   . Colon cancer Mother 30  . Breast cancer Mother   . Dementia Father   . Colon cancer Father 85  . Heart disease Neg Hx   . Stomach cancer Neg Hx   . Hypertension Other   . Cancer Other    History  Substance Use Topics  . Smoking status: Former Smoker    Quit date: 09/13/1979  . Smokeless tobacco: Never Used  . Alcohol Use: No   OB History   Grav Para Term Preterm Abortions TAB SAB Ect Mult Living                 Review of Systems  Constitutional: Negative for fever and chills.  HENT: Positive for ear pain, congestion, sore throat and sinus pressure. Negative for hearing loss, nosebleeds, facial swelling, rhinorrhea, sneezing, trouble swallowing, neck pain, dental problem, voice change, postnasal drip and ear discharge.   Eyes: Negative for visual disturbance.  Respiratory: Negative for cough and shortness of breath.   Cardiovascular: Negative for chest pain, palpitations and leg swelling.  Gastrointestinal: Positive for nausea. Negative for vomiting, abdominal pain, diarrhea and constipation.  Endocrine: Negative for polydipsia and polyuria.  Genitourinary: Negative for dysuria, urgency and frequency.  Musculoskeletal: Negative for myalgias and arthralgias.  Skin: Negative for rash.  Neurological: Negative for dizziness, weakness and  light-headedness.    Allergies  Amoxicillin; Aspirin; Avelox; Latex; Moxifloxacin; and Sulfonamide derivatives  Home Medications   Current Outpatient Rx  Name  Route  Sig  Dispense  Refill  . acetaminophen (TYLENOL) 500 MG tablet   Oral   Take 500 mg by mouth every 6 (six) hours as needed for pain.          [name] ALPRAZolam (XANAX) 0.25 MG tablet   Oral   Take 1 tablet (0.25 mg total) by mouth 3 (three) times daily as needed for anxiety. To fill jan 15,2014   35 tablet   0   . fexofenadine-pseudoephedrine (ALLEGRA-D) 60-120 MG per tablet   Oral   Take 1 tablet by mouth daily.           . furosemide (LASIX) 40 MG tablet   Oral   Take 1 tablet (40 mg total) by mouth daily.   30 tablet   11   . levothyroxine (SYNTHROID, LEVOTHROID) 88 MCG tablet   Oral   Take 1 tablet (88 mcg total) by mouth daily.   90 tablet   3   . pantoprazole (PROTONIX) 40 MG tablet   Oral   Take 1 tablet (40 mg total) by mouth daily.   30 tablet   3   . potassium chloride SA (K-DUR,KLOR-CON) 20 MEQ tablet   Oral   Take 1 tablet (20 mEq total) by mouth daily.   30 tablet   3   . pseudoephedrine-guaifenesin (MUCINEX D) 60-600 MG per tablet   Oral   Take 1 tablet by mouth every 12 (twelve) hours.   18 tablet   0   . albuterol (PROAIR HFA) 108 (90 BASE) MCG/ACT inhaler   Inhalation   Inhale 2 puffs into the lungs every 4 (four) hours as needed for wheezing.   8.5 g   11   . calcium carbonate (OS-CAL) 600 MG TABS   Oral   Take 600 mg by mouth daily.           . cefPROZIL (CEFZIL) 500 MG tablet   Oral   Take 1 tablet (500 mg total) by mouth 2 (two) times daily.   20 tablet   0   . clarithromycin (BIAXIN XL) 500 MG 24 hr tablet   Oral   Take 2 tablets (1,000 mg total) by mouth daily.   20 tablet   0   . fluticasone (FLONASE) 50 MCG/ACT nasal spray   Nasal   Place 2 sprays into the nose daily.   16 g   11   . methylPREDNISolone (MEDROL DOSEPAK) 4 MG tablet      Use as directed   21 tablet   0   . Multiple Vitamin (MULTIVITAMIN) tablet   Oral   Take 1 tablet by mouth daily.           . nitroGLYCERIN (NITROSTAT) 0.4 MG SL tablet   Sublingual   Place 1 tablet (0.4 mg total) under the tongue every 5 (five) minutes as needed for chest pain.   50 tablet   3   . ondansetron (ZOFRAN) 4 MG tablet   Oral   Take 8 mg by mouth every 8 (eight) hours as needed for nausea.          BP 138/81  Pulse 84  Temp(Src) 98.1 F (36.7 C) (Oral)  Resp 20  SpO2 99% Physical Exam  Nursing note and vitals reviewed. Constitutional: She is oriented to person,  place, and time. Vital  signs are normal. She appears well-developed and well-nourished. No distress.  HENT:  Head: Normocephalic and atraumatic.  Nose: Right sinus exhibits maxillary sinus tenderness and frontal sinus tenderness. Left sinus exhibits maxillary sinus tenderness and frontal sinus tenderness.  Mouth/Throat: Uvula is midline and oropharynx is clear and moist. No oropharyngeal exudate, posterior oropharyngeal edema or posterior oropharyngeal erythema.  Eyes: EOM are normal. Pupils are equal, round, and reactive to light.  Cardiovascular: Normal rate, regular rhythm and normal heart sounds.  Exam reveals no gallop and no friction rub.   No murmur heard. Pulmonary/Chest: Effort normal and breath sounds normal. No respiratory distress. She has no wheezes. She has no rales.  Abdominal: Soft. Bowel sounds are normal. There is no hepatosplenomegaly. There is no tenderness. There is no CVA tenderness.  Neurological: She is alert and oriented to person, place, and time. She has normal strength.  Skin: Skin is warm and dry. She is not diaphoretic.  Psychiatric: She has a normal mood and affect. Her behavior is normal. Judgment normal.    ED Course  Procedures (including critical care time) Labs Reviewed - No data to display No results found. 1. Acute sinusitis treated with antibiotics in the past 60 days     MDM  Patient is given a IM injection of Decadron and Toradol here. Will prescribe Biaxin for 10 days and a tapering steroid dose pack. She will continue her Flonase, Mucinex D, and when necessary ibuprofen. She will followup again in 4 days if she does not improve.   Meds ordered this encounter  Medications  . dexamethasone (DECADRON) injection 10 mg    Sig:   . ketorolac (TORADOL) injection 60 mg    Sig:   . clarithromycin (BIAXIN XL) 500 MG 24 hr tablet    Sig: Take 2 tablets (1,000 mg total) by mouth daily.    Dispense:  20 tablet    Refill:  0  . methylPREDNISolone  (MEDROL DOSEPAK) 4 MG tablet    Sig: Use as directed    Dispense:  21 tablet    Refill:  0     Liam Graham, PA-C 10/26/12 1838

## 2012-10-26 NOTE — ED Notes (Signed)
Pt is currently taking ceprozil and mucinex that was prescribed to her on Sunday.

## 2012-10-26 NOTE — ED Notes (Signed)
Pt reports sinus pressure and pain with mild nausea. Bilateral ear pain.  Pt was recently seen at another urgent care for same symptoms.  Pt states that migraine symptoms are relieved but unable to get relief from sinus symptoms.

## 2012-11-15 ENCOUNTER — Ambulatory Visit (INDEPENDENT_AMBULATORY_CARE_PROVIDER_SITE_OTHER): Payer: 59 | Admitting: Emergency Medicine

## 2012-11-15 VITALS — BP 120/80 | HR 85 | Temp 98.5°F | Resp 16 | Ht 63.5 in | Wt 193.0 lb

## 2012-11-15 DIAGNOSIS — M5432 Sciatica, left side: Secondary | ICD-10-CM

## 2012-11-15 DIAGNOSIS — M5430 Sciatica, unspecified side: Secondary | ICD-10-CM

## 2012-11-15 MED ORDER — OXYCODONE-ACETAMINOPHEN 5-325 MG PO TABS: 1.0000 | ORAL_TABLET | ORAL | Status: DC | PRN

## 2012-11-15 MED ORDER — CYCLOBENZAPRINE HCL 10 MG PO TABS: 10.0000 mg | ORAL_TABLET | Freq: Three times a day (TID) | ORAL | Status: DC | PRN

## 2012-11-15 NOTE — Patient Instructions (Signed)
Sciatica °Sciatica is pain, weakness, numbness, or tingling along the path of the sciatic nerve. The nerve starts in the lower back and runs down the back of each leg. The nerve controls the muscles in the lower leg and in the back of the knee, while also providing sensation to the back of the thigh, lower leg, and the sole of your foot. Sciatica is a symptom of another medical condition. For instance, nerve damage or certain conditions, such as a herniated disk or bone spur on the spine, pinch or put pressure on the sciatic nerve. This causes the pain, weakness, or other sensations normally associated with sciatica. Generally, sciatica only affects one side of the body. °CAUSES  °· Herniated or slipped disc. °· Degenerative disk disease. °· A pain disorder involving the narrow muscle in the buttocks (piriformis syndrome). °· Pelvic injury or fracture. °· Pregnancy. °· Tumor (rare). °SYMPTOMS  °Symptoms can vary from mild to very severe. The symptoms usually travel from the low back to the buttocks and down the back of the leg. Symptoms can include: °· Mild tingling or dull aches in the lower back, leg, or hip. °· Numbness in the back of the calf or sole of the foot. °· Burning sensations in the lower back, leg, or hip. °· Sharp pains in the lower back, leg, or hip. °· Leg weakness. °· Severe back pain inhibiting movement. °These symptoms may get worse with coughing, sneezing, laughing, or prolonged sitting or standing. Also, being overweight may worsen symptoms. °DIAGNOSIS  °Your caregiver will perform a physical exam to look for common symptoms of sciatica. He or she may ask you to do certain movements or activities that would trigger sciatic nerve pain. Other tests may be performed to find the cause of the sciatica. These may include: °· Blood tests. °· X-rays. °· Imaging tests, such as an MRI or CT scan. °TREATMENT  °Treatment is directed at the cause of the sciatic pain. Sometimes, treatment is not necessary  and the pain and discomfort goes away on its own. If treatment is needed, your caregiver may suggest: °· Over-the-counter medicines to relieve pain. °· Prescription medicines, such as anti-inflammatory medicine, muscle relaxants, or narcotics. °· Applying heat or ice to the painful area. °· Steroid injections to lessen pain, irritation, and inflammation around the nerve. °· Reducing activity during periods of pain. °· Exercising and stretching to strengthen your abdomen and improve flexibility of your spine. Your caregiver may suggest losing weight if the extra weight makes the back pain worse. °· Physical therapy. °· Surgery to eliminate what is pressing or pinching the nerve, such as a bone spur or part of a herniated disk. °HOME CARE INSTRUCTIONS  °· Only take over-the-counter or prescription medicines for pain or discomfort as directed by your caregiver. °· Apply ice to the affected area for 20 minutes, 3 4 times a day for the first 48 72 hours. Then try heat in the same way. °· Exercise, stretch, or perform your usual activities if these do not aggravate your pain. °· Attend physical therapy sessions as directed by your caregiver. °· Keep all follow-up appointments as directed by your caregiver. °· Do not wear high heels or shoes that do not provide proper support. °· Check your mattress to see if it is too soft. A firm mattress may lessen your pain and discomfort. °SEEK IMMEDIATE MEDICAL CARE IF:  °· You lose control of your bowel or bladder (incontinence). °· You have increasing weakness in the lower back,   pelvis, buttocks, or legs. °· You have redness or swelling of your back. °· You have a burning sensation when you urinate. °· You have pain that gets worse when you lie down or awakens you at night. °· Your pain is worse than you have experienced in the past. °· Your pain is lasting longer than 4 weeks. °· You are suddenly losing weight without reason. °MAKE SURE YOU: °· Understand these  instructions. °· Will watch your condition. °· Will get help right away if you are not doing well or get worse. °Document Released: 04/04/2001 Document Revised: 10/10/2011 Document Reviewed: 08/20/2011 °[employer]® Patient Information ©2014 [employer]. ° °

## 2012-11-15 NOTE — Progress Notes (Signed)
[facility] [address], [place] [address] [phone]  Date:  11/15/2012   Name:  [name]   DOB:  10-11-1955   MRN:  [id number]  PCP:  Cathlean Cower, MD    Chief Complaint: Back Pain   History of Present Illness:  [name] is a 57 y.o. very pleasant female patient who presents with the following:  Pain in her low back on the left side.  Seems to be increasing since early July.  No history of injury.  Says has an "unstable" back and has long term pain.  Now pain into her left leg and posterior calf.  Worse when she lays down or sits.  Pain less with standing.  Weakness in left leg with standing.  No numbness.  Has experienced 4 epidural steroids in the past.  Variable results.  Has been taking tramadol, tylenol and percocet with little improvement in her pain.  Doesn't like to take medications.  No improvement with over the counter medications or other home remedies. Denies other complaint or health concern today.   Patient Active Problem List   Diagnosis Date Noted  . GERD (gastroesophageal reflux disease) 08/14/2012  . Bilateral leg and foot pain 10/23/2011  . Depression 07/10/2011  . History of renal stone 04/15/2011  . Pleural effusion 04/14/2011  . Impaired glucose tolerance 04/14/2011  . Leukocytosis 04/14/2011  . Hypokalemia 04/14/2011  . Chronic LBP 03/29/2011  . Obesity 03/29/2011  . Preventative health care 12/30/2010  . THYROID NODULE, RIGHT 02/21/2010  . NECK PAIN, RIGHT 02/21/2010  . Edema 02/21/2010  . 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 FATIGUE 03/11/2009  . SINUSITIS, CHRONIC 11/27/2008  . HYPOTHYROIDISM 07/19/2007  . ANXIETY 07/19/2007  . ALLERGIC RHINITIS 07/19/2007  . DIVERTICULOSIS, COLON 07/19/2007  . DEGENERATIVE DISC DISEASE 07/19/2007  . COLONIC POLYPS, HX OF 07/19/2007    Past Medical History  Diagnosis Date  . Allergy   . Anxiety   . Hx of adenomatous colonic polyps   . Thyroid disease     Hypothyroidism   . Lumbar disc disease   . Diverticulosis of colon   . Nephrolithiasis   . Hypotonic bladder     Hospitalized 06/2009 for UTI  . Chronic LBP 03/29/2011  . Obesity 03/29/2011  . History of renal stone 04/15/2011    Past Surgical History  Procedure Laterality Date  . Abdominal hysterectomy    . Cholecystectomy    . Ovarian cyst surgery    . Breast surgery  11/2007    Biopsy  . Cystoscopy  2008    History  Substance Use Topics  . Smoking status: Former Smoker    Quit date: 09/13/1979  . Smokeless tobacco: Never Used  . Alcohol Use: No    Family History  Problem Relation Age of Onset  . Dementia Mother   . Colon cancer Mother 68  . Breast cancer Mother   . Dementia Father   . Colon cancer Father 73  . Heart disease Neg Hx   . Stomach cancer Neg Hx   . Hypertension Other   . Cancer Other     Allergies  Allergen Reactions  . Amoxicillin     REACTION: rash, sob - tol kelfex and rocephn hosp 06/2009  . Aspirin     REACTION: rash, sob  . Avelox (Moxifloxacin Hcl In Nacl) Hives  . Latex     Blisters on skin  . Moxifloxacin     REACTION: hives -  but tol cipro w/o adv rxn  . Sulfonamide Derivatives     REACTION: ?rash, sob - but reports bactrim tolerence    Medication list has been reviewed and updated.  Current Outpatient Prescriptions on File Prior to Visit  Medication Sig Dispense Refill  . acetaminophen (TYLENOL) 500 MG tablet Take 500 mg by mouth every 6 (six) hours as needed for pain.       [name] albuterol (PROAIR HFA) 108 (90 BASE) MCG/ACT inhaler Inhale 2 puffs into the lungs every 4 (four) hours as needed for wheezing.  8.5 g  11  . ALPRAZolam (XANAX) 0.25 MG tablet Take 1 tablet (0.25 mg total) by mouth 3 (three) times daily as needed for anxiety. To fill jan 15,2014  35 tablet  0  . calcium carbonate (OS-CAL) 600 MG TABS Take 600 mg by mouth daily.        . clarithromycin (BIAXIN XL) 500 MG 24 hr tablet Take 2 tablets (1,000 mg total) by mouth daily.  20  tablet  0  . fexofenadine-pseudoephedrine (ALLEGRA-D) 60-120 MG per tablet Take 1 tablet by mouth daily.       . fluticasone (FLONASE) 50 MCG/ACT nasal spray Place 2 sprays into the nose daily.  16 g  11  . furosemide (LASIX) 40 MG tablet Take 1 tablet (40 mg total) by mouth daily.  30 tablet  11  . levothyroxine (SYNTHROID, LEVOTHROID) 88 MCG tablet Take 1 tablet (88 mcg total) by mouth daily.  90 tablet  3  . methylPREDNISolone (MEDROL DOSEPAK) 4 MG tablet Use as directed  21 tablet  0  . Multiple Vitamin (MULTIVITAMIN) tablet Take 1 tablet by mouth daily.        . nitroGLYCERIN (NITROSTAT) 0.4 MG SL tablet Place 1 tablet (0.4 mg total) under the tongue every 5 (five) minutes as needed for chest pain.  50 tablet  3  . ondansetron (ZOFRAN) 4 MG tablet Take 8 mg by mouth every 8 (eight) hours as needed for nausea.      . potassium chloride SA (K-DUR,KLOR-CON) 20 MEQ tablet Take 1 tablet (20 mEq total) by mouth daily.  30 tablet  3  . pseudoephedrine-guaifenesin (MUCINEX D) 60-600 MG per tablet Take 1 tablet by mouth every 12 (twelve) hours.  18 tablet  0  . pantoprazole (PROTONIX) 40 MG tablet Take 1 tablet (40 mg total) by mouth daily.  30 tablet  3  . [DISCONTINUED] loratadine (CLARITIN) 10 MG tablet Take 10 mg by mouth daily.        No current facility-administered medications on file prior to visit.    Review of Systems:  As per HPI, otherwise negative.    Physical Examination: Filed Vitals:   11/15/12 1657  BP: 120/80  Pulse: 85  Temp: 98.5 F (36.9 C)  Resp: 16   Filed Vitals:   11/15/12 1657  Height: 5' 3.5" (1.613 m)  Weight: 193 lb (87.544 kg)   Body mass index is 33.65 kg/(m^2). Ideal Body Weight: Weight in (lb) to have BMI = 25: 143.1   GEN: WDWN, moderate distress, Non-toxic, Alert & Oriented x 3 HEENT: Atraumatic, Normocephalic.  Ears and Nose: No external deformity. EXTR: No clubbing/cyanosis/edema NEURO: Normal gait.  PSYCH: Normally interactive.  Conversant. Not depressed or anxious appearing.  Calm demeanor.  BACK: tenderness left lumbar paraspinous muscles.  Tender left sciatic notch.  Gross motor intact.  Reflexes diminished bilaterally.  Assessment and Plan: Sciatic neuritis Percocet Flexeril Anaprox MRI   Signed,  Jacqulynn Cadet  Ouida Sills, MD

## 2012-11-20 ENCOUNTER — Ambulatory Visit (HOSPITAL_COMMUNITY)
Admission: RE | Admit: 2012-11-20 | Discharge: 2012-11-20 | Disposition: A | Discharge status: Home / Self Care | Payer: 59 | Source: Ambulatory Visit | Attending: Emergency Medicine | Admitting: Emergency Medicine

## 2012-11-20 DIAGNOSIS — M5432 Sciatica, left side: Secondary | ICD-10-CM

## 2012-11-20 DIAGNOSIS — M5137 Other intervertebral disc degeneration, lumbosacral region: Secondary | ICD-10-CM | POA: Insufficient documentation

## 2012-11-20 DIAGNOSIS — M51379 Other intervertebral disc degeneration, lumbosacral region without mention of lumbar back pain or lower extremity pain: Secondary | ICD-10-CM | POA: Insufficient documentation

## 2012-11-20 DIAGNOSIS — M5126 Other intervertebral disc displacement, lumbar region: Secondary | ICD-10-CM | POA: Insufficient documentation

## 2012-11-20 NOTE — Addendum Note (Signed)
Addended by: Roselee Culver on: 11/20/2012 07:58 PM   Modules accepted: Orders

## 2012-11-21 ENCOUNTER — Telehealth: Payer: Self-pay

## 2012-11-21 NOTE — Telephone Encounter (Signed)
Please review

## 2012-11-21 NOTE — Telephone Encounter (Signed)
I asked yesterday that someone call her with her result.  She has been referred to neurosurgery

## 2012-11-21 NOTE — Telephone Encounter (Signed)
Pt would like to know MRI results asap. Best# [phone]

## 2012-11-22 ENCOUNTER — Telehealth: Payer: Self-pay | Admitting: Radiology

## 2012-11-22 NOTE — Telephone Encounter (Signed)
Spoke with [name] since there was no documentation of what to inform the pt as to why the referral was being placed in the computer. We informed the pt that she does have disc narrowing and disc protrusion on the left at L5-S1, and with her pain located on the Left side the referral to a neurosurgeon would be best for her. With the report and her pain this could be a possible explanation for her nerve irritation. She stated she has already set up an appt with orthopedist today and would like the MRI Results to be sent there. Also, she wanted to cancel the referral for a neurosurgeon.  I sent the fax over to Dr Nelva Bush and called referrals to cx referral.  Pt called back and states that the orthopedist feels that she should see a neurosurgeon based upon her MRI Report. So she now wants [place] to place a referral for her to be seen urgently. She states her pain is getting worse and is unbearable. I told her I am not sure if they will be able to work her in today but I can inform Referrals of the situation. Her best number to reach is her cell.

## 2012-11-22 NOTE — Telephone Encounter (Signed)
Patient calling back, she previously wanted to cancel the referral but now she wants the appt ASAP. I have called patient to advise referral has been sent, we did not cancel this. I have called her and provided her the number for the Neurosurgeons office so they advise her what is available for her.

## 2012-12-09 ENCOUNTER — Other Ambulatory Visit: Payer: Self-pay | Admitting: Neurosurgery

## 2012-12-09 DIAGNOSIS — M5416 Radiculopathy, lumbar region: Secondary | ICD-10-CM

## 2012-12-10 ENCOUNTER — Ambulatory Visit
Admission: RE | Admit: 2012-12-10 | Discharge: 2012-12-10 | Disposition: A | Discharge status: Home / Self Care | Payer: 59 | Source: Ambulatory Visit | Attending: Neurosurgery | Admitting: Neurosurgery

## 2012-12-10 VITALS — BP 95/58 | HR 72

## 2012-12-10 DIAGNOSIS — IMO0002 Reserved for concepts with insufficient information to code with codable children: Secondary | ICD-10-CM

## 2012-12-10 DIAGNOSIS — M5416 Radiculopathy, lumbar region: Secondary | ICD-10-CM

## 2012-12-10 DIAGNOSIS — M545 Low back pain, unspecified: Secondary | ICD-10-CM

## 2012-12-10 DIAGNOSIS — G8929 Other chronic pain: Secondary | ICD-10-CM

## 2012-12-10 MED ORDER — IOHEXOL 180 MG/ML  SOLN
20.0000 mL | Freq: Once | INTRAMUSCULAR | Status: AC | PRN
  Administered 2012-12-10: 20 mL via INTRATHECAL

## 2012-12-10 MED ORDER — MEPERIDINE HCL 100 MG/ML IJ SOLN
100.0000 mg | Freq: Once | INTRAMUSCULAR | Status: AC
  Administered 2012-12-10: 100 mg via INTRAMUSCULAR

## 2012-12-10 MED ORDER — DIAZEPAM 5 MG PO TABS
10.0000 mg | ORAL_TABLET | Freq: Once | ORAL | Status: AC
  Administered 2012-12-10: 10 mg via ORAL

## 2012-12-10 MED ORDER — ONDANSETRON HCL 4 MG/2ML IJ SOLN
4.0000 mg | Freq: Once | INTRAMUSCULAR | Status: AC
  Administered 2012-12-10: 4 mg via INTRAMUSCULAR

## 2012-12-13 ENCOUNTER — Other Ambulatory Visit: Payer: Self-pay | Admitting: Neurosurgery

## 2012-12-13 ENCOUNTER — Telehealth: Payer: Self-pay | Admitting: Radiology

## 2012-12-13 ENCOUNTER — Ambulatory Visit
Admission: RE | Admit: 2012-12-13 | Discharge: 2012-12-13 | Disposition: A | Discharge status: Home / Self Care | Payer: 59 | Source: Ambulatory Visit | Attending: Neurosurgery | Admitting: Neurosurgery

## 2012-12-13 DIAGNOSIS — G971 Other reaction to spinal and lumbar puncture: Secondary | ICD-10-CM

## 2012-12-13 MED ORDER — IOHEXOL 180 MG/ML  SOLN: 1.0000 mL | Freq: Once | INTRAMUSCULAR | Status: AC | PRN

## 2012-12-13 NOTE — Telephone Encounter (Signed)
Got up to go to work today and developed severe headache and vomited after eating cereal. Myelo was 12/10/12. Will wait to have blood patch on Monday. Will call Dr. Joya Salm for blood patch order.

## 2012-12-13 NOTE — Progress Notes (Signed)
20cc blood drawn from right AC space for Epidural Blood Patch; site unremarkable.

## 2012-12-16 ENCOUNTER — Encounter (HOSPITAL_COMMUNITY): Payer: Self-pay | Admitting: Pharmacy Technician

## 2012-12-16 NOTE — Pre-Procedure Instructions (Signed)
[name]  12/16/2012   Your procedure is scheduled on:  Wednesday, August 27th.  Report to [facility] at 6:30AM.  Call this number if you have problems the morning of surgery: [phone]   Remember:   Do not eat food or drink liquids after midnight.   Take these medicines the morning of surgery with A SIP OF WATER: Levothyroxine (Synthyroid).  Pantoprazole (Prilosec).  Use nasal spray.  Use if needed: Albuterol and bring inhaler to the hospital with you.  May take Nitrostat, Zofran Tylenol, Oxycodone if needed.    Do not wear jewelry, make-up or nail polish.  Do not wear lotions, powders, or perfumes. You may wear deodorant.  Do not shave 48 hours prior to surgery. Men may shave face and neck.  Do not bring valuables to the hospital.  [facility] is not responsible for any belongings or valuables.  Contacts, dentures or bridgework may not be worn into surgery.  Leave suitcase in the car. After surgery it may be brought to your room.  For patients admitted to the hospital, checkout time is 11:00 AM the day of discharge.   Patients discharged the day of surgery will not be allowed to drive home.  Name and phone number of your driver: -   Special Instructions: Shower with CHG wash (Bactoshield) tonight and again in the am prior to arriving to hospital.   Please read over the following fact sheets that you were given: Pain Booklet, Coughing and Deep Breathing and Surgical Site Infection Prevention

## 2012-12-17 ENCOUNTER — Ambulatory Visit (HOSPITAL_COMMUNITY)
Admission: RE | Admit: 2012-12-17 | Discharge: 2012-12-17 | Disposition: A | Discharge status: Home / Self Care | Payer: 59 | Source: Ambulatory Visit | Attending: Anesthesiology | Admitting: Anesthesiology

## 2012-12-17 ENCOUNTER — Encounter (HOSPITAL_COMMUNITY): Payer: Self-pay

## 2012-12-17 ENCOUNTER — Encounter (HOSPITAL_COMMUNITY)
Admission: RE | Admit: 2012-12-17 | Discharge: 2012-12-17 | Disposition: A | Discharge status: Home / Self Care | Payer: 59 | Source: Ambulatory Visit | Attending: Neurosurgery | Admitting: Neurosurgery

## 2012-12-17 DIAGNOSIS — Z01818 Encounter for other preprocedural examination: Secondary | ICD-10-CM | POA: Insufficient documentation

## 2012-12-17 DIAGNOSIS — Z01812 Encounter for preprocedural laboratory examination: Secondary | ICD-10-CM | POA: Insufficient documentation

## 2012-12-17 HISTORY — DX: Other specified postprocedural states: Z98.890

## 2012-12-17 HISTORY — DX: Shortness of breath: R06.02

## 2012-12-17 HISTORY — DX: Concussion with loss of consciousness of unspecified duration, initial encounter: S06.0X9A

## 2012-12-17 HISTORY — DX: Family history of other specified conditions: Z84.89

## 2012-12-17 HISTORY — DX: Gastro-esophageal reflux disease without esophagitis: K21.9

## 2012-12-17 HISTORY — DX: Hypothyroidism, unspecified: E03.9

## 2012-12-17 HISTORY — DX: Adverse effect of unspecified anesthetic, initial encounter: T41.45XA

## 2012-12-17 HISTORY — DX: Nausea with vomiting, unspecified: R11.2

## 2012-12-17 HISTORY — DX: Unspecified asthma, uncomplicated: J45.909

## 2012-12-17 HISTORY — DX: Asymptomatic varicose veins of unspecified lower extremity: I83.90

## 2012-12-17 LAB — SURGICAL PCR SCREEN
MRSA, PCR: NEGATIVE
Staphylococcus aureus: NEGATIVE

## 2012-12-17 LAB — CBC
HCT: 40.2 % (ref 36.0–46.0)
Hemoglobin: 14.4 g/dL (ref 12.0–15.0)
MCH: 32.6 pg (ref 26.0–34.0)
MCHC: 35.8 g/dL (ref 30.0–36.0)
MCV: 91 fL (ref 78.0–100.0)
Platelets: 204 10*3/uL (ref 150–400)
RBC: 4.42 MIL/uL (ref 3.87–5.11)
RDW: 12.5 % (ref 11.5–15.5)
WBC: 7 10*3/uL (ref 4.0–10.5)

## 2012-12-17 LAB — BASIC METABOLIC PANEL
BUN: 12 mg/dL (ref 6–23)
CO2: 26 mEq/L (ref 19–32)
Calcium: 9.4 mg/dL (ref 8.4–10.5)
Chloride: 104 mEq/L (ref 96–112)
Creatinine, Ser: 0.74 mg/dL (ref 0.50–1.10)
GFR calc Af Amer: >90 mL/min (ref >90)
GFR calc non Af Amer: >90 mL/min (ref >90)
Glucose, Bld: 81 mg/dL (ref 70–99)
Potassium: 4 mEq/L (ref 3.5–5.1)
Sodium: 139 mEq/L (ref 135–145)

## 2012-12-17 MED ORDER — VANCOMYCIN HCL IN DEXTROSE 1-5 GM/200ML-% IV SOLN
1000.0000 mg | INTRAVENOUS | Status: AC
  Administered 2012-12-18: 1000 mg via INTRAVENOUS
  Filled 2012-12-17: qty 200

## 2012-12-17 NOTE — Progress Notes (Signed)
Pt states she has not had any chest pain since April- "they said the pain I had in April was GERD."  "I saw a cardiologist years ago-they did not find any cardiac problems."  Stress test from 2009 and Echo from 2013 in [place].

## 2012-12-18 ENCOUNTER — Inpatient Hospital Stay (HOSPITAL_COMMUNITY)
Admission: RE | Admit: 2012-12-18 | Discharge: 2012-12-20 | DRG: 491 | Disposition: A | Discharge status: Home / Self Care | Payer: 59 | Source: Ambulatory Visit | Attending: Neurosurgery | Admitting: Neurosurgery

## 2012-12-18 ENCOUNTER — Ambulatory Visit (HOSPITAL_COMMUNITY): Payer: 59 | Admitting: Anesthesiology

## 2012-12-18 ENCOUNTER — Encounter (HOSPITAL_COMMUNITY): Payer: Self-pay | Admitting: *Deleted

## 2012-12-18 ENCOUNTER — Encounter (HOSPITAL_COMMUNITY)
Admission: RE | Disposition: A | Discharge status: Home / Self Care | Payer: Self-pay | Source: Ambulatory Visit | Attending: Neurosurgery

## 2012-12-18 ENCOUNTER — Ambulatory Visit (HOSPITAL_COMMUNITY): Payer: 59

## 2012-12-18 ENCOUNTER — Encounter (HOSPITAL_COMMUNITY): Payer: Self-pay | Admitting: Anesthesiology

## 2012-12-18 DIAGNOSIS — G8929 Other chronic pain: Secondary | ICD-10-CM | POA: Diagnosis present

## 2012-12-18 DIAGNOSIS — I8390 Asymptomatic varicose veins of unspecified lower extremity: Secondary | ICD-10-CM | POA: Diagnosis present

## 2012-12-18 DIAGNOSIS — M545 Low back pain, unspecified: Secondary | ICD-10-CM | POA: Diagnosis present

## 2012-12-18 DIAGNOSIS — M5126 Other intervertebral disc displacement, lumbar region: Principal | ICD-10-CM | POA: Diagnosis present

## 2012-12-18 DIAGNOSIS — K219 Gastro-esophageal reflux disease without esophagitis: Secondary | ICD-10-CM | POA: Diagnosis present

## 2012-12-18 DIAGNOSIS — Z880 Allergy status to penicillin: Secondary | ICD-10-CM

## 2012-12-18 DIAGNOSIS — Z883 Allergy status to other anti-infective agents status: Secondary | ICD-10-CM

## 2012-12-18 DIAGNOSIS — Z9089 Acquired absence of other organs: Secondary | ICD-10-CM

## 2012-12-18 DIAGNOSIS — Z87442 Personal history of urinary calculi: Secondary | ICD-10-CM

## 2012-12-18 DIAGNOSIS — Z882 Allergy status to sulfonamides status: Secondary | ICD-10-CM

## 2012-12-18 DIAGNOSIS — K5730 Diverticulosis of large intestine without perforation or abscess without bleeding: Secondary | ICD-10-CM | POA: Diagnosis present

## 2012-12-18 DIAGNOSIS — E039 Hypothyroidism, unspecified: Secondary | ICD-10-CM | POA: Diagnosis present

## 2012-12-18 DIAGNOSIS — E669 Obesity, unspecified: Secondary | ICD-10-CM | POA: Diagnosis present

## 2012-12-18 DIAGNOSIS — J45909 Unspecified asthma, uncomplicated: Secondary | ICD-10-CM | POA: Diagnosis present

## 2012-12-18 DIAGNOSIS — Z79899 Other long term (current) drug therapy: Secondary | ICD-10-CM

## 2012-12-18 DIAGNOSIS — Z886 Allergy status to analgesic agent status: Secondary | ICD-10-CM

## 2012-12-18 DIAGNOSIS — R300 Dysuria: Secondary | ICD-10-CM | POA: Diagnosis present

## 2012-12-18 DIAGNOSIS — F411 Generalized anxiety disorder: Secondary | ICD-10-CM | POA: Diagnosis present

## 2012-12-18 HISTORY — PX: LUMBAR LAMINECTOMY/DECOMPRESSION MICRODISCECTOMY: SHX5026

## 2012-12-18 SURGERY — LUMBAR LAMINECTOMY/DECOMPRESSION MICRODISCECTOMY 1 LEVEL
Anesthesia: General | Site: Back | Laterality: Left | Wound class: Clean

## 2012-12-18 MED ORDER — PHENOL 1.4 % MT LIQD: 1.0000 | OROMUCOSAL | Status: DC | PRN

## 2012-12-18 MED ORDER — SODIUM CHLORIDE 0.9 % IJ SOLN: Freq: Once | INTRAMUSCULAR | Status: DC

## 2012-12-18 MED ORDER — MORPHINE SULFATE (PF) 1 MG/ML IV SOLN
INTRAVENOUS | Status: AC
  Filled 2012-12-18: qty 25

## 2012-12-18 MED ORDER — GLYCOPYRROLATE 0.2 MG/ML IJ SOLN
INTRAMUSCULAR | Status: DC | PRN
  Administered 2012-12-18: 0.2 mg via INTRAVENOUS
  Administered 2012-12-18: 0.4 mg via INTRAVENOUS

## 2012-12-18 MED ORDER — BUPIVACAINE LIPOSOME 1.3 % IJ SUSP
20.0000 mL | Freq: Once | INTRAMUSCULAR | Status: DC
  Filled 2012-12-18: qty 20

## 2012-12-18 MED ORDER — ROCURONIUM BROMIDE 100 MG/10ML IV SOLN
INTRAVENOUS | Status: DC | PRN
  Administered 2012-12-18: 40 mg via INTRAVENOUS

## 2012-12-18 MED ORDER — PROPOFOL 10 MG/ML IV BOLUS
INTRAVENOUS | Status: DC | PRN
  Administered 2012-12-18: 200 mg via INTRAVENOUS

## 2012-12-18 MED ORDER — DIPHENHYDRAMINE HCL 50 MG/ML IJ SOLN: 12.5000 mg | Freq: Four times a day (QID) | INTRAMUSCULAR | Status: DC | PRN

## 2012-12-18 MED ORDER — ALBUMIN HUMAN 5 % IV SOLN
INTRAVENOUS | Status: DC | PRN
  Administered 2012-12-18: 10:00:00 via INTRAVENOUS

## 2012-12-18 MED ORDER — NALOXONE HCL 0.4 MG/ML IJ SOLN: 0.4000 mg | INTRAMUSCULAR | Status: DC | PRN

## 2012-12-18 MED ORDER — HEMOSTATIC AGENTS (NO CHARGE) OPTIME
TOPICAL | Status: DC | PRN
  Administered 2012-12-18: 1 via TOPICAL

## 2012-12-18 MED ORDER — FENTANYL CITRATE 0.05 MG/ML IJ SOLN
INTRAMUSCULAR | Status: DC | PRN
  Administered 2012-12-18: 100 ug via INTRAVENOUS
  Administered 2012-12-18: 50 ug via INTRAVENOUS

## 2012-12-18 MED ORDER — ACETAMINOPHEN 650 MG RE SUPP: 650.0000 mg | RECTAL | Status: DC | PRN

## 2012-12-18 MED ORDER — SODIUM CHLORIDE 0.9 % IJ SOLN: 9.0000 mL | INTRAMUSCULAR | Status: DC | PRN

## 2012-12-18 MED ORDER — PHENYLEPHRINE HCL 10 MG/ML IJ SOLN
INTRAMUSCULAR | Status: DC | PRN
  Administered 2012-12-18 (×11): 80 ug via INTRAVENOUS

## 2012-12-18 MED ORDER — HYDROMORPHONE HCL PF 1 MG/ML IJ SOLN
INTRAMUSCULAR | Status: AC
Start: 2012-12-18 — End: 2012-12-18
  Filled 2012-12-18: qty 1

## 2012-12-18 MED ORDER — MENTHOL 3 MG MT LOZG: 1.0000 | LOZENGE | OROMUCOSAL | Status: DC | PRN

## 2012-12-18 MED ORDER — ACETAMINOPHEN 325 MG PO TABS: 650.0000 mg | ORAL_TABLET | ORAL | Status: DC | PRN

## 2012-12-18 MED ORDER — EPHEDRINE SULFATE 50 MG/ML IJ SOLN
INTRAMUSCULAR | Status: DC | PRN
  Administered 2012-12-18: 5 mg via INTRAVENOUS
  Administered 2012-12-18 (×2): 10 mg via INTRAVENOUS
  Administered 2012-12-18: 5 mg via INTRAVENOUS
  Administered 2012-12-18: 10 mg via INTRAVENOUS

## 2012-12-18 MED ORDER — NEOSTIGMINE METHYLSULFATE 1 MG/ML IJ SOLN
INTRAMUSCULAR | Status: DC | PRN
  Administered 2012-12-18: 3 mg via INTRAVENOUS

## 2012-12-18 MED ORDER — POTASSIUM CHLORIDE CRYS ER 20 MEQ PO TBCR
20.0000 meq | EXTENDED_RELEASE_TABLET | Freq: Every day | ORAL | Status: DC
  Administered 2012-12-18 – 2012-12-20 (×3): 20 meq via ORAL
  Filled 2012-12-18 (×3): qty 1

## 2012-12-18 MED ORDER — DEXAMETHASONE SODIUM PHOSPHATE 4 MG/ML IJ SOLN
INTRAMUSCULAR | Status: DC | PRN
  Administered 2012-12-18: 8 mg via INTRAVENOUS

## 2012-12-18 MED ORDER — OXYCODONE-ACETAMINOPHEN 5-325 MG PO TABS
1.0000 | ORAL_TABLET | ORAL | Status: DC | PRN
  Administered 2012-12-19 – 2012-12-20 (×6): 2 via ORAL
  Filled 2012-12-18 (×6): qty 2

## 2012-12-18 MED ORDER — PANTOPRAZOLE SODIUM 40 MG PO TBEC
40.0000 mg | DELAYED_RELEASE_TABLET | Freq: Every day | ORAL | Status: DC
  Administered 2012-12-19 – 2012-12-20 (×2): 40 mg via ORAL
  Filled 2012-12-18 (×2): qty 1

## 2012-12-18 MED ORDER — ONDANSETRON HCL 4 MG/2ML IJ SOLN: 4.0000 mg | INTRAMUSCULAR | Status: DC | PRN

## 2012-12-18 MED ORDER — LIDOCAINE HCL (CARDIAC) 20 MG/ML IV SOLN
INTRAVENOUS | Status: DC | PRN
  Administered 2012-12-18: 60 mg via INTRAVENOUS

## 2012-12-18 MED ORDER — ALBUTEROL SULFATE HFA 108 (90 BASE) MCG/ACT IN AERS
2.0000 | INHALATION_SPRAY | RESPIRATORY_TRACT | Status: DC | PRN
Start: 2012-12-18 — End: 2012-12-20

## 2012-12-18 MED ORDER — HYDROMORPHONE HCL PF 1 MG/ML IJ SOLN
0.2500 mg | INTRAMUSCULAR | Status: DC | PRN
  Administered 2012-12-18 (×2): 0.5 mg via INTRAVENOUS

## 2012-12-18 MED ORDER — BUPIVACAINE-EPINEPHRINE PF 0.5-1:200000 % IJ SOLN
INTRAMUSCULAR | Status: DC | PRN
  Administered 2012-12-18: 20 mL

## 2012-12-18 MED ORDER — PHENYTOIN SODIUM 50 MG/ML IJ SOLN
INTRAMUSCULAR | Status: AC
  Filled 2012-12-18: qty 2

## 2012-12-18 MED ORDER — SODIUM CHLORIDE 0.9 % IJ SOLN: 3.0000 mL | INTRAMUSCULAR | Status: DC | PRN

## 2012-12-18 MED ORDER — ONDANSETRON HCL 4 MG/2ML IJ SOLN
INTRAMUSCULAR | Status: DC | PRN
  Administered 2012-12-18 (×2): 4 mg via INTRAVENOUS

## 2012-12-18 MED ORDER — THROMBIN 5000 UNITS EX SOLR
CUTANEOUS | Status: DC | PRN
  Administered 2012-12-18: 5000 [IU] via TOPICAL

## 2012-12-18 MED ORDER — SODIUM CHLORIDE 0.9 % IJ SOLN
3.0000 mL | Freq: Two times a day (BID) | INTRAMUSCULAR | Status: DC
  Administered 2012-12-19: 3 mL via INTRAVENOUS

## 2012-12-18 MED ORDER — ONDANSETRON HCL 4 MG/2ML IJ SOLN: 4.0000 mg | Freq: Four times a day (QID) | INTRAMUSCULAR | Status: DC | PRN

## 2012-12-18 MED ORDER — GABAPENTIN 300 MG PO CAPS
300.0000 mg | ORAL_CAPSULE | Freq: Three times a day (TID) | ORAL | Status: DC
  Administered 2012-12-18 – 2012-12-20 (×6): 300 mg via ORAL
  Filled 2012-12-18 (×8): qty 1

## 2012-12-18 MED ORDER — 0.9 % SODIUM CHLORIDE (POUR BTL) OPTIME
TOPICAL | Status: DC | PRN
  Administered 2012-12-18: 1000 mL

## 2012-12-18 MED ORDER — MORPHINE SULFATE (PF) 1 MG/ML IV SOLN
INTRAVENOUS | Status: DC
  Administered 2012-12-18 (×2): via INTRAVENOUS
  Administered 2012-12-18: 25.5 mg via INTRAVENOUS
  Administered 2012-12-18 – 2012-12-19 (×2): 6 mg via INTRAVENOUS
  Administered 2012-12-19: 12.91 mg via INTRAVENOUS
  Administered 2012-12-19: 4.5 mg via INTRAVENOUS
  Filled 2012-12-18 (×2): qty 25

## 2012-12-18 MED ORDER — BUPIVACAINE LIPOSOME 1.3 % IJ SUSP
INTRAMUSCULAR | Status: DC | PRN
  Administered 2012-12-18: 20 mL

## 2012-12-18 MED ORDER — GABAPENTIN 600 MG PO TABS
300.0000 mg | ORAL_TABLET | Freq: Three times a day (TID) | ORAL | Status: DC
  Filled 2012-12-18 (×2): qty 0.5

## 2012-12-18 MED ORDER — HYDROMORPHONE HCL PF 1 MG/ML IJ SOLN: 0.2500 mg | INTRAMUSCULAR | Status: DC | PRN

## 2012-12-18 MED ORDER — CEFAZOLIN SODIUM 1-5 GM-% IV SOLN
1.0000 g | Freq: Three times a day (TID) | INTRAVENOUS | Status: AC
  Administered 2012-12-18 (×2): 1 g via INTRAVENOUS
  Filled 2012-12-18 (×2): qty 50

## 2012-12-18 MED ORDER — VANCOMYCIN HCL IN DEXTROSE 1-5 GM/200ML-% IV SOLN
1000.0000 mg | Freq: Once | INTRAVENOUS | Status: AC
  Administered 2012-12-18: 1000 mg via INTRAVENOUS
  Filled 2012-12-18: qty 200

## 2012-12-18 MED ORDER — LACTATED RINGERS IV SOLN
INTRAVENOUS | Status: DC | PRN
  Administered 2012-12-18 (×2): via INTRAVENOUS

## 2012-12-18 MED ORDER — DIAZEPAM 5 MG PO TABS
5.0000 mg | ORAL_TABLET | Freq: Four times a day (QID) | ORAL | Status: DC | PRN
  Administered 2012-12-18 – 2012-12-20 (×5): 5 mg via ORAL
  Filled 2012-12-18 (×5): qty 1

## 2012-12-18 MED ORDER — NITROGLYCERIN 0.4 MG SL SUBL: 0.4000 mg | SUBLINGUAL_TABLET | SUBLINGUAL | Status: DC | PRN

## 2012-12-18 MED ORDER — SODIUM CHLORIDE 0.9 % IV SOLN: 250.0000 mL | INTRAVENOUS | Status: DC

## 2012-12-18 MED ORDER — LEVOTHYROXINE SODIUM 88 MCG PO TABS
88.0000 ug | ORAL_TABLET | Freq: Every day | ORAL | Status: DC
  Administered 2012-12-19 – 2012-12-20 (×2): 88 ug via ORAL
  Filled 2012-12-18 (×3): qty 1

## 2012-12-18 MED ORDER — METHYLPREDNISOLONE ACETATE 80 MG/ML IJ SUSP
INTRAMUSCULAR | Status: DC | PRN
  Administered 2012-12-18: 80 mg

## 2012-12-18 MED ORDER — SODIUM CHLORIDE 0.9 % IV SOLN
INTRAVENOUS | Status: DC
  Administered 2012-12-18: 21:00:00 via INTRAVENOUS

## 2012-12-18 MED ORDER — MIDAZOLAM HCL 5 MG/5ML IJ SOLN
INTRAMUSCULAR | Status: DC | PRN
  Administered 2012-12-18: 2 mg via INTRAVENOUS

## 2012-12-18 MED ORDER — ZOLPIDEM TARTRATE 5 MG PO TABS: 5.0000 mg | ORAL_TABLET | Freq: Every evening | ORAL | Status: DC | PRN

## 2012-12-18 MED ORDER — DIPHENHYDRAMINE HCL 12.5 MG/5ML PO ELIX
12.5000 mg | ORAL_SOLUTION | Freq: Four times a day (QID) | ORAL | Status: DC | PRN
  Filled 2012-12-18: qty 5

## 2012-12-18 SURGICAL SUPPLY — 62 items
APL SKNCLS STERI-STRIP NONHPOA (GAUZE/BANDAGES/DRESSINGS) ×1
BENZOIN TINCTURE PRP APPL 2/3 (GAUZE/BANDAGES/DRESSINGS) ×2 IMPLANT
BLADE SURG ROTATE 9660 (MISCELLANEOUS) IMPLANT
BUR ACORN 6.0 (BURR) ×2 IMPLANT
BUR MATCHSTICK NEURO 3.0 LAGG (BURR) ×1 IMPLANT
CANISTER SUCTION 2500CC (MISCELLANEOUS) ×2 IMPLANT
CLOTH BEACON ORANGE TIMEOUT ST (SAFETY) ×2 IMPLANT
CONT SPEC 4OZ CLIKSEAL STRL BL (MISCELLANEOUS) ×2 IMPLANT
DRAPE LAPAROTOMY 100X72X124 (DRAPES) ×2 IMPLANT
DRAPE MICROSCOPE LEICA (MISCELLANEOUS) ×2 IMPLANT
DRAPE POUCH INSTRU U-SHP 10X18 (DRAPES) ×2 IMPLANT
DRSG PAD ABDOMINAL 8X10 ST (GAUZE/BANDAGES/DRESSINGS) IMPLANT
DURAPREP 26ML APPLICATOR (WOUND CARE) ×2 IMPLANT
ELECT BLADE 4.0 EZ CLEAN MEGAD (MISCELLANEOUS) ×2
ELECT REM PT RETURN 9FT ADLT (ELECTROSURGICAL) ×2
ELECTRODE BLDE 4.0 EZ CLN MEGD (MISCELLANEOUS) IMPLANT
ELECTRODE REM PT RTRN 9FT ADLT (ELECTROSURGICAL) ×1 IMPLANT
GAUZE SPONGE 4X4 16PLY XRAY LF (GAUZE/BANDAGES/DRESSINGS) IMPLANT
GLOVE BIOGEL M 8.0 STRL (GLOVE) ×1 IMPLANT
GLOVE BIOGEL PI IND STRL 6.5 (GLOVE) IMPLANT
GLOVE BIOGEL PI IND STRL 8 (GLOVE) IMPLANT
GLOVE BIOGEL PI INDICATOR 6.5 (GLOVE) ×1
GLOVE BIOGEL PI INDICATOR 8 (GLOVE) ×2
GLOVE EXAM NITRILE LRG STRL (GLOVE) IMPLANT
GLOVE EXAM NITRILE MD LF STRL (GLOVE) IMPLANT
GLOVE EXAM NITRILE XL STR (GLOVE) IMPLANT
GLOVE EXAM NITRILE XS STR PU (GLOVE) IMPLANT
GLOVE SURG SS PI 7.5 STRL IVOR (GLOVE) ×3 IMPLANT
GLOVE SURG SS PI 8.0 STRL IVOR (GLOVE) ×1 IMPLANT
GOWN BRE IMP SLV AUR LG STRL (GOWN DISPOSABLE) ×3 IMPLANT
GOWN BRE IMP SLV AUR XL STRL (GOWN DISPOSABLE) IMPLANT
GOWN STRL REIN 2XL LVL4 (GOWN DISPOSABLE) ×1 IMPLANT
KIT BASIN OR (CUSTOM PROCEDURE TRAY) ×2 IMPLANT
KIT ROOM TURNOVER OR (KITS) ×2 IMPLANT
NDL HYPO 18GX1.5 BLUNT FILL (NEEDLE) IMPLANT
NDL HYPO 21X1.5 SAFETY (NEEDLE) IMPLANT
NDL HYPO 25X1 1.5 SAFETY (NEEDLE) IMPLANT
NDL SPNL 20GX3.5 QUINCKE YW (NEEDLE) IMPLANT
NEEDLE HYPO 18GX1.5 BLUNT FILL (NEEDLE) ×2 IMPLANT
NEEDLE HYPO 21X1.5 SAFETY (NEEDLE) ×2 IMPLANT
NEEDLE HYPO 25X1 1.5 SAFETY (NEEDLE) IMPLANT
NEEDLE SPNL 20GX3.5 QUINCKE YW (NEEDLE) IMPLANT
NS IRRIG 1000ML POUR BTL (IV SOLUTION) ×2 IMPLANT
PACK LAMINECTOMY NEURO (CUSTOM PROCEDURE TRAY) ×2 IMPLANT
PAD ARMBOARD 7.5X6 YLW CONV (MISCELLANEOUS) ×6 IMPLANT
PATTIES SURGICAL .5 X1 (DISPOSABLE) ×2 IMPLANT
RUBBERBAND STERILE (MISCELLANEOUS) ×4 IMPLANT
SPONGE GAUZE 4X4 12PLY (GAUZE/BANDAGES/DRESSINGS) ×2 IMPLANT
SPONGE LAP 4X18 X RAY DECT (DISPOSABLE) IMPLANT
SPONGE SURGIFOAM ABS GEL SZ50 (HEMOSTASIS) ×2 IMPLANT
STRIP CLOSURE SKIN 1/2X4 (GAUZE/BANDAGES/DRESSINGS) ×2 IMPLANT
SUT VIC AB 0 CT1 18XCR BRD8 (SUTURE) ×1 IMPLANT
SUT VIC AB 0 CT1 8-18 (SUTURE) ×2
SUT VIC AB 2-0 CP2 18 (SUTURE) ×2 IMPLANT
SUT VIC AB 3-0 SH 8-18 (SUTURE) ×2 IMPLANT
SYR 20CC LL (SYRINGE) ×1 IMPLANT
SYR 20ML ECCENTRIC (SYRINGE) ×2 IMPLANT
SYR 5ML LL (SYRINGE) ×1 IMPLANT
TAPE CLOTH SURG 4X10 WHT LF (GAUZE/BANDAGES/DRESSINGS) ×1 IMPLANT
TOWEL OR 17X24 6PK STRL BLUE (TOWEL DISPOSABLE) ×2 IMPLANT
TOWEL OR 17X26 10 PK STRL BLUE (TOWEL DISPOSABLE) ×2 IMPLANT
WATER STERILE IRR 1000ML POUR (IV SOLUTION) ×2 IMPLANT

## 2012-12-18 NOTE — Anesthesia Preprocedure Evaluation (Addendum)
Anesthesia Evaluation  Patient identified by MRN, date of birth, ID band Patient awake    Reviewed: Allergy & Precautions, H&P , NPO status , Patient's Chart, lab work & pertinent test results  History of Anesthesia Complications (+) PONV  Airway Mallampati: II      Dental  (+) Teeth Intact   Pulmonary shortness of breath, asthma ,  breath sounds clear to auscultation        Cardiovascular Rhythm:Regular Rate:Normal     Neuro/Psych    GI/Hepatic Neg liver ROS, GERD-  Medicated and Controlled,  Endo/Other  Hypothyroidism   Renal/GU Renal disease     Musculoskeletal   Abdominal   Peds  Hematology   Anesthesia Other Findings   Reproductive/Obstetrics                        Anesthesia Physical Anesthesia Plan  ASA: II  Anesthesia Plan: General   Post-op Pain Management:    Induction: Intravenous  Airway Management Planned: Oral ETT  Additional Equipment:   Intra-op Plan:   Post-operative Plan: Extubation in OR  Informed Consent: I have reviewed the patients History and Physical, chart, labs and discussed the procedure including the risks, benefits and alternatives for the proposed anesthesia with the patient or authorized representative who has indicated his/her understanding and acceptance.   Dental advisory given  Plan Discussed with: CRNA, Anesthesiologist and Surgeon  Anesthesia Plan Comments:        Anesthesia Quick Evaluation

## 2012-12-18 NOTE — Op Note (Signed)
[name], Isabel              ACCOUNT NO.:  [id number]  MEDICAL RECORD NO.:  [id number]  LOCATION:  3C06C                        FACILITY:  [facility]  PHYSICIAN:  Leeroy Cha, M.D.   DATE OF BIRTH:  1955-12-10  DATE OF PROCEDURE:  12/18/2012 DATE OF DISCHARGE:                              OPERATIVE REPORT   PREOPERATIVE DIAGNOSIS:  Left L5-S1 extraforaminal herniated disk with compromise of the L5 nerve root.  POSTOPERATIVE DIAGNOSIS:  Left L5-S1 extraforaminal herniated disk with compromise of the L5 nerve root.  PROCEDURE:  Left intra and extraforaminal diskectomy, decompression of the L5 and S1 nerve root.  Microscope.  SURGEON:  Leeroy Cha, M.D.  ASSISTANT:  Dr. Christella Noa.  CLINICAL HISTORY:  [name] is a lady who since July had been complaining of back pain worsened to the left leg.  MRI done initially was negative.  When I saw her, she was having a lot of pain going to the left leg.  It was difficult to test any weakness secondary to the pain. Myelogram showed that she has a herniated disk, intraforaminal affecting the L5 nerve root.  The surgery was advised.  The patient knew the risk of the surgery such as no improvement, numbness, weakness, CSF leak, hematoma, need for further surgery.  DESCRIPTION OF PROCEDURE:  The patient was taken to the OR, and after intubation, she was positioned in a prone manner.  The back was cleaned and prepped with Betadine and later on with DuraPrep.  Then, drapes were applied.  Midline incision from L5-S1 was made and muscles were retracted all the way laterally until we were able to see the lateral aspect of the facet and the ala of the sacrum.  Then, with the help of the microscope, we did a laminotomy L5-1.  The yellow ligament also was excised.  We found the thecal sac as well as the S1 nerve root.  The disk at this level was flat although palpation from the midline to the lateral showed that there was quite a bit of  narrow compromising the L5 nerve root.  Then, we went laterally and we did the lateral aspect of the facet of L5-S1 and ala of the sacrum.  We identified the L5 as well as the L4 nerve root.  Then, we entered the disk space from the midline 1st and later on laterally, and we were able to remove too large missile fragments of disk.  The L5 nerve root was surrounded with quite a bit of scar tissue and lysis was accomplished with decompression of the L5 nerve root.  At the end, we had a good decompression of the L5 nerve root with a normal S1 nerve root.  The area was irrigated.  Valsalva maneuver was negative.  Then, Depo-Medrol and fentanyl were left in the epidural space and the wound was closed with Vicryl and Steri-Strips.         ______________________________ Leeroy Cha, M.D.    EB/MEDQ  D:  12/18/2012  T:  12/18/2012  Job:  [id number]

## 2012-12-18 NOTE — Anesthesia Postprocedure Evaluation (Signed)
  Anesthesia Post-op Note  Patient: [name]  Procedure(s) Performed: Procedure(s) with comments: Left Lumbar Five Sacral One Extraforaminal Microdiskectomy (Left) - LUMBAR LAMINECTOMY/DECOMPRESSION MICRODISCECTOMY 1 LEVEL  Patient Location: PACU  Anesthesia Type:General  Level of Consciousness: awake  Airway and Oxygen Therapy: Patient Spontanous Breathing  Post-op Pain: mild  Post-op Assessment: Post-op Vital signs reviewed  Post-op Vital Signs: Reviewed  Complications: No apparent anesthesia complications

## 2012-12-18 NOTE — Progress Notes (Signed)
Op note [id number]

## 2012-12-18 NOTE — Progress Notes (Signed)
C/o incisional pain and tingling left 3 and 4 toes. No pain as preop. No weakness. Spoke with husband and parents

## 2012-12-18 NOTE — Anesthesia Procedure Notes (Signed)
Procedure Name: Intubation Date/Time: 12/18/2012 9:00 AM Performed by: Kyung Rudd Pre-anesthesia Checklist: Patient identified, Emergency Drugs available, Suction available, Patient being monitored and Timeout performed Patient Re-evaluated:Patient Re-evaluated prior to inductionOxygen Delivery Method: Circle system utilized Preoxygenation: Pre-oxygenation with 100% oxygen Intubation Type: IV induction Ventilation: Mask ventilation without difficulty Tube type: Oral Tube size: 7.0 mm Number of attempts: 2 Airway Equipment and Method: Stylet and Video-laryngoscopy Placement Confirmation: ETT inserted through vocal cords under direct vision,  positive ETCO2 and breath sounds checked- equal and bilateral Secured at: 22 cm Tube secured with: Tape Dental Injury: Teeth and Oropharynx as per pre-operative assessment  Comments: 1st attempt intubation with MAC3, unable to visualize VC per CRNA. AOI with glidescope on 2nd attempt per CRNA. Grade II view with glidescope. +ETCO2 & BBS=.
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
## 2012-12-18 NOTE — OR Nursing (Signed)
Patient received 100mg /36ml Phenytoin injection to operation site.

## 2012-12-18 NOTE — Transfer of Care (Signed)
Immediate Anesthesia Transfer of Care Note  Patient: [name]  Procedure(s) Performed: Procedure(s) with comments: Left Lumbar Five Sacral One Extraforaminal Microdiskectomy (Left) - LUMBAR LAMINECTOMY/DECOMPRESSION MICRODISCECTOMY 1 LEVEL  Patient Location: PACU  Anesthesia Type:General  Level of Consciousness: sedated  Airway & Oxygen Therapy: Patient Spontanous Breathing and Patient connected to face mask oxygen  Post-op Assessment: Report given to PACU RN, Post -op Vital signs reviewed and stable and Patient moving all extremities X 4  Post vital signs: Reviewed and stable  Complications: No apparent anesthesia complications
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
## 2012-12-18 NOTE — Progress Notes (Signed)
Morphine PCA syringe replaced, 4mg  of Morphine was wasted from old syringe. Med not found in pyxis to waste. Waste witnessed by Vickii Penna, NT. Holli Humbles, RN

## 2012-12-18 NOTE — Preoperative (Signed)
Beta Blockers   Reason not to administer Beta Blockers:Not Applicable 

## 2012-12-19 MED ORDER — MORPHINE SULFATE 2 MG/ML IJ SOLN
2.0000 mg | INTRAMUSCULAR | Status: DC | PRN
  Administered 2012-12-19: 2 mg via INTRAVENOUS
  Filled 2012-12-19: qty 1

## 2012-12-19 NOTE — Progress Notes (Signed)
Patient ID: [name], female   DOB: 09-29-1955, 57 y.o.   MRN: [id number] Less radicular pain than preop. Sensory normal. Left foot 4/5 . No headche. To be seen by pt. Off morphine. Discharge this pm

## 2012-12-19 NOTE — Evaluation (Signed)
Physical Therapy Evaluation Patient Details Name: [name] MRN: [id number] DOB: 1955-12-10 Today's Date: 12/19/2012 Time: 1040-1106 PT Time Calculation (min): 26 min  PT Assessment / Plan / Recommendation History of Present Illness  Left intra and extraforaminal diskectomy, decompression of L5-S1.  Clinical Impression  Pt with above surgery with post-op pain which is primary limiting factor for mobility.  Pt is mobilizing adequately for return home with intermittent assist.    PT Assessment  Patent does not need any further PT services    Follow Up Recommendations  No PT follow up;Supervision - Intermittent    Does the patient have the potential to tolerate intense rehabilitation      Barriers to Discharge        Equipment Recommendations  Rolling walker with 5" wheels (pt's old walker is standard walker.)    Recommendations for Other Services     Frequency      Precautions / Restrictions Precautions Precautions: Back Precaution Comments: Educated pt on 3/3 back precautions.   Pertinent Vitals/Pain See flow sheet.      Mobility  Bed Mobility Bed Mobility: Sit to Sidelying Right Sit to Sidelying Right: 6: Modified independent (Device/Increase time);HOB flat Details for Bed Mobility Assistance: Incr time Transfers Sit to Stand: 6: Modified independent (Device/Increase time);With upper extremity assist;From bed Stand to Sit: 6: Modified independent (Device/Increase time);With upper extremity assist;To bed Details for Transfer Assistance: Incr time Ambulation/Gait Ambulation/Gait Assistance: 6: Modified independent (Device/Increase time) Ambulation Distance (Feet): 125 Feet Assistive device: Rolling walker Ambulation/Gait Assistance Details: Initial instruction for use of walker. Gait Pattern: Step-through pattern;Decreased step length - right;Decreased step length - left;Antalgic Gait velocity: decr Stairs: Yes Stairs Assistance: 4: Min assist Stairs  Assistance Details (indicate cue type and reason): hand-held on lt and verbal cues for technique. Stair Management Technique: Step to pattern;Forwards Number of Stairs: 1    Exercises     PT Diagnosis:    PT Problem List:   PT Treatment Interventions:       PT Goals(Current goals can be found in the care plan section) Acute Rehab PT Goals Patient Stated Goal: for left leg pain to improve PT Goal Formulation: No goals set, d/c therapy  Visit Information  Last PT Received On: 12/19/12 Assistance Needed: +1 History of Present Illness: Left intra and extraforaminal diskectomy, decompression of L5-S1.       Prior [place] expects to be discharged to:: Private residence Living Arrangements: Spouse/significant other;Children;Parent Available Help at Discharge: Family;Available PRN/intermittently Type of Home: House Home Access: Stairs to enter [employer] of Steps: 3 Entrance Stairs-Rails: None Home Layout: One level Home Equipment: Walker - standard;Shower seat - built Hotel manager: [place] Prior Function Level of Independence: Needs assistance ADL's / Homemaking Assistance Needed: Husband assists pt with donning socks and ted hose.  Pt reports ADLs are very effortful.  She has been sponge bathing for past few weeks due to fear of falling in shower. Communication Communication: No difficulties Dominant Hand: Right    Cognition  Cognition Arousal/Alertness: Awake/alert Behavior During Therapy: WFL for tasks assessed/performed Overall Cognitive Status: Within Functional Limits for tasks assessed    Extremity/Trunk Assessment Upper Extremity Assessment Upper Extremity Assessment: Overall WFL for tasks assessed Lower Extremity Assessment Lower Extremity Assessment: Overall WFL for tasks assessed   Balance Balance Balance Assessed: Yes Static Standing Balance Static Standing - Balance Support: Bilateral  upper extremity supported Static Standing - Level of Assistance: 6: Modified independent (Device/Increase time)  End  of Session PT - End of Session Activity Tolerance: Patient limited by pain Patient left: in bed;with call bell/phone within reach Nurse Communication: Mobility status  GP     [facility] 12/19/2012, 12:08 PM  [facility] PT [phone]

## 2012-12-19 NOTE — Evaluation (Signed)
Occupational Therapy Evaluation Patient Details Name: [name] MRN: [id number] DOB: 1955/11/02 Today's Date: 12/19/2012 Time: 1015-1040 OT Time Calculation (min): 25 min  OT Assessment / Plan / Recommendation History of present illness  s/p L5-S1 lumbar lami/decompression microdiscectomy   Clinical Impression   Pt s/p L5-S1 lumbar lami/decompression microdiscectomy. Pt will continue to benefit from acute OT services to address below problem list.  Pt appropriate for possible d/c home today. If pt still here tomorrow, will continue to follow.     OT Assessment  Patient needs continued OT Services    Follow Up Recommendations  No OT follow up;Supervision - Intermittent    Barriers to Discharge      Equipment Recommendations  None recommended by OT    Recommendations for Other Services    Frequency  Min 2X/week    Precautions / Restrictions Precautions Precautions: Back Precaution Comments: Educated pt on 3/3 back precautions.   Pertinent Vitals/Pain See vitals    ADL  Eating/Feeding: Performed;Independent Where Assessed - Eating/Feeding: Edge of bed Grooming: Performed;Wash/dry hands;Supervision/safety Where Assessed - Grooming: Unsupported standing Upper Body Bathing: Simulated;Supervision/safety;Set up Where Assessed - Upper Body Bathing: Unsupported sitting Lower Body Bathing: Simulated;Minimal assistance (with AE) Where Assessed - Lower Body Bathing: Unsupported sit to stand Upper Body Dressing: Performed;Supervision/safety;Set up Where Assessed - Upper Body Dressing: Unsupported sitting Lower Body Dressing: Performed;Minimal assistance (with AE) Where Assessed - Lower Body Dressing: Unsupported sit to stand Toilet Transfer: Performed;Min guard Toilet Transfer Method: Sit to Loss adjuster, chartered: Comfort height toilet Toileting - Clothing Manipulation and Hygiene: Performed;Supervision/safety Where Assessed - Best boy and  Hygiene: Sit to stand from 3-in-1 or toilet Equipment Used: Gait belt Transfers/Ambulation Related to ADLs: Pt ambulating with min guard assist for safety only. Incr time due to pain.  Pt reaching out to hold onto wall for support due to LLE pain.  ADL Comments: Educated pt on AE for LB bathing/dressing tasks.  Pt able to demo use of reacher, LHS and sock aid. Educated pt on acquisition of AE.  Incr time for all tasks due to pain.  Pt declining practicing tub transfer at this time due to pain. States she will likely sponge bathe at home until her pain improves.    OT Diagnosis: Generalized weakness;Acute pain  OT Problem List: Decreased strength;Decreased activity tolerance;Decreased knowledge of use of DME or AE;Decreased knowledge of precautions;Pain OT Treatment Interventions: Self-care/ADL training;DME and/or AE instruction;Therapeutic activities;Patient/family education   OT Goals(Current goals can be found in the care plan section) Acute Rehab OT Goals Patient Stated Goal: for left leg pain to improve OT Goal Formulation: With patient Time For Goal Achievement: 12/26/12 Potential to Achieve Goals: Good  Visit Information  Last OT Received On: 12/19/12 Assistance Needed: +1       Prior [place] expects to be discharged to:: Private residence Living Arrangements: Spouse/significant other;Children;Parent Available Help at Discharge: Family;Available PRN/intermittently Type of Home: House Home Access: Stairs to enter [employer] of Steps: 3 Entrance Stairs-Rails: None Home Layout: One level Home Equipment: Walker - 2 wheels;Shower seat - built Hotel manager: Reacher Prior Function Level of Independence: Needs assistance ADL's / Homemaking Assistance Needed: Husband assists pt with donning socks and ted hose.  Pt reports ADLs are very effortful.  She has been sponge bathing for past few weeks due to fear of  falling in shower. Communication Communication: No difficulties Dominant Hand: Right  Vision/Perception     Cognition  Cognition Arousal/Alertness: Awake/alert Behavior During Therapy: WFL for tasks assessed/performed Overall Cognitive Status: Within Functional Limits for tasks assessed    Extremity/Trunk Assessment Upper Extremity Assessment Upper Extremity Assessment: Overall WFL for tasks assessed     Mobility Bed Mobility Bed Mobility: Not assessed Transfers Transfers: Sit to Stand;Stand to Sit Sit to Stand: 5: Supervision;From bed;With upper extremity assist Stand to Sit: 5: Supervision;To bed;With upper extremity assist Details for Transfer Assistance: Incr time to initiate and complete transfer.  No physical assist needed.     Exercise     Balance     End of Session OT - End of Session Equipment Utilized During Treatment: Gait belt Activity Tolerance: Patient limited by pain Patient left: in bed;with call bell/phone within reach (with PT) Nurse Communication: Mobility status  GO    12/19/2012 Darrol Jump OTR/L Pager [phone] Office [phone]  Darrol Jump 12/19/2012, 11:01 AM

## 2012-12-20 ENCOUNTER — Encounter (HOSPITAL_COMMUNITY): Payer: Self-pay | Admitting: Neurosurgery

## 2012-12-20 NOTE — Progress Notes (Signed)
Came to see patient on behalf of [employer] to [employer] program for [employer] employees/dependents with [employer]. Patient has already been discharged. Did not appear she had any identifiable needs for [employer] to Wellness program.  Marthenia Rolling, MSN-Ed, RN, [facility] [phone]

## 2012-12-20 NOTE — Discharge Summary (Signed)
Physician Discharge Summary  Patient ID: [name] MRN: [id number] DOB/AGE: 1955-10-27 57 y.o.  Admit date: 12/18/2012 Discharge date: 12/20/2012  Admission Diagnoses:left l5s1 hnp  Discharge Diagnoses: same Active Problems:   * No active hospital problems. *   Discharged Condition: better Hospital Course: surgery  Consults: none  Significant Diagnostic Studies:myelo  Treatments: left l5s`1 discectomy  Discharge Exam: Blood pressure 122/89, pulse 95, temperature 97.8 F (36.6 C), temperature source Oral, resp. rate 18, SpO2 100.00%. No apin as preop Disposition: home     Medication List    ASK your doctor about these medications       acetaminophen 500 MG tablet  Commonly known as:  TYLENOL  Take 500 mg by mouth every 6 (six) hours as needed for pain.     albuterol 108 (90 BASE) MCG/ACT inhaler  Commonly known as:  PROAIR HFA  Inhale 2 puffs into the lungs every 4 (four) hours as needed for wheezing.     cyclobenzaprine 10 MG tablet  Commonly known as:  FLEXERIL  Take 1 tablet (10 mg total) by mouth 3 (three) times daily as needed for muscle spasms.     fexofenadine-pseudoephedrine 60-120 MG per tablet  Commonly known as:  ALLEGRA-D  Take 1 tablet by mouth daily.     fluticasone 50 MCG/ACT nasal spray  Commonly known as:  FLONASE  Place 2 sprays into the nose daily.     levothyroxine 88 MCG tablet  Commonly known as:  SYNTHROID, LEVOTHROID  Take 1 tablet (88 mcg total) by mouth daily.     multivitamin tablet  Take 1 tablet by mouth daily.     nitroGLYCERIN 0.4 MG SL tablet  Commonly known as:  NITROSTAT  Place 1 tablet (0.4 mg total) under the tongue every 5 (five) minutes as needed for chest pain.     ondansetron 4 MG tablet  Commonly known as:  ZOFRAN  Take 8 mg by mouth every 8 (eight) hours as needed for nausea.     oxyCODONE 15 MG immediate release tablet  Commonly known as:  ROXICODONE  Take 7.5-15 mg by mouth every 6 (six) hours.      pantoprazole 40 MG tablet  Commonly known as:  PROTONIX  Take 1 tablet (40 mg total) by mouth daily.     potassium chloride SA 20 MEQ tablet  Commonly known as:  K-DUR,KLOR-CON  Take 1 tablet (20 mEq total) by mouth daily.         Signed: Floyce Stakes 12/20/2012, 12:47 PM

## 2012-12-20 NOTE — Progress Notes (Signed)
Pt. discharged home accompanied by husband. Prescriptions and discharge instructions given with verbalization of understanding. Incision site on back with no s/s of infection - no swelling, redness, bleeding, and/or drainage noted. Pain med given just before leaving. Opportunity given to ask questions but no question asked. Pt. transported out of this unit in wheelchair by the volunteer.

## 2013-01-06 ENCOUNTER — Encounter: Payer: Self-pay | Admitting: Internal Medicine

## 2013-01-06 ENCOUNTER — Other Ambulatory Visit: Payer: Self-pay | Admitting: Internal Medicine

## 2013-01-06 ENCOUNTER — Other Ambulatory Visit: Payer: Self-pay | Admitting: *Deleted

## 2013-01-06 MED ORDER — FEXOFENADINE-PSEUDOEPHED ER 60-120 MG PO TB12: 1.0000 | ORAL_TABLET | Freq: Every day | ORAL | Status: DC

## 2013-01-06 MED ORDER — FLUTICASONE PROPIONATE 50 MCG/ACT NA SUSP: 2.0000 | Freq: Every day | NASAL | Status: DC

## 2013-01-06 MED ORDER — POTASSIUM CHLORIDE CRYS ER 20 MEQ PO TBCR: EXTENDED_RELEASE_TABLET | ORAL | Status: DC

## 2013-01-06 NOTE — Telephone Encounter (Signed)
Sent email needing refills sent on allegra d, flonase and potassium sent to [facility]

## 2013-01-08 ENCOUNTER — Other Ambulatory Visit: Payer: Self-pay | Admitting: Internal Medicine

## 2013-01-08 ENCOUNTER — Ambulatory Visit: Payer: 59 | Attending: Neurosurgery | Admitting: Physical Therapy

## 2013-01-08 DIAGNOSIS — R262 Difficulty in walking, not elsewhere classified: Secondary | ICD-10-CM | POA: Insufficient documentation

## 2013-01-08 DIAGNOSIS — M545 Low back pain, unspecified: Secondary | ICD-10-CM | POA: Insufficient documentation

## 2013-01-08 DIAGNOSIS — IMO0001 Reserved for inherently not codable concepts without codable children: Secondary | ICD-10-CM | POA: Insufficient documentation

## 2013-01-09 ENCOUNTER — Ambulatory Visit: Payer: 59 | Admitting: Rehabilitation

## 2013-01-10 ENCOUNTER — Other Ambulatory Visit: Payer: Self-pay | Admitting: Urology

## 2013-01-10 ENCOUNTER — Other Ambulatory Visit: Payer: Self-pay

## 2013-01-10 DIAGNOSIS — R310 Gross hematuria: Secondary | ICD-10-CM

## 2013-01-10 MED ORDER — PANTOPRAZOLE SODIUM 40 MG PO TBEC: 40.0000 mg | DELAYED_RELEASE_TABLET | Freq: Every day | ORAL | Status: DC

## 2013-01-15 ENCOUNTER — Ambulatory Visit: Payer: 59 | Admitting: Physical Therapy

## 2013-01-16 ENCOUNTER — Ambulatory Visit (HOSPITAL_COMMUNITY): Payer: 59

## 2013-01-21 ENCOUNTER — Ambulatory Visit: Payer: 59 | Admitting: Physical Therapy

## 2013-01-22 ENCOUNTER — Ambulatory Visit: Payer: 59 | Admitting: Physical Therapy

## 2013-01-23 ENCOUNTER — Ambulatory Visit: Payer: 59 | Attending: Neurosurgery | Admitting: Physical Therapy

## 2013-01-23 DIAGNOSIS — M545 Low back pain, unspecified: Secondary | ICD-10-CM | POA: Insufficient documentation

## 2013-01-23 DIAGNOSIS — R262 Difficulty in walking, not elsewhere classified: Secondary | ICD-10-CM | POA: Insufficient documentation

## 2013-01-23 DIAGNOSIS — IMO0001 Reserved for inherently not codable concepts without codable children: Secondary | ICD-10-CM | POA: Insufficient documentation

## 2013-01-29 ENCOUNTER — Ambulatory Visit: Payer: 59 | Admitting: Physical Therapy

## 2013-02-04 ENCOUNTER — Ambulatory Visit: Payer: 59 | Admitting: Physical Therapy

## 2013-02-05 ENCOUNTER — Ambulatory Visit: Payer: 59 | Admitting: Rehabilitation

## 2013-02-05 ENCOUNTER — Ambulatory Visit: Payer: 59 | Admitting: Physical Therapy

## 2013-02-06 ENCOUNTER — Encounter: Payer: 59 | Admitting: Physical Therapy

## 2013-02-10 ENCOUNTER — Encounter: Payer: 59 | Admitting: Physical Therapy

## 2013-02-12 ENCOUNTER — Ambulatory Visit: Payer: 59 | Admitting: Physical Therapy

## 2013-02-13 ENCOUNTER — Ambulatory Visit: Payer: 59 | Admitting: Physical Therapy

## 2013-02-18 ENCOUNTER — Ambulatory Visit: Payer: 59 | Admitting: Physical Therapy

## 2013-02-20 ENCOUNTER — Ambulatory Visit: Payer: 59 | Admitting: Physical Therapy

## 2013-02-25 ENCOUNTER — Ambulatory Visit: Payer: 59 | Attending: Neurosurgery | Admitting: Physical Therapy

## 2013-02-25 DIAGNOSIS — R262 Difficulty in walking, not elsewhere classified: Secondary | ICD-10-CM | POA: Insufficient documentation

## 2013-02-25 DIAGNOSIS — M545 Low back pain, unspecified: Secondary | ICD-10-CM | POA: Insufficient documentation

## 2013-02-25 DIAGNOSIS — IMO0001 Reserved for inherently not codable concepts without codable children: Secondary | ICD-10-CM | POA: Insufficient documentation

## 2013-02-26 ENCOUNTER — Other Ambulatory Visit (INDEPENDENT_AMBULATORY_CARE_PROVIDER_SITE_OTHER): Payer: 59

## 2013-02-26 ENCOUNTER — Ambulatory Visit (INDEPENDENT_AMBULATORY_CARE_PROVIDER_SITE_OTHER): Payer: 59 | Admitting: Internal Medicine

## 2013-02-26 ENCOUNTER — Encounter: Payer: Self-pay | Admitting: Internal Medicine

## 2013-02-26 VITALS — BP 120/70 | HR 96 | Temp 98.0°F | Ht 64.0 in | Wt 194.5 lb

## 2013-02-26 DIAGNOSIS — Z0000 Encounter for general adult medical examination without abnormal findings: Secondary | ICD-10-CM

## 2013-02-26 DIAGNOSIS — F411 Generalized anxiety disorder: Secondary | ICD-10-CM

## 2013-02-26 DIAGNOSIS — M79609 Pain in unspecified limb: Secondary | ICD-10-CM

## 2013-02-26 DIAGNOSIS — M79604 Pain in right leg: Secondary | ICD-10-CM

## 2013-02-26 DIAGNOSIS — R609 Edema, unspecified: Secondary | ICD-10-CM

## 2013-02-26 LAB — HEPATIC FUNCTION PANEL
ALT: 25 U/L (ref 0–35)
AST: 28 U/L (ref 0–37)
Albumin: 4 g/dL (ref 3.5–5.2)
Alkaline Phosphatase: 84 U/L (ref 39–117)
Bilirubin, Direct: 0.1 mg/dL (ref 0.0–0.3)
Total Bilirubin: 0.4 mg/dL (ref 0.3–1.2)
Total Protein: 7.1 g/dL (ref 6.0–8.3)

## 2013-02-26 LAB — URINALYSIS, ROUTINE W REFLEX MICROSCOPIC
Bilirubin Urine: NEGATIVE
Hgb urine dipstick: NEGATIVE
Ketones, ur: NEGATIVE
Nitrite: NEGATIVE
Specific Gravity, Urine: 1.01 (ref 1.000–1.030)
Total Protein, Urine: NEGATIVE
Urine Glucose: NEGATIVE
Urobilinogen, UA: 0.2 (ref 0.0–1.0)
pH: 7 (ref 5.0–8.0)

## 2013-02-26 LAB — LIPID PANEL
Cholesterol: 153 mg/dL (ref 0–200)
HDL: 55.6 mg/dL (ref >39.00)
LDL Cholesterol: 85 mg/dL (ref 0–99)
Total CHOL/HDL Ratio: 3
Triglycerides: 62 mg/dL (ref 0.0–149.0)
VLDL: 12.4 mg/dL (ref 0.0–40.0)

## 2013-02-26 LAB — BASIC METABOLIC PANEL
BUN: 12 mg/dL (ref 6–23)
CO2: 27 mEq/L (ref 19–32)
Calcium: 9.1 mg/dL (ref 8.4–10.5)
Chloride: 104 mEq/L (ref 96–112)
Creatinine, Ser: 0.8 mg/dL (ref 0.4–1.2)
GFR: 80.21 mL/min (ref >60.00)
Glucose, Bld: 80 mg/dL (ref 70–99)
Potassium: 4 mEq/L (ref 3.5–5.1)
Sodium: 137 mEq/L (ref 135–145)

## 2013-02-26 LAB — TSH: TSH: 3.13 u[IU]/mL (ref 0.35–5.50)

## 2013-02-26 MED ORDER — GABAPENTIN 100 MG PO CAPS: ORAL_CAPSULE | ORAL | Status: DC

## 2013-02-26 MED ORDER — FEXOFENADINE-PSEUDOEPHED ER 60-120 MG PO TB12: 1.0000 | ORAL_TABLET | Freq: Every day | ORAL | Status: DC

## 2013-02-26 MED ORDER — POTASSIUM CHLORIDE CRYS ER 20 MEQ PO TBCR: EXTENDED_RELEASE_TABLET | ORAL | Status: DC
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
## 2013-02-26 NOTE — Patient Instructions (Signed)
Please increase the lasix to 40 mg twice per day for 5 days to help relieve the increased fluid and pain Ok to stop the gabapentin 300 mg Please take all new medication as prescribed - the gabapentin 100 mg as prescribed as needed Please continue all other medications as before, including the potassium Please call if you need the klonopin medication for nerves Please keep your appointments with your specialists as you have planned, including the physical therapy Watch your weight daily at home, as you should be losing up to 1 lb per day; you should hold the lasix at one per day if the wt loss with twice per day is more than 1 lb I think your goal is to lose about 5 lbs of fluid wt Continue your compression stockings daily, and low salt diet Please keep your legs elevated if you are sitting, such as at home Sometime weight loss can help with less leg swelling in the future as well  Please remember to sign up for My Chart if you have not done so, as this will be important to you in the future with finding out test results, communicating by private email, and scheduling acute appointments online when needed.  Please return in 1 months, or sooner if needed

## 2013-02-26 NOTE — Progress Notes (Signed)
Pre-visit discussion using our clinic review tool. No additional management support is needed unless otherwise documented below in the visit note.  

## 2013-02-27 ENCOUNTER — Ambulatory Visit: Payer: 59 | Admitting: Physical Therapy

## 2013-02-27 ENCOUNTER — Other Ambulatory Visit: Payer: Self-pay

## 2013-02-27 DIAGNOSIS — M79604 Pain in right leg: Secondary | ICD-10-CM | POA: Insufficient documentation

## 2013-02-27 NOTE — Assessment & Plan Note (Signed)
To inr lasix to 40 bid for 5 days, then reduce to 40 qd, follow daily wts,  to f/u any worsening symptoms or concerns

## 2013-02-27 NOTE — Assessment & Plan Note (Addendum)
Mild icnrease recently, mostly stable overall by history and exam, recent data reviewed with pt, and pt to continue medical treatment as before,  to f/u any worsening symptoms or concerns, consdier add klonopin prn Lab Results  Component Value Date   WBC 7.0 12/17/2012   HGB 14.4 12/17/2012   HCT 40.2 12/17/2012   PLT 204 12/17/2012   GLUCOSE 80 02/26/2013   CHOL 153 02/26/2013   TRIG 62.0 02/26/2013   HDL 55.60 02/26/2013   LDLCALC 85 02/26/2013   ALT 25 02/26/2013   AST 28 02/26/2013   NA 137 02/26/2013   K 4.0 02/26/2013   CL 104 02/26/2013   CREATININE 0.8 02/26/2013   BUN 12 02/26/2013   CO2 27 02/26/2013   TSH 3.13 02/26/2013   INR 0.94 01/27/2009   HGBA1C 5.5 04/17/2011

## 2013-02-27 NOTE — Assessment & Plan Note (Signed)
To re-try gabapentin but at lower dosing, slower rampup to avoid sedation

## 2013-02-28 ENCOUNTER — Telehealth: Payer: Self-pay | Admitting: *Deleted

## 2013-02-28 NOTE — Telephone Encounter (Signed)
Spoke with pt advised not significant amount of blood to perform test.

## 2013-02-28 NOTE — Telephone Encounter (Signed)
Pt called requesting CBC w/diff results from 11.5.14.  Please advise

## 2013-02-28 NOTE — Telephone Encounter (Signed)
I dont see results on chart.  Please call lab

## 2013-03-03 ENCOUNTER — Other Ambulatory Visit (INDEPENDENT_AMBULATORY_CARE_PROVIDER_SITE_OTHER): Payer: 59

## 2013-03-03 DIAGNOSIS — Z0000 Encounter for general adult medical examination without abnormal findings: Secondary | ICD-10-CM

## 2013-03-03 LAB — CBC WITH DIFFERENTIAL/PLATELET
Basophils Absolute: 0 10*3/uL (ref 0.0–0.1)
Basophils Relative: 0.2 % (ref 0.0–3.0)
Eosinophils Absolute: 0.5 10*3/uL (ref 0.0–0.7)
Eosinophils Relative: 5.6 % — ABNORMAL HIGH (ref 0.0–5.0)
HCT: 39.6 % (ref 36.0–46.0)
Hemoglobin: 13.6 g/dL (ref 12.0–15.0)
Lymphocytes Relative: 21.9 % (ref 12.0–46.0)
Lymphs Abs: 1.8 10*3/uL (ref 0.7–4.0)
MCHC: 34.4 g/dL (ref 30.0–36.0)
MCV: 92.3 fl (ref 78.0–100.0)
Monocytes Absolute: 0.6 10*3/uL (ref 0.1–1.0)
Monocytes Relative: 6.7 % (ref 3.0–12.0)
Neutro Abs: 5.5 10*3/uL (ref 1.4–7.7)
Neutrophils Relative %: 65.6 % (ref 43.0–77.0)
Platelets: 242 10*3/uL (ref 150.0–400.0)
RBC: 4.29 Mil/uL (ref 3.87–5.11)
RDW: 12.7 % (ref 11.5–14.6)
WBC: 8.4 10*3/uL (ref 4.5–10.5)

## 2013-03-04 ENCOUNTER — Ambulatory Visit: Payer: 59 | Admitting: Physical Therapy

## 2013-03-06 ENCOUNTER — Ambulatory Visit: Payer: 59 | Admitting: Physical Therapy

## 2013-03-17 ENCOUNTER — Other Ambulatory Visit (HOSPITAL_COMMUNITY): Payer: Self-pay | Admitting: Obstetrics and Gynecology

## 2013-03-17 DIAGNOSIS — Z1231 Encounter for screening mammogram for malignant neoplasm of breast: Secondary | ICD-10-CM

## 2013-03-18 ENCOUNTER — Ambulatory Visit: Payer: 59 | Admitting: Physical Therapy

## 2013-03-27 ENCOUNTER — Ambulatory Visit (HOSPITAL_COMMUNITY)
Admission: RE | Admit: 2013-03-27 | Discharge: 2013-03-27 | Disposition: A | Discharge status: Home / Self Care | Payer: 59 | Source: Ambulatory Visit | Attending: Obstetrics and Gynecology | Admitting: Obstetrics and Gynecology

## 2013-03-27 DIAGNOSIS — Z1231 Encounter for screening mammogram for malignant neoplasm of breast: Secondary | ICD-10-CM | POA: Insufficient documentation

## 2013-03-27 DIAGNOSIS — Z803 Family history of malignant neoplasm of breast: Secondary | ICD-10-CM | POA: Insufficient documentation

## 2013-03-31 ENCOUNTER — Encounter: Payer: Self-pay | Admitting: Internal Medicine

## 2013-04-01 NOTE — Telephone Encounter (Signed)
Robin to update chart if needed for above

## 2013-04-02 ENCOUNTER — Encounter: Payer: Self-pay | Admitting: Internal Medicine

## 2013-04-02 ENCOUNTER — Ambulatory Visit (INDEPENDENT_AMBULATORY_CARE_PROVIDER_SITE_OTHER): Payer: 59 | Admitting: Internal Medicine

## 2013-04-02 VITALS — BP 102/68 | HR 92 | Temp 97.3°F | Ht 64.0 in | Wt 191.5 lb

## 2013-04-02 DIAGNOSIS — Z0000 Encounter for general adult medical examination without abnormal findings: Secondary | ICD-10-CM

## 2013-04-02 NOTE — Assessment & Plan Note (Signed)

## 2013-04-02 NOTE — Patient Instructions (Signed)
Please continue all other medications as before, and refills have been done if requested. Please have the pharmacy call with any other refills you may need. Please continue your efforts at being more active, low cholesterol diet, and weight control. You are otherwise up to date with prevention measures today.  No further labs or EKG needed today  Please remember to sign up for My Chart if you have not done so, as this will be important to you in the future with finding out test results, communicating by private email, and scheduling acute appointments online when needed.  Please return in 1 year for your yearly visit, or sooner if needed, with Lab testing done 3-5 days before
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
## 2013-04-02 NOTE — Progress Notes (Signed)
Pre-visit discussion using our clinic review tool. No additional management support is needed unless otherwise documented below in the visit note.  

## 2013-04-24 HISTORY — PX: CYSTOSCOPY/RETROGRADE/URETEROSCOPY/STONE EXTRACTION WITH BASKET: SHX5317

## 2013-06-20 ENCOUNTER — Encounter: Payer: 59 | Admitting: Internal Medicine

## 2013-07-05 ENCOUNTER — Ambulatory Visit: Payer: 59

## 2013-07-05 ENCOUNTER — Ambulatory Visit (INDEPENDENT_AMBULATORY_CARE_PROVIDER_SITE_OTHER): Payer: 59 | Admitting: Emergency Medicine

## 2013-07-05 VITALS — BP 120/74 | HR 84 | Temp 97.8°F | Resp 18 | Wt 196.0 lb

## 2013-07-05 DIAGNOSIS — M25559 Pain in unspecified hip: Secondary | ICD-10-CM

## 2013-07-05 DIAGNOSIS — M7070 Other bursitis of hip, unspecified hip: Secondary | ICD-10-CM

## 2013-07-05 DIAGNOSIS — M76899 Other specified enthesopathies of unspecified lower limb, excluding foot: Secondary | ICD-10-CM

## 2013-07-05 NOTE — Patient Instructions (Signed)
Hip Pain  The hips join the upper legs to the lower pelvis. The bones, cartilage, tendons, and muscles of the hip joint perform a lot of work each day holding your body weight and allowing you to move around.  Hip pain is a common symptom. It can range from a minor ache to severe pain on 1 or both hips. Pain may be felt on the inside of the hip joint near the groin, or the outside near the buttocks and upper thigh. There may be swelling or stiffness as well. It occurs more often when a person walks or performs activity. There are many reasons hip pain can develop.  CAUSES   It is important to work with your caregiver to identify the cause since many conditions can impact the bones, cartilage, muscles, and tendons of the hips. Causes for hip pain include:   Broken (fractured) bones.   Separation of the thighbone from the hip socket (dislocation).   Torn cartilage of the hip joint.   Swelling (inflammation) of a tendon (tendonitis), the sac within the hip joint (bursitis), or a joint.   A weakening in the abdominal wall (hernia), affecting the nerves to the hip.   Arthritis in the hip joint or lining of the hip joint.   Pinched nerves in the back, hip, or upper thigh.   A bulging disc in the spine (herniated disc).   Rarely, bone infection or cancer.  DIAGNOSIS   The location of your hip pain will help your caregiver understand what may be causing the pain. A diagnosis is based on your medical history, your symptoms, results from your physical exam, and results from diagnostic tests. Diagnostic tests may include X-ray exams, a computerized magnetic scan (magnetic resonance imaging, MRI), or bone scan.  TREATMENT   Treatment will depend on the cause of your hip pain. Treatment may include:   Limiting activities and resting until symptoms improve.   Crutches or other walking supports (a cane or brace).   Ice, elevation, and compression.   Physical therapy or home exercises.   Shoe inserts or special  shoes.   Losing weight.   Medications to reduce pain.   Undergoing surgery.  HOME CARE INSTRUCTIONS    Only take over-the-counter or prescription medicines for pain, discomfort, or fever as directed by your caregiver.   Put ice on the injured area:   Put ice in a plastic bag.   Place a towel between your skin and the bag.   Leave the ice on for 15-20 minutes at a time, 03-04 times a day.   Keep your leg raised (elevated) when possible to lessen swelling.   Avoid activities that cause pain.   Follow specific exercises as directed by your caregiver.   Sleep with a pillow between your legs on your most comfortable side.   Record how often you have hip pain, the location of the pain, and what it feels like. This information may be helpful to you and your caregiver.   Ask your caregiver about returning to work or sports and whether you should drive.   Follow up with your caregiver for further exams, therapy, or testing as directed.  SEEK MEDICAL CARE IF:    Your pain or swelling continues or worsens after 1 week.   You are feeling unwell or have chills.   You have increasing difficulty with walking.   You have a loss of sensation or other new symptoms.   You have questions or concerns.  SEEK   IMMEDIATE MEDICAL CARE IF:    You cannot put weight on the affected hip.   You have fallen.   You have a sudden increase in pain and swelling in your hip.   You have a fever.  MAKE SURE YOU:    Understand these instructions.   Will watch your condition.   Will get help right away if you are not doing well or get worse.  Document Released: 09/28/2009 Document Revised: 07/03/2011 Document Reviewed: 09/28/2009  [employer] Patient Information 2014 [employer]
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
## 2013-07-12 ENCOUNTER — Ambulatory Visit: Payer: 59

## 2013-07-12 ENCOUNTER — Ambulatory Visit (INDEPENDENT_AMBULATORY_CARE_PROVIDER_SITE_OTHER): Payer: 59 | Admitting: Emergency Medicine

## 2013-07-12 VITALS — BP 110/70 | HR 101 | Temp 98.9°F | Resp 24 | Ht 64.0 in | Wt 193.0 lb

## 2013-07-12 DIAGNOSIS — R062 Wheezing: Secondary | ICD-10-CM

## 2013-07-12 DIAGNOSIS — R05 Cough: Secondary | ICD-10-CM

## 2013-07-12 DIAGNOSIS — R509 Fever, unspecified: Secondary | ICD-10-CM

## 2013-07-12 DIAGNOSIS — R059 Cough, unspecified: Secondary | ICD-10-CM

## 2013-07-12 MED ORDER — HYDROCODONE-HOMATROPINE 5-1.5 MG/5ML PO SYRP: 5.0000 mL | ORAL_SOLUTION | Freq: Three times a day (TID) | ORAL | Status: DC | PRN

## 2013-07-12 MED ORDER — IPRATROPIUM BROMIDE 0.02 % IN SOLN
0.5000 mg | Freq: Once | RESPIRATORY_TRACT | Status: AC
  Administered 2013-07-12: 0.5 mg via RESPIRATORY_TRACT

## 2013-07-12 MED ORDER — AZITHROMYCIN 250 MG PO TABS: ORAL_TABLET | ORAL | Status: DC

## 2013-07-12 MED ORDER — PREDNISONE 20 MG PO TABS: ORAL_TABLET | ORAL | Status: DC

## 2013-07-12 MED ORDER — BENZONATATE 100 MG PO CAPS: 100.0000 mg | ORAL_CAPSULE | Freq: Three times a day (TID) | ORAL | Status: DC | PRN

## 2013-07-12 MED ORDER — ALBUTEROL SULFATE (2.5 MG/3ML) 0.083% IN NEBU
2.5000 mg | INHALATION_SOLUTION | Freq: Once | RESPIRATORY_TRACT | Status: AC
  Administered 2013-07-12: 2.5 mg via RESPIRATORY_TRACT

## 2013-07-12 NOTE — Patient Instructions (Signed)
The antibiotic prescribed today is for your present infection only. It is very important to follow the directions for the medication prescribed. Antibiotics are generally given for a specified period of time (7-10 days, for example) to be taken at specific intervals (every 4, 6, 8 or 12 hours). This is necessary to keep the right amount of the medication in the bloodstream. Too much of the medication may cause an adverse reaction, too little may not be completely effective.  To clear your infection completely, continue taking the antibiotic for the full time of treatment, even if you begin to feel better after a few days.  If you miss a dose of the antibiotic, take it as soon as possible. Then go back to your regular dosing schedule. However, don't double up doses.     (1)  Start taking the azithromycin today - finish the full   (2)  Start the prednisone (steroid) today - take as directed and finish the full course  (3)  Use the benzonatate (Tessalon Perles) every 8 hours as needed for cough  (4)  Use the Hycodan syrup if needed - this may make you sleepy  (5)  Use your inhaler every 4 hours for the next couple of days and then as needed  (6)  Continue taking the Mucinex - you can take up to 1200mg  twice daily  Plenty of fluids (water is best!) and rest  Please let us know if any symptoms are worsening or not improving

## 2013-07-12 NOTE — Progress Notes (Signed)
Subjective:    Patient ID: [name], female    DOB: [birth year], 58 y.o.   MRN: [id number]  HPI   [name] is a very pleasant 58 yr old female here with concern for illness.  Reports worsening cough over the last 7 days.  Productive of thick yellow sputum.  Has had occ fevers to 101F.  Coughs so hard she vomits.  Now also with nasal/sinus congestion.  Has used mucinex with some relief.  She has noted wheezing and has used albuterol inhaler  Seen here one week ago for Right hip pain.  Thinks pain is worsening.  Has appt with ortho for early April   Review of Systems  Constitutional: Positive for fever.  HENT: Positive for congestion, rhinorrhea and sinus pressure. Negative for ear pain.   Respiratory: Positive for cough, shortness of breath and wheezing.   Cardiovascular: Negative.   Gastrointestinal: Negative.   Musculoskeletal: Positive for arthralgias.  Skin: Negative.        Objective:   Physical Exam  Vitals reviewed. Constitutional: She is oriented to person, place, and time. She appears well-developed and well-nourished. No distress.  HENT:  Head: Normocephalic and atraumatic.  Right Ear: Tympanic membrane and ear canal normal.  Left Ear: Tympanic membrane and ear canal normal.  Nose: Mucosal edema and rhinorrhea present.  Mouth/Throat: Uvula is midline, oropharynx is clear and moist and mucous membranes are normal.  Eyes: Conjunctivae are normal. No scleral icterus.  Neck: Neck supple.  Cardiovascular: Normal rate, regular rhythm and normal heart sounds.   Pulmonary/Chest: Effort normal. She has no decreased breath sounds. She has wheezes (throughout). She has no rhonchi. She has no rales.  Lymphadenopathy:    She has no cervical adenopathy.  Neurological: She is alert and oriented to person, place, and time.  Skin: Skin is warm and dry.  Psychiatric: She has a normal mood and affect. Her behavior is normal.    [facility] reading (PRIMARY) by  Dr.Daub - heavy  basilar markings, no consolidation   Duoneb x 1 - subjectively with little relief, but objectively wheezes are MUCH improved     Assessment & Plan:  Cough - Plan: albuterol (PROVENTIL) (2.5 MG/3ML) 0.083% nebulizer solution 2.5 mg, ipratropium (ATROVENT) nebulizer solution 0.5 mg, DG Chest 2 View, HYDROcodone-homatropine (HYCODAN) 5-1.5 MG/5ML syrup, benzonatate (TESSALON) 100 MG capsule, predniSONE (DELTASONE) 20 MG tablet, azithromycin (ZITHROMAX) 250 MG tablet  Wheezing - Plan: albuterol (PROVENTIL) (2.5 MG/3ML) 0.083% nebulizer solution 2.5 mg, ipratropium (ATROVENT) nebulizer solution 0.5 mg, DG Chest 2 View, predniSONE (DELTASONE) 20 MG tablet, azithromycin (ZITHROMAX) 250 MG tablet  Fever, unspecified - Plan: DG Chest 2 View, azithromycin (ZITHROMAX) 250 MG tablet   [name] is a very pleasant 58 yr old female with cough, wheezing, and fever.  On exam she had loud wheezes throughout but this improved significantly with albuterol/atrovent neb.  There are no rales or rhonchi.  She is afebrile at this time.  CXR shows increased basilar markings but is negative for infiltrate or consolidation.  Will treat with azithro and prednisone taper.  Suspect prednisone will also improve her hip pain.  Tessalon and Hycodan for cough.  Continue Mucinex.  Push fluids, rest.  If pt needs work note for Monday, will call and we can print for her.  Hopeful that she will be improved by then  Pt to call or RTC if worsening or not improving  E. Natividad Brood MHS, PA-C [facility]  3/21/20155:02 PM

## 2013-07-28 ENCOUNTER — Other Ambulatory Visit: Payer: Self-pay | Admitting: Internal Medicine

## 2013-08-11 ENCOUNTER — Other Ambulatory Visit: Payer: Self-pay | Admitting: Internal Medicine

## 2013-08-14 ENCOUNTER — Other Ambulatory Visit: Payer: Self-pay | Admitting: Internal Medicine

## 2013-08-26 ENCOUNTER — Encounter: Payer: Self-pay | Admitting: Internal Medicine

## 2013-08-26 MED ORDER — METRONIDAZOLE 250 MG PO TABS: 250.0000 mg | ORAL_TABLET | Freq: Three times a day (TID) | ORAL | Status: DC

## 2013-08-26 MED ORDER — CEFUROXIME AXETIL 250 MG PO TABS
250.0000 mg | ORAL_TABLET | Freq: Two times a day (BID) | ORAL | Status: DC
Start: 2013-08-26 — End: 2013-12-30

## 2013-08-26 NOTE — Telephone Encounter (Signed)
robin to call pt - antibx sent, ok to see pt tomorrow as planned

## 2013-08-28 ENCOUNTER — Encounter: Payer: Self-pay | Admitting: Internal Medicine

## 2013-08-28 ENCOUNTER — Ambulatory Visit (INDEPENDENT_AMBULATORY_CARE_PROVIDER_SITE_OTHER): Payer: 59 | Admitting: Internal Medicine

## 2013-08-28 VITALS — BP 120/82 | HR 90 | Temp 98.4°F | Ht 64.5 in | Wt 197.4 lb

## 2013-08-28 DIAGNOSIS — Z87442 Personal history of urinary calculi: Secondary | ICD-10-CM

## 2013-08-28 DIAGNOSIS — R7302 Impaired glucose tolerance (oral): Secondary | ICD-10-CM

## 2013-08-28 DIAGNOSIS — K5289 Other specified noninfective gastroenteritis and colitis: Secondary | ICD-10-CM

## 2013-08-28 DIAGNOSIS — R7309 Other abnormal glucose: Secondary | ICD-10-CM

## 2013-08-28 DIAGNOSIS — K529 Noninfective gastroenteritis and colitis, unspecified: Secondary | ICD-10-CM

## 2013-08-28 NOTE — Patient Instructions (Addendum)
Please continue and finish your current antibiotics  Please continue all other medications as before, and refills have been done if requested. Please have the pharmacy call with any other refills you may need.  You are given the office note information from you last 2 Urology visits  We will call in AM for the specific U/S and CT scan results  We may need to consider GI referral if the colitis does not resolve
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
## 2013-08-28 NOTE — Progress Notes (Signed)
Pre visit review using our clinic review tool, if applicable. No additional management support is needed unless otherwise documented below in the visit note. 

## 2013-08-29 ENCOUNTER — Telehealth: Payer: Self-pay | Admitting: Internal Medicine

## 2013-08-29 ENCOUNTER — Encounter: Payer: Self-pay | Admitting: Internal Medicine

## 2013-08-29 NOTE — Telephone Encounter (Signed)
Pt has seen the My Chart message.  She is demanding that Dr. Jenny Reichmann call her on her cell phone.  She has questions.  Would not elaborate.  Says Dr. Jenny Reichmann told her he would call her.

## 2013-08-29 NOTE — Telephone Encounter (Signed)
I did not promise to call, only that I would follow up on the results and let her know, which I have done.  I have nothing else to offer at this time, and the treatment should continue as we discussed.  Please let me know if pt have other specific questions I can answer

## 2013-08-30 DIAGNOSIS — K529 Noninfective gastroenteritis and colitis, unspecified: Secondary | ICD-10-CM | POA: Insufficient documentation

## 2013-08-30 NOTE — Assessment & Plan Note (Signed)
Clinically improved early on after start empiric tx cipro/flagyl, exam suggests rather significant prob ascending colitis, no vomiting/high fever/significant bowel change or blood and pain some improved but still at least mod today; to cont same tx for now, call for CT results from [facility], pain control (declines rx today), follow clinically; hold on further labs as showing early improvement, consider Gi referral if does not further improve, does not appear to need further imaging or colonoscopy

## 2013-08-30 NOTE — Assessment & Plan Note (Signed)
None recent urology eval,  to f/u any worsening symptoms or concerns

## 2013-08-30 NOTE — Assessment & Plan Note (Signed)
stable overall by history and exam,  and pt to continue medical treatment as before,  to f/u any worsening symptoms or concerns Lab Results  Component Value Date   HGBA1C 5.5 04/17/2011

## 2013-09-01 NOTE — Telephone Encounter (Signed)
LMOM to return call.

## 2013-09-03 ENCOUNTER — Telehealth: Payer: Self-pay | Admitting: Internal Medicine

## 2013-09-03 NOTE — Telephone Encounter (Signed)
Not sure how to respond on mychart from this message  No way of knowing the exact type of colitis, but most likely bacterial, and she was given the usual treatment  The only to lose wt is for less calories in, and more calories out, which usually means less food intake (especially fatty foods), and more exercise.  If no wt loss yet, she is simply not getting enough exercise yet to make it happen.

## 2013-09-03 NOTE — Telephone Encounter (Signed)
LMOM to call if has specific questions.

## 2013-09-03 NOTE — Telephone Encounter (Signed)
Patient informed of MD's response to question.  The patient stated she has had a problem with fluid retention since before having colitis and she is taking her fluid pill.  She had been going to the gym and drinking shakeology shakes.  Also is having some vaginal discharge and may have a yeast infection, she did pickup something OTC from [facility]

## 2013-09-03 NOTE — Telephone Encounter (Signed)
Called left message to call back 

## 2013-09-03 NOTE — Telephone Encounter (Signed)
Ok,noted

## 2013-09-03 NOTE — Telephone Encounter (Signed)
Pt wants to know what kind of colitis showed up on the CT. She is still taking the antibiotic. Her weight is going up, to 197 lbs. Today.  She is dieting, yet gaining. Ok to respond on my chart.

## 2013-12-30 ENCOUNTER — Ambulatory Visit (INDEPENDENT_AMBULATORY_CARE_PROVIDER_SITE_OTHER): Payer: 59

## 2013-12-30 ENCOUNTER — Ambulatory Visit (INDEPENDENT_AMBULATORY_CARE_PROVIDER_SITE_OTHER): Payer: 59 | Admitting: Family Medicine

## 2013-12-30 VITALS — BP 124/80 | HR 86 | Temp 97.4°F | Resp 18 | Ht 64.5 in | Wt 202.4 lb

## 2013-12-30 DIAGNOSIS — R110 Nausea: Secondary | ICD-10-CM

## 2013-12-30 DIAGNOSIS — R109 Unspecified abdominal pain: Secondary | ICD-10-CM

## 2013-12-30 DIAGNOSIS — R1032 Left lower quadrant pain: Secondary | ICD-10-CM

## 2013-12-30 LAB — POCT CBC
Granulocyte percent: 69.9 %G (ref 37–80)
HCT, POC: 42.9 % (ref 37.7–47.9)
Hemoglobin: 13.9 g/dL (ref 12.2–16.2)
Lymph, poc: 1.7 (ref 0.6–3.4)
MCH, POC: 31.2 pg (ref 27–31.2)
MCHC: 32.5 g/dL (ref 31.8–35.4)
MCV: 95.8 fL (ref 80–97)
MID (cbc): 0.4 (ref 0–0.9)
MPV: 7 fL (ref 0–99.8)
POC Granulocyte: 4.7 (ref 2–6.9)
POC LYMPH PERCENT: 24.8 %L (ref 10–50)
POC MID %: 5.3 %M (ref 0–12)
Platelet Count, POC: 237 10*3/uL (ref 142–424)
RBC: 4.47 M/uL (ref 4.04–5.48)
RDW, POC: 13.2 %
WBC: 6.7 10*3/uL (ref 4.6–10.2)

## 2013-12-30 LAB — POCT URINALYSIS DIPSTICK
Bilirubin, UA: NEGATIVE
Glucose, UA: NEGATIVE
Ketones, UA: NEGATIVE
Leukocytes, UA: NEGATIVE
Nitrite, UA: NEGATIVE
Protein, UA: NEGATIVE
Spec Grav, UA: <=1.005
Urobilinogen, UA: 0.2
pH, UA: 5.5

## 2013-12-30 LAB — BASIC METABOLIC PANEL
BUN: 10 mg/dL (ref 6–23)
CO2: 28 mEq/L (ref 19–32)
Calcium: 9.5 mg/dL (ref 8.4–10.5)
Chloride: 106 mEq/L (ref 96–112)
Creat: 0.9 mg/dL (ref 0.50–1.10)
Glucose, Bld: 85 mg/dL (ref 70–99)
Potassium: 4.2 mEq/L (ref 3.5–5.3)
Sodium: 143 mEq/L (ref 135–145)

## 2013-12-30 LAB — POCT UA - MICROSCOPIC ONLY
Bacteria, U Microscopic: NEGATIVE
Casts, Ur, LPF, POC: NEGATIVE
Crystals, Ur, HPF, POC: NEGATIVE
Mucus, UA: NEGATIVE
WBC, Ur, HPF, POC: NEGATIVE
Yeast, UA: NEGATIVE

## 2013-12-30 MED ORDER — TAMSULOSIN HCL 0.4 MG PO CAPS: 0.4000 mg | ORAL_CAPSULE | Freq: Every day | ORAL | Status: DC

## 2013-12-30 MED ORDER — KETOROLAC TROMETHAMINE 10 MG PO TABS: 10.0000 mg | ORAL_TABLET | Freq: Four times a day (QID) | ORAL | Status: DC | PRN

## 2013-12-30 MED ORDER — ONDANSETRON HCL 4 MG/2ML IJ SOLN: 8.0000 mg | Freq: Once | INTRAMUSCULAR | Status: DC

## 2013-12-30 MED ORDER — ONDANSETRON 8 MG PO TBDP: 8.0000 mg | ORAL_TABLET | Freq: Three times a day (TID) | ORAL | Status: DC | PRN

## 2013-12-30 MED ORDER — KETOROLAC TROMETHAMINE 60 MG/2ML IM SOLN
60.0000 mg | Freq: Once | INTRAMUSCULAR | Status: AC
  Administered 2013-12-30: 60 mg via INTRAMUSCULAR

## 2013-12-30 MED ORDER — OXYCODONE-ACETAMINOPHEN 5-325 MG PO TABS: 1.0000 | ORAL_TABLET | Freq: Three times a day (TID) | ORAL | Status: DC | PRN

## 2013-12-30 MED ORDER — ONDANSETRON 4 MG PO TBDP
8.0000 mg | ORAL_TABLET | Freq: Once | ORAL | Status: AC
  Administered 2013-12-30: 8 mg via ORAL

## 2013-12-30 NOTE — Patient Instructions (Signed)
If you develop any symptoms of worsening symptoms of nausea and pain then we should do a CT scan to see how big the kidney stone is and see if it is a size that you can pass on your own or if you have to see your urologist for this.  Please call if your pain is getting worse or does not seem to be moving so we can order this.  If you develop any illness with fever/chills, feeling sick, come back to clinic IMMEDIATELY as this could be an emergency.  Kidney Stones Kidney stones (urolithiasis) are deposits that form inside your kidneys. The intense pain is caused by the stone moving through the urinary tract. When the stone moves, the ureter goes into spasm around the stone. The stone is usually passed in the urine.  CAUSES   A disorder that makes certain neck glands produce too much parathyroid hormone (primary hyperparathyroidism).  A buildup of uric acid crystals, similar to gout in your joints.  Narrowing (stricture) of the ureter.  A kidney obstruction present at birth (congenital obstruction).  Previous surgery on the kidney or ureters.  Numerous kidney infections. SYMPTOMS   Feeling sick to your stomach (nauseous).  Throwing up (vomiting).  Blood in the urine (hematuria).  Pain that usually spreads (radiates) to the groin.  Frequency or urgency of urination. DIAGNOSIS   Taking a history and physical exam.  Blood or urine tests.  CT scan.  Occasionally, an examination of the inside of the urinary bladder (cystoscopy) is performed. TREATMENT   Observation.  Increasing your fluid intake.  Extracorporeal shock wave lithotripsy--This is a noninvasive procedure that uses shock waves to break up kidney stones.  Surgery may be needed if you have severe pain or persistent obstruction. There are various surgical procedures. Most of the procedures are performed with the use of small instruments. Only small incisions are needed to accommodate these instruments, so recovery time  is minimized. The size, location, and chemical composition are all important variables that will determine the proper choice of action for you. Talk to your health care provider to better understand your situation so that you will minimize the risk of injury to yourself and your kidney.  HOME CARE INSTRUCTIONS   Drink enough water and fluids to keep your urine clear or pale yellow. This will help you to pass the stone or stone fragments.  Strain all urine through the provided strainer. Keep all particulate matter and stones for your health care provider to see. The stone causing the pain may be as small as a grain of salt. It is very important to use the strainer each and every time you pass your urine. The collection of your stone will allow your health care provider to analyze it and verify that a stone has actually passed. The stone analysis will often identify what you can do to reduce the incidence of recurrences.  Only take over-the-counter or prescription medicines for pain, discomfort, or fever as directed by your health care provider.  Make a follow-up appointment with your health care provider as directed.  Get follow-up X-rays if required. The absence of pain does not always mean that the stone has passed. It may have only stopped moving. If the urine remains completely obstructed, it can cause loss of kidney function or even complete destruction of the kidney. It is your responsibility to make sure X-rays and follow-ups are completed. Ultrasounds of the kidney can show blockages and the status of the kidney. Ultrasounds  are not associated with any radiation and can be performed easily in a matter of minutes. SEEK MEDICAL CARE IF:  You experience pain that is progressive and unresponsive to any pain medicine you have been prescribed. SEEK IMMEDIATE MEDICAL CARE IF:   Pain cannot be controlled with the prescribed medicine.  You have a fever or shaking chills.  The severity or  intensity of pain increases over 18 hours and is not relieved by pain medicine.  You develop a new onset of abdominal pain.  You feel faint or pass out.  You are unable to urinate. MAKE SURE YOU:   Understand these instructions.  Will watch your condition.  Will get help right away if you are not doing well or get worse. Document Released: 04/10/2005 Document Revised: 12/11/2012 Document Reviewed: 09/11/2012 [facility] Patient Information 2015 [place], [place]. This information is not intended to replace advice given to you by your health care provider. Make sure you discuss any questions you have with your health care provider.
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
## 2013-12-31 LAB — URINE CULTURE: Colony Count: 30000

## 2014-01-29 ENCOUNTER — Other Ambulatory Visit (HOSPITAL_COMMUNITY): Payer: Self-pay | Admitting: Obstetrics and Gynecology

## 2014-01-29 DIAGNOSIS — Z1231 Encounter for screening mammogram for malignant neoplasm of breast: Secondary | ICD-10-CM

## 2014-02-03 ENCOUNTER — Encounter: Payer: Self-pay | Admitting: Internal Medicine

## 2014-02-17 ENCOUNTER — Encounter: Payer: Self-pay | Admitting: Internal Medicine

## 2014-03-24 IMAGING — CT CT ANGIO CHEST
1 of 3 series · 17 of 32 positions shown · IV contrast (OMNIPAQUE 300)
Comparison: Prior CT abdomen/pelvis 04/09/2011; prior CT chest
11/06/2007

CTA CHEST

CLINICAL DATA: Sudden onset chest pain, evaluate for aortic
dissection

CT ANGIOGRAPHY CHEST, ABDOMEN AND PELVIS
TECHNIQUE: Multidetector CT imaging through the chest, abdomen and
pelvis was performed using the standard protocol during bolus
administration of intravenous contrast.  Multiplanar reconstructed
images including MIPs were obtained and reviewed to evaluate the
vascular anatomy.
Contrast: 100mL OMNIPAQUE IOHEXOL 350 MG/ML SOLN

[Series 7: arterial 3.0 b30f · axial · arterial · 0.68mm/px · z∈[+1020,+1545]mm · 17 of 195 slices shown]
[im 10/195  lung]
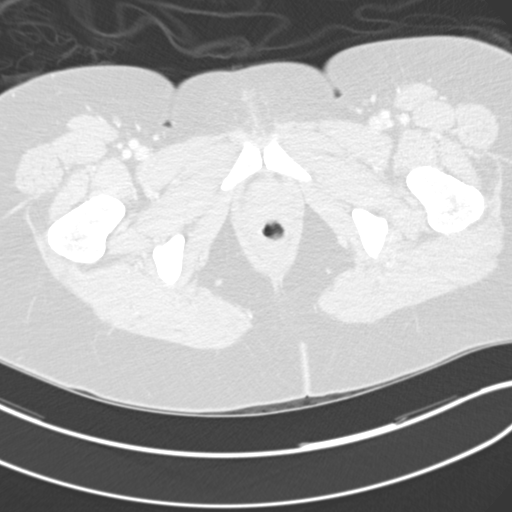
[im 20/195  soft-tissue]
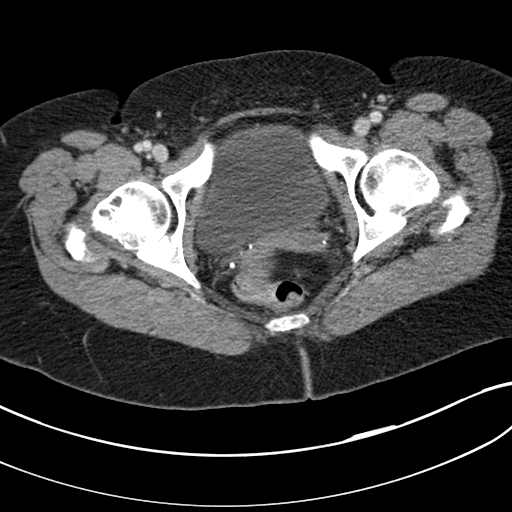
[im 30/195  lung]
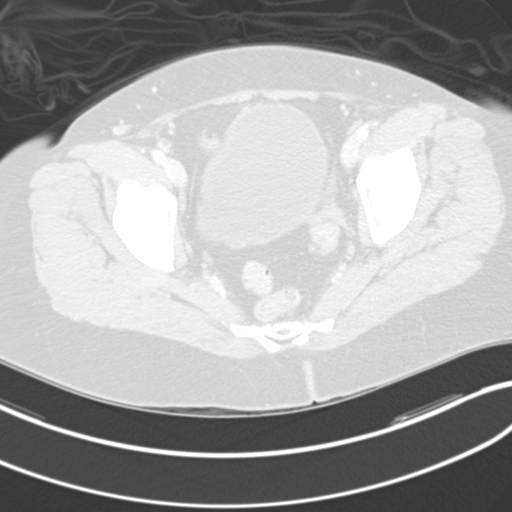
[im 39/195  soft-tissue]
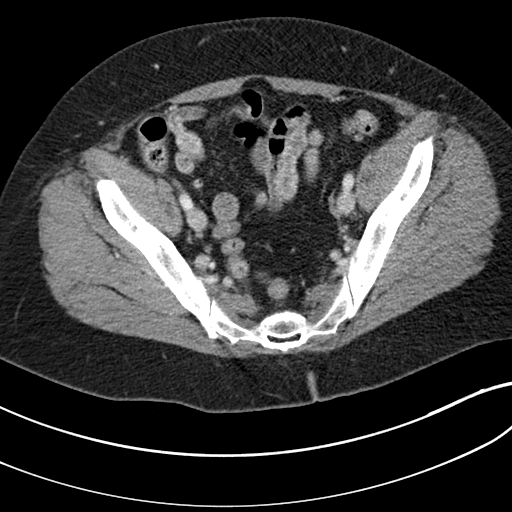
[im 59/195  lung]
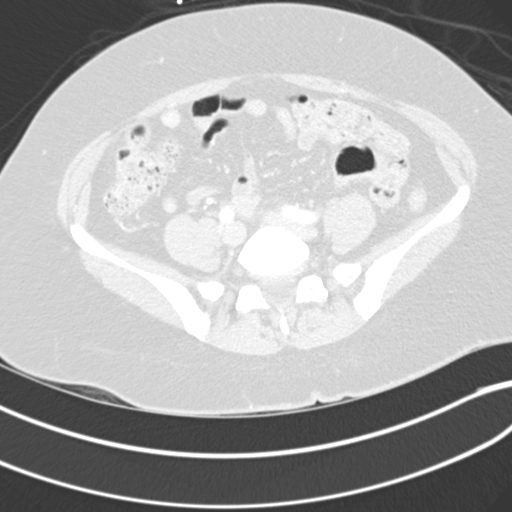
[im 68/195  soft-tissue]
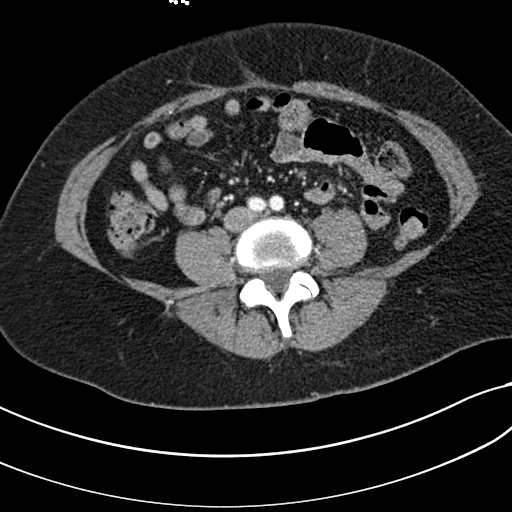
[im 78/195  lung]
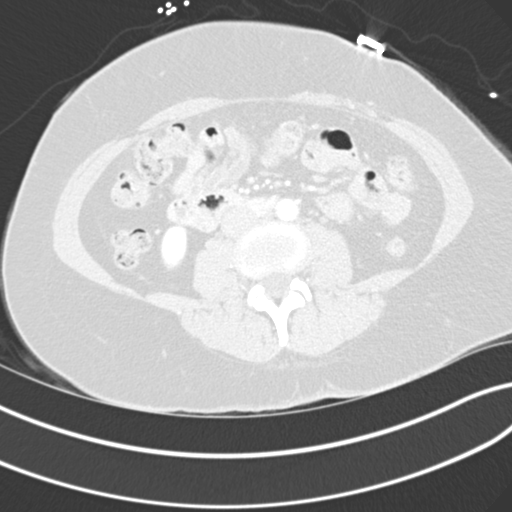
[im 88/195  soft-tissue]
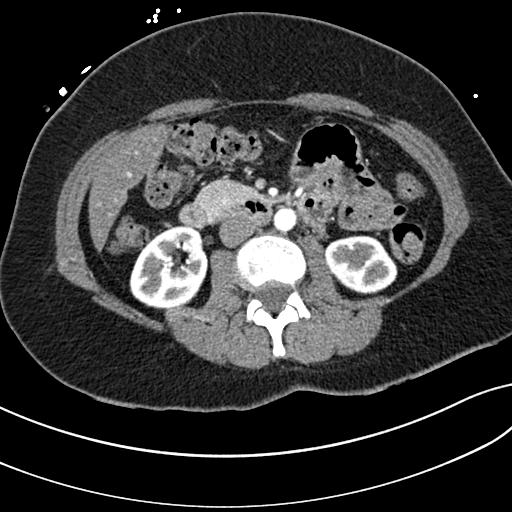
[im 98/195  lung]
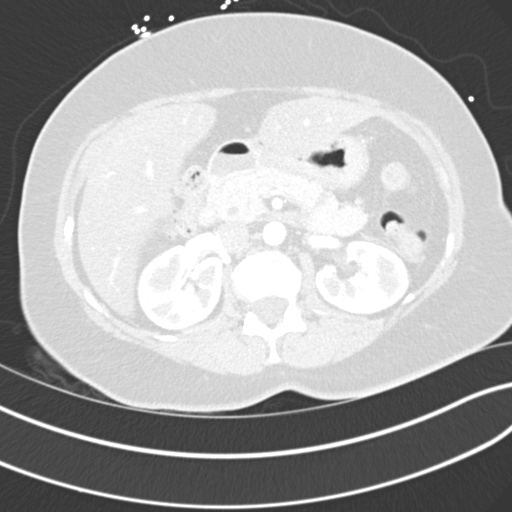
[im 107/195  soft-tissue]
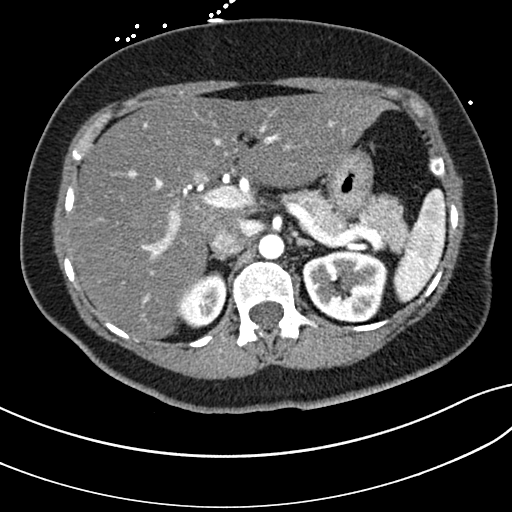
[im 117/195  lung]
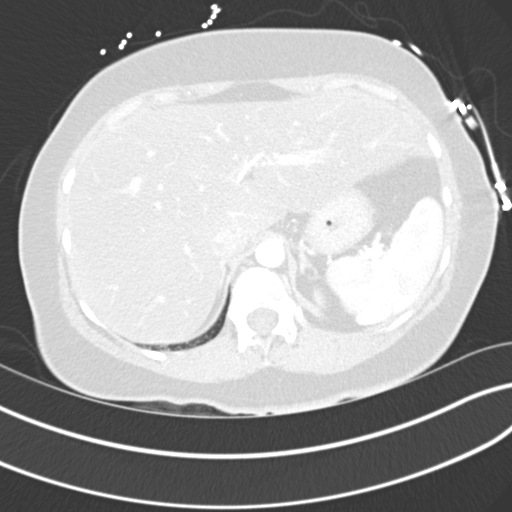
[im 127/195  soft-tissue]
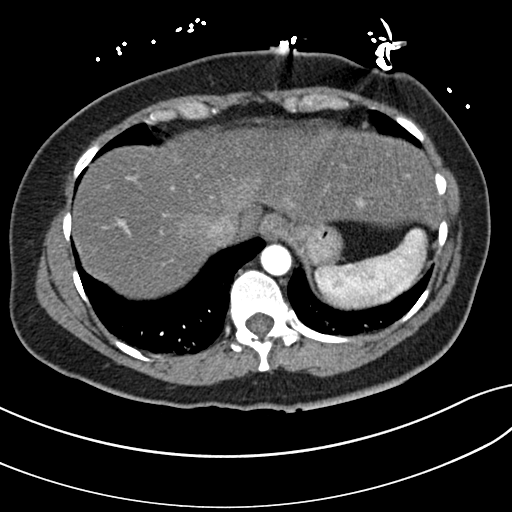
[im 136/195  lung]
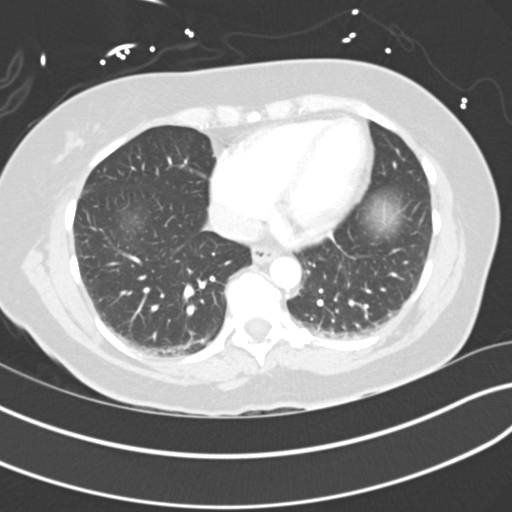
[im 156/195  soft-tissue]
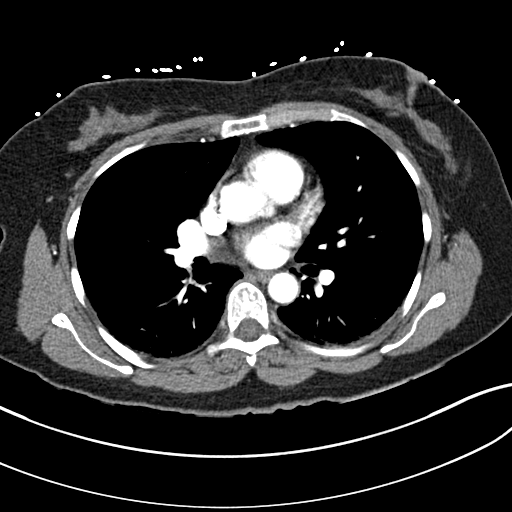
[im 165/195  lung]
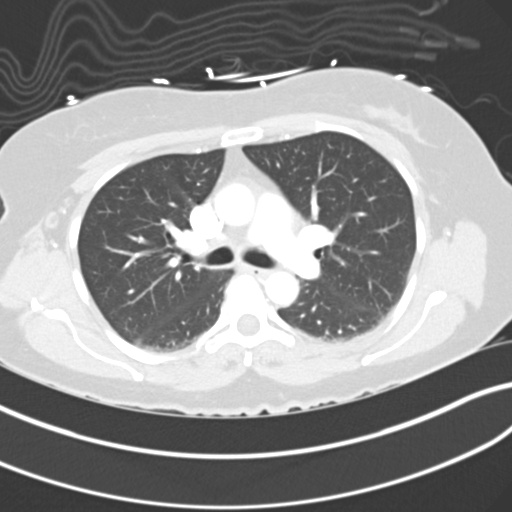
[im 175/195  soft-tissue]
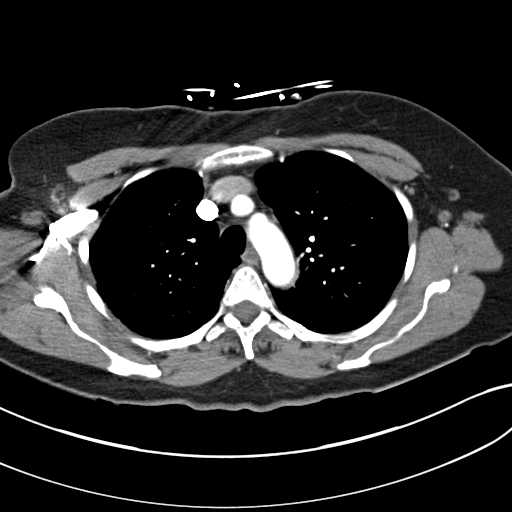
[im 185/195  lung]
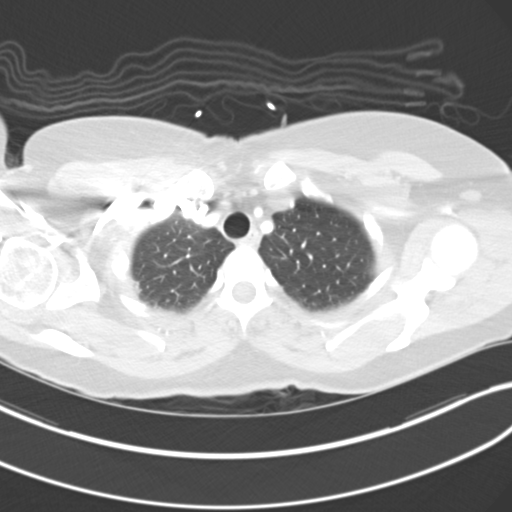

[17 of 32 positions shown; findings below may reference images not displayed]

FINDINGS: Mediastinum: Unremarkable CT appearance of the thyroid gland.
Prominent right posterosuperior pericardial reflection incidentally
noted.  Prominent right hilar nodal tissue remains unchanged at 9 x
19 mm.  No soft tissue mediastinal mass.  The thoracic esophagus is
unremarkable.

Heart/Vascular: There is a bovine configuration of the aortic arch
(two vessel arch with common origin of the brachiocephalic and left
common carotid arteries), a normal anatomic variant.  No evidence
of acute dissection or aneurysmal dilatation.  Heart is within
normal limits for size.  No pericardial effusion.  An embolism
identified within the well opacified pulmonary arteries.

Lungs/Pleura: Trace dependent atelectasis in the lower lobes.
Otherwise, the lungs are clear.

Bones: No acute fracture or aggressive appearing lytic or blastic
osseous lesion.

 Review of the MIP images confirms the above findings.
IMPRESSION: No acute cardiopulmonary abnormality.  Specifically, no evidence of
thoracic aortic dissection or intramural hematoma.

CTA ABDOMEN AND PELVIS
FINDINGS: VASCULAR

Aorta: Normal caliber abdominal aorta without significant
atherosclerotic vascular disease, aneurysmal dilatation or
dissection.

Celiac: Had patent with conventional hepatic arterial anatomy.

SMA: Widely patent.

Renals: Single dominant renal arteries bilaterally which are
patent.  No evidence of fibromuscular dysplasia or dissection.

IMA: Widely patent.

Inflow: No significant atherosclerotic vascular disease.

Proximal Outflow: No significant atherosclerotic vascular disease.

Veins: Given the limitations of non venous phase timing, no
significant venous abnormality identified.

NON-VASCULAR

Abdomen: Unremarkable CT appearance of the stomach, duodenum,
spleen, adrenal glands and pancreas.  Diffuse low attenuation of
the hepatic parenchyma consistent with advanced fatty infiltration.
No focal lesion identified.  The portal veins are patent.  Surgical
changes of prior cholecystectomy.  No intra or extrahepatic biliary
ductal dilatation.

Unremarkable CT appearance of the kidneys without evidence of
hydronephrosis, enhancing solid mass or nephrolithiasis.

Normal-caliber large and small bowel throughout the abdomen.  No
evidence of obstruction.  Scattered diverticular disease in the
descending and sigmoid colon without evidence of inflammation to
suggest diverticulitis.  Normal appendix in the right lower
quadrant.  No free fluid or suspicious adenopathy.  Any fat
containing umbilical hernia.

Pelvis: [The bladder is distended with urine.  Surgical changes of
prior hysterectomy.  Bilateral adnexa are unremarkable.  No free
fluid or suspicious adenopathy....]

Bones: No acute fracture or aggressive appearing lytic or blastic
osseous lesion.

 Review of the MIP images confirms the above findings.
IMPRESSION: 1.  No acute abnormality in the abdomen or pelvis.
2.  Borderline hepatomegaly with extensive hepatic steatosis.
Recommend clinical correlation with serum LFTs.

3.  Surgical changes of prior cholecystectomy and hysterectomy.
4.  Descending and sigmoid colonic diverticulosis.

## 2014-03-25 ENCOUNTER — Ambulatory Visit (INDEPENDENT_AMBULATORY_CARE_PROVIDER_SITE_OTHER): Payer: 59 | Admitting: Podiatrist

## 2014-03-25 ENCOUNTER — Ambulatory Visit (INDEPENDENT_AMBULATORY_CARE_PROVIDER_SITE_OTHER): Payer: 59

## 2014-03-25 ENCOUNTER — Encounter: Payer: Self-pay | Admitting: Podiatrist

## 2014-03-25 VITALS — BP 106/67 | HR 78 | Resp 12

## 2014-03-25 DIAGNOSIS — M722 Plantar fascial fibromatosis: Secondary | ICD-10-CM

## 2014-03-25 NOTE — Progress Notes (Signed)
   Subjective:    Patient ID: [name], female    DOB: 06-01-1955, 58 y.o.   MRN: [id number]  HPI  PT STATED LT BOTTOM HEEL IS BEEN PAINFUL FOR 2 WEEKS. THE HEEL IS GETTING WORSE ESPECIALLY EVENING AND WALKING. TRIED TO SOAK WITH EPSON SALT, HEAT/ICE BUT NO HELP.  Review of Systems  Constitutional: Positive for unexpected weight change.  Cardiovascular: Positive for leg swelling.  Musculoskeletal: Positive for joint swelling and gait problem.  Hematological: Bruises/bleeds easily.  All other systems reviewed and are negative.      Objective:   Physical Exam  GENERAL APPEARANCE: Alert, conversant. Appropriately groomed. No acute distress.  VASCULAR: Pedal pulses palpable and strong bilateral.  Capillary refill time is immediate to all digits,  Proximal to distal cooling it warm to warm.  Digital hair growth is present bilateral  NEUROLOGIC: sensation is intact epicritically and protectively to 5.07 monofilament at 5/5 sites bilateral.  Light touch is intact bilateral, vibratory sensation intact bilateral, achilles tendon reflex is intact bilateral.  Negative Tinel sign is elicited MUSCULOSKELETAL: Pain on palpation plantar medial aspect left foot  at insertion of plantar fascia on the medial calcaneal tubercle. Inflammation at the insertion of the plantar fascia is present. Rectus foot type is seen. DERMATOLOGIC: skin color, texture, and turger are within normal limits.  No preulcerative lesions are seen, no interdigital maceration noted.  No open lesions present.  Digital nails are asymptomatic.      Assessment & Plan:  Plantar fasciitis left foot  Plan:  Discussed treatment options and at this time a plantar fascial injection was recommended.  The patient agreed and a sterile skin prep was applied.  An injection consisting of kenalog and marcaine mixture was infiltrated at the point of maximal tenderness on the left Heel.  The patient tolerated this well and was given  instructions for aftercare.

## 2014-03-25 NOTE — Patient Instructions (Signed)

## 2014-03-31 ENCOUNTER — Ambulatory Visit (HOSPITAL_COMMUNITY)
Admission: RE | Admit: 2014-03-31 | Discharge: 2014-03-31 | Disposition: A | Discharge status: Home / Self Care | Payer: 59 | Source: Ambulatory Visit | Attending: Obstetrics and Gynecology | Admitting: Obstetrics and Gynecology

## 2014-03-31 DIAGNOSIS — Z1231 Encounter for screening mammogram for malignant neoplasm of breast: Secondary | ICD-10-CM | POA: Diagnosis present

## 2014-03-31 LAB — HM MAMMOGRAPHY

## 2014-04-01 ENCOUNTER — Other Ambulatory Visit (INDEPENDENT_AMBULATORY_CARE_PROVIDER_SITE_OTHER): Payer: 59

## 2014-04-01 DIAGNOSIS — Z0000 Encounter for general adult medical examination without abnormal findings: Secondary | ICD-10-CM

## 2014-04-01 LAB — BASIC METABOLIC PANEL
BUN: 15 mg/dL (ref 6–23)
CO2: 26 mEq/L (ref 19–32)
Calcium: 9 mg/dL (ref 8.4–10.5)
Chloride: 100 mEq/L (ref 96–112)
Creatinine, Ser: 0.8 mg/dL (ref 0.4–1.2)
GFR: 78.74 mL/min (ref >60.00)
Glucose, Bld: 86 mg/dL (ref 70–99)
Potassium: 3.6 mEq/L (ref 3.5–5.1)
Sodium: 135 mEq/L (ref 135–145)

## 2014-04-01 LAB — CBC WITH DIFFERENTIAL/PLATELET
Basophils Absolute: 0.1 10*3/uL (ref 0.0–0.1)
Basophils Relative: 0.7 % (ref 0.0–3.0)
Eosinophils Absolute: 0.3 10*3/uL (ref 0.0–0.7)
Eosinophils Relative: 4.4 % (ref 0.0–5.0)
HCT: 44.7 % (ref 36.0–46.0)
Hemoglobin: 15.1 g/dL — ABNORMAL HIGH (ref 12.0–15.0)
Lymphocytes Relative: 27.6 % (ref 12.0–46.0)
Lymphs Abs: 2.1 10*3/uL (ref 0.7–4.0)
MCHC: 33.8 g/dL (ref 30.0–36.0)
MCV: 93.3 fl (ref 78.0–100.0)
Monocytes Absolute: 0.5 10*3/uL (ref 0.1–1.0)
Monocytes Relative: 6.3 % (ref 3.0–12.0)
Neutro Abs: 4.6 10*3/uL (ref 1.4–7.7)
Neutrophils Relative %: 61 % (ref 43.0–77.0)
Platelets: 264 10*3/uL (ref 150.0–400.0)
RBC: 4.8 Mil/uL (ref 3.87–5.11)
RDW: 13.3 % (ref 11.5–15.5)
WBC: 7.6 10*3/uL (ref 4.0–10.5)

## 2014-04-01 LAB — HEPATIC FUNCTION PANEL
ALT: 29 U/L (ref 0–35)
AST: 23 U/L (ref 0–37)
Albumin: 4.3 g/dL (ref 3.5–5.2)
Alkaline Phosphatase: 98 U/L (ref 39–117)
Bilirubin, Direct: 0.1 mg/dL (ref 0.0–0.3)
Total Bilirubin: 0.5 mg/dL (ref 0.2–1.2)
Total Protein: 7.5 g/dL (ref 6.0–8.3)

## 2014-04-01 LAB — URINALYSIS, ROUTINE W REFLEX MICROSCOPIC
Bilirubin Urine: NEGATIVE
Hgb urine dipstick: NEGATIVE
Ketones, ur: NEGATIVE
Leukocytes, UA: NEGATIVE
Nitrite: NEGATIVE
Specific Gravity, Urine: <=1.005 — AB (ref 1.000–1.030)
Total Protein, Urine: NEGATIVE
Urine Glucose: NEGATIVE
Urobilinogen, UA: 0.2 (ref 0.0–1.0)
pH: 6.5 (ref 5.0–8.0)

## 2014-04-01 LAB — LIPID PANEL
Cholesterol: 182 mg/dL (ref 0–200)
HDL: 63.7 mg/dL (ref >39.00)
LDL Cholesterol: 106 mg/dL — ABNORMAL HIGH (ref 0–99)
NonHDL: 118.3
Total CHOL/HDL Ratio: 3
Triglycerides: 61 mg/dL (ref 0.0–149.0)
VLDL: 12.2 mg/dL (ref 0.0–40.0)

## 2014-04-01 LAB — TSH: TSH: 7.23 u[IU]/mL — ABNORMAL HIGH (ref 0.35–4.50)

## 2014-04-02 ENCOUNTER — Encounter: Payer: Self-pay | Admitting: Internal Medicine

## 2014-04-07 ENCOUNTER — Ambulatory Visit (INDEPENDENT_AMBULATORY_CARE_PROVIDER_SITE_OTHER): Payer: 59 | Admitting: Internal Medicine

## 2014-04-07 VITALS — BP 104/64 | HR 92 | Temp 98.3°F | Ht 64.5 in | Wt 199.2 lb

## 2014-04-07 DIAGNOSIS — M545 Low back pain, unspecified: Secondary | ICD-10-CM

## 2014-04-07 DIAGNOSIS — G8929 Other chronic pain: Secondary | ICD-10-CM

## 2014-04-07 DIAGNOSIS — E039 Hypothyroidism, unspecified: Secondary | ICD-10-CM

## 2014-04-07 DIAGNOSIS — M79605 Pain in left leg: Secondary | ICD-10-CM

## 2014-04-07 DIAGNOSIS — M79604 Pain in right leg: Secondary | ICD-10-CM

## 2014-04-07 DIAGNOSIS — Z0000 Encounter for general adult medical examination without abnormal findings: Secondary | ICD-10-CM

## 2014-04-07 DIAGNOSIS — R7302 Impaired glucose tolerance (oral): Secondary | ICD-10-CM

## 2014-04-07 MED ORDER — LEVOTHYROXINE SODIUM 100 MCG PO TABS: 100.0000 ug | ORAL_TABLET | Freq: Every day | ORAL | Status: DC

## 2014-04-07 MED ORDER — TRAMADOL HCL 50 MG PO TABS: 50.0000 mg | ORAL_TABLET | Freq: Four times a day (QID) | ORAL | Status: DC | PRN

## 2014-04-07 NOTE — Progress Notes (Signed)
Pre visit review using our clinic review tool, if applicable. No additional management support is needed unless otherwise documented below in the visit note. 

## 2014-04-07 NOTE — Patient Instructions (Addendum)
Please take all new medication as prescribed - the tramadol for pain  OK to increase the thyroid medication to 100 mcg per day  Please return in 4 wks for lab work to follow up the thyroid as well as B12 level  Please continue all other medications as before, and refills have been done if requested.  Please have the pharmacy call with any other refills you may need.  Please continue your efforts at being more active, low cholesterol diet, and weight control.  You are otherwise up to date with prevention measures today.  Please keep your appointments with your specialists as you may have planned  Your lab work, and ECG were ok today  Please remember to sign up for MyChart if you have not done so, as this will be important to you in the future with finding out test results, communicating by private email, and scheduling acute appointments online when needed.  Please return in 1 year for your yearly visit, or sooner if needed, with Lab testing done 3-5 days before
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
## 2014-04-07 NOTE — Assessment & Plan Note (Signed)
Hypothyroid - for incr levothryox to 100 qd, f/u lab 4 wks

## 2014-04-07 NOTE — Assessment & Plan Note (Signed)
stable overall by history and exam, recent data reviewed with pt, and pt to continue medical treatment as before,  to f/u any worsening symptoms or concerns For f/u a1c

## 2014-04-07 NOTE — Assessment & Plan Note (Signed)

## 2014-04-07 NOTE — Assessment & Plan Note (Signed)
stable overall by history and exam, recent data reviewed with pt, and pt to continue medical treatment as before,  to f/u any worsening symptoms or concerns  

## 2014-04-22 ENCOUNTER — Ambulatory Visit: Payer: 59 | Admitting: Podiatrist

## 2014-05-19 ENCOUNTER — Other Ambulatory Visit (INDEPENDENT_AMBULATORY_CARE_PROVIDER_SITE_OTHER): Payer: 59

## 2014-05-19 DIAGNOSIS — E039 Hypothyroidism, unspecified: Secondary | ICD-10-CM

## 2014-05-19 DIAGNOSIS — M79604 Pain in right leg: Secondary | ICD-10-CM

## 2014-05-19 DIAGNOSIS — M79605 Pain in left leg: Secondary | ICD-10-CM

## 2014-05-19 DIAGNOSIS — R7302 Impaired glucose tolerance (oral): Secondary | ICD-10-CM

## 2014-05-19 DIAGNOSIS — Z0000 Encounter for general adult medical examination without abnormal findings: Secondary | ICD-10-CM

## 2014-05-19 LAB — HEMOGLOBIN A1C: Hgb A1c MFr Bld: 5.6 % (ref 4.6–6.5)

## 2014-05-19 LAB — CBC WITH DIFFERENTIAL/PLATELET
Basophils Absolute: 0 10*3/uL (ref 0.0–0.1)
Basophils Relative: 0.5 % (ref 0.0–3.0)
Eosinophils Absolute: 0.3 10*3/uL (ref 0.0–0.7)
Eosinophils Relative: 4.9 % (ref 0.0–5.0)
HCT: 42.6 % (ref 36.0–46.0)
Hemoglobin: 14.7 g/dL (ref 12.0–15.0)
Lymphocytes Relative: 27.2 % (ref 12.0–46.0)
Lymphs Abs: 1.9 10*3/uL (ref 0.7–4.0)
MCHC: 34.6 g/dL (ref 30.0–36.0)
MCV: 91.9 fl (ref 78.0–100.0)
Monocytes Absolute: 0.5 10*3/uL (ref 0.1–1.0)
Monocytes Relative: 7.2 % (ref 3.0–12.0)
Neutro Abs: 4.2 10*3/uL (ref 1.4–7.7)
Neutrophils Relative %: 60.2 % (ref 43.0–77.0)
Platelets: 264 10*3/uL (ref 150.0–400.0)
RBC: 4.64 Mil/uL (ref 3.87–5.11)
RDW: 13.1 % (ref 11.5–15.5)
WBC: 7 10*3/uL (ref 4.0–10.5)

## 2014-05-19 LAB — LIPID PANEL
Cholesterol: 147 mg/dL (ref 0–200)
HDL: 52.8 mg/dL (ref >39.00)
LDL Cholesterol: 72 mg/dL (ref 0–99)
NonHDL: 94.2
Total CHOL/HDL Ratio: 3
Triglycerides: 109 mg/dL (ref 0.0–149.0)
VLDL: 21.8 mg/dL (ref 0.0–40.0)

## 2014-05-19 LAB — T4, FREE: Free T4: 0.96 ng/dL (ref 0.60–1.60)

## 2014-05-19 LAB — BASIC METABOLIC PANEL
BUN: 14 mg/dL (ref 6–23)
CO2: 25 mEq/L (ref 19–32)
Calcium: 9.6 mg/dL (ref 8.4–10.5)
Chloride: 107 mEq/L (ref 96–112)
Creatinine, Ser: 0.82 mg/dL (ref 0.40–1.20)
GFR: 76.49 mL/min (ref >60.00)
Glucose, Bld: 79 mg/dL (ref 70–99)
Potassium: 4 mEq/L (ref 3.5–5.1)
Sodium: 141 mEq/L (ref 135–145)

## 2014-05-19 LAB — HEPATIC FUNCTION PANEL
ALT: 22 U/L (ref 0–35)
AST: 20 U/L (ref 0–37)
Albumin: 4.2 g/dL (ref 3.5–5.2)
Alkaline Phosphatase: 90 U/L (ref 39–117)
Bilirubin, Direct: 0.1 mg/dL (ref 0.0–0.3)
Total Bilirubin: 0.3 mg/dL (ref 0.2–1.2)
Total Protein: 7.1 g/dL (ref 6.0–8.3)

## 2014-05-19 LAB — VITAMIN B12: Vitamin B-12: 563 pg/mL (ref 211–911)

## 2014-05-19 LAB — TSH: TSH: 1.98 u[IU]/mL (ref 0.35–4.50)

## 2014-05-20 LAB — URINALYSIS, ROUTINE W REFLEX MICROSCOPIC
Bilirubin Urine: NEGATIVE
Hgb urine dipstick: NEGATIVE
Ketones, ur: NEGATIVE
Leukocytes, UA: NEGATIVE
Nitrite: NEGATIVE
RBC / HPF: NONE SEEN (ref 0–?)
Specific Gravity, Urine: 1.01 (ref 1.000–1.030)
Total Protein, Urine: NEGATIVE
Urine Glucose: NEGATIVE
Urobilinogen, UA: 0.2 (ref 0.0–1.0)
WBC, UA: NONE SEEN (ref 0–?)
pH: 7 (ref 5.0–8.0)

## 2014-05-22 ENCOUNTER — Encounter: Payer: Self-pay | Admitting: Internal Medicine

## 2014-05-30 ENCOUNTER — Emergency Department (HOSPITAL_COMMUNITY)
Admission: EM | Admit: 2014-05-30 | Discharge: 2014-05-30 | Disposition: A | Discharge status: Home / Self Care | Payer: 59 | Source: Home / Self Care | Attending: Family Medicine | Admitting: Family Medicine

## 2014-05-30 ENCOUNTER — Encounter (HOSPITAL_COMMUNITY): Payer: Self-pay | Admitting: *Deleted

## 2014-05-30 DIAGNOSIS — M6248 Contracture of muscle, other site: Secondary | ICD-10-CM | POA: Diagnosis not present

## 2014-05-30 DIAGNOSIS — M62838 Other muscle spasm: Secondary | ICD-10-CM

## 2014-05-30 MED ORDER — LORAZEPAM 2 MG/ML IJ SOLN
2.0000 mg | Freq: Once | INTRAMUSCULAR | Status: AC
  Administered 2014-05-30: 2 mg via INTRAMUSCULAR

## 2014-05-30 MED ORDER — METHOCARBAMOL 500 MG PO TABS: 500.0000 mg | ORAL_TABLET | Freq: Four times a day (QID) | ORAL | Status: DC | PRN

## 2014-05-30 MED ORDER — IBUPROFEN 800 MG PO TABS
ORAL_TABLET | ORAL | Status: AC
Start: 2014-05-30 — End: 2014-05-30
  Filled 2014-05-30: qty 1

## 2014-05-30 MED ORDER — IBUPROFEN 800 MG PO TABS
800.0000 mg | ORAL_TABLET | Freq: Once | ORAL | Status: AC
  Administered 2014-05-30: 800 mg via ORAL

## 2014-05-30 MED ORDER — LORAZEPAM 2 MG/ML IJ SOLN
INTRAMUSCULAR | Status: AC
Start: 2014-05-30 — End: 2014-05-30
  Filled 2014-05-30: qty 1
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
## 2014-05-30 NOTE — Discharge Instructions (Signed)
You are experiencing severe neck spasms. Your given Ativan in our clinic which should help significantly with relieving or aborting those spasms. The pain caused by the spasms may linger for several days. Please continue to use ibuprofen as needed for pain and Robaxin (a muscle relaxer)  to relieve future spasms. Please seek immediate medical attention in the emergency room if you do not improve within the next 24-48 hours.
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
## 2014-06-05 ENCOUNTER — Encounter: Payer: Self-pay | Admitting: Cardiovascular Disease

## 2014-06-08 ENCOUNTER — Encounter: Payer: Self-pay | Admitting: Nurse Practitioner

## 2014-06-08 ENCOUNTER — Ambulatory Visit (INDEPENDENT_AMBULATORY_CARE_PROVIDER_SITE_OTHER): Payer: 59 | Admitting: Nurse Practitioner

## 2014-06-08 VITALS — BP 118/70 | HR 78 | Temp 97.8°F | Ht 64.5 in | Wt 203.0 lb

## 2014-06-08 DIAGNOSIS — J0100 Acute maxillary sinusitis, unspecified: Secondary | ICD-10-CM

## 2014-06-08 MED ORDER — DOXYCYCLINE HYCLATE 100 MG PO TABS: 100.0000 mg | ORAL_TABLET | Freq: Two times a day (BID) | ORAL | Status: DC

## 2014-06-08 MED ORDER — NITROGLYCERIN 0.4 MG SL SUBL: 0.4000 mg | SUBLINGUAL_TABLET | SUBLINGUAL | Status: DC | PRN

## 2014-06-08 NOTE — Patient Instructions (Signed)
Start antibiotic. Eat yogurt daily at lunch or afternoon to help prevent diarrhea that can be caused by antibiotic. Start daily sinus rinses (Neilmed Sinus rinse) for at least 5-7 days. Stop over the counter cold medicines. Please call for re-evaluation if you are not improving.   Sinusitis Sinusitis is redness, soreness, and swelling (inflammation) of the paranasal sinuses. Paranasal sinuses are air pockets within the bones of your face (beneath the eyes, the middle of the forehead, or above the eyes). In healthy paranasal sinuses, mucus is able to drain out, and air is able to circulate through them by way of your nose. However, when your paranasal sinuses are inflamed, mucus and air can become trapped. This can allow bacteria and other germs to grow and cause infection. Sinusitis can develop quickly and last only a short time (acute) or continue over a long period (chronic). Sinusitis that lasts for more than 12 weeks is considered chronic.  CAUSES  Causes of sinusitis include:  Allergies.  Structural abnormalities, such as displacement of the cartilage that separates your nostrils (deviated septum), which can decrease the air flow through your nose and sinuses and affect sinus drainage.  Functional abnormalities, such as when the small hairs (cilia) that line your sinuses and help remove mucus do not work properly or are not present. SYMPTOMS  Symptoms of acute and chronic sinusitis are the same. The primary symptoms are pain and pressure around the affected sinuses. Other symptoms include:  Upper toothache.  Earache.  Headache.  Bad breath.  Decreased sense of smell and taste.  A cough, which worsens when you are lying flat.  Fatigue.  Fever.  Thick drainage from your nose, which often is green and may contain pus (purulent).  Swelling and warmth over the affected sinuses. DIAGNOSIS  Your caregiver will perform a physical exam. During the exam, your caregiver may:  Look in  your nose for signs of abnormal growths in your nostrils (nasal polyps).  Tap over the affected sinus to check for signs of infection.  View the inside of your sinuses (endoscopy) with a special imaging device with a light attached (endoscope), which is inserted into your sinuses. If your caregiver suspects that you have chronic sinusitis, one or more of the following tests may be recommended:  Allergy tests.  Nasal culture A sample of mucus is taken from your nose and sent to a lab and screened for bacteria.  Nasal cytology A sample of mucus is taken from your nose and examined by your caregiver to determine if your sinusitis is related to an allergy. TREATMENT  Most cases of acute sinusitis are related to a viral infection and will resolve on their own within 10 days. Sometimes medicines are prescribed to help relieve symptoms (pain medicine, decongestants, nasal steroid sprays, or saline sprays).  However, for sinusitis related to a bacterial infection, your caregiver will prescribe antibiotic medicines. These are medicines that will help kill the bacteria causing the infection.  Rarely, sinusitis is caused by a fungal infection. In theses cases, your caregiver will prescribe antifungal medicine. For some cases of chronic sinusitis, surgery is needed. Generally, these are cases in which sinusitis recurs more than 3 times per year, despite other treatments. HOME CARE INSTRUCTIONS   Drink plenty of water. Water helps thin the mucus so your sinuses can drain more easily.  Use a humidifier.  Inhale steam 3 to 4 times a day (for example, sit in the bathroom with the shower running).  Apply a warm, moist  washcloth to your face 3 to 4 times a day, or as directed by your caregiver.  Use saline nasal sprays to help moisten and clean your sinuses.  Take over-the-counter or prescription medicines for pain, discomfort, or fever only as directed by your caregiver. SEEK IMMEDIATE MEDICAL CARE  IF:  You have increasing pain or severe headaches.  You have nausea, vomiting, or drowsiness.  You have swelling around your face.  You have vision problems.  You have a stiff neck.  You have difficulty breathing. MAKE SURE YOU:   Understand these instructions.  Will watch your condition.  Will get help right away if you are not doing well or get worse. Document Released: 04/10/2005 Document Revised: 07/03/2011 Document Reviewed: 04/25/2011 [facility] Patient Information 2014 [place], [place].

## 2014-06-08 NOTE — Progress Notes (Signed)
   Subjective:    Patient ID: [name], female    DOB: 1955-06-09, 59 y.o.   MRN: [id number]  URI  This is a new problem. The current episode started 1 to 4 weeks ago (2 weeks). The problem has been gradually worsening. The maximum temperature recorded prior to her arrival was 100.4 - 100.9 F. Associated symptoms include chest pain (tight), congestion, coughing, headaches, a plugged ear sensation, sinus pain, a sore throat and swollen glands. Pertinent negatives include no abdominal pain, diarrhea, nausea, neck pain, sneezing or wheezing. She has tried nothing for the symptoms.      Review of Systems  Constitutional: Positive for fever and fatigue.  HENT: Positive for congestion, postnasal drip, sinus pressure and sore throat. Negative for sneezing.   Respiratory: Positive for cough and chest tightness. Negative for shortness of breath and wheezing.   Cardiovascular: Positive for chest pain (tight).  Gastrointestinal: Negative for nausea, abdominal pain and diarrhea.  Musculoskeletal: Negative for neck pain.  Neurological: Positive for headaches.       Objective:   Physical Exam  Constitutional: She is oriented to person, place, and time. She appears well-developed and well-nourished.  Flat affect, looks tired  HENT:  Head: Normocephalic and atraumatic.  Right Ear: External ear normal.  Left Ear: External ear normal.  Mouth/Throat: Oropharynx is clear and moist. No oropharyngeal exudate.  Eyes: Conjunctivae are normal. Right eye exhibits no discharge. Left eye exhibits no discharge.  Neck: Normal range of motion. Neck supple. No thyromegaly present.  Cardiovascular: Normal rate, regular rhythm and normal heart sounds.   No murmur heard. Pulmonary/Chest: Effort normal and breath sounds normal.  Lymphadenopathy:    She has no cervical adenopathy (tender at tonsillar nodes, but not palpable).  Neurological: She is alert and oriented to person, place, and time.  Skin: Skin is  warm and dry.  Psychiatric: She has a normal mood and affect. Her behavior is normal. Thought content normal.  Vitals reviewed.         Assessment & Plan:  1. Acute maxillary sinusitis, recurrence not specified - doxycycline (VIBRA-TABS) 100 MG tablet; Take 1 tablet (100 mg total) by mouth 2 (two) times daily.  Dispense: 14 tablet; Refill: 0 Daily sinus rinse F/u PRN

## 2014-06-08 NOTE — Progress Notes (Signed)
Pre visit review using our clinic review tool, if applicable. No additional management support is needed unless otherwise documented below in the visit note. 

## 2014-06-10 ENCOUNTER — Telehealth: Payer: Self-pay | Admitting: *Deleted

## 2014-06-10 NOTE — Telephone Encounter (Signed)
[facility] - Client [facility] Patient Name: [name] Gender: Female DOB: July 24, 1955 Age: 59 Y 44 M 13 D Return Phone Number: [phone] (Primary) Address: City/State/Zip: [place]  [address] Client [facility] - Client Client Site [facility] - Day Physician Cathlean Cower Contact Type Call Call Type Triage / Clinical Relationship To Patient Self Appointment Disposition EMR Patient Reports Appointment Already Scheduled Return Phone Number [phone] (Primary) Chief Complaint Cast or Splint Problems Initial Comment Caller states she thinks she has a sinus infection Info pasted into Epic No Nurse Assessment Nurse: Markus Daft, RN, Sherre Poot Date/Time (Eastern Time): 06/08/2014 4:31:01 PM Confirm and document reason for call. If symptomatic, describe symptoms. ---Caller states she has a sinus infection, just seen at PCP, and started on antibiotic. Denies further needs. Has the patient traveled out of the country within the last 30 days? ---Not Applicable Does the patient require triage? ---Declined Triage Please document clinical information provided and list any resource used. ---RN noted and advised caller if she needs further assistance or worsens in any way to call us back. Guidelines Guideline Title Affirmed Question Affirmed Notes Nurse Date/Time (Eastern Time) Disp. Time Eilene Ghazi Time) Disposition Final User 06/08/2014 4:33:33 PM Clinical Call Yes Markus Daft, RN, Sherre Poot After Care Instructions Given Call Event Type User Date / Time Description

## 2014-06-25 ENCOUNTER — Encounter: Payer: Self-pay | Admitting: Podiatrist

## 2014-06-25 ENCOUNTER — Ambulatory Visit (INDEPENDENT_AMBULATORY_CARE_PROVIDER_SITE_OTHER): Payer: 59 | Admitting: Podiatrist

## 2014-06-25 VITALS — BP 111/64 | HR 84 | Resp 12

## 2014-06-25 DIAGNOSIS — M722 Plantar fascial fibromatosis: Secondary | ICD-10-CM

## 2014-06-25 NOTE — Progress Notes (Signed)
   Subjective:    Patient ID: [name], female    DOB: 08/26/1955, 59 y.o.   MRN: [id number]  Chief Complaint  Patient presents with  . Foot Pain    "I need an injection my foot is killing me." left heel          Objective:   Physical Exam  Neurovascular status is unchanged. Plantar fasciitis symptomatologies elicited plantar medial aspect the left heel with pain on palpation when pressed.    Assessment & Plan:  Plantar fasciitis left foot  Plan: Injected the left heel under sterile technique for injection #2. Recommended power step inserts. She will call if there is minimal benefit from the injection.

## 2014-06-25 NOTE — Patient Instructions (Signed)

## 2014-07-01 ENCOUNTER — Encounter: Payer: Self-pay | Admitting: Podiatrist

## 2014-07-01 ENCOUNTER — Ambulatory Visit (INDEPENDENT_AMBULATORY_CARE_PROVIDER_SITE_OTHER): Payer: 59 | Admitting: Podiatrist

## 2014-07-01 VITALS — BP 106/64 | HR 80 | Resp 12

## 2014-07-01 DIAGNOSIS — S86012A Strain of left Achilles tendon, initial encounter: Secondary | ICD-10-CM

## 2014-07-01 NOTE — Patient Instructions (Signed)
Partial (Incomplete) Achilles Tendon Rupture An Achilles tendon rupture is an injury in which the tough, cord-like band that attaches the lower muscles of your leg to your heel (Achilles tendon) tears (ruptures). In a partial Achilles tendon rupture, surgery may not be needed. CAUSES  A tendon may rupture if it is weakened or weakening (degenerative) and a sudden stress is applied to it. Weakening or degeneration of a tendon may be caused by:  Recurrent injuries, such as those causing Achilles tendonitis.  Damaged tendons.  Aging.  Vascular disease of the tendon. SIGNS AND SYMPTOMS  Feeling as if you were struck violently in the back of the ankle.  Hearing a "pop" and experiencing severe, sudden (acute) pain; however, the absence of pain does not mean there was not a rupture. DIAGNOSIS A physical exam is usually all that is needed to diagnose an Achilles tendon rupture. During the exam, your health care provider will touch the tendon and the structures around it. You may be asked to lie on your stomach or kneel on a chair while your health care provider squeezes your calf muscle. You most likely have a ruptured tendon if your foot does not flex.  Sometimes tests are performed. These may include:  An MRI. TREATMENT  Treatment consists of:  Ice applied to the area.  Pain relieving medicines.  Rest.  Crutches.  Keeping the ankle from moving (immobilization), usually with asplint, for 6-10 weeks. If the injury does not improve within 3-6 months, you may need to go to a specialized runners' clinic or see a sports medicine specialist, physical therapist, or orthopedic surgeon. HOME CARE INSTRUCTIONS   Apply ice to the injured area:   Put ice in a plastic bag.  Place a towel between your skin and the bag.  Leave the ice on for 20 minutes, 2-3 times a day.  Use crutches and move about only as instructed by your health care provider.  Keep the leg elevated above the level of  the heart (the center of the chest) at all times when not using the bathroom. Do not dangle the leg over a chair, couch, or bed. When lying down, elevate your leg on a few pillows. Elevation prevents swelling and reduces pain.  Avoid use other than gentle range of motion of the toes while the tendon is painful.  Do not drive a car until your health care provider specifically tells you it is safe to do so.  Take all medicines as directed by your health care provider.  Keep all follow-up visits with your health care provider. SEEK MEDICAL CARE IF:   Your pain and swelling increase or pain is uncontrolled with medicines.   You develop new, unexplained symptoms or your symptoms get worse.   You cannot move your toes or foot.  You develop warmth and swelling in your foot.  You have an unexplained fever.  MAKE SURE YOU:   Understand these instructions.  Will watch your condition.  Will get help right away if you are not doing well or get worse. Document Released: 01/18/2005 Document Revised: 08/25/2013 Document Reviewed: 11/29/2012 [facility] Patient Information 2015 [place], [place]. This information is not intended to replace advice given to you by your health care provider. Make sure you discuss any questions you have with your health care provider.

## 2014-07-08 ENCOUNTER — Telehealth: Payer: Self-pay

## 2014-07-08 NOTE — Telephone Encounter (Signed)
Pt left message regarding if she could try going without the boot, I returned her call and left a message stating if her foot pain was gone she could try going without the boot, but if it returned then she could wear the boot to help off load the area and reduce inflammation, she could use the boot during flare ups and times of pain, to call with questions or concerns

## 2014-07-09 NOTE — Progress Notes (Signed)
   Subjective:    Patient ID: [name], female    DOB: Jul 06, 1955, 59 y.o.   MRN: [id number]  Chief Complaint  Patient presents with  . Plantar Fasciitis    ''LT FOOT BOTTOM OF THE HEEL IS PAINFUL ALL THE WAY UP BACK OF THE CALF.''   Patient presents today stating that she felt pain and felt a pop-like sensation in the plantar aspect of the heel she relates it hurts all the way back to up to the calf. She denies any trauma or injury. Denies any audible pop she can walk normally but with pain present.     Objective:   Physical Exam  Neurovascular status is unchanged. Plantar fasciitis symptomatologies elicited plantar medial aspect the left heel with pain on palpation when pressed. Mild swelling is present. Achilles tendon is intact. Mild pain at the muscular tendinous junction with palpation is noted. No pain with medial to lateral calf pressure.    Assessment & Plan:  Strain of Achilles tendon  Plan: Recommended use of a air fracture walker. She will use this consistently and all see her back for recheck.

## 2014-07-31 ENCOUNTER — Ambulatory Visit (INDEPENDENT_AMBULATORY_CARE_PROVIDER_SITE_OTHER): Payer: 59 | Admitting: Podiatrist

## 2014-07-31 ENCOUNTER — Encounter: Payer: Self-pay | Admitting: Podiatrist

## 2014-07-31 VITALS — BP 100/56 | HR 74 | Resp 12

## 2014-07-31 DIAGNOSIS — M779 Enthesopathy, unspecified: Secondary | ICD-10-CM | POA: Diagnosis not present

## 2014-07-31 DIAGNOSIS — M722 Plantar fascial fibromatosis: Secondary | ICD-10-CM | POA: Diagnosis not present

## 2014-08-18 NOTE — Progress Notes (Signed)
   Subjective:    Patient ID: [name], female    DOB: February 20, 1956, 59 y.o.   MRN: [id number]  Chief Complaint  Patient presents with  . Foot Pain    ''LT BOTTOM OF THE HEEL IS DOING MUCH BETTER, BUT HAVE TIGHTNESS FEELING.''   Patient presents today stating that her foot is improved.  The pain is better but she does still feel a tightness in the plantar foot.  Overall she is much improved.      Objective:   Physical Exam  Neurovascular status is unchanged. Improved plantar fasciitis symptomatologies elicited plantar medial aspect the left heel with pain on palpation when pressed. Improved swelling is present. Achilles tendon is intact. Improvement in pain at the muscular tendinous junction with palpation is noted. No pain with medial to lateral calf pressure.    Assessment & Plan:  Strain of Achilles tendon-- improved  Plan: recommended weaning off the air fracture walker and use of a compressive ankle brace.  She will go back in the boot if the pain returns-- it should resolve on its own with time.

## 2014-08-27 ENCOUNTER — Other Ambulatory Visit: Payer: Self-pay | Admitting: Internal Medicine

## 2014-09-11 ENCOUNTER — Encounter: Payer: Self-pay | Admitting: Internal Medicine

## 2014-10-13 ENCOUNTER — Other Ambulatory Visit: Payer: Self-pay | Admitting: Internal Medicine

## 2014-11-11 ENCOUNTER — Other Ambulatory Visit: Payer: Self-pay | Admitting: Obstetrics and Gynecology

## 2014-11-11 DIAGNOSIS — N6310 Unspecified lump in the right breast, unspecified quadrant: Secondary | ICD-10-CM

## 2014-11-12 ENCOUNTER — Other Ambulatory Visit: Payer: Self-pay | Admitting: Obstetrics and Gynecology

## 2014-11-12 ENCOUNTER — Ambulatory Visit
Admission: RE | Admit: 2014-11-12 | Discharge: 2014-11-12 | Disposition: A | Discharge status: Home / Self Care | Payer: 59 | Source: Ambulatory Visit | Attending: Obstetrics and Gynecology | Admitting: Obstetrics and Gynecology

## 2014-11-12 DIAGNOSIS — N6310 Unspecified lump in the right breast, unspecified quadrant: Secondary | ICD-10-CM

## 2014-11-12 DIAGNOSIS — N6320 Unspecified lump in the left breast, unspecified quadrant: Secondary | ICD-10-CM

## 2014-11-13 ENCOUNTER — Other Ambulatory Visit: Payer: Self-pay | Admitting: Obstetrics and Gynecology

## 2014-11-13 DIAGNOSIS — N6320 Unspecified lump in the left breast, unspecified quadrant: Secondary | ICD-10-CM

## 2014-11-16 ENCOUNTER — Ambulatory Visit
Admission: RE | Admit: 2014-11-16 | Discharge: 2014-11-16 | Disposition: A | Discharge status: Home / Self Care | Payer: 59 | Source: Ambulatory Visit | Attending: Obstetrics and Gynecology | Admitting: Obstetrics and Gynecology

## 2014-11-16 DIAGNOSIS — N6320 Unspecified lump in the left breast, unspecified quadrant: Secondary | ICD-10-CM

## 2014-11-16 HISTORY — PX: BREAST BIOPSY: SHX20

## 2014-12-20 ENCOUNTER — Encounter: Payer: Self-pay | Admitting: Internal Medicine

## 2014-12-21 MED ORDER — MECLIZINE HCL 12.5 MG PO TABS: 12.5000 mg | ORAL_TABLET | Freq: Three times a day (TID) | ORAL | Status: DC | PRN

## 2015-01-24 ENCOUNTER — Ambulatory Visit (INDEPENDENT_AMBULATORY_CARE_PROVIDER_SITE_OTHER): Payer: 59 | Admitting: Emergency Medicine

## 2015-01-24 VITALS — BP 100/70 | HR 77 | Temp 98.2°F | Resp 18 | Ht 64.0 in | Wt 185.5 lb

## 2015-01-24 DIAGNOSIS — K121 Other forms of stomatitis: Secondary | ICD-10-CM

## 2015-01-24 DIAGNOSIS — J301 Allergic rhinitis due to pollen: Secondary | ICD-10-CM | POA: Diagnosis not present

## 2015-01-24 DIAGNOSIS — J029 Acute pharyngitis, unspecified: Secondary | ICD-10-CM

## 2015-01-24 LAB — POCT RAPID STREP A (OFFICE): Rapid Strep A Screen: NEGATIVE

## 2015-01-24 MED ORDER — TRIAMCINOLONE ACETONIDE 0.1 % MT PSTE: 1.0000 "application " | PASTE | Freq: Two times a day (BID) | OROMUCOSAL | Status: DC

## 2015-01-24 MED ORDER — FLUTICASONE PROPIONATE 50 MCG/ACT NA SUSP: 2.0000 | Freq: Every day | NASAL | Status: DC

## 2015-01-24 NOTE — Patient Instructions (Signed)
Canker Sores  °Canker sores are painful, open sores on the inside of the mouth and cheek. They may be white or yellow. The sores usually heal in 1 to 2 weeks. Women are more likely than men to have recurrent canker sores. °CAUSES °The cause of canker sores is not well understood. More than one cause is likely. Canker sores do not appear to be caused by certain types of germs (viruses or bacteria). Canker sores may be caused by: °· An allergic reaction to certain foods. °· Digestive problems. °· Not having enough vitamin B12, folic acid, and iron. °· Female sex hormones. Sores may come only during certain phases of a menstrual cycle. Often, there is improvement during pregnancy. °· Genetics. Some people seem to inherit canker sore problems. °Emotional stress and injuries to the mouth may trigger outbreaks, but not cause them.  °DIAGNOSIS °Canker sores are diagnosed by exam.  °TREATMENT °· Patients who have frequent bouts of canker sores may have cultures taken of the sores, blood tests, or allergy tests. This helps determine if their sores are caused by a poor diet, an allergy, or some other preventable or treatable disease. °· Vitamins may prevent recurrences or reduce the severity of canker sores in people with poor nutrition. °· Numbing ointments can relieve pain. These are available in drug stores without a prescription. °· Anti-inflammatory steroid mouth rinses or gels may be prescribed by your caregiver for severe sores. °· Oral steroids may be prescribed if you have severe, recurrent canker sores. These strong medicines can cause many side effects and should be used only under the close direction of a dentist or physician. °· Mouth rinses containing the antibiotic medicine may be prescribed. They may lessen symptoms and speed healing. °Healing usually happens in about 1 or 2 weeks with or without treatment. Certain antibiotic mouth rinses given to pregnant women and young children can permanently stain teeth.  Talk to your caregiver about your treatment. °HOME CARE INSTRUCTIONS  °· Avoid foods that cause canker sores for you. °· Avoid citrus juices, spicy or salty foods, and coffee until the sores are healed. °· Use a soft-bristled toothbrush. °· Chew your food carefully to avoid biting your cheek. °· Apply topical numbing medicine to the sore to help relieve pain. °· Apply a thin paste of baking soda and water to the sore to help heal the sore. °· Only use mouth rinses or medicines for pain or discomfort as directed by your caregiver. °SEEK MEDICAL CARE IF:  °· Your symptoms are not better in 1 week. °· Your sores are still present after 2 weeks. °· Your sores are very painful. °· You have trouble breathing or swallowing. °· Your sores come back frequently. °Document Released: 08/05/2010 Document Revised: 08/05/2012 Document Reviewed: 08/05/2010 °[employer]® Patient Information ©2015 [employer]. This information is not intended to replace advice given to you by your health care provider. Make sure you discuss any questions you have with your health care provider. ° °
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
## 2015-03-03 ENCOUNTER — Other Ambulatory Visit (HOSPITAL_COMMUNITY): Payer: Self-pay | Admitting: Neurosurgery

## 2015-03-03 DIAGNOSIS — M549 Dorsalgia, unspecified: Secondary | ICD-10-CM

## 2015-03-05 ENCOUNTER — Emergency Department (HOSPITAL_COMMUNITY)
Admission: EM | Admit: 2015-03-05 | Discharge: 2015-03-05 | Disposition: A | Discharge status: Home / Self Care | Payer: 59 | Attending: Emergency Medicine | Admitting: Emergency Medicine

## 2015-03-05 ENCOUNTER — Encounter (HOSPITAL_COMMUNITY): Payer: Self-pay

## 2015-03-05 ENCOUNTER — Other Ambulatory Visit: Payer: Self-pay | Admitting: Internal Medicine

## 2015-03-05 ENCOUNTER — Emergency Department (HOSPITAL_COMMUNITY): Payer: 59

## 2015-03-05 DIAGNOSIS — E039 Hypothyroidism, unspecified: Secondary | ICD-10-CM | POA: Diagnosis not present

## 2015-03-05 DIAGNOSIS — Z7951 Long term (current) use of inhaled steroids: Secondary | ICD-10-CM | POA: Insufficient documentation

## 2015-03-05 DIAGNOSIS — J45909 Unspecified asthma, uncomplicated: Secondary | ICD-10-CM | POA: Diagnosis not present

## 2015-03-05 DIAGNOSIS — M545 Low back pain, unspecified: Secondary | ICD-10-CM

## 2015-03-05 DIAGNOSIS — Z87448 Personal history of other diseases of urinary system: Secondary | ICD-10-CM | POA: Insufficient documentation

## 2015-03-05 DIAGNOSIS — Z86010 Personal history of colonic polyps: Secondary | ICD-10-CM | POA: Diagnosis not present

## 2015-03-05 DIAGNOSIS — Z8659 Personal history of other mental and behavioral disorders: Secondary | ICD-10-CM | POA: Insufficient documentation

## 2015-03-05 DIAGNOSIS — Z79899 Other long term (current) drug therapy: Secondary | ICD-10-CM | POA: Insufficient documentation

## 2015-03-05 DIAGNOSIS — Z8719 Personal history of other diseases of the digestive system: Secondary | ICD-10-CM | POA: Insufficient documentation

## 2015-03-05 DIAGNOSIS — Z91040 Latex allergy status: Secondary | ICD-10-CM | POA: Insufficient documentation

## 2015-03-05 DIAGNOSIS — Z87891 Personal history of nicotine dependence: Secondary | ICD-10-CM | POA: Diagnosis not present

## 2015-03-05 DIAGNOSIS — Z87828 Personal history of other (healed) physical injury and trauma: Secondary | ICD-10-CM | POA: Diagnosis not present

## 2015-03-05 DIAGNOSIS — Z880 Allergy status to penicillin: Secondary | ICD-10-CM | POA: Insufficient documentation

## 2015-03-05 DIAGNOSIS — E669 Obesity, unspecified: Secondary | ICD-10-CM | POA: Insufficient documentation

## 2015-03-05 DIAGNOSIS — Z87442 Personal history of urinary calculi: Secondary | ICD-10-CM | POA: Insufficient documentation

## 2015-03-05 LAB — BASIC METABOLIC PANEL
Anion gap: 8 (ref 5–15)
BUN: 17 mg/dL (ref 6–20)
CO2: 30 mmol/L (ref 22–32)
Calcium: 9.1 mg/dL (ref 8.9–10.3)
Chloride: 100 mmol/L — ABNORMAL LOW (ref 101–111)
Creatinine, Ser: 0.92 mg/dL (ref 0.44–1.00)
GFR calc Af Amer: >60 mL/min (ref >60)
GFR calc non Af Amer: >60 mL/min (ref >60)
Glucose, Bld: 95 mg/dL (ref 65–99)
Potassium: 3.6 mmol/L (ref 3.5–5.1)
Sodium: 138 mmol/L (ref 135–145)

## 2015-03-05 LAB — CBC WITH DIFFERENTIAL/PLATELET
Basophils Absolute: 0 10*3/uL (ref 0.0–0.1)
Basophils Relative: 1 %
Eosinophils Absolute: 0.4 10*3/uL (ref 0.0–0.7)
Eosinophils Relative: 6 %
HCT: 42.9 % (ref 36.0–46.0)
Hemoglobin: 14.9 g/dL (ref 12.0–15.0)
Lymphocytes Relative: 32 %
Lymphs Abs: 2.1 10*3/uL (ref 0.7–4.0)
MCH: 32.1 pg (ref 26.0–34.0)
MCHC: 34.7 g/dL (ref 30.0–36.0)
MCV: 92.5 fL (ref 78.0–100.0)
Monocytes Absolute: 0.4 10*3/uL (ref 0.1–1.0)
Monocytes Relative: 7 %
Neutro Abs: 3.7 10*3/uL (ref 1.7–7.7)
Neutrophils Relative %: 56 %
Platelets: 252 10*3/uL (ref 150–400)
RBC: 4.64 MIL/uL (ref 3.87–5.11)
RDW: 12.6 % (ref 11.5–15.5)
WBC: 6.6 10*3/uL (ref 4.0–10.5)

## 2015-03-05 LAB — URINALYSIS, ROUTINE W REFLEX MICROSCOPIC
Bilirubin Urine: NEGATIVE
Glucose, UA: NEGATIVE mg/dL
Hgb urine dipstick: NEGATIVE
Ketones, ur: NEGATIVE mg/dL
Leukocytes, UA: NEGATIVE
Nitrite: NEGATIVE
Protein, ur: NEGATIVE mg/dL
Specific Gravity, Urine: 1.009 (ref 1.005–1.030)
Urobilinogen, UA: 0.2 mg/dL (ref 0.0–1.0)
pH: 6 (ref 5.0–8.0)

## 2015-03-05 MED ORDER — LIDOCAINE 5 % EX PTCH: 1.0000 | MEDICATED_PATCH | CUTANEOUS | Status: DC

## 2015-03-05 MED ORDER — METHOCARBAMOL 500 MG PO TABS: 500.0000 mg | ORAL_TABLET | Freq: Three times a day (TID) | ORAL | Status: DC | PRN

## 2015-03-05 MED ORDER — GADOBENATE DIMEGLUMINE 529 MG/ML IV SOLN
20.0000 mL | Freq: Once | INTRAVENOUS | Status: AC | PRN
  Administered 2015-03-05: 16 mL via INTRAVENOUS

## 2015-03-05 MED ORDER — OXYCODONE HCL 5 MG PO TABS
10.0000 mg | ORAL_TABLET | Freq: Once | ORAL | Status: AC
Start: 2015-03-05 — End: 2015-03-05
  Administered 2015-03-05: 10 mg via ORAL
  Filled 2015-03-05: qty 2

## 2015-03-05 MED ORDER — HYDROMORPHONE HCL 1 MG/ML IJ SOLN
1.0000 mg | Freq: Once | INTRAMUSCULAR | Status: AC
  Administered 2015-03-05: 1 mg via INTRAVENOUS
  Filled 2015-03-05: qty 1

## 2015-03-05 NOTE — ED Notes (Signed)
Nurse will get blood. 

## 2015-03-05 NOTE — Discharge Instructions (Signed)

## 2015-03-05 NOTE — ED Notes (Signed)
Pt c/o increasing lower back pain radiating down BLE and R groin since 10/26.  Pain score 7/10.  Pt reports taking oxycodone w/o relief.  Pt sts "I just sat down in my car and the pain started."  Pt has a MRI scheduled for tomorrow.  Pt is being followed by Joya Salm MD for complaint.

## 2015-03-05 NOTE — ED Notes (Signed)
Unable to obtain blood and start PIV, second RN to attempt ultrasound IV insertion
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
## 2015-03-06 ENCOUNTER — Ambulatory Visit (HOSPITAL_BASED_OUTPATIENT_CLINIC_OR_DEPARTMENT_OTHER): Payer: 59

## 2015-03-10 ENCOUNTER — Encounter: Payer: Self-pay | Admitting: Internal Medicine

## 2015-03-10 DIAGNOSIS — R739 Hyperglycemia, unspecified: Secondary | ICD-10-CM

## 2015-03-10 DIAGNOSIS — Z0000 Encounter for general adult medical examination without abnormal findings: Secondary | ICD-10-CM

## 2015-03-16 ENCOUNTER — Other Ambulatory Visit: Payer: Self-pay

## 2015-03-16 DIAGNOSIS — Z1231 Encounter for screening mammogram for malignant neoplasm of breast: Secondary | ICD-10-CM

## 2015-04-05 ENCOUNTER — Other Ambulatory Visit (INDEPENDENT_AMBULATORY_CARE_PROVIDER_SITE_OTHER): Payer: 59

## 2015-04-05 DIAGNOSIS — Z0000 Encounter for general adult medical examination without abnormal findings: Secondary | ICD-10-CM

## 2015-04-05 DIAGNOSIS — Z0189 Encounter for other specified special examinations: Secondary | ICD-10-CM

## 2015-04-05 DIAGNOSIS — R739 Hyperglycemia, unspecified: Secondary | ICD-10-CM | POA: Diagnosis not present

## 2015-04-05 LAB — URINALYSIS, ROUTINE W REFLEX MICROSCOPIC
Bilirubin Urine: NEGATIVE
Hgb urine dipstick: NEGATIVE
Ketones, ur: NEGATIVE
Leukocytes, UA: NEGATIVE
Nitrite: NEGATIVE
RBC / HPF: NONE SEEN
Specific Gravity, Urine: <=1.005 — AB
Total Protein, Urine: NEGATIVE
Urine Glucose: NEGATIVE
Urobilinogen, UA: 0.2
pH: 6 (ref 5.0–8.0)

## 2015-04-05 LAB — HEPATIC FUNCTION PANEL
ALT: 22 U/L (ref 0–35)
AST: 17 U/L (ref 0–37)
Albumin: 4.3 g/dL (ref 3.5–5.2)
Alkaline Phosphatase: 93 U/L (ref 39–117)
Bilirubin, Direct: 0.1 mg/dL (ref 0.0–0.3)
Total Bilirubin: 0.5 mg/dL (ref 0.2–1.2)
Total Protein: 7.1 g/dL (ref 6.0–8.3)

## 2015-04-05 LAB — BASIC METABOLIC PANEL
BUN: 10 mg/dL (ref 6–23)
CO2: 28 mEq/L (ref 19–32)
Calcium: 9.6 mg/dL (ref 8.4–10.5)
Chloride: 102 mEq/L (ref 96–112)
Creatinine, Ser: 0.84 mg/dL (ref 0.40–1.20)
GFR: 74.16 mL/min (ref >60.00)
Glucose, Bld: 89 mg/dL (ref 70–99)
Potassium: 3.8 mEq/L (ref 3.5–5.1)
Sodium: 139 mEq/L (ref 135–145)

## 2015-04-05 LAB — CBC WITH DIFFERENTIAL/PLATELET
Basophils Absolute: 0 10*3/uL (ref 0.0–0.1)
Basophils Relative: 0.6 % (ref 0.0–3.0)
Eosinophils Absolute: 0.3 10*3/uL (ref 0.0–0.7)
Eosinophils Relative: 4.3 % (ref 0.0–5.0)
HCT: 41 % (ref 36.0–46.0)
Hemoglobin: 14 g/dL (ref 12.0–15.0)
Lymphocytes Relative: 27.7 % (ref 12.0–46.0)
Lymphs Abs: 1.9 10*3/uL (ref 0.7–4.0)
MCHC: 34 g/dL (ref 30.0–36.0)
MCV: 92.7 fl (ref 78.0–100.0)
Monocytes Absolute: 0.5 10*3/uL (ref 0.1–1.0)
Monocytes Relative: 7.5 % (ref 3.0–12.0)
Neutro Abs: 4.1 10*3/uL (ref 1.4–7.7)
Neutrophils Relative %: 59.9 % (ref 43.0–77.0)
Platelets: 252 10*3/uL (ref 150.0–400.0)
RBC: 4.43 Mil/uL (ref 3.87–5.11)
RDW: 13.4 % (ref 11.5–15.5)
WBC: 6.9 10*3/uL (ref 4.0–10.5)

## 2015-04-05 LAB — HEMOGLOBIN A1C: Hgb A1c MFr Bld: 5.5 % (ref 4.6–6.5)

## 2015-04-05 LAB — LIPID PANEL
Cholesterol: 142 mg/dL (ref 0–200)
HDL: 55.3 mg/dL
LDL Cholesterol: 72 mg/dL (ref 0–99)
NonHDL: 86.78
Total CHOL/HDL Ratio: 3
Triglycerides: 72 mg/dL (ref 0.0–149.0)
VLDL: 14.4 mg/dL (ref 0.0–40.0)

## 2015-04-05 LAB — TSH: TSH: 6.91 u[IU]/mL — ABNORMAL HIGH (ref 0.35–4.50)

## 2015-04-06 LAB — HEPATITIS C ANTIBODY: HCV Ab: NEGATIVE

## 2015-04-08 ENCOUNTER — Encounter: Payer: Self-pay | Admitting: Internal Medicine

## 2015-04-13 ENCOUNTER — Encounter: Payer: 59 | Admitting: Internal Medicine

## 2015-04-15 ENCOUNTER — Encounter: Payer: Self-pay | Admitting: Internal Medicine

## 2015-04-15 ENCOUNTER — Ambulatory Visit (INDEPENDENT_AMBULATORY_CARE_PROVIDER_SITE_OTHER): Payer: 59 | Admitting: Internal Medicine

## 2015-04-15 VITALS — BP 120/72 | HR 87 | Temp 98.6°F | Ht 64.0 in | Wt 191.0 lb

## 2015-04-15 DIAGNOSIS — F411 Generalized anxiety disorder: Secondary | ICD-10-CM

## 2015-04-15 DIAGNOSIS — Z0000 Encounter for general adult medical examination without abnormal findings: Secondary | ICD-10-CM | POA: Diagnosis not present

## 2015-04-15 DIAGNOSIS — E039 Hypothyroidism, unspecified: Secondary | ICD-10-CM | POA: Diagnosis not present

## 2015-04-15 MED ORDER — LEVOTHYROXINE SODIUM 112 MCG PO TABS: 112.0000 ug | ORAL_TABLET | Freq: Every day | ORAL | Status: DC

## 2015-04-15 MED ORDER — PANTOPRAZOLE SODIUM 40 MG PO TBEC
40.0000 mg | DELAYED_RELEASE_TABLET | Freq: Every day | ORAL | Status: DC
Start: 2015-04-15 — End: 2016-04-18

## 2015-04-15 MED ORDER — ALPRAZOLAM 0.25 MG PO TABS: 0.2500 mg | ORAL_TABLET | Freq: Two times a day (BID) | ORAL | Status: DC | PRN

## 2015-04-15 MED ORDER — ALBUTEROL SULFATE HFA 108 (90 BASE) MCG/ACT IN AERS: 2.0000 | INHALATION_SPRAY | Freq: Four times a day (QID) | RESPIRATORY_TRACT | Status: DC | PRN

## 2015-04-15 MED ORDER — FUROSEMIDE 40 MG PO TABS: 40.0000 mg | ORAL_TABLET | Freq: Every day | ORAL | Status: DC

## 2015-04-15 MED ORDER — LEVOTHYROXINE SODIUM 100 MCG PO TABS: 100.0000 ug | ORAL_TABLET | Freq: Every day | ORAL | Status: DC

## 2015-04-15 MED ORDER — POTASSIUM CHLORIDE CRYS ER 20 MEQ PO TBCR: EXTENDED_RELEASE_TABLET | ORAL | Status: DC

## 2015-04-15 NOTE — Progress Notes (Signed)
Pre visit review using our clinic review tool, if applicable. No additional management support is needed unless otherwise documented below in the visit note. 

## 2015-04-15 NOTE — Assessment & Plan Note (Signed)
Situational, ok for limited xanax prn,  to f/u any worsening symptoms or concerns
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
## 2015-04-15 NOTE — Patient Instructions (Addendum)
Ok to increase the levothyroxine to 112 mcg per day  Please return in 1 month to the Lab for f/u thyroid testing  Please take all new medication as prescribed  - the xanax as needed  Please continue all other medications as before, and refills have been done if requested such as the proventil inhaler  Please have the pharmacy call with any other refills you may need.  Please continue your efforts at being more active, low cholesterol diet, and weight control.  You are otherwise up to date with prevention measures today.  Please keep your appointments with your specialists as you may have planned  Please return in 1 year for your yearly visit, or sooner if needed, with Lab testing done 3-5 days before

## 2015-04-15 NOTE — Assessment & Plan Note (Signed)
For increased levothyroxin to 112, then f/u lab in 4 wks

## 2015-04-15 NOTE — Assessment & Plan Note (Signed)

## 2015-04-20 ENCOUNTER — Ambulatory Visit: Payer: 59

## 2015-04-22 ENCOUNTER — Telehealth: Payer: Self-pay | Admitting: Internal Medicine

## 2015-04-22 ENCOUNTER — Ambulatory Visit (INDEPENDENT_AMBULATORY_CARE_PROVIDER_SITE_OTHER): Payer: 59 | Admitting: Physician Assistant

## 2015-04-22 ENCOUNTER — Ambulatory Visit (INDEPENDENT_AMBULATORY_CARE_PROVIDER_SITE_OTHER): Payer: 59

## 2015-04-22 VITALS — BP 98/62 | HR 100 | Temp 97.2°F | Resp 20 | Ht 64.5 in | Wt 190.2 lb

## 2015-04-22 DIAGNOSIS — R062 Wheezing: Secondary | ICD-10-CM | POA: Diagnosis not present

## 2015-04-22 DIAGNOSIS — J209 Acute bronchitis, unspecified: Secondary | ICD-10-CM

## 2015-04-22 DIAGNOSIS — R059 Cough, unspecified: Secondary | ICD-10-CM

## 2015-04-22 DIAGNOSIS — M940 Chondrocostal junction syndrome [Tietze]: Secondary | ICD-10-CM

## 2015-04-22 DIAGNOSIS — R05 Cough: Secondary | ICD-10-CM | POA: Diagnosis not present

## 2015-04-22 LAB — POCT CBC
Granulocyte percent: 75 %G (ref 37–80)
HCT, POC: 42.2 % (ref 37.7–47.9)
Hemoglobin: 14.6 g/dL (ref 12.2–16.2)
Lymph, poc: 1.5 (ref 0.6–3.4)
MCH, POC: 31.4 pg — AB (ref 27–31.2)
MCHC: 34.5 g/dL (ref 31.8–35.4)
MCV: 91.1 fL (ref 80–97)
MID (cbc): 0.7 (ref 0–0.9)
MPV: 6.5 fL (ref 0–99.8)
POC Granulocyte: 6.8 (ref 2–6.9)
POC LYMPH PERCENT: 16.9 %L (ref 10–50)
POC MID %: 8.1 %M (ref 0–12)
Platelet Count, POC: 283 10*3/uL (ref 142–424)
RBC: 4.63 M/uL (ref 4.04–5.48)
RDW, POC: 12.9 %
WBC: 9.1 10*3/uL (ref 4.6–10.2)

## 2015-04-22 MED ORDER — BENZONATATE 100 MG PO CAPS: 100.0000 mg | ORAL_CAPSULE | Freq: Three times a day (TID) | ORAL | Status: DC | PRN

## 2015-04-22 MED ORDER — AZITHROMYCIN 250 MG PO TABS: ORAL_TABLET | ORAL | Status: AC

## 2015-04-22 MED ORDER — ALBUTEROL SULFATE (2.5 MG/3ML) 0.083% IN NEBU
2.5000 mg | INHALATION_SOLUTION | Freq: Once | RESPIRATORY_TRACT | Status: AC
  Administered 2015-04-22: 2.5 mg via RESPIRATORY_TRACT

## 2015-04-22 MED ORDER — HYDROCOD POLST-CPM POLST ER 10-8 MG/5ML PO SUER: 5.0000 mL | Freq: Two times a day (BID) | ORAL | Status: DC | PRN

## 2015-04-22 MED ORDER — IPRATROPIUM BROMIDE 0.02 % IN SOLN
0.5000 mg | Freq: Once | RESPIRATORY_TRACT | Status: AC
  Administered 2015-04-22: 0.5 mg via RESPIRATORY_TRACT

## 2015-04-22 NOTE — Patient Instructions (Signed)
Drink plenty of water (64 oz/day) and get plenty of rest. If you have been prescribed a cough syrup, do not drive or operate heavy machinery while using this medication. Tessalon for cough during the day. zpak as directed. Schedule your albuterol inhaler every 4 hours for the next 2 days, then use as needed for chest tightness and wheezing If your symptoms are not improving in 1 week, return to clinic.

## 2015-04-22 NOTE — Progress Notes (Signed)
[facility] [address] [place] [address] [phone]  Date:  04/22/2015   Name:  [name]   DOB:  [birth year]   MRN:  [id number]  PCP:  Cathlean Cower, MD    Chief Complaint: Nasal Congestion; Cough; Chest Pain; and Shortness of Breath   History of Present Illness:  This is a 59 y.o. female with PMH allergic rhinitis, hypothyroidism, GERD, DDD, anxiety/depression who is presenting with nasal congestion, cough, malaise x 4 days. States she was at the hospital yesterday visiting her daughter. This morning when she woke she felt much worse. Her cough is very productive. She is experiencing malaise. She drove here this morning and brought a cup to spit her sputum in, filled 2 inches of a coffee cup on her 10 minute drive. She is having sob and wheezing with exertion. She does have a history of mild intermittent asthma - used her inhaler this morning and helped some. She is experiencing anterior and right inferior rib pain with coughing. She denies otalgia, sore throat, fever, chills. Other than her albuterol she has tried tylenol and allegra-D with minimal relief. She has a hx of env allergies - uses flonase and loratidine. She is a former smoker, quit 20 years ago.    Aggravating/alleviating factors: took tylenol and allegra-D History of asthma: yes, mild intermittent History of env allergies: yes, uses flonase and loratidine  Tobacco use: no, former smoker, quit 20 years ago.   Review of Systems:  Review of Systems See HPI  Patient Active Problem List   Diagnosis Date Noted  . Acute colitis 08/30/2013  . GERD (gastroesophageal reflux disease) 08/14/2012  . Bilateral leg and foot pain 10/23/2011  . Depression 07/10/2011  . History of renal stone 04/15/2011  . Pleural effusion 04/14/2011  . Impaired glucose tolerance 04/14/2011  . Hypokalemia 04/14/2011  . Chronic LBP 03/29/2011  . Obesity 03/29/2011  . THYROID NODULE, RIGHT 02/21/2010  . NECK  PAIN, RIGHT 02/21/2010  . Edema 02/21/2010  . 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 FATIGUE 03/11/2009  . SINUSITIS, CHRONIC 11/27/2008  . Hypothyroidism 07/19/2007  . Anxiety state 07/19/2007  . ALLERGIC RHINITIS 07/19/2007  . DIVERTICULOSIS, COLON 07/19/2007  . DEGENERATIVE DISC DISEASE 07/19/2007  . COLONIC POLYPS, HX OF 07/19/2007    Prior to Admission medications   Medication Sig Start Date End Date Taking? Authorizing Provider  acetaminophen (TYLENOL) 500 MG tablet Take 1,000 mg by mouth every 6 (six) hours as needed for moderate pain.    Yes Historical Provider, MD  albuterol (PROVENTIL HFA;VENTOLIN HFA) 108 (90 BASE) MCG/ACT inhaler Inhale 2 puffs into the lungs every 6 (six) hours as needed for wheezing or shortness of breath. 04/15/15  Yes Biagio Borg, MD  ALPRAZolam Duanne Moron) 0.25 MG tablet Take 1 tablet (0.25 mg total) by mouth 2 (two) times daily as needed for anxiety. 04/15/15  Yes Biagio Borg, MD  fluticasone (FLONASE) 50 MCG/ACT nasal spray Place 2 sprays into both nostrils daily. 01/24/15  Yes Darlyne Russian, MD  furosemide (LASIX) 40 MG tablet Take 1 tablet (40 mg total) by mouth daily. 04/15/15  Yes Biagio Borg, MD  levothyroxine (SYNTHROID, LEVOTHROID) 112 MCG tablet Take 1 tablet (112 mcg total) by mouth daily. 04/15/15  Yes Biagio Borg, MD  LORATADINE PO Take by mouth.   Yes Historical Provider, MD  nitroGLYCERIN (NITROSTAT) 0.4 MG SL tablet Place 1 tablet (0.4 mg total) under the tongue every 5 (five) minutes  as needed for chest pain. 06/08/14  Yes Biagio Borg, MD  Oxycodone HCl 10 MG TABS Take 1 tablet by mouth every 4 (four) hours as needed. Reported on 04/22/2015 03/02/15  Yes Historical Provider, MD  pantoprazole (PROTONIX) 40 MG tablet Take 1 tablet (40 mg total) by mouth daily. 04/15/15  Yes Biagio Borg, MD  potassium chloride SA (K-DUR,KLOR-CON) 20 MEQ tablet TAKE 1 TABLET BY MOUTH ONCE DAILY 04/15/15  Yes Biagio Borg, MD                                               Allergies  Allergen Reactions  . Amoxicillin Shortness Of Breath and Rash    REACTION: rash, sob - tol kelfex and rocephn hosp 06/2009  . Aspirin Shortness Of Breath and Rash  . Latex Shortness Of Breath and Dermatitis    Blisters on skin  . Sulfonamide Derivatives Shortness Of Breath and Rash    REACTION: ?rash, sob - but reports bactrim tolerence  . Avelox [Moxifloxacin Hcl In Nacl] Hives  . Moxifloxacin Hives    REACTION: hives - but tol cipro w/o adv rxn    Past Surgical History  Procedure Laterality Date  . Abdominal hysterectomy    . Cholecystectomy    . Ovarian cyst surgery    . Breast surgery  11/2007    Biopsy  . Cystoscopy  2008  . Wisdom tooth extraction    . Nasal sinus surgery      x3 - to remove a tooth  . Lumbar laminectomy/decompression microdiscectomy Left 12/18/2012    Procedure: Left Lumbar Five Sacral One Extraforaminal Microdiskectomy;  Surgeon: Floyce Stakes, MD;  Location: [facility];  Service: Neurosurgery;  Laterality: Left;  LUMBAR LAMINECTOMY/DECOMPRESSION MICRODISCECTOMY 1 LEVEL  . Bladder surgery      Social History  Substance Use Topics  . Smoking status: Former Smoker -- 4 years    Quit date: 09/13/1979  . Smokeless tobacco: Never Used  . Alcohol Use: Yes     Comment: occ    Family History  Problem Relation Age of Onset  . Dementia Mother   . Colon cancer Mother 5  . Breast cancer Mother   . Dementia Father   . Colon cancer Father 30  . Heart disease Neg Hx   . Stomach cancer Neg Hx   . Hypertension Other   . Cancer Other     Medication list has been reviewed and updated.  Physical Examination:  Physical Exam  Constitutional: She is oriented to person, place, and time. She appears well-developed and well-nourished. No distress.  HENT:  Head: Normocephalic and atraumatic.  Right Ear: Hearing, tympanic membrane, external ear and ear canal normal.  Left Ear: Hearing, tympanic membrane, external ear and ear canal  normal.  Nose: Nose normal. Right sinus exhibits no maxillary sinus tenderness and no frontal sinus tenderness. Left sinus exhibits no maxillary sinus tenderness and no frontal sinus tenderness.  Mouth/Throat: Uvula is midline and mucous membranes are normal. Posterior oropharyngeal erythema present. No oropharyngeal exudate or posterior oropharyngeal edema.  Eyes: Conjunctivae and lids are normal. Right eye exhibits no discharge. Left eye exhibits no discharge. No scleral icterus.  Cardiovascular: Normal rate, regular rhythm, normal heart sounds and normal pulses.   No murmur heard. Pulmonary/Chest: Effort normal. No respiratory distress. She has wheezes (few). She has rhonchi (  throughout). She has no rales. She exhibits tenderness.    Wheezing resolved after duoneb treatment. Some symptomatic relief with treatment. No rales  Musculoskeletal: Normal range of motion.  Lymphadenopathy:       Head (right side): No submental, no submandibular and no tonsillar adenopathy present.       Head (left side): No submental, no submandibular and no tonsillar adenopathy present.    She has no cervical adenopathy.  Neurological: She is alert and oriented to person, place, and time.  Skin: Skin is warm, dry and intact. No lesion and no rash noted.  Psychiatric: She has a normal mood and affect. Her speech is normal and behavior is normal. Thought content normal.   BP 98/62 mmHg  Pulse 100  Temp(Src) 97.2 F (36.2 C) (Oral)  Resp 20  Ht 5' 4.5" (1.638 m)  Wt 190 lb 3.2 oz (86.274 kg)  BMI 32.16 kg/m2  SpO2 96%  Results for orders placed or performed in visit on 04/22/15  POCT CBC  Result Value Ref Range   WBC 9.1 4.6 - 10.2 K/uL   Lymph, poc 1.5 0.6 - 3.4   POC LYMPH PERCENT 16.9 10 - 50 %L   MID (cbc) 0.7 0 - 0.9   POC MID % 8.1 0 - 12 %M   POC Granulocyte 6.8 2 - 6.9   Granulocyte percent 75.0 37 - 80 %G   RBC 4.63 4.04 - 5.48 M/uL   Hemoglobin 14.6 12.2 - 16.2 g/dL   HCT, POC 42.2 37.7  - 47.9 %   MCV 91.1 80 - 97 fL   MCH, POC 31.4 (A) 27 - 31.2 pg   MCHC 34.5 31.8 - 35.4 g/dL   RDW, POC 12.9 %   Platelet Count, POC 283 142 - 424 K/uL   MPV 6.5 0 - 99.8 fL   IMPRESSION: No active cardiopulmonary disease.  Assessment and Plan:  1. Acute bronchitis, unspecified organism 2. Cough 3. Wheezing 4. Costochondritis  CBC wnl and CXR negative. D/t large amount sputum production and sob, will treat with zpak. Advised she schedule her albuterol every 4 hours for next 48 hours, then prn for wheezing/sob thereafter. Tessalon and cough syrup for symptomatic relief. Chest pain reproduced with palpation, likely costochondritis from coughing. Advised scheduled tylenol or ibuprofen. Return in 1 week if symptoms not improving or at any time if symptoms worsening. - azithromycin (ZITHROMAX) 250 MG tablet; Take 2 tabs PO x 1 dose, then 1 tab PO QD x 4 days  Dispense: 6 tablet; Refill: 0 - benzonatate (TESSALON) 100 MG capsule; Take 1-2 capsules (100-200 mg total) by mouth 3 (three) times daily as needed for cough.  Dispense: 40 capsule; Refill: 0 - chlorpheniramine-HYDROcodone (TUSSIONEX PENNKINETIC ER) 10-8 MG/5ML SUER; Take 5 mLs by mouth every 12 (twelve) hours as needed for cough.  Dispense: 100 mL; Refill: 0 - POCT CBC - DG Chest 2 View; Future - albuterol (PROVENTIL) (2.5 MG/3ML) 0.083% nebulizer solution 2.5 mg; Take 3 mLs (2.5 mg total) by nebulization once. - ipratropium (ATROVENT) nebulizer solution 0.5 mg; Take 2.5 mLs (0.5 mg total) by nebulization once.   Benjaman Pott Drenda Freeze, MHS [facility]  04/22/2015

## 2015-04-22 NOTE — Telephone Encounter (Signed)
Phone number listed is not a working number.

## 2015-04-22 NOTE — Telephone Encounter (Signed)
Pt called in for an appointment. I can't get her in today but did schedule her for tomorrow. She is feeling awful and sounds terrible. She really wants to talk with you  Can you please call her at [phone]

## 2015-04-22 NOTE — Telephone Encounter (Signed)
[facility] phone lines where down when I attempted to call patient back. Pt since seen at [facility]

## 2015-04-23 ENCOUNTER — Ambulatory Visit
Admission: RE | Admit: 2015-04-23 | Discharge: 2015-04-23 | Disposition: A | Discharge status: Home / Self Care | Payer: 59 | Source: Ambulatory Visit

## 2015-04-23 ENCOUNTER — Ambulatory Visit: Payer: 59 | Admitting: Internal Medicine

## 2015-04-23 DIAGNOSIS — Z1231 Encounter for screening mammogram for malignant neoplasm of breast: Secondary | ICD-10-CM

## 2015-05-04 DIAGNOSIS — Z01419 Encounter for gynecological examination (general) (routine) without abnormal findings: Secondary | ICD-10-CM | POA: Diagnosis not present

## 2015-05-04 DIAGNOSIS — Z6833 Body mass index (BMI) 33.0-33.9, adult: Secondary | ICD-10-CM | POA: Diagnosis not present

## 2015-05-04 MED FILL — AZITHROMYCIN 250 MG TABLET: 250 | 5 days supply | Qty: 6 | Fill #0

## 2015-05-05 ENCOUNTER — Other Ambulatory Visit: Payer: Self-pay | Admitting: Obstetrics and Gynecology

## 2015-05-05 ENCOUNTER — Ambulatory Visit
Admission: RE | Admit: 2015-05-05 | Discharge: 2015-05-05 | Disposition: A | Discharge status: Home / Self Care | Payer: 59 | Source: Ambulatory Visit | Attending: Obstetrics and Gynecology | Admitting: Obstetrics and Gynecology

## 2015-05-05 DIAGNOSIS — N6001 Solitary cyst of right breast: Secondary | ICD-10-CM | POA: Diagnosis not present

## 2015-05-05 DIAGNOSIS — N6310 Unspecified lump in the right breast, unspecified quadrant: Secondary | ICD-10-CM

## 2015-05-05 DIAGNOSIS — N6011 Diffuse cystic mastopathy of right breast: Secondary | ICD-10-CM | POA: Diagnosis not present

## 2015-05-05 DIAGNOSIS — N63 Unspecified lump in breast: Secondary | ICD-10-CM | POA: Diagnosis not present

## 2015-05-26 ENCOUNTER — Ambulatory Visit (INDEPENDENT_AMBULATORY_CARE_PROVIDER_SITE_OTHER): Payer: 59 | Admitting: Emergency Medicine

## 2015-05-26 VITALS — BP 108/70 | HR 80 | Temp 98.1°F | Resp 16 | Ht 64.75 in | Wt 188.0 lb

## 2015-05-26 DIAGNOSIS — R112 Nausea with vomiting, unspecified: Secondary | ICD-10-CM

## 2015-05-26 DIAGNOSIS — R1084 Generalized abdominal pain: Secondary | ICD-10-CM | POA: Diagnosis not present

## 2015-05-26 DIAGNOSIS — A09 Infectious gastroenteritis and colitis, unspecified: Secondary | ICD-10-CM | POA: Diagnosis not present

## 2015-05-26 DIAGNOSIS — R197 Diarrhea, unspecified: Secondary | ICD-10-CM

## 2015-05-26 LAB — POCT CBC
Granulocyte percent: 73.7 %G (ref 37–80)
HCT, POC: 40.3 % (ref 37.7–47.9)
Hemoglobin: 13.7 g/dL (ref 12.2–16.2)
Lymph, poc: 1.6 (ref 0.6–3.4)
MCH, POC: 30.8 pg (ref 27–31.2)
MCHC: 33.9 g/dL (ref 31.8–35.4)
MCV: 907 fL — AB (ref 80–97)
MID (cbc): 0.3 (ref 0–0.9)
MPV: 6.6 fL (ref 0–99.8)
POC Granulocyte: 5.3 (ref 2–6.9)
POC LYMPH PERCENT: 22.6 %L (ref 10–50)
POC MID %: 3.7 %M (ref 0–12)
Platelet Count, POC: 198 10*3/uL (ref 142–424)
RBC: 4.44 M/uL (ref 4.04–5.48)
RDW, POC: 13.3 %
WBC: 7.2 10*3/uL (ref 4.6–10.2)

## 2015-05-26 LAB — POCT URINALYSIS DIP (MANUAL ENTRY)
Bilirubin, UA: NEGATIVE
Glucose, UA: NEGATIVE
Ketones, POC UA: NEGATIVE
Leukocytes, UA: NEGATIVE
Nitrite, UA: NEGATIVE
Protein Ur, POC: NEGATIVE
Spec Grav, UA: 1.015
Urobilinogen, UA: 0.2
pH, UA: 5.5

## 2015-05-26 LAB — BASIC METABOLIC PANEL WITH GFR

## 2015-05-26 MED ORDER — ONDANSETRON 4 MG PO TBDP: 4.0000 mg | ORAL_TABLET | Freq: Three times a day (TID) | ORAL | Status: DC | PRN

## 2015-05-26 MED ORDER — ONDANSETRON 4 MG PO TBDP
4.0000 mg | ORAL_TABLET | Freq: Once | ORAL | Status: AC
  Administered 2015-05-26: 4 mg via ORAL

## 2015-05-26 MED FILL — ONDANSETRON ODT 4 MG TABLET: 4 | 6 days supply | Qty: 20 | Fill #0

## 2015-05-26 NOTE — Patient Instructions (Signed)
Norovirus Infection °A norovirus infection is caused by exposure to a virus in a group of similar viruses (noroviruses). This type of infection causes inflammation in your stomach and intestines (gastroenteritis). Norovirus is the most common cause of gastroenteritis. It also causes food poisoning. °Anyone can get a norovirus infection. It spreads very easily (contagious). You can get it from contaminated food, water, surfaces, or other people. Norovirus is found in the stool or vomit of infected people. You can spread the infection as soon as you feel sick until 2 weeks after you recover.  °Symptoms usually begin within 2 days after you become infected. Most norovirus symptoms affect the digestive system. °CAUSES °Norovirus infection is caused by contact with norovirus. You can catch norovirus if you: °· Eat or drink something contaminated with norovirus. °· Touch surfaces or objects contaminated with norovirus and then put your hand in your mouth. °· Have direct contact with an infected person who has symptoms. °· Share food, drink, or utensils with someone with who is sick with norovirus. °SIGNS AND SYMPTOMS °Symptoms of norovirus may include: °· Nausea. °· Vomiting. °· Diarrhea. °· Stomach cramps. °· Fever. °· Chills. °· Headache. °· Muscle aches. °· Tiredness. °DIAGNOSIS °Your health care provider may suspect norovirus based on your symptoms and physical exam. Your health care provider may also test a sample of your stool or vomit for the virus.  °TREATMENT °There is no specific treatment for norovirus. Most people get better without treatment in about 2 days. °HOME CARE INSTRUCTIONS °· Replace lost fluids by drinking plenty of water or rehydration fluids containing important minerals called electrolytes. This prevents dehydration. Drink enough fluid to keep your urine clear or pale yellow. °· Do not prepare food for others while you are infected. Wait at least 3 days after recovering from the illness to do  that. °PREVENTION  °· Wash your hands often, especially after using the toilet or changing a diaper. °· Wash fruits and vegetables thoroughly before preparing or serving them. °· Throw out any food that a sick person may have touched. °· Disinfect contaminated surfaces immediately after someone in the household has been sick. Use a bleach-based household cleaner. °· Immediately remove and wash soiled clothes or sheets. °SEEK MEDICAL CARE IF: °· Your vomiting, diarrhea, and stomach pain is getting worse. °· Your symptoms of norovirus do not go away after 2-3 days. °SEEK IMMEDIATE MEDICAL CARE IF:  °You develop symptoms of dehydration that do not improve with fluid replacement. This may include: °· Excessive sleepiness. °· Lack of tears. °· Dry mouth. °· Dizziness when standing. °· Weak pulse. °  °This information is not intended to replace advice given to you by your health care provider. Make sure you discuss any questions you have with your health care provider. °  °Document Released: 07/01/2002 Document Revised: 05/01/2014 Document Reviewed: 09/18/2013 °Elsevier Interactive Patient Education ©2016 [employer] ° °
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
## 2015-05-27 ENCOUNTER — Telehealth: Payer: Self-pay | Admitting: *Deleted

## 2015-05-27 NOTE — Telephone Encounter (Signed)
Just cancel the BMP for now. Call patient check on status. If she is doing well no need to repeat blood test.

## 2015-05-27 NOTE — Telephone Encounter (Signed)
Dr. Ricki Rodriguez called about pts BMP.  Their machine rejected the sample.  We discussed this yesterday that it maybe because I drew it off of the arm with the IV while the IV was running.  Would you like for me to call pt back in?  I had to redraw her CBC, because that was affected as well.

## 2015-05-28 NOTE — Telephone Encounter (Signed)
[phone]  Patient wants to know her lab results and if she needs to redo the blood tests.

## 2015-05-29 ENCOUNTER — Encounter: Payer: Self-pay | Admitting: Emergency Medicine

## 2015-05-31 LAB — GASTROINTESTINAL PATHOGEN PANEL PCR
C. difficile Tox A/B, PCR: NEGATIVE
Campylobacter, PCR: NEGATIVE
Cryptosporidium, PCR: NEGATIVE
E coli (ETEC) LT/ST PCR: NEGATIVE
E coli (STEC) stx1/stx2, PCR: NEGATIVE
E coli 0157, PCR: NEGATIVE
Giardia lamblia, PCR: NEGATIVE
Norovirus, PCR: POSITIVE — CR
Rotavirus A, PCR: NEGATIVE
Salmonella, PCR: NEGATIVE
Shigella, PCR: NEGATIVE

## 2015-06-01 NOTE — Telephone Encounter (Signed)
Pt notified via mychart

## 2015-06-03 ENCOUNTER — Encounter: Payer: Self-pay | Admitting: General Surgery

## 2015-06-03 DIAGNOSIS — N6001 Solitary cyst of right breast: Secondary | ICD-10-CM | POA: Diagnosis not present

## 2015-06-03 NOTE — Progress Notes (Signed)
[name]. [name] 06/03/2015 2:25 PM Location: [facility] Patient #: [id number] DOB: 21-May-1955 Married / Language: English / Race: White Female  History of Present Illness Odis Hollingshead MD; 06/03/2015 2:51 PM) The patient is a 60 year old female.   Note:She is referred by Dr. Gaetano Net because of an inferior right breast mass that turned out to be some simple cysts. She underwent her annual screening mammogram at the end of December 2016. 2 weeks after this, she noted a fullness in the 6:00 region. Targeted imaging demonstrated multiple cysts. The largest cyst which measured 1.7 x 1.3 cm was aspirated. Then brownish yellowish fluid was removed and the cyst completely collapsed. She has a family history of breast cancer in that her mother and her mother's 4 sisters all had breast cancer in her 22s. Age at menarche was greater than 39. First childbirth at age 26. She is just now undergoing menopause. She states her breasts have been very tender since the mammogram.  Other Problems Elbert Ewings, CMA; 06/03/2015 2:25 PM) Anxiety Disorder Back Pain Gastroesophageal Reflux Disease Thyroid Disease  Past Surgical History Elbert Ewings, CMA; 06/03/2015 2:25 PM) Colon Polyp Removal - Colonoscopy Hysterectomy (not due to cancer) - Partial Oral Surgery Sentinel Lymph Node Biopsy Spinal Surgery - Lower Back  Diagnostic Studies History Elbert Ewings, CMA; 06/03/2015 2:25 PM) Mammogram within last year Pap Smear 1-5 years ago  Allergies Elbert Ewings, CMA; 06/03/2015 2:26 PM) Amoxicillin *PENICILLINS* Shortness of breath. Aspirin *ANALGESICS - NonNarcotic* Shortness of breath. Sulfo-Lo *DERMATOLOGICALS* Avelox *FLUOROQUINOLONES* Moxifloxacin HCl *Fluoroquinolones**  Medication History Elbert Ewings, CMA; 06/03/2015 2:28 PM) Ventolin HFA (108 (90 Base)MCG/ACT Aerosol Soln, Inhalation) Active. ALPRAZolam (0.25MG  Tablet, Oral) Active. Fluticasone Propionate (50MCG/ACT  Suspension, Nasal) Active. Furosemide (40MG  Tablet, Oral) Active. Levothyroxine Sodium (112MCG Tablet, Oral) Active. Pantoprazole Sodium (40MG  Tablet DR, Oral) Active. Nitroglycerin (0.4MG  Tab Sublingual, Sublingual) Active. Potassium Chloride (20MEQ Tablet ER, Oral) Active. Benzonatate (100MG  Capsule, Oral) Active. Medications Reconciled  Social History Elbert Ewings, [place]; 06/03/2015 2:25 PM) Alcohol use Occasional alcohol use. Caffeine use Coffee, Tea. No drug use Tobacco use Former smoker.  Family History Elbert Ewings, [place]; 06/03/2015 2:25 PM) Arthritis Brother, Father, Mother, Sister. Bleeding disorder Mother. Breast Cancer Mother. Cerebrovascular Accident Father. Colon Polyps Father, Mother. Heart Disease Brother. Hypertension Father, Mother. Migraine Headache Father, Mother. Thyroid problems Father, Mother.  Pregnancy / Birth History Elbert Ewings, CMA; 06/03/2015 2:25 PM) Age at menarche 33 years. Gravida 3 Para 3     Review of Systems Elbert Ewings CMA; 06/03/2015 2:25 PM) General Not Present- Appetite Loss, Chills, Fatigue, Fever, Night Sweats, Weight Gain and Weight Loss. HEENT Present- Seasonal Allergies and Wears glasses/contact lenses. Not Present- Earache, Hearing Loss, Hoarseness, Nose Bleed, Oral Ulcers, Ringing in the Ears, Sinus Pain, Sore Throat, Visual Disturbances and Yellow Eyes. Breast Present- Breast Mass. Not Present- Breast Pain, Nipple Discharge and Skin Changes. Endocrine Present- Hot flashes. Not Present- Cold Intolerance, Excessive Hunger, Hair Changes, Heat Intolerance and New Diabetes. Hematology Not Present- Easy Bruising, Excessive bleeding, Gland problems, HIV and Persistent Infections.  Vitals Elbert Ewings CMA; 06/03/2015 2:28 PM) 06/03/2015 2:28 PM Weight: 188 lb Height: 64in Body Surface Area: 1.91 m Body Mass Index: 32.27 kg/m  Temp.: 97.15F  Pulse: 81 (Regular)  BP: 134/64 (Sitting, Left Arm,  Standard)      Physical Exam Odis Hollingshead MD; 06/03/2015 2:52 PM)  The physical exam findings are as follows: Note:General: WDWN female, slightly anxious  HEENT: [place]/AT, no facial masses  BREASTS: Symmetrical  in size. No dominant masses, nipple discharge or suspicious skin lesions.  LYMPHATIC: No palpable cervical, supraclavicular, axillary adenopathy.  NEUROLOGIC: Alert and oriented, answers questions appropriately.  PSYCHIATRIC: Normal mood, affect , and behavior.    Assessment & Plan Odis Hollingshead MD; 06/03/2015 2:53 PM)  BREAST CYST, RIGHT (N60.01) Impression: This was aspirated at the breast imaging center. She has no palpable, dominant masses in either breast. Of note, as mentioned above, she has a family history of breast cancer. Her mother had breast cancer as well as her mother's 4 sisters. They were all in their 58s.  Plan: I don't think any further workup is necessary at this time with respect to the breast cysts. I briefly spoke with her about potential genetic counseling and told her I would reach out to the genetic counselor to see if she would qualify for genetic testing. She is not sure she wants to pursue this so I told her to call back if she was interested in pursuing this.  Jackolyn Confer, MD

## 2015-06-25 ENCOUNTER — Ambulatory Visit (INDEPENDENT_AMBULATORY_CARE_PROVIDER_SITE_OTHER): Payer: 59 | Admitting: Physician Assistant

## 2015-06-25 VITALS — BP 112/72 | HR 94 | Temp 98.8°F | Resp 18 | Ht 64.0 in | Wt 191.0 lb

## 2015-06-25 DIAGNOSIS — J209 Acute bronchitis, unspecified: Secondary | ICD-10-CM | POA: Diagnosis not present

## 2015-06-25 DIAGNOSIS — J0100 Acute maxillary sinusitis, unspecified: Secondary | ICD-10-CM | POA: Diagnosis not present

## 2015-06-25 MED ORDER — AZITHROMYCIN 250 MG PO TABS
ORAL_TABLET | ORAL | Status: AC
Start: 2015-06-25 — End: 2015-06-30

## 2015-06-25 MED ORDER — HYDROCODONE-HOMATROPINE 5-1.5 MG/5ML PO SYRP: 5.0000 mL | ORAL_SOLUTION | Freq: Three times a day (TID) | ORAL | Status: DC | PRN

## 2015-06-25 MED FILL — HYDROCODONE-HOMATROPINE SYR: 5-1.5 | 8 days supply | Qty: 120 | Fill #0

## 2015-06-25 MED FILL — AZITHROMYCIN 250 MG TABLET: 250 | 5 days supply | Qty: 6 | Fill #0

## 2015-06-25 NOTE — Patient Instructions (Signed)
Cough syrup and zpak. Drink plenty of water (64 oz/day) and get plenty of rest. If you have been prescribed a cough syrup, do not drive or operate heavy machinery while using this medication. If you have been prescribed a nasal spray, use twice a day. Can continue tessalon If your symptoms are not improving in 1 week, return to clinic.

## 2015-06-25 NOTE — Progress Notes (Signed)
[facility] [address] [place] [address] [phone]  Date:  06/25/2015   Name:  [name]   DOB:  1956-04-10   MRN:  [id number]  PCP:  Cathlean Cower, MD    Chief Complaint: sinus pressure and Nasal Congestion   History of Present Illness:  This is a 60 y.o. female with PMH depression, gerd, hypothyroidism, anxiety, ddd who is presenting with sinus pressure and nasal congestion x 9 days. Been taking tessalon and claritin - helping some. Having a lot of facial pain. States she is having continuous rhinorrhea.  Sore throat from drainage. Mild teeth pain. Pressure in right ear. Mild cough. Some wheezing, has had to use inhaler. Denies dizziness, fever, chills. Has been using nasonex. States the tussionex flavor makes her vomit. Wants to know if she could get a different cough syrup. Pt states zpak works best for her sinus infections. Doxy also works but states zpak better for her.  History of asthma: yes History of env allergies: yes Tobacco use: no Did get flu shot this season She states she gets 2 sinus infections a year.    Review of Systems:  Review of Systems See HPI  Patient Active Problem List   Diagnosis Date Noted  . Acute colitis 08/30/2013  . GERD (gastroesophageal reflux disease) 08/14/2012  . Bilateral leg and foot pain 10/23/2011  . Depression 07/10/2011  . History of renal stone 04/15/2011  . Pleural effusion 04/14/2011  . Impaired glucose tolerance 04/14/2011  . Hypokalemia 04/14/2011  . Chronic LBP 03/29/2011  . Obesity 03/29/2011  . THYROID NODULE, RIGHT 02/21/2010  . NECK PAIN, RIGHT 02/21/2010  . Edema 02/21/2010  . 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 FATIGUE 03/11/2009  . SINUSITIS, CHRONIC 11/27/2008  . Hypothyroidism 07/19/2007  . Anxiety state 07/19/2007  . ALLERGIC RHINITIS 07/19/2007  . DIVERTICULOSIS, COLON 07/19/2007  . DEGENERATIVE DISC DISEASE 07/19/2007  . COLONIC POLYPS, HX OF 07/19/2007    Prior to  Admission medications   Medication Sig Start Date End Date Taking? Authorizing Provider  acetaminophen (TYLENOL) 500 MG tablet Take 1,000 mg by mouth every 6 (six) hours as needed for moderate pain.    Yes Historical Provider, MD  albuterol (PROVENTIL HFA;VENTOLIN HFA) 108 (90 BASE) MCG/ACT inhaler Inhale 2 puffs into the lungs every 6 (six) hours as needed for wheezing or shortness of breath. 04/15/15  Yes Biagio Borg, MD  ALPRAZolam Duanne Moron) 0.25 MG tablet Take 1 tablet (0.25 mg total) by mouth 2 (two) times daily as needed for anxiety. 04/15/15  Yes Biagio Borg, MD  benzonatate (TESSALON) 100 MG capsule Take 1-2 capsules (100-200 mg total) by mouth 3 (three) times daily as needed for cough. 04/22/15  Yes Bennett Scrape V, PA-C  fluticasone (FLONASE) 50 MCG/ACT nasal spray Place 2 sprays into both nostrils daily. 01/24/15  Yes Darlyne Russian, MD  furosemide (LASIX) 40 MG tablet Take 1 tablet (40 mg total) by mouth daily. 04/15/15  Yes Biagio Borg, MD  levothyroxine (SYNTHROID, LEVOTHROID) 112 MCG tablet Take 1 tablet (112 mcg total) by mouth daily. 04/15/15  Yes Biagio Borg, MD  LORATADINE PO Take by mouth.   Yes Historical Provider, MD  nitroGLYCERIN (NITROSTAT) 0.4 MG SL tablet Place 1 tablet (0.4 mg total) under the tongue every 5 (five) minutes as needed for chest pain. 06/08/14  Yes Biagio Borg, MD  ondansetron (ZOFRAN ODT) 4 MG disintegrating tablet Take 1 tablet (4 mg total) by  mouth every 8 (eight) hours as needed for nausea or vomiting. 05/26/15  Yes Darlyne Russian, MD  Oxycodone HCl 10 MG TABS Take 1 tablet by mouth every 4 (four) hours as needed. Reported on 05/26/2015 03/02/15  Yes Historical Provider, MD  pantoprazole (PROTONIX) 40 MG tablet Take 1 tablet (40 mg total) by mouth daily. 04/15/15  Yes Biagio Borg, MD  potassium chloride SA (K-DUR,KLOR-CON) 20 MEQ tablet TAKE 1 TABLET BY MOUTH ONCE DAILY 04/15/15  Yes Biagio Borg, MD  chlorpheniramine-HYDROcodone [facility] ER) 10-8  MG/5ML SUER Take 5 mLs by mouth every 12 (twelve) hours as needed for cough. Patient not taking: Reported on 05/26/2015 04/22/15   Bennett Scrape V, PA-C  lidocaine (LIDODERM) 5 % Place 1 patch onto the skin daily. Remove & Discard patch within 12 hours or as directed by MD Patient not taking: Reported on 04/15/2015 03/05/15   Quintella Reichert, MD    Allergies  Allergen Reactions  . Amoxicillin Shortness Of Breath and Rash    REACTION: rash, sob - tol kelfex and rocephn hosp 06/2009  . Aspirin Shortness Of Breath and Rash  . Latex Shortness Of Breath and Dermatitis    Blisters on skin  . Sulfonamide Derivatives Shortness Of Breath and Rash    REACTION: ?rash, sob - but reports bactrim tolerence  . Avelox [Moxifloxacin Hcl In Nacl] Hives  . Moxifloxacin Hives    REACTION: hives - but tol cipro w/o adv rxn    Past Surgical History  Procedure Laterality Date  . Abdominal hysterectomy    . Cholecystectomy    . Ovarian cyst surgery    . Breast surgery  11/2007    Biopsy  . Cystoscopy  2008  . Wisdom tooth extraction    . Nasal sinus surgery      x3 - to remove a tooth  . Lumbar laminectomy/decompression microdiscectomy Left 12/18/2012    Procedure: Left Lumbar Five Sacral One Extraforaminal Microdiskectomy;  Surgeon: Floyce Stakes, MD;  Location: [facility];  Service: Neurosurgery;  Laterality: Left;  LUMBAR LAMINECTOMY/DECOMPRESSION MICRODISCECTOMY 1 LEVEL  . Bladder surgery      Social History  Substance Use Topics  . Smoking status: Former Smoker -- 4 years    Quit date: 09/13/1979  . Smokeless tobacco: Never Used  . Alcohol Use: Yes     Comment: occ    Family History  Problem Relation Age of Onset  . Dementia Mother   . Colon cancer Mother 58  . Breast cancer Mother   . Dementia Father   . Colon cancer Father 43  . Heart disease Neg Hx   . Stomach cancer Neg Hx   . Hypertension Other   . Cancer Other     Medication list has been reviewed and updated.  Physical  Examination:  Physical Exam  Constitutional: She is oriented to person, place, and time. She appears well-developed and well-nourished. No distress.  HENT:  Head: Normocephalic and atraumatic.  Right Ear: Hearing, tympanic membrane, external ear and ear canal normal.  Left Ear: Hearing, tympanic membrane, external ear and ear canal normal.  Nose: Right sinus exhibits maxillary sinus tenderness. Right sinus exhibits no frontal sinus tenderness. Left sinus exhibits maxillary sinus tenderness. Left sinus exhibits no frontal sinus tenderness.  Mouth/Throat: Uvula is midline and mucous membranes are normal. Posterior oropharyngeal erythema present. No oropharyngeal exudate, posterior oropharyngeal edema or tonsillar abscesses.  Eyes: Conjunctivae and lids are normal. Right eye exhibits no discharge. Left eye  exhibits no discharge. No scleral icterus.  Cardiovascular: Normal rate, regular rhythm, normal heart sounds and normal pulses.   No murmur heard. Pulmonary/Chest: Effort normal and breath sounds normal. No respiratory distress. She has no wheezes. She has no rhonchi. She has no rales.  Musculoskeletal: Normal range of motion.  Lymphadenopathy:       Head (right side): No submental, no submandibular and no tonsillar adenopathy present.       Head (left side): No submental, no submandibular and no tonsillar adenopathy present.    She has no cervical adenopathy.  Neurological: She is alert and oriented to person, place, and time.  Skin: Skin is warm, dry and intact. No lesion and no rash noted.  Psychiatric: She has a normal mood and affect. Her speech is normal and behavior is normal. Thought content normal.   BP 112/72 mmHg  Pulse 94  Temp(Src) 98.8 F (37.1 C) (Oral)  Resp 18  Ht 5\' 4"  (1.626 m)  Wt 191 lb (86.637 kg)  BMI 32.77 kg/m2  SpO2 97%  Assessment and Plan:  1. Acute bronchitis, unspecified organism 2. Acute maxillary sinusitis Treat sinusitis with zpak, pt states zpak  works best for her sinus infections. Will also treat lung pathogens. She wants to try diff cough syrup, hycodan sent. Return in 7-10 days if symptoms do not improve or at any time if symptoms worsen.  - azithromycin (ZITHROMAX) 250 MG tablet; Take 2 tabs PO x 1 dose, then 1 tab PO QD x 4 days  Dispense: 6 tablet; Refill: 0 - HYDROcodone-homatropine (HYCODAN) 5-1.5 MG/5ML syrup; Take 5 mLs by mouth every 8 (eight) hours as needed for cough.  Dispense: 120 mL; Refill: 0   Ailyn Gladd V. Drenda Freeze, MHS [facility]  06/25/2015

## 2015-07-17 MED FILL — PANTOPRAZOLE SOD DR 40 MG T: 40 | 90 days supply | Qty: 90 | Fill #1

## 2015-07-17 MED FILL — LEVOTHYROXINE 112 MCG TAB: 112 | 90 days supply | Qty: 90 | Fill #1

## 2015-09-01 ENCOUNTER — Ambulatory Visit (HOSPITAL_COMMUNITY)
Admission: EM | Admit: 2015-09-01 | Discharge: 2015-09-01 | Disposition: A | Discharge status: Home / Self Care | Payer: 59 | Attending: Family Medicine | Admitting: Family Medicine

## 2015-09-01 ENCOUNTER — Encounter (HOSPITAL_COMMUNITY): Payer: Self-pay | Admitting: *Deleted

## 2015-09-01 DIAGNOSIS — H9203 Otalgia, bilateral: Secondary | ICD-10-CM

## 2015-09-01 DIAGNOSIS — H61893 Other specified disorders of external ear, bilateral: Secondary | ICD-10-CM | POA: Diagnosis not present

## 2015-09-01 MED ORDER — TRAMADOL HCL 50 MG PO TABS: 50.0000 mg | ORAL_TABLET | Freq: Four times a day (QID) | ORAL | Status: DC | PRN

## 2015-09-01 NOTE — Discharge Instructions (Signed)
Heat and pain medicine as needed, see ent doctor if further problems.
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
## 2015-09-01 NOTE — ED Notes (Signed)
Pt  Reports  Bilateral   Earache       X  yest     No  Recent      airtravel     No  dizzyness           No     Uri   Symptoms

## 2015-09-02 DIAGNOSIS — H9203 Otalgia, bilateral: Secondary | ICD-10-CM | POA: Diagnosis not present

## 2015-09-02 DIAGNOSIS — M26623 Arthralgia of bilateral temporomandibular joint: Secondary | ICD-10-CM | POA: Diagnosis not present

## 2015-09-02 MED FILL — METHYLPREDNISOLONE 4 MG TAB: 4 | 6 days supply | Qty: 21 | Fill #0

## 2015-09-02 MED FILL — DOXYCYCLINE HYCLATE 100 MG: 100 | 10 days supply | Qty: 20 | Fill #0

## 2015-10-22 MED FILL — LEVOTHYROXINE 112 MCG TAB: 112 | 90 days supply | Qty: 90 | Fill #2

## 2015-10-22 MED FILL — PANTOPRAZOLE SOD DR 40 MG T: 40 | 90 days supply | Qty: 90 | Fill #2

## 2015-11-27 ENCOUNTER — Ambulatory Visit (INDEPENDENT_AMBULATORY_CARE_PROVIDER_SITE_OTHER): Payer: 59 | Admitting: Family Medicine

## 2015-11-27 VITALS — BP 118/74 | HR 91 | Temp 98.2°F | Resp 16 | Ht 64.0 in | Wt 202.8 lb

## 2015-11-27 DIAGNOSIS — T63441A Toxic effect of venom of bees, accidental (unintentional), initial encounter: Secondary | ICD-10-CM | POA: Diagnosis not present

## 2015-11-27 DIAGNOSIS — S60512A Abrasion of left hand, initial encounter: Secondary | ICD-10-CM | POA: Diagnosis not present

## 2015-11-27 DIAGNOSIS — M25442 Effusion, left hand: Secondary | ICD-10-CM | POA: Diagnosis not present

## 2015-11-27 MED ORDER — DOXYCYCLINE HYCLATE 100 MG PO TABS: 100.0000 mg | ORAL_TABLET | Freq: Two times a day (BID) | ORAL | 0 refills | Status: DC

## 2015-11-27 MED ORDER — MUPIROCIN CALCIUM 2 % EX CREA: 1.0000 "application " | TOPICAL_CREAM | Freq: Three times a day (TID) | CUTANEOUS | 0 refills | Status: DC

## 2015-11-27 NOTE — Patient Instructions (Addendum)
For your finger swelling, this should improve in next 2 days now that the rings are not pressing on the finger. Apply Bactroban ointment after soap and water cleansing to the abrasion 2-3 times per day. If the swelling is not improving into Monday, or persistent redness, you can start the oral antibiotic, but I do not think you will need that based on exam tonight. Do not put rings back on that finger until all swelling has resolved. Your jeweler should be able to connect rings back together without difficulty.    Return to the clinic or go to the nearest emergency room if any of your symptoms worsen or new symptoms occur.   Abrasion An abrasion is a cut or scrape on the outer surface of your skin. An abrasion does not extend through all of the layers of your skin. It is important to care for your abrasion properly to prevent infection. CAUSES Most abrasions are caused by falling on or gliding across the ground or another surface. When your skin rubs on something, the outer and inner layer of skin rubs off.  SYMPTOMS A cut or scrape is the main symptom of this condition. The scrape may be bleeding, or it may appear red or pink. If there was an associated fall, there may be an underlying bruise. DIAGNOSIS An abrasion is diagnosed with a physical exam. TREATMENT Treatment for this condition depends on how large and deep the abrasion is. Usually, your abrasion will be cleaned with water and mild soap. This removes any dirt or debris that may be stuck. An antibiotic ointment may be applied to the abrasion to help prevent infection. A bandage (dressing) may be placed on the abrasion to keep it clean. You may also need a tetanus shot. HOME CARE INSTRUCTIONS Medicines  Take or apply medicines only as directed by your health care provider.  If you were prescribed an antibiotic ointment, finish all of it even if you start to feel better. Wound Care  Clean the wound with mild soap and water 2-3 times  per day or as directed by your health care provider. Pat your wound dry with a clean towel. Do not rub it.  There are many different ways to close and cover a wound. Follow instructions from your health care provider about:  Wound care.  Dressing changes and removal.  Check your wound every day for signs of infection. Watch for:  Redness, swelling, or pain.  Fluid, blood, or pus. General Instructions  Keep the dressing dry as directed by your health care provider. Do not take baths, swim, use a hot tub, or do anything that would put your wound underwater until your health care provider approves.  If there is swelling, raise (elevate) the injured area above the level of your heart while you are sitting or lying down.  Keep all follow-up visits as directed by your health care provider. This is important. SEEK MEDICAL CARE IF:  You received a tetanus shot and you have swelling, severe pain, redness, or bleeding at the injection site.  Your pain is not controlled with medicine.  You have increased redness, swelling, or pain at the site of your wound. SEEK IMMEDIATE MEDICAL CARE IF:  You have a red streak going away from your wound.  You have a fever.  You have fluid, blood, or pus coming from your wound.  You notice a bad smell coming from your wound or your dressing.   This information is not intended to replace advice  given to you by your health care provider. Make sure you discuss any questions you have with your health care provider.   Document Released: 01/18/2005 Document Revised: 12/30/2014 Document Reviewed: 04/08/2014 Elsevier Interactive Patient Education 2016 [employer].   IF you received an x-ray today, you will receive an invoice from [facility]. Please contact [facility] at [phone] with questions or concerns regarding your invoice.   IF you received labwork today, you will receive an invoice from [facility]. Please contact [facility] at [phone] with questions or concerns regarding your invoice.   Our billing staff will not be able to assist you with questions regarding bills from these companies.  You will be contacted with the lab results as soon as they are available. The fastest way to get your results is to activate your My Chart account. Instructions are located on the last page of this paperwork. If you have not heard from [place] regarding the results in 2 weeks, please contact this office.
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
## 2015-12-03 MED FILL — DOXYCYCLINE HYCLATE 100 MG: 100 | 10 days supply | Qty: 20 | Fill #0

## 2016-01-17 MED FILL — FUROSEMIDE 40 MG TABLET: 40 | 90 days supply | Qty: 90 | Fill #0

## 2016-01-17 MED FILL — PANTOPRAZOLE SOD DR 40 MG T: 40 | 90 days supply | Qty: 90 | Fill #3

## 2016-01-17 MED FILL — LEVOTHYROXINE 112 MCG TAB: 112 | 90 days supply | Qty: 90 | Fill #3

## 2016-01-25 DIAGNOSIS — H524 Presbyopia: Secondary | ICD-10-CM | POA: Diagnosis not present

## 2016-01-25 DIAGNOSIS — H5211 Myopia, right eye: Secondary | ICD-10-CM | POA: Diagnosis not present

## 2016-02-03 ENCOUNTER — Other Ambulatory Visit: Payer: Self-pay | Admitting: Obstetrics and Gynecology

## 2016-02-03 DIAGNOSIS — Z1231 Encounter for screening mammogram for malignant neoplasm of breast: Secondary | ICD-10-CM

## 2016-02-10 ENCOUNTER — Encounter: Payer: Self-pay | Admitting: Internal Medicine

## 2016-02-23 HISTORY — PX: NM MYOVIEW LTD: HXRAD82

## 2016-02-28 ENCOUNTER — Emergency Department (HOSPITAL_COMMUNITY): Payer: 59

## 2016-02-28 ENCOUNTER — Encounter (HOSPITAL_COMMUNITY): Payer: Self-pay | Admitting: Emergency Medicine

## 2016-02-28 ENCOUNTER — Emergency Department (HOSPITAL_COMMUNITY)
Admission: EM | Admit: 2016-02-28 | Discharge: 2016-02-28 | Disposition: A | Discharge status: Home / Self Care | Payer: 59 | Attending: Physician Assistant | Admitting: Physician Assistant

## 2016-02-28 DIAGNOSIS — K5790 Diverticulosis of intestine, part unspecified, without perforation or abscess without bleeding: Secondary | ICD-10-CM | POA: Diagnosis not present

## 2016-02-28 DIAGNOSIS — E039 Hypothyroidism, unspecified: Secondary | ICD-10-CM | POA: Insufficient documentation

## 2016-02-28 DIAGNOSIS — J45909 Unspecified asthma, uncomplicated: Secondary | ICD-10-CM | POA: Diagnosis not present

## 2016-02-28 DIAGNOSIS — Z79899 Other long term (current) drug therapy: Secondary | ICD-10-CM | POA: Insufficient documentation

## 2016-02-28 DIAGNOSIS — R933 Abnormal findings on diagnostic imaging of other parts of digestive tract: Secondary | ICD-10-CM | POA: Diagnosis not present

## 2016-02-28 DIAGNOSIS — R0789 Other chest pain: Secondary | ICD-10-CM | POA: Diagnosis not present

## 2016-02-28 DIAGNOSIS — Z87891 Personal history of nicotine dependence: Secondary | ICD-10-CM | POA: Diagnosis not present

## 2016-02-28 DIAGNOSIS — R079 Chest pain, unspecified: Secondary | ICD-10-CM | POA: Diagnosis not present

## 2016-02-28 DIAGNOSIS — Z91040 Latex allergy status: Secondary | ICD-10-CM | POA: Insufficient documentation

## 2016-02-28 DIAGNOSIS — R0781 Pleurodynia: Secondary | ICD-10-CM | POA: Diagnosis not present

## 2016-02-28 DIAGNOSIS — R0602 Shortness of breath: Secondary | ICD-10-CM | POA: Diagnosis present

## 2016-02-28 LAB — COMPREHENSIVE METABOLIC PANEL
ALT: 24 U/L (ref 14–54)
AST: 26 U/L (ref 15–41)
Albumin: 4.7 g/dL (ref 3.5–5.0)
Alkaline Phosphatase: 103 U/L (ref 38–126)
Anion gap: 4 — ABNORMAL LOW (ref 5–15)
BUN: 12 mg/dL (ref 6–20)
CO2: 27 mmol/L (ref 22–32)
Calcium: 9.2 mg/dL (ref 8.9–10.3)
Chloride: 106 mmol/L (ref 101–111)
Creatinine, Ser: 0.73 mg/dL (ref 0.44–1.00)
GFR calc Af Amer: >60 mL/min (ref >60)
GFR calc non Af Amer: >60 mL/min (ref >60)
Glucose, Bld: 98 mg/dL (ref 65–99)
Potassium: 3.8 mmol/L (ref 3.5–5.1)
Sodium: 137 mmol/L (ref 135–145)
Total Bilirubin: 0.8 mg/dL (ref 0.3–1.2)
Total Protein: 8.4 g/dL — ABNORMAL HIGH (ref 6.5–8.1)

## 2016-02-28 LAB — URINALYSIS, ROUTINE W REFLEX MICROSCOPIC
Bilirubin Urine: NEGATIVE
Glucose, UA: NEGATIVE mg/dL
Hgb urine dipstick: NEGATIVE
Ketones, ur: NEGATIVE mg/dL
Nitrite: NEGATIVE
Protein, ur: NEGATIVE mg/dL
Specific Gravity, Urine: 1.01 (ref 1.005–1.030)
pH: 6 (ref 5.0–8.0)

## 2016-02-28 LAB — CBC
HCT: 41.4 % (ref 36.0–46.0)
Hemoglobin: 14.5 g/dL (ref 12.0–15.0)
MCH: 31.4 pg (ref 26.0–34.0)
MCHC: 35 g/dL (ref 30.0–36.0)
MCV: 89.6 fL (ref 78.0–100.0)
Platelets: 207 10*3/uL (ref 150–400)
RBC: 4.62 MIL/uL (ref 3.87–5.11)
RDW: 13 % (ref 11.5–15.5)
WBC: 9.7 10*3/uL (ref 4.0–10.5)

## 2016-02-28 LAB — URINE MICROSCOPIC-ADD ON
Bacteria, UA: NONE SEEN
RBC / HPF: NONE SEEN RBC/hpf (ref 0–5)

## 2016-02-28 LAB — I-STAT TROPONIN, ED: Troponin i, poc: 0 ng/mL (ref 0.00–0.08)

## 2016-02-28 LAB — D-DIMER, QUANTITATIVE (NOT AT ARMC): D-Dimer, Quant: 0.78 ug/mL-FEU — ABNORMAL HIGH (ref 0.00–0.50)

## 2016-02-28 MED ORDER — FENTANYL CITRATE (PF) 100 MCG/2ML IJ SOLN
50.0000 ug | Freq: Once | INTRAMUSCULAR | Status: AC
  Administered 2016-02-28: 50 ug via INTRAVENOUS
  Filled 2016-02-28: qty 2

## 2016-02-28 MED ORDER — IOPAMIDOL (ISOVUE-370) INJECTION 76%
100.0000 mL | Freq: Once | INTRAVENOUS | Status: AC | PRN
  Administered 2016-02-28: 100 mL via INTRAVENOUS

## 2016-02-28 MED ORDER — CYCLOBENZAPRINE HCL 10 MG PO TABS
10.0000 mg | ORAL_TABLET | Freq: Once | ORAL | Status: AC
  Administered 2016-02-28: 10 mg via ORAL
  Filled 2016-02-28: qty 1

## 2016-02-28 MED ORDER — OXYCODONE-ACETAMINOPHEN 5-325 MG PO TABS
1.0000 | ORAL_TABLET | Freq: Once | ORAL | Status: AC
  Administered 2016-02-28: 1 via ORAL
  Filled 2016-02-28: qty 1

## 2016-02-28 MED ORDER — OXYCODONE-ACETAMINOPHEN 5-325 MG PO TABS: 1.0000 | ORAL_TABLET | Freq: Four times a day (QID) | ORAL | 0 refills | Status: DC | PRN

## 2016-02-28 MED ORDER — CYCLOBENZAPRINE HCL 10 MG PO TABS: 10.0000 mg | ORAL_TABLET | Freq: Two times a day (BID) | ORAL | 0 refills | Status: DC | PRN

## 2016-02-28 MED ORDER — ONDANSETRON HCL 4 MG PO TABS
4.0000 mg | ORAL_TABLET | Freq: Once | ORAL | Status: AC
  Administered 2016-02-28: 4 mg via ORAL
  Filled 2016-02-28: qty 1

## 2016-02-28 NOTE — ED Triage Notes (Addendum)
Pt employee of [employer]. C/o R side "rib cage pain" that radiates into central chest. Pain worsens with palpation, movement and deep breathing. C/o SOB, but sts that she just can't catch it due to pain. Pt c/o nausea. Pt c/o pain to palpation of R rib cage. Pt also c/o mild dizziness. A&Ox4 and ambulatory.

## 2016-02-28 NOTE — ED Notes (Signed)
Pt requesting additional pain meds   Will notify MD

## 2016-02-28 NOTE — ED Notes (Signed)
Delay on vital signs patient had to use the restroom and then IV was here to get an Iv

## 2016-02-28 NOTE — ED Notes (Addendum)
CT made aware that pt has new IV and is available for imaging.

## 2016-02-28 NOTE — ED Notes (Signed)
Pt lab delayed d/t difficult access.  MD and lab notified.

## 2016-02-28 NOTE — ED Notes (Signed)
RN at bedside attempting US IV.

## 2016-02-28 NOTE — Discharge Instructions (Signed)
We are unsure what is causing her pain today. However you're CAT scan shows no abnormalities in the chest or in the abdomen. The pain is worse with moving your arms, so it is likely musculoskeletal. We will give you some medications to help with it at home. Please return if you experience any concerning symptoms including fever or shortness of breath or vomiting.

## 2016-02-28 NOTE — ED Notes (Signed)
Lab attempting blood draw

## 2016-02-28 NOTE — ED Notes (Signed)
I attempted twice and was unsuccessful
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
## 2016-02-29 LAB — URINE CULTURE

## 2016-03-03 ENCOUNTER — Encounter: Payer: Self-pay | Admitting: Internal Medicine

## 2016-03-03 ENCOUNTER — Ambulatory Visit (INDEPENDENT_AMBULATORY_CARE_PROVIDER_SITE_OTHER): Payer: 59 | Admitting: Internal Medicine

## 2016-03-03 VITALS — BP 132/78 | HR 80 | Temp 98.1°F | Resp 20 | Wt 203.0 lb

## 2016-03-03 DIAGNOSIS — R040 Epistaxis: Secondary | ICD-10-CM | POA: Insufficient documentation

## 2016-03-03 DIAGNOSIS — R0781 Pleurodynia: Secondary | ICD-10-CM | POA: Insufficient documentation

## 2016-03-03 DIAGNOSIS — R079 Chest pain, unspecified: Secondary | ICD-10-CM

## 2016-03-03 NOTE — Assessment & Plan Note (Signed)
Resolved,  to f/u any worsening symptoms or concerns  
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
## 2016-03-03 NOTE — Progress Notes (Signed)
Pre visit review using our clinic review tool, if applicable. No additional management support is needed unless otherwise documented below in the visit note. 

## 2016-03-03 NOTE — Assessment & Plan Note (Signed)
apaprently improved, no further bleeding last 2 days, exam o/w benign, ok to follow

## 2016-03-03 NOTE — Assessment & Plan Note (Signed)
Atypical, recent ecg reviewed, d/w pt - for stress test,  to f/u any worsening symptoms or concerns, cont same meds

## 2016-03-03 NOTE — Patient Instructions (Signed)
Please continue all other medications as before, and refills have been done if requested.  Please have the pharmacy call with any other refills you may need.  Please continue your efforts at being more active, low cholesterol diet, and weight control..  Please keep your appointments with your specialists as you may have planned  You will be contacted regarding the referral for: stress test  Please call for ENT referral for further nosebleed

## 2016-03-07 ENCOUNTER — Telehealth: Payer: Self-pay | Admitting: Internal Medicine

## 2016-03-07 NOTE — Telephone Encounter (Signed)
Called patient to inform her that we do not have any FMLA paperwork

## 2016-03-07 NOTE — Telephone Encounter (Signed)
Patient would like to know if FMLA forms were received today

## 2016-03-07 NOTE — Telephone Encounter (Signed)
I have not received FMLA forms for this patient. Forwarding to Dr Gwynn Burly assistant

## 2016-03-10 ENCOUNTER — Telehealth: Payer: Self-pay

## 2016-03-10 NOTE — Telephone Encounter (Signed)
Pt requested continuous FMLA from 02/28/2016 - 03/02/2016 related to her ER visit on 02/28/2016 and subsequent OV on 03/03/2016. Request was DECLINED - PCP felt FMLA was inappropriate for Dx but did offer an out of work note. Pt advised of same but declined stated that this would not be accepted by her employer.

## 2016-03-14 ENCOUNTER — Telehealth (HOSPITAL_COMMUNITY): Payer: Self-pay | Admitting: *Deleted

## 2016-03-14 NOTE — Telephone Encounter (Signed)
Left message on voicemail per DPR in reference to upcoming appointment scheduled on 03/21/16 at [facility] with detailed instructions given per Myocardial Perfusion Study Information Sheet for the test. LM to arrive 15 minutes early, and that it is imperative to arrive on time for appointment to keep from having the test rescheduled. If you need to cancel or reschedule your appointment, please call the office within 24 hours of your appointment. Failure to do so may result in a cancellation of your appointment, and a $50 no show fee. Phone number given for call back for any questions.

## 2016-03-21 ENCOUNTER — Ambulatory Visit (HOSPITAL_COMMUNITY): Payer: 59 | Attending: Internal Medicine

## 2016-03-21 DIAGNOSIS — R079 Chest pain, unspecified: Secondary | ICD-10-CM | POA: Insufficient documentation

## 2016-03-21 LAB — MYOCARDIAL PERFUSION IMAGING
Estimated workload: 6.4 METS
Exercise duration (min): 3 min
Exercise duration (sec): 50 s
LV dias vol: 76 mL (ref 46–106)
LV sys vol: 27 mL
MPHR: 162 {beats}/min
Peak HR: 111 {beats}/min
Percent HR: 69 %
RATE: 0.35
RPE: 19
Rest HR: 65 {beats}/min
SDS: 1
SRS: 6
SSS: 7
TID: 1.19

## 2016-03-21 MED ORDER — TECHNETIUM TC 99M TETROFOSMIN IV KIT
10.3000 | PACK | Freq: Once | INTRAVENOUS | Status: AC | PRN
  Administered 2016-03-21: 10.3 via INTRAVENOUS
  Filled 2016-03-21: qty 11

## 2016-03-21 MED ORDER — REGADENOSON 0.4 MG/5ML IV SOLN
0.4000 mg | Freq: Once | INTRAVENOUS | Status: AC
  Administered 2016-03-21: 0.4 mg via INTRAVENOUS

## 2016-03-21 MED ORDER — TECHNETIUM TC 99M TETROFOSMIN IV KIT
32.5000 | PACK | Freq: Once | INTRAVENOUS | Status: AC | PRN
  Administered 2016-03-21: 32.5 via INTRAVENOUS
  Filled 2016-03-21: qty 33

## 2016-04-07 ENCOUNTER — Other Ambulatory Visit (INDEPENDENT_AMBULATORY_CARE_PROVIDER_SITE_OTHER): Payer: 59

## 2016-04-07 DIAGNOSIS — E039 Hypothyroidism, unspecified: Secondary | ICD-10-CM

## 2016-04-07 DIAGNOSIS — Z0000 Encounter for general adult medical examination without abnormal findings: Secondary | ICD-10-CM

## 2016-04-07 LAB — HEPATIC FUNCTION PANEL
ALT: 19 U/L (ref 0–35)
AST: 18 U/L (ref 0–37)
Albumin: 4.1 g/dL (ref 3.5–5.2)
Alkaline Phosphatase: 92 U/L (ref 39–117)
Bilirubin, Direct: 0.1 mg/dL (ref 0.0–0.3)
Total Bilirubin: 0.4 mg/dL (ref 0.2–1.2)
Total Protein: 6.8 g/dL (ref 6.0–8.3)

## 2016-04-07 LAB — LIPID PANEL
Cholesterol: 137 mg/dL (ref 0–200)
HDL: 49.4 mg/dL (ref >39.00)
LDL Cholesterol: 74 mg/dL (ref 0–99)
NonHDL: 87.54
Total CHOL/HDL Ratio: 3
Triglycerides: 68 mg/dL (ref 0.0–149.0)
VLDL: 13.6 mg/dL (ref 0.0–40.0)

## 2016-04-07 LAB — CBC WITH DIFFERENTIAL/PLATELET
Basophils Absolute: 0 10*3/uL (ref 0.0–0.1)
Basophils Relative: 0.8 % (ref 0.0–3.0)
Eosinophils Absolute: 0.4 10*3/uL (ref 0.0–0.7)
Eosinophils Relative: 6.7 % — ABNORMAL HIGH (ref 0.0–5.0)
HCT: 41.8 % (ref 36.0–46.0)
Hemoglobin: 14.4 g/dL (ref 12.0–15.0)
Lymphocytes Relative: 28.4 % (ref 12.0–46.0)
Lymphs Abs: 1.5 10*3/uL (ref 0.7–4.0)
MCHC: 34.4 g/dL (ref 30.0–36.0)
MCV: 92 fl (ref 78.0–100.0)
Monocytes Absolute: 0.3 10*3/uL (ref 0.1–1.0)
Monocytes Relative: 5.5 % (ref 3.0–12.0)
Neutro Abs: 3.2 10*3/uL (ref 1.4–7.7)
Neutrophils Relative %: 58.6 % (ref 43.0–77.0)
Platelets: 208 10*3/uL (ref 150.0–400.0)
RBC: 4.54 Mil/uL (ref 3.87–5.11)
RDW: 13.2 % (ref 11.5–15.5)
WBC: 5.4 10*3/uL (ref 4.0–10.5)

## 2016-04-07 LAB — BASIC METABOLIC PANEL
BUN: 12 mg/dL (ref 6–23)
CO2: 25 mEq/L (ref 19–32)
Calcium: 9.2 mg/dL (ref 8.4–10.5)
Chloride: 106 mEq/L (ref 96–112)
Creatinine, Ser: 0.88 mg/dL (ref 0.40–1.20)
GFR: 70.04 mL/min (ref >60.00)
Glucose, Bld: 100 mg/dL — ABNORMAL HIGH (ref 70–99)
Potassium: 4.3 mEq/L (ref 3.5–5.1)
Sodium: 140 mEq/L (ref 135–145)

## 2016-04-07 LAB — T4, FREE: Free T4: 0.93 ng/dL (ref 0.60–1.60)

## 2016-04-07 LAB — TSH: TSH: 1.53 u[IU]/mL (ref 0.35–4.50)

## 2016-04-18 ENCOUNTER — Encounter: Payer: 59 | Admitting: Internal Medicine

## 2016-04-18 ENCOUNTER — Encounter: Payer: Self-pay | Admitting: Internal Medicine

## 2016-04-18 ENCOUNTER — Ambulatory Visit (INDEPENDENT_AMBULATORY_CARE_PROVIDER_SITE_OTHER): Payer: 59 | Admitting: Internal Medicine

## 2016-04-18 ENCOUNTER — Telehealth: Payer: Self-pay

## 2016-04-18 DIAGNOSIS — Z0000 Encounter for general adult medical examination without abnormal findings: Secondary | ICD-10-CM | POA: Diagnosis not present

## 2016-04-18 MED ORDER — LEVOTHYROXINE SODIUM 112 MCG PO TABS: 112.0000 ug | ORAL_TABLET | Freq: Every day | ORAL | 3 refills | Status: DC

## 2016-04-18 MED ORDER — PANTOPRAZOLE SODIUM 40 MG PO TBEC: 40.0000 mg | DELAYED_RELEASE_TABLET | Freq: Every day | ORAL | 3 refills | Status: DC

## 2016-04-18 MED ORDER — CYCLOBENZAPRINE HCL 10 MG PO TABS: 10.0000 mg | ORAL_TABLET | Freq: Every evening | ORAL | 5 refills | Status: DC | PRN

## 2016-04-18 MED ORDER — ALBUTEROL SULFATE HFA 108 (90 BASE) MCG/ACT IN AERS: 2.0000 | INHALATION_SPRAY | Freq: Four times a day (QID) | RESPIRATORY_TRACT | 5 refills | Status: DC | PRN

## 2016-04-18 MED ORDER — ALPRAZOLAM 0.25 MG PO TABS: 0.2500 mg | ORAL_TABLET | Freq: Two times a day (BID) | ORAL | 0 refills | Status: DC | PRN

## 2016-04-18 MED ORDER — ALPRAZOLAM 0.25 MG PO TABS
0.2500 mg | ORAL_TABLET | Freq: Two times a day (BID) | ORAL | 0 refills | Status: DC | PRN
Start: 2016-04-18 — End: 2017-03-01

## 2016-04-18 MED ORDER — FUROSEMIDE 40 MG PO TABS: 40.0000 mg | ORAL_TABLET | Freq: Every day | ORAL | 3 refills | Status: DC

## 2016-04-18 MED FILL — ALPRAZolam 0.25 MG TABS: 0.25 | 20 days supply | Qty: 40 | Fill #0

## 2016-04-18 MED FILL — CYCLOBENZAPRINE 10 MG TAB: 10 | 30 days supply | Qty: 30 | Fill #0
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
## 2016-04-18 NOTE — Assessment & Plan Note (Signed)

## 2016-04-18 NOTE — Telephone Encounter (Signed)
Refills sent to pharmacy. 

## 2016-04-18 NOTE — Patient Instructions (Signed)
Please continue all other medications as before, and refills have been done if requested.  Please have the pharmacy call with any other refills you may need.  Please continue your efforts at being more active, low cholesterol diet, and weight control.  You are otherwise up to date with prevention measures today.  Please keep your appointments with your specialists as you may have planned  Please return in 1 year for your yearly visit, or sooner if needed, with Lab testing done 3-5 days before  

## 2016-04-18 NOTE — Progress Notes (Signed)
Pre visit review using our clinic review tool, if applicable. No additional management support is needed unless otherwise documented below in the visit note. 

## 2016-04-26 MED FILL — VENTOLIN HFA 90 MCG INHALER: 108 (90 BAS | 25 days supply | Qty: 18 | Fill #0

## 2016-04-26 MED FILL — FUROSEMIDE 40 MG TABLET: 40 | 90 days supply | Qty: 90 | Fill #0

## 2016-04-26 MED FILL — LEVOTHYROXINE 112 MCG TAB: 112 | 90 days supply | Qty: 90 | Fill #0

## 2016-04-26 MED FILL — PANTOPRAZOLE SOD DR 40 MG T: 40 | 90 days supply | Qty: 90 | Fill #0

## 2016-04-27 ENCOUNTER — Ambulatory Visit
Admission: RE | Admit: 2016-04-27 | Discharge: 2016-04-27 | Disposition: A | Discharge status: Home / Self Care | Payer: 59 | Source: Ambulatory Visit | Attending: Obstetrics and Gynecology | Admitting: Obstetrics and Gynecology

## 2016-04-27 DIAGNOSIS — Z1231 Encounter for screening mammogram for malignant neoplasm of breast: Secondary | ICD-10-CM

## 2016-04-28 ENCOUNTER — Other Ambulatory Visit: Payer: Self-pay | Admitting: Obstetrics and Gynecology

## 2016-04-28 DIAGNOSIS — R928 Other abnormal and inconclusive findings on diagnostic imaging of breast: Secondary | ICD-10-CM

## 2016-05-04 DIAGNOSIS — Z01419 Encounter for gynecological examination (general) (routine) without abnormal findings: Secondary | ICD-10-CM | POA: Diagnosis not present

## 2016-05-04 DIAGNOSIS — Z6834 Body mass index (BMI) 34.0-34.9, adult: Secondary | ICD-10-CM | POA: Diagnosis not present

## 2016-05-05 ENCOUNTER — Ambulatory Visit
Admission: RE | Admit: 2016-05-05 | Discharge: 2016-05-05 | Disposition: A | Discharge status: Home / Self Care | Payer: 59 | Source: Ambulatory Visit | Attending: Obstetrics and Gynecology | Admitting: Obstetrics and Gynecology

## 2016-05-05 DIAGNOSIS — N6320 Unspecified lump in the left breast, unspecified quadrant: Secondary | ICD-10-CM | POA: Diagnosis not present

## 2016-05-05 DIAGNOSIS — N6012 Diffuse cystic mastopathy of left breast: Secondary | ICD-10-CM | POA: Diagnosis not present

## 2016-05-05 DIAGNOSIS — N6310 Unspecified lump in the right breast, unspecified quadrant: Secondary | ICD-10-CM | POA: Diagnosis not present

## 2016-05-05 DIAGNOSIS — R928 Other abnormal and inconclusive findings on diagnostic imaging of breast: Secondary | ICD-10-CM

## 2016-05-05 DIAGNOSIS — N6011 Diffuse cystic mastopathy of right breast: Secondary | ICD-10-CM | POA: Diagnosis not present

## 2016-05-27 ENCOUNTER — Encounter (HOSPITAL_COMMUNITY): Payer: Self-pay | Admitting: *Deleted

## 2016-05-27 ENCOUNTER — Ambulatory Visit (HOSPITAL_COMMUNITY)
Admission: EM | Admit: 2016-05-27 | Discharge: 2016-05-27 | Disposition: A | Discharge status: Home / Self Care | Payer: 59 | Attending: Family Medicine | Admitting: Family Medicine

## 2016-05-27 DIAGNOSIS — G44009 Cluster headache syndrome, unspecified, not intractable: Secondary | ICD-10-CM

## 2016-05-27 DIAGNOSIS — J3489 Other specified disorders of nose and nasal sinuses: Secondary | ICD-10-CM | POA: Diagnosis not present

## 2016-05-27 DIAGNOSIS — R6889 Other general symptoms and signs: Secondary | ICD-10-CM

## 2016-05-27 DIAGNOSIS — R05 Cough: Secondary | ICD-10-CM

## 2016-05-27 DIAGNOSIS — R509 Fever, unspecified: Secondary | ICD-10-CM | POA: Diagnosis not present

## 2016-05-27 MED ORDER — BENZONATATE 100 MG PO CAPS: 100.0000 mg | ORAL_CAPSULE | Freq: Three times a day (TID) | ORAL | 0 refills | Status: DC

## 2016-05-27 MED ORDER — OSELTAMIVIR PHOSPHATE 75 MG PO CAPS: 75.0000 mg | ORAL_CAPSULE | Freq: Two times a day (BID) | ORAL | 0 refills | Status: DC

## 2016-05-27 MED ORDER — ONDANSETRON 4 MG PO TBDP: 4.0000 mg | ORAL_TABLET | Freq: Three times a day (TID) | ORAL | 0 refills | Status: DC | PRN
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
## 2016-05-27 NOTE — Discharge Instructions (Signed)
You most likely have a flu like illness, I advise rest, plenty of fluids and management of symptoms with over the counter medicines. For symptoms you may take Tylenol as needed every 4-6 hours for body aches or fever, not to exceed 4,000 mg a day, Take mucinex or mucinex DM ever 12 hours with a full glass of water, you may use an inhaled steroid such as Flonase, 2 sprays each nostril once a day for congestion, or an antihistamine such as Claritin or Zyrtec once a day.   In addition the these therapies. I have prescribed Tamiflu, take 1 tablet twice a day for 5 days. For nausea, take Zofran 1 tablet under the tongue every 8 hours as needed. For cough, take 1 tablet of Tessalon every 8 hours as needed.  For your asthma symptoms, if you continue to need to use your rescue inhaler, more than 4 times in a day, or if you use your inhaler and do not get any relief, consider going to the emergency room.  Should your symptoms worsen or fail to resolve, follow up with your primary care provider or return to clinic.

## 2016-07-24 MED FILL — PANTOPRAZOLE SOD DR 40 MG T: 40 | 90 days supply | Qty: 90 | Fill #1

## 2016-07-24 MED FILL — LEVOTHYROXINE 112 MCG TAB: 112 | 90 days supply | Qty: 90 | Fill #1

## 2016-08-08 ENCOUNTER — Encounter: Payer: Self-pay | Admitting: Internal Medicine

## 2016-08-09 ENCOUNTER — Ambulatory Visit (HOSPITAL_COMMUNITY)
Admission: EM | Admit: 2016-08-09 | Discharge: 2016-08-09 | Disposition: A | Discharge status: Home / Self Care | Payer: 59 | Attending: Family Medicine | Admitting: Family Medicine

## 2016-08-09 ENCOUNTER — Encounter (HOSPITAL_COMMUNITY): Payer: Self-pay | Admitting: Emergency Medicine

## 2016-08-09 DIAGNOSIS — M5440 Lumbago with sciatica, unspecified side: Secondary | ICD-10-CM | POA: Diagnosis not present

## 2016-08-09 MED ORDER — PREDNISONE 10 MG (21) PO TBPK: ORAL_TABLET | ORAL | 0 refills | Status: DC

## 2016-08-09 MED ORDER — HYDROCODONE-ACETAMINOPHEN 5-325 MG PO TABS: 1.0000 | ORAL_TABLET | ORAL | 0 refills | Status: DC | PRN

## 2016-08-09 NOTE — ED Triage Notes (Signed)
Pt. Stated, I started having back pain , not sure how it started.
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
## 2016-08-09 NOTE — ED Triage Notes (Signed)
Pt. Having difficult walking

## 2016-08-15 ENCOUNTER — Ambulatory Visit (INDEPENDENT_AMBULATORY_CARE_PROVIDER_SITE_OTHER): Payer: 59 | Admitting: Internal Medicine

## 2016-08-15 ENCOUNTER — Encounter: Payer: Self-pay | Admitting: Internal Medicine

## 2016-08-15 VITALS — BP 112/66 | HR 83 | Ht 63.5 in | Wt 198.0 lb

## 2016-08-15 DIAGNOSIS — M5416 Radiculopathy, lumbar region: Secondary | ICD-10-CM | POA: Diagnosis not present

## 2016-08-15 MED ORDER — CYCLOBENZAPRINE HCL 10 MG PO TABS: ORAL_TABLET | ORAL | 2 refills | Status: DC

## 2016-08-15 MED ORDER — HYDROCODONE-ACETAMINOPHEN 5-325 MG PO TABS
1.0000 | ORAL_TABLET | ORAL | 0 refills | Status: DC | PRN
Start: 2016-08-15 — End: 2016-09-01
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
## 2016-08-15 NOTE — Progress Notes (Signed)
Pre visit review using our clinic review tool, if applicable. No additional management support is needed unless otherwise documented below in the visit note. 

## 2016-08-15 NOTE — Patient Instructions (Addendum)
Please take all new medication as prescribed - the norco refill, and the muscle relaxer increased to three times daily as needed  You will be contacted regarding the referral for: MRI for the lower back, and the Dr Vertell Limber referral  Please continue all other medications as before, and refills have been done if requested.  Please have the pharmacy call with any other refills you may need.  Please keep your appointments with your specialists as you may have planned  You are given the work note

## 2016-08-15 NOTE — Assessment & Plan Note (Addendum)
New worsening neuro changes with lumbar pain and LE weakness as described, for MRI LS spine,  Refer Dr Vertell Limber per pt request as her husband had good results there, for pain med refill, muscle relaxer trial prn, hold further predpac at this time, given work note for limited duty

## 2016-08-26 ENCOUNTER — Ambulatory Visit
Admission: RE | Admit: 2016-08-26 | Discharge: 2016-08-26 | Disposition: A | Discharge status: Home / Self Care | Payer: 59 | Source: Ambulatory Visit | Attending: Internal Medicine | Admitting: Internal Medicine

## 2016-08-26 ENCOUNTER — Encounter: Payer: Self-pay | Admitting: Internal Medicine

## 2016-08-26 DIAGNOSIS — M5416 Radiculopathy, lumbar region: Secondary | ICD-10-CM

## 2016-08-26 DIAGNOSIS — M48061 Spinal stenosis, lumbar region without neurogenic claudication: Secondary | ICD-10-CM | POA: Diagnosis not present

## 2016-09-01 ENCOUNTER — Telehealth: Payer: Self-pay

## 2016-09-01 ENCOUNTER — Telehealth: Payer: Self-pay | Admitting: Internal Medicine

## 2016-09-01 ENCOUNTER — Encounter: Payer: Self-pay | Admitting: Internal Medicine

## 2016-09-01 MED ORDER — HYDROCODONE-ACETAMINOPHEN 5-325 MG PO TABS: 1.0000 | ORAL_TABLET | ORAL | 0 refills | Status: DC | PRN

## 2016-09-01 MED FILL — HYDROCODON-APAP 5-325: 5-325 | 3 days supply | Qty: 30 | Fill #0

## 2016-09-01 NOTE — Telephone Encounter (Signed)
Norco script had to be redone for Marya Amsler to sign off on. Dr. Jenny Reichmann left before he had a chance to sign original script.

## 2016-09-01 NOTE — Telephone Encounter (Signed)
Pt would like a refill of HYDROcodone-acetaminophen (NORCO/VICODIN) 5-325 MG tablet

## 2016-09-01 NOTE — Telephone Encounter (Signed)
Called pt, LVM informing her that script is ready for pick up and at the front desk.

## 2016-09-01 NOTE — Telephone Encounter (Signed)
Done hardcopy to Shirron  

## 2016-09-04 ENCOUNTER — Encounter: Payer: Self-pay | Admitting: Internal Medicine

## 2016-09-14 ENCOUNTER — Telehealth: Payer: Self-pay | Admitting: Internal Medicine

## 2016-09-14 MED ORDER — HYDROCODONE-ACETAMINOPHEN 5-325 MG PO TABS: 1.0000 | ORAL_TABLET | Freq: Four times a day (QID) | ORAL | 0 refills | Status: DC | PRN

## 2016-09-14 MED FILL — HYDROCODON-APAP 5-325: 5-325 | 4 days supply | Qty: 30 | Fill #0

## 2016-09-14 NOTE — Telephone Encounter (Signed)
Patient is aware. She will make an office visit when she comes to be rx up.

## 2016-09-14 NOTE — Telephone Encounter (Signed)
[place] for refill, but will need ROV for any further  Done hardcopy to shirron

## 2016-09-14 NOTE — Telephone Encounter (Signed)
Pt has been informed and expressed understanding.  

## 2016-09-14 NOTE — Telephone Encounter (Signed)
HYDROcodone-acetaminophen (NORCO/VICODIN) 5-325 MG   Patient came into office requesting a refill on this medication. She states she still has a lot of pain. She states she only has one left. She would like it filled before the weekend. Please advise.

## 2016-09-19 ENCOUNTER — Ambulatory Visit (INDEPENDENT_AMBULATORY_CARE_PROVIDER_SITE_OTHER): Payer: 59 | Admitting: Internal Medicine

## 2016-09-19 ENCOUNTER — Encounter: Payer: Self-pay | Admitting: Internal Medicine

## 2016-09-19 VITALS — BP 112/70 | HR 97 | Ht 63.5 in | Wt 201.0 lb

## 2016-09-19 DIAGNOSIS — M5416 Radiculopathy, lumbar region: Secondary | ICD-10-CM | POA: Diagnosis not present

## 2016-09-19 NOTE — Patient Instructions (Signed)
Please continue all other medications as before, and refills can be done if requested such as the hydrocodone if needed  Please have the pharmacy call with any other refills you may need.  Please keep your appointments with your specialists as you may have planned - Neurosurgury

## 2016-09-19 NOTE — Assessment & Plan Note (Signed)
Persistent, cont pain control, hold on PT for now, muscle relaxer no help,declines predpac, to f/u NS soonest as possible
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
## 2016-09-25 ENCOUNTER — Telehealth: Payer: Self-pay | Admitting: Internal Medicine

## 2016-09-25 NOTE — Telephone Encounter (Signed)
MD out of office will be here tomorrow will forward for his approval.../lmb

## 2016-09-25 NOTE — Telephone Encounter (Signed)
Pt walked in and needs a refill of her HYDROcodone-acetaminophen (NORCO/VICODIN) 5-325 MG tablet She states she is taking per 3 day. Please advise.

## 2016-09-26 MED ORDER — HYDROCODONE-ACETAMINOPHEN 5-325 MG PO TABS: 1.0000 | ORAL_TABLET | Freq: Three times a day (TID) | ORAL | 0 refills | Status: DC | PRN

## 2016-09-26 MED FILL — HYDROCODON-APAP 5-325: 5-325 | 20 days supply | Qty: 60 | Fill #0

## 2016-09-26 NOTE — Telephone Encounter (Signed)
Done hardcopy to Shirron  

## 2016-09-26 NOTE — Telephone Encounter (Signed)
Informed pt Script at front desk  

## 2016-10-12 DIAGNOSIS — M549 Dorsalgia, unspecified: Secondary | ICD-10-CM | POA: Diagnosis not present

## 2016-10-12 DIAGNOSIS — M5126 Other intervertebral disc displacement, lumbar region: Secondary | ICD-10-CM | POA: Diagnosis not present

## 2016-10-12 DIAGNOSIS — M5416 Radiculopathy, lumbar region: Secondary | ICD-10-CM | POA: Diagnosis not present

## 2016-10-12 DIAGNOSIS — M4316 Spondylolisthesis, lumbar region: Secondary | ICD-10-CM | POA: Diagnosis not present

## 2016-10-12 MED FILL — tiZANidine HCL 4 MG TABS: 4 | 20 days supply | Qty: 60 | Fill #0

## 2016-10-12 MED FILL — METHYLPREDNISOLONE 4 MG TAB: 4 | 6 days supply | Qty: 21 | Fill #0

## 2016-10-12 MED FILL — OXYCODONE-ACETAMINOPHEN 10-: 10-325 | 15 days supply | Qty: 60 | Fill #0

## 2016-10-23 DIAGNOSIS — M549 Dorsalgia, unspecified: Secondary | ICD-10-CM | POA: Diagnosis not present

## 2016-10-23 DIAGNOSIS — M7061 Trochanteric bursitis, right hip: Secondary | ICD-10-CM | POA: Diagnosis not present

## 2016-10-23 DIAGNOSIS — M4316 Spondylolisthesis, lumbar region: Secondary | ICD-10-CM | POA: Diagnosis not present

## 2016-10-23 DIAGNOSIS — M5416 Radiculopathy, lumbar region: Secondary | ICD-10-CM | POA: Diagnosis not present

## 2016-10-23 DIAGNOSIS — Z6834 Body mass index (BMI) 34.0-34.9, adult: Secondary | ICD-10-CM | POA: Diagnosis not present

## 2016-10-23 MED FILL — PANTOPRAZOLE SOD DR 40 MG T: 40 | 90 days supply | Qty: 90 | Fill #2

## 2016-10-23 MED FILL — LEVOTHYROXINE 112 MCG TAB: 112 | 90 days supply | Qty: 90 | Fill #2

## 2016-12-03 ENCOUNTER — Ambulatory Visit (HOSPITAL_COMMUNITY)
Admission: EM | Admit: 2016-12-03 | Discharge: 2016-12-03 | Disposition: A | Discharge status: Home / Self Care | Payer: 59 | Attending: Family Medicine | Admitting: Family Medicine

## 2016-12-03 ENCOUNTER — Encounter (HOSPITAL_COMMUNITY): Payer: Self-pay | Admitting: Emergency Medicine

## 2016-12-03 DIAGNOSIS — R21 Rash and other nonspecific skin eruption: Secondary | ICD-10-CM | POA: Diagnosis not present

## 2016-12-03 MED ORDER — TRIAMCINOLONE ACETONIDE 0.1 % EX CREA: 1.0000 "application " | TOPICAL_CREAM | Freq: Two times a day (BID) | CUTANEOUS | 0 refills | Status: DC

## 2016-12-03 NOTE — ED Triage Notes (Signed)
The patient presented to the [facility] with a complaint of a rash that she believed to be due to a sunburn for 3 days.

## 2016-12-03 NOTE — ED Provider Notes (Signed)
  [place]   [id number] 12/03/16 Arrival Time: 0037  ASSESSMENT & PLAN:  1. Rash     Meds ordered this encounter  Medications  . triamcinolone cream (KENALOG) 0.1 %    Sig: Apply 1 application topically 2 (two) times daily.    Dispense:  30 g    Refill:  0    Order Specific Question:   Supervising Provider    Answer:   Robyn Haber [[id number]]    Will treat for a contact dermatitis, follow-up with primary care in 1 week as needed Reviewed expectations re: course of current medical issues. Questions answered. Outlined signs and symptoms indicating need for more acute intervention. Patient verbalized understanding. After Visit Summary given.   SUBJECTIVE:  [name] is a 61 y.o. female who presents with complaint of rash on her lower legs, and her lower arms. She was at the beach earlier this week and the rash broke out after she returned home. She denies any hot tub use but she was in the pool, in the ocean water. No new soaps or detergents, no new foods, she has used antihistamines, and over-the-counter hydrocortisone cream with minimal relief. No fever, chills, or other systemic symptoms.  ROS: As per HPI, remainder of ROS negative.   OBJECTIVE:  Vitals:   12/03/16 1216  BP: (!) 158/92  Pulse: 76  Resp: 18  Temp: 98.9 F (37.2 C)  TempSrc: Oral  SpO2: 98%     General appearance: alert; no distress HEENT: normocephalic; atraumatic; conjunctivae normal;  Extremities: no cyanosis or edema; symmetrical with no gross deformities Skin: warm and dry, multiple small, erythemic papules that are blanchable on the lower knees and distally bilaterally, and elbows distally towards the arm and hand. Neurologic: Grossly normal Psychological:  alert and cooperative; normal mood and affect  Procedures:     Labs Reviewed - No data to display  No results found.  Allergies  Allergen Reactions  . Amoxicillin Shortness Of Breath and Rash    REACTION:  rash, sob - tol kelfex and rocephn hosp 06/2009 Has patient had a PCN reaction causing immediate rash, facial/tongue/throat swelling, SOB or lightheadedness with hypotension: YES Has patient had a PCN reaction causing severe rash involving mucus membranes or skin necrosis: YES Has patient had a PCN reaction that required hospitalization UNKNOWN Has patient had a PCN reaction occurring within the last 10 years: NO If all of the above answers are "NO", then may proceed with Cephalosporin use  . Aspirin Shortness Of Breath and Rash  . Latex Shortness Of Breath and Dermatitis    Blisters on skin  . Sulfonamide Derivatives Shortness Of Breath and Rash    REACTION: ?rash, sob - but reports bactrim tolerence  . Avelox [Moxifloxacin Hcl In Nacl] Hives    TOLERATES CIPRO    PMHx, SurgHx, SocialHx, Medications, and Allergies were reviewed in the Visit Navigator and updated as appropriate.       Barnet Glasgow, NP 12/03/16 1227

## 2016-12-03 NOTE — Discharge Instructions (Signed)
This is a contact, or irritant-type of dermatitis. I prescribed triamcinolone cream, apply 2-3 times a day as needed, if rash persists past one week follow up with your primary care provider

## 2016-12-10 DIAGNOSIS — B354 Tinea corporis: Secondary | ICD-10-CM | POA: Diagnosis not present

## 2016-12-10 DIAGNOSIS — L309 Dermatitis, unspecified: Secondary | ICD-10-CM | POA: Diagnosis not present

## 2016-12-19 ENCOUNTER — Other Ambulatory Visit: Payer: Self-pay | Admitting: Internal Medicine

## 2016-12-19 MED ORDER — HYDROCODONE-ACETAMINOPHEN 5-325 MG PO TABS: 1.0000 | ORAL_TABLET | Freq: Three times a day (TID) | ORAL | 0 refills | Status: DC | PRN

## 2016-12-19 NOTE — Telephone Encounter (Signed)
Done hardcopy to Shirron  

## 2016-12-20 MED FILL — HYDROCODON-APAP 5-325: 5-325 | 20 days supply | Qty: 60 | Fill #0

## 2016-12-20 NOTE — Telephone Encounter (Signed)
Script at front desk.

## 2016-12-20 NOTE — Telephone Encounter (Signed)
Pt advised this is ready and to come and pick it up.

## 2016-12-28 ENCOUNTER — Encounter: Payer: Self-pay | Admitting: Gastroenterology

## 2017-01-24 ENCOUNTER — Other Ambulatory Visit: Payer: Self-pay | Admitting: Obstetrics and Gynecology

## 2017-01-24 DIAGNOSIS — Z1231 Encounter for screening mammogram for malignant neoplasm of breast: Secondary | ICD-10-CM

## 2017-01-30 MED FILL — PANTOPRAZOLE SOD DR 40 MG T: 40 | 90 days supply | Qty: 90 | Fill #3

## 2017-01-30 MED FILL — LEVOTHYROXINE 112 MCG TAB: 112 | 90 days supply | Qty: 90 | Fill #3

## 2017-02-01 DIAGNOSIS — H5211 Myopia, right eye: Secondary | ICD-10-CM | POA: Diagnosis not present

## 2017-02-01 DIAGNOSIS — H524 Presbyopia: Secondary | ICD-10-CM | POA: Diagnosis not present

## 2017-02-14 DIAGNOSIS — Z6834 Body mass index (BMI) 34.0-34.9, adult: Secondary | ICD-10-CM | POA: Diagnosis not present

## 2017-02-14 DIAGNOSIS — M4316 Spondylolisthesis, lumbar region: Secondary | ICD-10-CM | POA: Diagnosis not present

## 2017-02-14 DIAGNOSIS — M5126 Other intervertebral disc displacement, lumbar region: Secondary | ICD-10-CM | POA: Diagnosis not present

## 2017-02-14 DIAGNOSIS — M5416 Radiculopathy, lumbar region: Secondary | ICD-10-CM | POA: Diagnosis not present

## 2017-02-15 ENCOUNTER — Other Ambulatory Visit (HOSPITAL_COMMUNITY): Payer: Self-pay | Admitting: Neurosurgery

## 2017-02-15 DIAGNOSIS — M5416 Radiculopathy, lumbar region: Secondary | ICD-10-CM

## 2017-02-17 ENCOUNTER — Ambulatory Visit (HOSPITAL_COMMUNITY)
Admission: RE | Admit: 2017-02-17 | Discharge: 2017-02-17 | Disposition: A | Discharge status: Home / Self Care | Payer: 59 | Source: Ambulatory Visit | Attending: Neurosurgery | Admitting: Neurosurgery

## 2017-02-17 DIAGNOSIS — M5137 Other intervertebral disc degeneration, lumbosacral region: Secondary | ICD-10-CM | POA: Insufficient documentation

## 2017-02-17 DIAGNOSIS — M5416 Radiculopathy, lumbar region: Secondary | ICD-10-CM

## 2017-02-17 DIAGNOSIS — M5124 Other intervertebral disc displacement, thoracic region: Secondary | ICD-10-CM | POA: Insufficient documentation

## 2017-02-17 DIAGNOSIS — M5127 Other intervertebral disc displacement, lumbosacral region: Secondary | ICD-10-CM | POA: Diagnosis not present

## 2017-02-17 DIAGNOSIS — M5116 Intervertebral disc disorders with radiculopathy, lumbar region: Secondary | ICD-10-CM | POA: Insufficient documentation

## 2017-02-17 DIAGNOSIS — M48061 Spinal stenosis, lumbar region without neurogenic claudication: Secondary | ICD-10-CM | POA: Diagnosis not present

## 2017-02-17 DIAGNOSIS — M4686 Other specified inflammatory spondylopathies, lumbar region: Secondary | ICD-10-CM | POA: Insufficient documentation

## 2017-02-17 MED ORDER — GADOBENATE DIMEGLUMINE 529 MG/ML IV SOLN
17.0000 mL | Freq: Once | INTRAVENOUS | Status: AC
  Administered 2017-02-17: 17 mL via INTRAVENOUS

## 2017-02-20 ENCOUNTER — Ambulatory Visit (AMBULATORY_SURGERY_CENTER): Payer: 59

## 2017-02-20 VITALS — Ht 64.0 in | Wt 202.0 lb

## 2017-02-20 DIAGNOSIS — Z800 Family history of malignant neoplasm of digestive organs: Secondary | ICD-10-CM

## 2017-02-20 NOTE — Progress Notes (Signed)
Per pt, no allergies to soy or egg products.Pt not taking any weight loss meds or using  O2 at home.  Pt refused Emmi video.  During the PV and after reviewing the pt's medical history, the pt states she has had episodes with her heart where she has palpitations, gets very weak, very sweaty almost blacks out.  The pt does know why, there is no reason, per pt! The pt had tears in her eyes while talking and was emotional.  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 She does not remember the last time she had this cardiac issue or saw a cardiologist. The last ECHO was 2013. The pt is going to get a referral from Dr Jenny Reichmann to see a cardiologist to get cardiac clearance prior to the colon. The colon on 03/02/17 will be cancelled and will be rescheduled after we receive cardiac clearance. Pt understood.

## 2017-02-21 ENCOUNTER — Encounter: Payer: Self-pay | Admitting: Internal Medicine

## 2017-02-21 DIAGNOSIS — Z01810 Encounter for preprocedural cardiovascular examination: Secondary | ICD-10-CM

## 2017-02-22 HISTORY — PX: OTHER SURGICAL HISTORY: SHX169

## 2017-02-27 DIAGNOSIS — M4316 Spondylolisthesis, lumbar region: Secondary | ICD-10-CM | POA: Diagnosis not present

## 2017-02-27 DIAGNOSIS — M47816 Spondylosis without myelopathy or radiculopathy, lumbar region: Secondary | ICD-10-CM | POA: Diagnosis not present

## 2017-02-27 DIAGNOSIS — M549 Dorsalgia, unspecified: Secondary | ICD-10-CM | POA: Diagnosis not present

## 2017-02-27 DIAGNOSIS — M5416 Radiculopathy, lumbar region: Secondary | ICD-10-CM | POA: Diagnosis not present

## 2017-03-01 ENCOUNTER — Ambulatory Visit: Payer: 59 | Admitting: Cardiology

## 2017-03-01 ENCOUNTER — Encounter: Payer: Self-pay | Admitting: Cardiology

## 2017-03-01 VITALS — BP 110/66 | HR 89 | Ht 64.0 in | Wt 200.0 lb

## 2017-03-01 DIAGNOSIS — M5440 Lumbago with sciatica, unspecified side: Secondary | ICD-10-CM | POA: Diagnosis not present

## 2017-03-01 DIAGNOSIS — R002 Palpitations: Secondary | ICD-10-CM | POA: Diagnosis not present

## 2017-03-01 DIAGNOSIS — G8929 Other chronic pain: Secondary | ICD-10-CM | POA: Diagnosis not present

## 2017-03-01 DIAGNOSIS — M549 Dorsalgia, unspecified: Secondary | ICD-10-CM | POA: Insufficient documentation

## 2017-03-01 DIAGNOSIS — Z01810 Encounter for preprocedural cardiovascular examination: Secondary | ICD-10-CM | POA: Diagnosis not present

## 2017-03-01 DIAGNOSIS — R079 Chest pain, unspecified: Secondary | ICD-10-CM

## 2017-03-01 MED ORDER — METOPROLOL TARTRATE 50 MG PO TABS: 50.0000 mg | ORAL_TABLET | Freq: Once | ORAL | 0 refills | Status: DC

## 2017-03-01 MED FILL — METOPROLOL TARTRATE 50 MG T: 50 | 1 days supply | Qty: 1 | Fill #0
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
## 2017-03-01 NOTE — Patient Instructions (Addendum)
Schedule [place] Your physician has recommended that you wear an event monitor 30 DAYS. Event monitors are medical devices that record the heart's electrical activity. Doctors most often [place] these monitors to diagnose arrhythmias. Arrhythmias are problems with the speed or rhythm of the heartbeat. The monitor is a small, portable device. You can wear one while you do your normal daily activities. This is usually used to diagnose what is causing palpitations/syncope (passing out).     SCHEDULE AT [place] DONE AT LEAST WEEK PRIOR TO SCHEDULE TEST , DO NOT TO FORGET TO PICK METOPROLOL FROM THE [facility] Your physician has requested that you have cardiac CT. Cardiac computed tomography (CT) is a painless test that uses an x-ray machine to take clear, detailed pictures of your heart. For further information please visit HugeFiesta.tn. Please follow instruction sheet as given.  Your physician recommends that you schedule a follow-up appointment in [place].  Please arrive at the [facility] main entrance of [facility] at   (30-45 minutes prior to test start time)  [facility] [address] [place], [place] [address] [phone]  Proceed to the [facility] Radiology Department (First Floor).  Please follow these instructions carefully (unless otherwise directed):  Hold all erectile dysfunction medications at least 48 hours prior to test.  On the Night Before the Test: . Drink plenty of water. . Do not consume any caffeinated/decaffeinated beverages or chocolate 12 hours prior to your test. . Do not take any antihistamines 12 hours prior to your test.   On the Day of the Test: . Drink plenty of water. Do not drink any water within one hour of the test. . Do not eat any food 4 hours prior to the test. . You may take your regular medications prior to the test. . IF NOT ON A BETA BLOCKER - Take  50 mg of lopressor (metoprolol) one hour before the test. . HOLD Furosemide morning of the test.  After the Test: . Drink plenty of water. . After receiving IV contrast, you may experience a mild flushed feeling. This is normal. . On occasion, you may experience a mild rash up to 24 hours after the test. This is not dangerous. If this occurs, you can take Benadryl 25 mg and increase your fluid intake. . If you experience trouble breathing, this can be serious. If it is severe call 911 IMMEDIATELY. If it is mild, please call our office.

## 2017-03-02 ENCOUNTER — Encounter: Payer: 59 | Admitting: Gastroenterology

## 2017-03-03 ENCOUNTER — Encounter: Payer: Self-pay | Admitting: Cardiology

## 2017-03-03 NOTE — Assessment & Plan Note (Signed)
Since she is having "chest pain" symptoms, we will evaluate with coronary CTA.  If this is negative, she should be fine proceeding with her colonoscopy or endoscopy procedure.   The question that has to be asked is what do we do if we find something on her coronary CTA. She had a negative Myoview for similar symptoms which would suggest that she should be fine proceeding with colonoscopy.  My recommendations would be to proceed with colonoscopy unless there is a high risk finding on a CT scan.   If he were to have to perform PCI, this would put off any endoscopy for at least 3-6 months.

## 2017-03-03 NOTE — Assessment & Plan Note (Signed)
She has atypical chest pain but also has some typical features.  She had a Myoview stress test last year for similar symptoms that was nonischemic.  Unfortunately, she only walked 3-1/2 minutes and therefore there is some limitation to the study. In order to clarify the presence or absence of any microvascular CAD, the options are either catheterization or coronary CTA.  At this point I think the most appropriate course of action is coronary CTA as I am not convinced of her symptoms being cardiac, but need to be sure. Coronary CTA will also allow [place] to proceed with FFR if necessary and this would direct [place] appropriately if cardiac catheterization is required.  She has relatively well-controlled lipids, but does not take aspirin or statin.  She only takes as needed nitroglycerin but has not done that in a while.  I will refill nitroglycerin for her.

## 2017-03-03 NOTE — Assessment & Plan Note (Signed)
She has episodes maybe once or twice a week lasting 15 minutes or so.  This could be an arrhythmia or could simply just be PACs or PVCs in a bigeminy -trigeminy pattern.  Plan: Cardiac event monitor for 30 days.

## 2017-03-03 NOTE — Assessment & Plan Note (Signed)
Pretty much limits her activity levels and therefore she was not able to do well with treadmill stress test.

## 2017-03-03 NOTE — Assessment & Plan Note (Signed)
She does have mild edema, had normal EF on echo back in 2013 with normal filling parameters.  Myoview just last year showed normal EF as well.  He could have some diastolic dysfunction, however that she does not have any PND orthopnea.  If I would suspect it would be venous stasis related.  Plan: Would recommend support stockings along with Lasix as she is taking it.

## 2017-03-06 DIAGNOSIS — M47816 Spondylosis without myelopathy or radiculopathy, lumbar region: Secondary | ICD-10-CM | POA: Diagnosis not present

## 2017-03-12 DIAGNOSIS — R002 Palpitations: Secondary | ICD-10-CM | POA: Diagnosis not present

## 2017-03-12 DIAGNOSIS — Z01810 Encounter for preprocedural cardiovascular examination: Secondary | ICD-10-CM | POA: Diagnosis not present

## 2017-03-12 DIAGNOSIS — R079 Chest pain, unspecified: Secondary | ICD-10-CM | POA: Diagnosis not present

## 2017-03-13 ENCOUNTER — Ambulatory Visit (INDEPENDENT_AMBULATORY_CARE_PROVIDER_SITE_OTHER): Payer: 59

## 2017-03-13 DIAGNOSIS — Z01810 Encounter for preprocedural cardiovascular examination: Secondary | ICD-10-CM | POA: Diagnosis not present

## 2017-03-13 DIAGNOSIS — R002 Palpitations: Secondary | ICD-10-CM | POA: Diagnosis not present

## 2017-03-13 LAB — BASIC METABOLIC PANEL
BUN/Creatinine Ratio: 21 (ref 9–23)
BUN: 15 mg/dL (ref 6–24)
CO2: 25 mmol/L (ref 20–29)
Calcium: 9.7 mg/dL (ref 8.7–10.2)
Chloride: 101 mmol/L (ref 96–106)
Creatinine, Ser: 0.7 mg/dL (ref 0.57–1.00)
GFR calc Af Amer: 110 mL/min/{1.73_m2} (ref >59)
GFR calc non Af Amer: 95 mL/min/{1.73_m2} (ref >59)
Glucose: 87 mg/dL (ref 65–99)
Potassium: 4.2 mmol/L (ref 3.5–5.2)
Sodium: 142 mmol/L (ref 134–144)

## 2017-03-14 DIAGNOSIS — R002 Palpitations: Secondary | ICD-10-CM | POA: Diagnosis not present

## 2017-03-16 ENCOUNTER — Emergency Department (HOSPITAL_COMMUNITY): Payer: 59

## 2017-03-16 ENCOUNTER — Encounter (HOSPITAL_BASED_OUTPATIENT_CLINIC_OR_DEPARTMENT_OTHER): Payer: Self-pay

## 2017-03-16 ENCOUNTER — Other Ambulatory Visit: Payer: Self-pay

## 2017-03-16 ENCOUNTER — Ambulatory Visit: Payer: Self-pay

## 2017-03-16 ENCOUNTER — Emergency Department (HOSPITAL_BASED_OUTPATIENT_CLINIC_OR_DEPARTMENT_OTHER)
Admission: EM | Admit: 2017-03-16 | Discharge: 2017-03-16 | Disposition: A | Discharge status: Home / Self Care | Payer: 59 | Attending: Emergency Medicine | Admitting: Emergency Medicine

## 2017-03-16 DIAGNOSIS — N390 Urinary tract infection, site not specified: Secondary | ICD-10-CM | POA: Insufficient documentation

## 2017-03-16 DIAGNOSIS — Z79899 Other long term (current) drug therapy: Secondary | ICD-10-CM | POA: Diagnosis not present

## 2017-03-16 DIAGNOSIS — Z91040 Latex allergy status: Secondary | ICD-10-CM | POA: Insufficient documentation

## 2017-03-16 DIAGNOSIS — M5441 Lumbago with sciatica, right side: Secondary | ICD-10-CM | POA: Diagnosis not present

## 2017-03-16 DIAGNOSIS — Z87891 Personal history of nicotine dependence: Secondary | ICD-10-CM | POA: Insufficient documentation

## 2017-03-16 DIAGNOSIS — J45909 Unspecified asthma, uncomplicated: Secondary | ICD-10-CM | POA: Insufficient documentation

## 2017-03-16 DIAGNOSIS — M549 Dorsalgia, unspecified: Secondary | ICD-10-CM | POA: Diagnosis not present

## 2017-03-16 DIAGNOSIS — M545 Low back pain: Secondary | ICD-10-CM

## 2017-03-16 DIAGNOSIS — E039 Hypothyroidism, unspecified: Secondary | ICD-10-CM | POA: Diagnosis not present

## 2017-03-16 DIAGNOSIS — G8929 Other chronic pain: Secondary | ICD-10-CM | POA: Insufficient documentation

## 2017-03-16 LAB — BASIC METABOLIC PANEL
Anion gap: 8 (ref 5–15)
BUN: 13 mg/dL (ref 6–20)
CO2: 26 mmol/L (ref 22–32)
Calcium: 9.2 mg/dL (ref 8.9–10.3)
Chloride: 104 mmol/L (ref 101–111)
Creatinine, Ser: 0.8 mg/dL (ref 0.44–1.00)
GFR calc Af Amer: >60 mL/min (ref >60)
GFR calc non Af Amer: >60 mL/min (ref >60)
Glucose, Bld: 108 mg/dL — ABNORMAL HIGH (ref 65–99)
Potassium: 3.6 mmol/L (ref 3.5–5.1)
Sodium: 138 mmol/L (ref 135–145)

## 2017-03-16 LAB — CBC WITH DIFFERENTIAL/PLATELET
Basophils Absolute: 0 10*3/uL (ref 0.0–0.1)
Basophils Relative: 0 %
Eosinophils Absolute: 0 10*3/uL (ref 0.0–0.7)
Eosinophils Relative: 0 %
HCT: 41.6 % (ref 36.0–46.0)
Hemoglobin: 14 g/dL (ref 12.0–15.0)
Lymphocytes Relative: 11 %
Lymphs Abs: 0.9 10*3/uL (ref 0.7–4.0)
MCH: 31.9 pg (ref 26.0–34.0)
MCHC: 33.7 g/dL (ref 30.0–36.0)
MCV: 94.8 fL (ref 78.0–100.0)
Monocytes Absolute: 0.1 10*3/uL (ref 0.1–1.0)
Monocytes Relative: 2 %
Neutro Abs: 7.9 10*3/uL — ABNORMAL HIGH (ref 1.7–7.7)
Neutrophils Relative %: 87 %
Platelets: 243 10*3/uL (ref 150–400)
RBC: 4.39 MIL/uL (ref 3.87–5.11)
RDW: 13 % (ref 11.5–15.5)
WBC: 9 10*3/uL (ref 4.0–10.5)

## 2017-03-16 LAB — URINALYSIS, ROUTINE W REFLEX MICROSCOPIC
Bilirubin Urine: NEGATIVE
Glucose, UA: NEGATIVE mg/dL
Ketones, ur: NEGATIVE mg/dL
Nitrite: NEGATIVE
Protein, ur: NEGATIVE mg/dL
Specific Gravity, Urine: 1.009 (ref 1.005–1.030)
pH: 7 (ref 5.0–8.0)

## 2017-03-16 MED ORDER — LORAZEPAM 2 MG/ML IJ SOLN
1.0000 mg | Freq: Once | INTRAMUSCULAR | Status: AC
  Administered 2017-03-16: 1 mg via INTRAVENOUS
  Filled 2017-03-16: qty 1

## 2017-03-16 MED ORDER — DEXAMETHASONE SODIUM PHOSPHATE 10 MG/ML IJ SOLN
10.0000 mg | Freq: Once | INTRAMUSCULAR | Status: AC
  Administered 2017-03-16: 10 mg via INTRAMUSCULAR
  Filled 2017-03-16: qty 1

## 2017-03-16 MED ORDER — SODIUM CHLORIDE 0.9 % IV SOLN
INTRAVENOUS | Status: DC
  Administered 2017-03-16: 20 mL/h via INTRAVENOUS

## 2017-03-16 MED ORDER — CEPHALEXIN 500 MG PO CAPS: 500.0000 mg | ORAL_CAPSULE | Freq: Four times a day (QID) | ORAL | 0 refills | Status: DC

## 2017-03-16 MED ORDER — MORPHINE SULFATE (PF) 4 MG/ML IV SOLN
4.0000 mg | Freq: Once | INTRAVENOUS | Status: AC
Start: 2017-03-16 — End: 2017-03-16
  Administered 2017-03-16: 4 mg via INTRAVENOUS
  Filled 2017-03-16: qty 1

## 2017-03-16 MED ORDER — GADOBENATE DIMEGLUMINE 529 MG/ML IV SOLN
17.0000 mL | Freq: Once | INTRAVENOUS | Status: AC
  Administered 2017-03-16: 17 mL via INTRAVENOUS

## 2017-03-16 NOTE — ED Notes (Signed)
ED Provider at bedside. 

## 2017-03-16 NOTE — ED Notes (Signed)
Pt. Refused foley catheter. MD notified.
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
## 2017-03-16 NOTE — ED Notes (Signed)
Patient transported to MRI 

## 2017-03-16 NOTE — ED Triage Notes (Addendum)
Pt c/o pain to lower back with hx of same-now having pain down right leg x 3 days-NAD-presents to triage in w/c-states she was advised by Neuro PA to go to UC-spoke with an UC and was advised to come to ED
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
## 2017-03-16 NOTE — ED Notes (Signed)
Attempted IV stick x 2 unsuccessfully. Another RN will try

## 2017-03-16 NOTE — Telephone Encounter (Signed)
Pt. called with c/o constant low back pain radiating into the right buttocks, groin, and right lateral leg.  Stated 2 days ago, she tried to get off the toilet, and when she twisted  to turn to sink, "felt something pop".  Also,  reported she had a steroid injection on Monday, this week, for her back pain, prior to the new injury.  Stated she is not able to get comfortable.  Reported pain at 5/10 with standing, and 7-8/10 with sitting.  Also stated, she feels a lot of pressure, and urge to urinate more frequently since this recent injury.  Reported she is taking Hydrocodone / Acetaminophen, and a muscle relaxant, but the pain is constant.  Advised that the protocol recommends to  go to UC.  Pt. voiced concern about being able to have xray at [facility].  Suggested to call [facility] Neurosurgery for further recommendation, or to go to the ER. Pt. verb. Understanding.            Reason for Disposition . [1] SEVERE back pain (e.g., excruciating, unable to do any normal activities) AND [2] not improved 2 hours after pain medicine  Answer Assessment - Initial Assessment Questions 1. ONSET: "When did the pain begin?"      2 days ago when turned to get off the toilet; felt something pop  2. LOCATION: "Where does it hurt?" (upper, mid or lower back)     Low back, buttocks, groin, and right lateral leg   3. SEVERITY: "How bad is the pain?"  (e.g., Scale 1-10; mild, moderate, or severe)   - MILD (1-3): doesn't interfere with normal activities    - MODERATE (4-7): interferes with normal activities or awakens from sleep    - SEVERE (8-10): excruciating pain, unable to do any normal activities      5/10 with standing and 8/10 with sitting; "sizzle and sharp pain" 4. PATTERN: "Is the pain constant?" (e.g., yes, no; constant, intermittent)      constant 5. RADIATION: "Does the pain shoot into your legs or elsewhere?"     Down into right leg 6. CAUSE:  "What do you think is causing the back pain?"      Has had  back pain 7. BACK OVERUSE:  "Any recent lifting of heavy objects, strenuous work or exercise?"     no 8. MEDICATIONS: "What have you taken so far for the pain?" (e.g., nothing, acetaminophen, NSAIDS)     Hydrocodone 5/325 and muscle relaxant-tizanitine 9. NEUROLOGIC SYMPTOMS: "Do you have any weakness, numbness, or problems with bowel/bladder control?"     Pressure to urinate frequently 10. OTHER SYMPTOMS: "Do you have any other symptoms?" (e.g., fever, abdominal pain, burning with urination, blood in urine)       no 11. PREGNANCY: "Is there any chance you are pregnant?" (e.g., yes, no; LMP)       n/a  Protocols used: BACK PAIN-A-AH

## 2017-03-16 NOTE — ED Notes (Signed)
IV established using ultrasound.  Needle visualized in the vein free from the wall.  Blood return noted and flushed without difficulty.  Patient tolerated procedure well.

## 2017-03-19 ENCOUNTER — Encounter (HOSPITAL_COMMUNITY): Payer: Self-pay

## 2017-03-19 ENCOUNTER — Ambulatory Visit (HOSPITAL_COMMUNITY): Payer: 59

## 2017-03-19 ENCOUNTER — Ambulatory Visit (HOSPITAL_COMMUNITY)
Admission: RE | Admit: 2017-03-19 | Discharge: 2017-03-19 | Disposition: A | Discharge status: Home / Self Care | Payer: 59 | Source: Ambulatory Visit | Attending: Cardiology | Admitting: Cardiology

## 2017-03-19 DIAGNOSIS — J9811 Atelectasis: Secondary | ICD-10-CM | POA: Insufficient documentation

## 2017-03-19 DIAGNOSIS — Z01810 Encounter for preprocedural cardiovascular examination: Secondary | ICD-10-CM | POA: Diagnosis not present

## 2017-03-19 DIAGNOSIS — K760 Fatty (change of) liver, not elsewhere classified: Secondary | ICD-10-CM | POA: Diagnosis not present

## 2017-03-19 DIAGNOSIS — R079 Chest pain, unspecified: Secondary | ICD-10-CM | POA: Diagnosis not present

## 2017-03-19 MED ORDER — IOPAMIDOL (ISOVUE-370) INJECTION 76%
INTRAVENOUS | Status: AC
  Administered 2017-03-19: 80 mL
  Filled 2017-03-19: qty 100

## 2017-03-19 MED ORDER — NITROGLYCERIN 0.4 MG SL SUBL
SUBLINGUAL_TABLET | SUBLINGUAL | Status: AC
  Filled 2017-03-19: qty 2

## 2017-03-19 MED ORDER — NITROGLYCERIN 0.4 MG SL SUBL
0.8000 mg | SUBLINGUAL_TABLET | Freq: Once | SUBLINGUAL | Status: AC
  Administered 2017-03-19: 0.8 mg via SUBLINGUAL

## 2017-03-19 MED ORDER — METOPROLOL TARTRATE 5 MG/5ML IV SOLN
5.0000 mg | INTRAVENOUS | Status: DC | PRN
  Administered 2017-03-19: 5 mg via INTRAVENOUS

## 2017-03-19 MED ORDER — METOPROLOL TARTRATE 5 MG/5ML IV SOLN
INTRAVENOUS | Status: AC
  Filled 2017-03-19: qty 10
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
## 2017-03-23 ENCOUNTER — Ambulatory Visit: Payer: 59 | Admitting: Student

## 2017-03-23 ENCOUNTER — Encounter: Payer: Self-pay | Admitting: Student

## 2017-03-23 VITALS — BP 127/78 | HR 85 | Ht 61.0 in | Wt 196.0 lb

## 2017-03-23 DIAGNOSIS — R079 Chest pain, unspecified: Secondary | ICD-10-CM | POA: Diagnosis not present

## 2017-03-23 DIAGNOSIS — E785 Hyperlipidemia, unspecified: Secondary | ICD-10-CM | POA: Insufficient documentation

## 2017-03-23 DIAGNOSIS — R002 Palpitations: Secondary | ICD-10-CM | POA: Diagnosis not present

## 2017-03-23 DIAGNOSIS — Z01810 Encounter for preprocedural cardiovascular examination: Secondary | ICD-10-CM | POA: Diagnosis not present

## 2017-03-23 NOTE — Patient Instructions (Signed)
You are cleared for your colonoscopy and spinal injections!!  Bernerd Pho, PA-C recommends that you schedule a follow-up appointment in 6 months with Dr Ellyn Hack. You will receive a reminder letter in the mail two months in advance. If you don't receive a letter, please call our office to schedule the follow-up appointment.  If you need a refill on your cardiac medications before your next appointment, please call your pharmacy.

## 2017-03-24 ENCOUNTER — Ambulatory Visit (HOSPITAL_COMMUNITY)
Admission: EM | Admit: 2017-03-24 | Discharge: 2017-03-24 | Disposition: A | Discharge status: Home / Self Care | Payer: 59 | Attending: Family Medicine | Admitting: Family Medicine

## 2017-03-24 ENCOUNTER — Encounter (HOSPITAL_COMMUNITY): Payer: Self-pay | Admitting: Emergency Medicine

## 2017-03-24 DIAGNOSIS — G8929 Other chronic pain: Secondary | ICD-10-CM

## 2017-03-24 DIAGNOSIS — M5440 Lumbago with sciatica, unspecified side: Secondary | ICD-10-CM | POA: Diagnosis not present

## 2017-03-24 MED ORDER — HYDROCODONE-ACETAMINOPHEN 5-325 MG PO TABS: 1.0000 | ORAL_TABLET | Freq: Three times a day (TID) | ORAL | 0 refills | Status: DC | PRN

## 2017-03-24 NOTE — ED Triage Notes (Signed)
PT C/O: persistent lower back pain .... Seeing her neurosurgeon for this but office was closed and sts she ran out of her Vicodins.... Also sts she was seen at [facility] ED for similar sx on 03/16/17  ONSET: 2 weeks  SX ALSO INCLUDE: pain increases w/activity  DENIES:   TAKING MEDS: Vicodins  A&O x4... NAD... Ambulatory
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
## 2017-03-27 DIAGNOSIS — N760 Acute vaginitis: Secondary | ICD-10-CM | POA: Diagnosis not present

## 2017-03-27 DIAGNOSIS — M549 Dorsalgia, unspecified: Secondary | ICD-10-CM | POA: Diagnosis not present

## 2017-03-27 DIAGNOSIS — B9620 Unspecified Escherichia coli [E. coli] as the cause of diseases classified elsewhere: Secondary | ICD-10-CM | POA: Diagnosis not present

## 2017-03-27 DIAGNOSIS — N390 Urinary tract infection, site not specified: Secondary | ICD-10-CM | POA: Diagnosis not present

## 2017-03-27 DIAGNOSIS — N281 Cyst of kidney, acquired: Secondary | ICD-10-CM | POA: Diagnosis not present

## 2017-03-27 MED FILL — FLUCONAZOLE 200 MG TAB: 200 | 1 days supply | Qty: 1 | Fill #0

## 2017-03-29 ENCOUNTER — Telehealth: Payer: Self-pay

## 2017-03-29 NOTE — Telephone Encounter (Signed)
Called pt to inform her she was cleared by Bernerd Pho PA and was OK per Dr Loletha Carrow to proceed with her colonosocpy. Pt states she is still wearing a cardiac monitor and is scheduled to have a surgical procedure on her back. The pt wants to wait until she gets all her tests completed and will call back to reschedule the colon later. All her questions were answered.Gwyndolyn Saxon

## 2017-03-30 DIAGNOSIS — M47816 Spondylosis without myelopathy or radiculopathy, lumbar region: Secondary | ICD-10-CM | POA: Diagnosis not present

## 2017-03-30 MED FILL — NITROFURANTOIN MONO-MCR 100: 100 | 20 days supply | Qty: 30 | Fill #0

## 2017-04-10 ENCOUNTER — Ambulatory Visit: Payer: 59 | Admitting: Cardiology

## 2017-04-18 ENCOUNTER — Other Ambulatory Visit (INDEPENDENT_AMBULATORY_CARE_PROVIDER_SITE_OTHER): Payer: 59

## 2017-04-18 ENCOUNTER — Telehealth: Payer: Self-pay

## 2017-04-18 DIAGNOSIS — Z0000 Encounter for general adult medical examination without abnormal findings: Secondary | ICD-10-CM | POA: Diagnosis not present

## 2017-04-18 LAB — HEPATIC FUNCTION PANEL
ALT: 18 U/L (ref 0–35)
AST: 14 U/L (ref 0–37)
Albumin: 3.8 g/dL (ref 3.5–5.2)
Alkaline Phosphatase: 74 U/L (ref 39–117)
Bilirubin, Direct: 0.1 mg/dL (ref 0.0–0.3)
Total Bilirubin: 0.6 mg/dL (ref 0.2–1.2)
Total Protein: 6.7 g/dL (ref 6.0–8.3)

## 2017-04-18 LAB — LIPID PANEL
Cholesterol: 150 mg/dL (ref 0–200)
HDL: 51.9 mg/dL (ref >39.00)
LDL Cholesterol: 87 mg/dL (ref 0–99)
NonHDL: 98.38
Total CHOL/HDL Ratio: 3
Triglycerides: 55 mg/dL (ref 0.0–149.0)
VLDL: 11 mg/dL (ref 0.0–40.0)

## 2017-04-18 LAB — TSH: TSH: 1.67 u[IU]/mL (ref 0.35–4.50)

## 2017-04-18 LAB — CBC WITH DIFFERENTIAL/PLATELET
Basophils Absolute: 0.1 10*3/uL (ref 0.0–0.1)
Basophils Relative: 1.5 % (ref 0.0–3.0)
Eosinophils Absolute: 0.1 10*3/uL (ref 0.0–0.7)
Eosinophils Relative: 2.2 % (ref 0.0–5.0)
HCT: 44 % (ref 36.0–46.0)
Hemoglobin: 14.6 g/dL (ref 12.0–15.0)
Lymphocytes Relative: 27 % (ref 12.0–46.0)
Lymphs Abs: 1.4 10*3/uL (ref 0.7–4.0)
MCHC: 33.1 g/dL (ref 30.0–36.0)
MCV: 96.1 fl (ref 78.0–100.0)
Monocytes Absolute: 0.4 10*3/uL (ref 0.1–1.0)
Monocytes Relative: 7.7 % (ref 3.0–12.0)
Neutro Abs: 3.1 10*3/uL (ref 1.4–7.7)
Neutrophils Relative %: 61.6 % (ref 43.0–77.0)
Platelets: 223 10*3/uL (ref 150.0–400.0)
RBC: 4.58 Mil/uL (ref 3.87–5.11)
RDW: 13.8 % (ref 11.5–15.5)
WBC: 5.1 10*3/uL (ref 4.0–10.5)

## 2017-04-18 LAB — BASIC METABOLIC PANEL
BUN: 12 mg/dL (ref 6–23)
CO2: 27 mEq/L (ref 19–32)
Calcium: 9.1 mg/dL (ref 8.4–10.5)
Chloride: 104 mEq/L (ref 96–112)
Creatinine, Ser: 0.86 mg/dL (ref 0.40–1.20)
GFR: 71.66 mL/min (ref >60.00)
Glucose, Bld: 94 mg/dL (ref 70–99)
Potassium: 3.8 mEq/L (ref 3.5–5.1)
Sodium: 139 mEq/L (ref 135–145)

## 2017-04-18 LAB — URINALYSIS, ROUTINE W REFLEX MICROSCOPIC
Bilirubin Urine: NEGATIVE
Hgb urine dipstick: NEGATIVE
Ketones, ur: NEGATIVE
Nitrite: NEGATIVE
RBC / HPF: NONE SEEN (ref 0–?)
Specific Gravity, Urine: 1.01 (ref 1.000–1.030)
Total Protein, Urine: NEGATIVE
Urine Glucose: NEGATIVE
Urobilinogen, UA: 0.2 (ref 0.0–1.0)
pH: 7.5 (ref 5.0–8.0)

## 2017-04-18 NOTE — Telephone Encounter (Signed)
Lab needed orders

## 2017-04-20 ENCOUNTER — Ambulatory Visit (INDEPENDENT_AMBULATORY_CARE_PROVIDER_SITE_OTHER)
Admission: RE | Admit: 2017-04-20 | Discharge: 2017-04-20 | Disposition: A | Discharge status: Home / Self Care | Payer: 59 | Source: Ambulatory Visit | Attending: Internal Medicine | Admitting: Internal Medicine

## 2017-04-20 ENCOUNTER — Encounter: Payer: Self-pay | Admitting: Internal Medicine

## 2017-04-20 ENCOUNTER — Ambulatory Visit (INDEPENDENT_AMBULATORY_CARE_PROVIDER_SITE_OTHER): Payer: 59 | Admitting: Internal Medicine

## 2017-04-20 ENCOUNTER — Telehealth: Payer: Self-pay | Admitting: Cardiology

## 2017-04-20 VITALS — BP 118/78 | HR 82 | Temp 98.0°F | Ht 63.0 in | Wt 193.0 lb

## 2017-04-20 DIAGNOSIS — Z0000 Encounter for general adult medical examination without abnormal findings: Secondary | ICD-10-CM | POA: Diagnosis not present

## 2017-04-20 DIAGNOSIS — M25552 Pain in left hip: Secondary | ICD-10-CM | POA: Diagnosis not present

## 2017-04-20 DIAGNOSIS — R7302 Impaired glucose tolerance (oral): Secondary | ICD-10-CM | POA: Diagnosis not present

## 2017-04-20 DIAGNOSIS — Z114 Encounter for screening for human immunodeficiency virus [HIV]: Secondary | ICD-10-CM

## 2017-04-20 DIAGNOSIS — E785 Hyperlipidemia, unspecified: Secondary | ICD-10-CM

## 2017-04-20 DIAGNOSIS — R1031 Right lower quadrant pain: Secondary | ICD-10-CM

## 2017-04-20 DIAGNOSIS — M25551 Pain in right hip: Secondary | ICD-10-CM | POA: Diagnosis not present

## 2017-04-20 MED ORDER — ROSUVASTATIN CALCIUM 10 MG PO TABS: 10.0000 mg | ORAL_TABLET | Freq: Every day | ORAL | 3 refills | Status: DC

## 2017-04-20 MED FILL — ROSUVASTATIN CALCIUM 10 MG: 10 | 90 days supply | Qty: 90 | Fill #0
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
## 2017-04-20 NOTE — Patient Instructions (Addendum)
Please take all new medication as prescribed - the crestor low dose  Please continue all other medications as before, and refills have been done if requested.  Please have the pharmacy call with any other refills you may need.  Please continue your efforts at being more active, low cholesterol diet, and weight control.  You are otherwise up to date with prevention measures today.  Please keep your appointments with your specialists as you may have planned  Please go to the XRAY Department in the Basement (go straight as you get off the elevator) for the x-ray testing  Please return in 3 months for LAB only to recheck the cholesterol  You will be contacted by phone if any changes need to be made immediately.  Otherwise, you will receive a letter about your results with an explanation, but please check with MyChart first.  Please remember to sign up for MyChart if you have not done so, as this will be important to you in the future with finding out test results, communicating by private email, and scheduling acute appointments online when needed.  You will be contacted regarding the referral for: colonoscopy  Please return in 1 year for your yearly visit, or sooner if needed, with Lab testing done 3-5 days before

## 2017-04-20 NOTE — Telephone Encounter (Signed)
Lm2cb 

## 2017-04-20 NOTE — Telephone Encounter (Signed)
Please call,says she needs to talk to you.

## 2017-04-21 ENCOUNTER — Encounter: Payer: Self-pay | Admitting: Internal Medicine

## 2017-04-21 DIAGNOSIS — R1031 Right lower quadrant pain: Secondary | ICD-10-CM | POA: Insufficient documentation

## 2017-04-21 NOTE — Assessment & Plan Note (Signed)
Etiology unclear, mild to mod but persistent, for film today right hip and refer sports medicine in this office

## 2017-04-21 NOTE — Assessment & Plan Note (Signed)
Lab Results  Component Value Date   LDLCALC 87 04/18/2017  stable overall by history and exam, recent data reviewed with pt, and pt to continue medical treatment as before,  to f/u any worsening symptoms or concerns

## 2017-04-21 NOTE — Assessment & Plan Note (Signed)

## 2017-04-21 NOTE — Assessment & Plan Note (Signed)
stable overall by history and exam, recent data reviewed with pt, and pt to continue medical treatment as before,  to f/u any worsening symptoms or concerns Lab Results  Component Value Date   HGBA1C 5.5 04/05/2015

## 2017-04-23 DIAGNOSIS — Z6834 Body mass index (BMI) 34.0-34.9, adult: Secondary | ICD-10-CM | POA: Diagnosis not present

## 2017-04-23 DIAGNOSIS — R030 Elevated blood-pressure reading, without diagnosis of hypertension: Secondary | ICD-10-CM | POA: Diagnosis not present

## 2017-04-23 DIAGNOSIS — M47816 Spondylosis without myelopathy or radiculopathy, lumbar region: Secondary | ICD-10-CM | POA: Diagnosis not present

## 2017-04-23 MED FILL — HYDROCODON-APAP 5-325: 5-325 | 30 days supply | Qty: 60 | Fill #0

## 2017-04-26 ENCOUNTER — Encounter: Payer: Self-pay | Admitting: Gastroenterology

## 2017-04-26 ENCOUNTER — Telehealth: Payer: Self-pay | Admitting: Cardiology

## 2017-04-26 NOTE — Telephone Encounter (Signed)
Lm2cb-[facility] appt 11-30??

## 2017-04-26 NOTE — Telephone Encounter (Signed)
New Message   Patient is returning call to [place]. Patient was calling to confirm if PCP received test records. A voicemail can be left. Please call.

## 2017-04-26 NOTE — Telephone Encounter (Signed)
Pt aware her recent results were viewable by Dr. Gwynn Burly office, she had already seen Dr. Jenny Reichmann on 12/28 and confirmed this. She called to set up f/u appt. Scheduled her for return visit in May at her request. She is aware to call again if any new concerns or questions, voiced her understanding and thanks.

## 2017-04-30 ENCOUNTER — Ambulatory Visit: Payer: 59

## 2017-04-30 NOTE — Telephone Encounter (Signed)
Unable to contact pt..will await pt callback 

## 2017-05-01 ENCOUNTER — Ambulatory Visit
Admission: RE | Admit: 2017-05-01 | Discharge: 2017-05-01 | Disposition: A | Discharge status: Home / Self Care | Payer: 59 | Source: Ambulatory Visit | Attending: Obstetrics and Gynecology | Admitting: Obstetrics and Gynecology

## 2017-05-01 DIAGNOSIS — Z1231 Encounter for screening mammogram for malignant neoplasm of breast: Secondary | ICD-10-CM

## 2017-05-02 DIAGNOSIS — M549 Dorsalgia, unspecified: Secondary | ICD-10-CM | POA: Diagnosis not present

## 2017-05-02 DIAGNOSIS — R030 Elevated blood-pressure reading, without diagnosis of hypertension: Secondary | ICD-10-CM | POA: Diagnosis not present

## 2017-05-02 DIAGNOSIS — M4316 Spondylolisthesis, lumbar region: Secondary | ICD-10-CM | POA: Diagnosis not present

## 2017-05-02 DIAGNOSIS — M47816 Spondylosis without myelopathy or radiculopathy, lumbar region: Secondary | ICD-10-CM | POA: Diagnosis not present

## 2017-05-02 DIAGNOSIS — Z6833 Body mass index (BMI) 33.0-33.9, adult: Secondary | ICD-10-CM | POA: Diagnosis not present

## 2017-05-04 DIAGNOSIS — M47817 Spondylosis without myelopathy or radiculopathy, lumbosacral region: Secondary | ICD-10-CM | POA: Diagnosis not present

## 2017-05-04 DIAGNOSIS — M47816 Spondylosis without myelopathy or radiculopathy, lumbar region: Secondary | ICD-10-CM | POA: Diagnosis not present

## 2017-05-07 DIAGNOSIS — Z8042 Family history of malignant neoplasm of prostate: Secondary | ICD-10-CM | POA: Diagnosis not present

## 2017-05-07 DIAGNOSIS — Z8049 Family history of malignant neoplasm of other genital organs: Secondary | ICD-10-CM | POA: Diagnosis not present

## 2017-05-07 DIAGNOSIS — Z8041 Family history of malignant neoplasm of ovary: Secondary | ICD-10-CM | POA: Diagnosis not present

## 2017-05-07 DIAGNOSIS — Z803 Family history of malignant neoplasm of breast: Secondary | ICD-10-CM | POA: Diagnosis not present

## 2017-05-07 DIAGNOSIS — Z808 Family history of malignant neoplasm of other organs or systems: Secondary | ICD-10-CM | POA: Diagnosis not present

## 2017-05-07 DIAGNOSIS — Z86010 Personal history of colonic polyps: Secondary | ICD-10-CM | POA: Diagnosis not present

## 2017-05-07 DIAGNOSIS — Z6833 Body mass index (BMI) 33.0-33.9, adult: Secondary | ICD-10-CM | POA: Diagnosis not present

## 2017-05-07 DIAGNOSIS — Z01419 Encounter for gynecological examination (general) (routine) without abnormal findings: Secondary | ICD-10-CM | POA: Diagnosis not present

## 2017-05-09 ENCOUNTER — Other Ambulatory Visit: Payer: Self-pay | Admitting: Internal Medicine

## 2017-05-09 MED FILL — PANTOPRAZOLE SOD DR 40 MG T: 40 | 90 days supply | Qty: 90 | Fill #0

## 2017-05-09 MED FILL — LEVOTHYROXINE 112 MCG TAB: 112 | 90 days supply | Qty: 90 | Fill #0

## 2017-05-11 ENCOUNTER — Encounter: Payer: Self-pay | Admitting: Internal Medicine

## 2017-05-15 ENCOUNTER — Ambulatory Visit: Payer: 59 | Admitting: Family Medicine

## 2017-06-01 DIAGNOSIS — M47816 Spondylosis without myelopathy or radiculopathy, lumbar region: Secondary | ICD-10-CM | POA: Diagnosis not present

## 2017-06-01 DIAGNOSIS — M47817 Spondylosis without myelopathy or radiculopathy, lumbosacral region: Secondary | ICD-10-CM | POA: Diagnosis not present

## 2017-06-03 ENCOUNTER — Encounter (HOSPITAL_COMMUNITY): Payer: Self-pay | Admitting: *Deleted

## 2017-06-03 ENCOUNTER — Other Ambulatory Visit: Payer: Self-pay

## 2017-06-03 ENCOUNTER — Ambulatory Visit (HOSPITAL_COMMUNITY)
Admission: EM | Admit: 2017-06-03 | Discharge: 2017-06-03 | Disposition: A | Discharge status: Home / Self Care | Payer: 59 | Attending: Internal Medicine | Admitting: Internal Medicine

## 2017-06-03 DIAGNOSIS — J069 Acute upper respiratory infection, unspecified: Secondary | ICD-10-CM | POA: Diagnosis not present

## 2017-06-03 DIAGNOSIS — B9789 Other viral agents as the cause of diseases classified elsewhere: Secondary | ICD-10-CM | POA: Diagnosis not present

## 2017-06-03 MED ORDER — PSEUDOEPH-BROMPHEN-DM 30-2-10 MG/5ML PO SYRP: 5.0000 mL | ORAL_SOLUTION | Freq: Four times a day (QID) | ORAL | 0 refills | Status: AC | PRN

## 2017-06-03 MED ORDER — DOXYCYCLINE HYCLATE 100 MG PO CAPS: 100.0000 mg | ORAL_CAPSULE | Freq: Two times a day (BID) | ORAL | 0 refills | Status: AC

## 2017-06-03 NOTE — Discharge Instructions (Signed)
You likely having a viral upper respiratory infection. We recommended symptom control. I expect your symptoms to start improving in the next 1-2 weeks.  You may fill prescription for doxycycline 3 days if symptoms not improving with recommendations below.  1. Take a daily allergy pill/anti-histamine like Zyrtec, Claritin, or Store brand consistently for 2 weeks- this is for congestion and post nasal drip  2. For congestion you may try an oral decongestant like Mucinex or sudafed. You may also try intranasal flonase nasal spray or saline irrigations (neti pot, sinus cleanse)  3. For your sore throat you may try cepacol lozenges, salt water gargles, throat spray. Treatment of congestion may also help your sore throat.  4. For cough you may try Delsym,  Robitussen, or cough syrup that I prescribed.  5. Take Tylenol or Ibuprofen to help with pain/inflammation  6. Stay hydrated, drink plenty of fluids to keep throat coated and less irritated  Honey Tea For cough/sore throat try using a honey-based tea. Use 3 teaspoons of honey with juice squeezed from half lemon. Place shaved pieces of ginger into 1/2-1 cup of water and warm over stove top. Then mix the ingredients and repeat every 4 hours as needed.  Please return if not improving with treatment or worsening.

## 2017-06-03 NOTE — ED Triage Notes (Signed)
Pt states she has cough
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
## 2017-06-14 ENCOUNTER — Ambulatory Visit (AMBULATORY_SURGERY_CENTER): Payer: Self-pay

## 2017-06-14 ENCOUNTER — Other Ambulatory Visit: Payer: Self-pay

## 2017-06-14 VITALS — Ht 64.0 in | Wt 193.6 lb

## 2017-06-14 DIAGNOSIS — Z86010 Personal history of colonic polyps: Secondary | ICD-10-CM

## 2017-06-14 MED ORDER — NA SULFATE-K SULFATE-MG SULF 17.5-3.13-1.6 GM/177ML PO SOLN: 1.0000 | Freq: Once | ORAL | 0 refills | Status: AC

## 2017-06-14 MED FILL — SUPREP BOWEL PREP KIT: 17.5-3.13-1 | 1 days supply | Qty: 354 | Fill #0

## 2017-06-14 NOTE — Progress Notes (Signed)
Denies allergies to eggs or soy products. Denies complication of anesthesia or sedation. Denies use of weight loss medication. Denies use of O2.   Emmi instructions declined.  

## 2017-06-18 ENCOUNTER — Encounter: Payer: Self-pay | Admitting: Gastroenterology

## 2017-06-20 DIAGNOSIS — R030 Elevated blood-pressure reading, without diagnosis of hypertension: Secondary | ICD-10-CM | POA: Diagnosis not present

## 2017-06-20 DIAGNOSIS — Z6832 Body mass index (BMI) 32.0-32.9, adult: Secondary | ICD-10-CM | POA: Diagnosis not present

## 2017-06-20 DIAGNOSIS — M47816 Spondylosis without myelopathy or radiculopathy, lumbar region: Secondary | ICD-10-CM | POA: Diagnosis not present

## 2017-06-20 DIAGNOSIS — M4316 Spondylolisthesis, lumbar region: Secondary | ICD-10-CM | POA: Diagnosis not present

## 2017-06-21 DIAGNOSIS — T4145XA Adverse effect of unspecified anesthetic, initial encounter: Secondary | ICD-10-CM | POA: Insufficient documentation

## 2017-06-21 DIAGNOSIS — T8859XA Other complications of anesthesia, initial encounter: Secondary | ICD-10-CM | POA: Insufficient documentation

## 2017-06-28 ENCOUNTER — Encounter: Payer: Self-pay | Admitting: Gastroenterology

## 2017-06-28 ENCOUNTER — Ambulatory Visit (AMBULATORY_SURGERY_CENTER): Payer: 59 | Admitting: Gastroenterology

## 2017-06-28 VITALS — BP 112/55 | HR 81 | Temp 96.4°F | Resp 13 | Ht 64.0 in | Wt 193.0 lb

## 2017-06-28 DIAGNOSIS — D123 Benign neoplasm of transverse colon: Secondary | ICD-10-CM | POA: Diagnosis not present

## 2017-06-28 DIAGNOSIS — J45909 Unspecified asthma, uncomplicated: Secondary | ICD-10-CM | POA: Diagnosis not present

## 2017-06-28 DIAGNOSIS — D124 Benign neoplasm of descending colon: Secondary | ICD-10-CM | POA: Diagnosis not present

## 2017-06-28 DIAGNOSIS — Z86010 Personal history of colonic polyps: Secondary | ICD-10-CM

## 2017-06-28 DIAGNOSIS — K219 Gastro-esophageal reflux disease without esophagitis: Secondary | ICD-10-CM | POA: Diagnosis not present

## 2017-06-28 MED ORDER — SODIUM CHLORIDE 0.9 % IV SOLN: 500.0000 mL | Freq: Once | INTRAVENOUS | Status: DC

## 2017-06-28 NOTE — Progress Notes (Signed)
Pt's states no medical or surgical changes since previsit or office visit. 

## 2017-06-28 NOTE — Progress Notes (Signed)
Called to room to assist during endoscopic procedure.  Patient ID and intended procedure confirmed with present staff. Received instructions for my participation in the procedure from the performing physician.  

## 2017-06-28 NOTE — Patient Instructions (Signed)
**   Handouts given on polyps and diverticulosis **   YOU HAD AN ENDOSCOPIC PROCEDURE TODAY AT THE [facility]:   Refer to the procedure report that was given to you for any specific questions about what was found during the examination.  If the procedure report does not answer your questions, please call your gastroenterologist to clarify.  If you requested that your care partner not be given the details of your procedure findings, then the procedure report has been included in a sealed envelope for you to review at your convenience later.  YOU SHOULD EXPECT: Some feelings of bloating in the abdomen. Passage of more gas than usual.  Walking can help get rid of the air that was put into your GI tract during the procedure and reduce the bloating. If you had a lower endoscopy (such as a colonoscopy or flexible sigmoidoscopy) you may notice spotting of blood in your stool or on the toilet paper. If you underwent a bowel prep for your procedure, you may not have a normal bowel movement for a few days.  Please Note:  You might notice some irritation and congestion in your nose or some drainage.  This is from the oxygen used during your procedure.  There is no need for concern and it should clear up in a day or so.  SYMPTOMS TO REPORT IMMEDIATELY:   Following lower endoscopy (colonoscopy or flexible sigmoidoscopy):  Excessive amounts of blood in the stool  Significant tenderness or worsening of abdominal pains  Swelling of the abdomen that is new, acute  Fever of 100F or higher  For urgent or emergent issues, a gastroenterologist can be reached at any hour by calling [phone].   DIET:  We do recommend a small meal at first, but then you may proceed to your regular diet.  Drink plenty of fluids but you should avoid alcoholic beverages for 24 hours.  ACTIVITY:  You should plan to take it easy for the rest of today and you should NOT DRIVE or use heavy machinery until tomorrow (because  of the sedation medicines used during the test).    FOLLOW UP: Our staff will call the number listed on your records the next business day following your procedure to check on you and address any questions or concerns that you may have regarding the information given to you following your procedure. If we do not reach you, we will leave a message.  However, if you are feeling well and you are not experiencing any problems, there is no need to return our call.  We will assume that you have returned to your regular daily activities without incident.  If any biopsies were taken you will be contacted by phone or by letter within the next 1-3 weeks.  Please call us at [phone] if you have not heard about the biopsies in 3 weeks.    SIGNATURES/CONFIDENTIALITY: You and/or your care partner have signed paperwork which will be entered into your electronic medical record.  These signatures attest to the fact that that the information above on your After Visit Summary has been reviewed and is understood.  Full responsibility of the confidentiality of this discharge information lies with you and/or your care-partner. 

## 2017-06-28 NOTE — Op Note (Signed)
[facility] Patient Name: [name] Procedure Date: 06/28/2017 11:25 AM MRN: [id number] Endoscopist: Mallie Mussel L. Loletha Carrow , MD Age: 62 Referring MD:  Date of Birth: [birth year] Gender: Female Account #: [id number] Procedure:                Colonoscopy Indications:              Surveillance: Personal history of adenomatous                            polyps on last colonoscopy > 5 years ago (62mm TA                            09/2011) Medicines:                Monitored Anesthesia Care Procedure:                Pre-Anesthesia Assessment:                           - Prior to the procedure, a History and Physical                            was performed, and patient medications and                            allergies were reviewed. The patient's tolerance of                            previous anesthesia was also reviewed. The risks                            and benefits of the procedure and the sedation                            options and risks were discussed with the patient.                            All questions were answered, and informed consent                            was obtained. Prior Anticoagulants: The patient has                            taken no previous anticoagulant or antiplatelet                            agents. ASA Grade Assessment: II - A patient with                            mild systemic disease. After reviewing the risks                            and benefits, the patient was deemed in  satisfactory condition to undergo the procedure.                           After obtaining informed consent, the colonoscope                            was passed under direct vision. Throughout the                            procedure, the patient's blood pressure, pulse, and                            oxygen saturations were monitored continuously. The                            Colonoscope was introduced through the anus and                     advanced to the the cecum, identified by                            appendiceal orifice and ileocecal valve. The                            colonoscopy was performed without difficulty. The                            patient tolerated the procedure well. The quality                            of the bowel preparation was good. The ileocecal                            valve, appendiceal orifice, and rectum were                            photographed. The quality of the bowel preparation                            was evaluated using the BBPS [facility] Bowel                            Preparation Scale) with scores of: Right Colon = 2,                            Transverse Colon = 2 and Left Colon = 2. The total                            BBPS score equals 6. Scope In: 11:35:27 AM Scope Out: 11:54:05 AM Scope Withdrawal Time: 0 hours 13 minutes 55 seconds  Total Procedure Duration: 0 hours 18 minutes 38 seconds  Findings:                 The perianal and digital rectal examinations were  normal.                           Many large-mouthed diverticula were found in the                            left colon.                           Two sessile polyps were found in the descending                            colon and distal transverse colon. The polyps were                            4 to 6 mm in size. These polyps were removed with a                            cold snare. Resection and retrieval were complete.                           The exam was otherwise without abnormality on                            direct and retroflexion views. Complications:            No immediate complications. Estimated Blood Loss:     Estimated blood loss was minimal. Impression:               - Diverticulosis in the left colon.                           - Two 4 to 6 mm polyps in the descending colon and                            in the distal transverse colon,  removed with a cold                            snare. Resected and retrieved.                           - The examination was otherwise normal on direct                            and retroflexion views. Recommendation:           - Patient has a contact number available for                            emergencies. The signs and symptoms of potential                            delayed complications were discussed with the  patient. Return to normal activities tomorrow.                            Written discharge instructions were provided to the                            patient.                           - Resume previous diet.                           - Continue present medications.                           - Await pathology results.                           - Repeat colonoscopy is recommended for                            surveillance. The colonoscopy date will be                            determined after pathology results from today's                            exam become available for review. Henry L. Loletha Carrow, MD 06/28/2017 11:58:33 AM This report has been signed electronically.

## 2017-06-28 NOTE — Progress Notes (Signed)
Patient complain of left hip pain upon arrival to recovery room. Rank pain a level of 3. Able to extend and flex left left without difficulty. No redness or discoloration at site. Instructed may have tylenol or other pain medication once she is home. States ill be fine.

## 2017-06-28 NOTE — Progress Notes (Signed)
Report to PACU, RN, vss, BBS= Clear.  

## 2017-06-29 ENCOUNTER — Telehealth: Payer: Self-pay | Admitting: *Deleted

## 2017-06-29 NOTE — Telephone Encounter (Signed)
  Follow up Call-  Call back number 06/28/2017  Post procedure Call Back phone  # [phone]  Permission to leave phone message Yes  Some recent data might be hidden     Patient questions:  Do you have a fever, pain , or abdominal swelling? No. Pain Score  0 *  Have you tolerated food without any problems? Yes.    Have you been able to return to your normal activities? Yes.    Do you have any questions about your discharge instructions: Diet   No. Medications  No. Follow up visit  No.  Do you have questions or concerns about your Care? No.  Actions: * If pain score is 4 or above: No action needed, pain <4.

## 2017-07-05 ENCOUNTER — Encounter: Payer: Self-pay | Admitting: Gastroenterology

## 2017-07-19 ENCOUNTER — Ambulatory Visit: Payer: 59 | Admitting: Internal Medicine

## 2017-07-19 ENCOUNTER — Encounter: Payer: Self-pay | Admitting: Internal Medicine

## 2017-07-19 VITALS — BP 116/82 | HR 91 | Temp 97.8°F | Wt 192.0 lb

## 2017-07-19 DIAGNOSIS — M79669 Pain in unspecified lower leg: Secondary | ICD-10-CM

## 2017-07-19 DIAGNOSIS — R7302 Impaired glucose tolerance (oral): Secondary | ICD-10-CM | POA: Diagnosis not present

## 2017-07-19 DIAGNOSIS — M7989 Other specified soft tissue disorders: Secondary | ICD-10-CM

## 2017-07-19 DIAGNOSIS — E785 Hyperlipidemia, unspecified: Secondary | ICD-10-CM

## 2017-07-19 MED ORDER — FUROSEMIDE 40 MG PO TABS: 40.0000 mg | ORAL_TABLET | Freq: Every day | ORAL | 3 refills | Status: DC

## 2017-07-19 MED FILL — FUROSEMIDE 40 MG TAB: 40 | 90 days supply | Qty: 90 | Fill #0
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
## 2017-07-19 NOTE — Patient Instructions (Signed)
Ok to continue the lasix, compression stockings, leg elevation and no added salt diet  OK to hold on taking the crestor  Please continue all other medications as before, and refills have been done if requested.  Please have the pharmacy call with any other refills you may need.  Please keep your appointments with your specialists as you may have planned  You will be contacted regarding the referral for: Venous Dopplers to both legs  - to see PCCs now

## 2017-07-20 ENCOUNTER — Ambulatory Visit (HOSPITAL_COMMUNITY)
Admission: RE | Admit: 2017-07-20 | Discharge: 2017-07-20 | Disposition: A | Discharge status: Home / Self Care | Payer: 59 | Source: Ambulatory Visit | Attending: Internal Medicine | Admitting: Internal Medicine

## 2017-07-20 DIAGNOSIS — M79661 Pain in right lower leg: Secondary | ICD-10-CM | POA: Insufficient documentation

## 2017-07-20 DIAGNOSIS — M79662 Pain in left lower leg: Secondary | ICD-10-CM | POA: Diagnosis not present

## 2017-07-20 DIAGNOSIS — M79669 Pain in unspecified lower leg: Secondary | ICD-10-CM

## 2017-07-20 DIAGNOSIS — M7989 Other specified soft tissue disorders: Secondary | ICD-10-CM | POA: Insufficient documentation

## 2017-07-20 NOTE — Progress Notes (Signed)
Bilateral lower extremity venous duplex has been completed. Negative for DVT. Results were given to Shirron at Dr. Gwynn Burly office.  07/20/17 9:18 AM [name] RVT

## 2017-07-21 ENCOUNTER — Other Ambulatory Visit: Payer: Self-pay

## 2017-07-21 ENCOUNTER — Encounter: Payer: Self-pay | Admitting: Physician Assistant

## 2017-07-21 ENCOUNTER — Ambulatory Visit: Payer: 59 | Admitting: Physician Assistant

## 2017-07-21 ENCOUNTER — Ambulatory Visit (INDEPENDENT_AMBULATORY_CARE_PROVIDER_SITE_OTHER): Payer: 59

## 2017-07-21 VITALS — BP 104/69 | HR 76 | Temp 98.4°F | Resp 16 | Ht 63.5 in | Wt 188.8 lb

## 2017-07-21 DIAGNOSIS — M545 Low back pain, unspecified: Secondary | ICD-10-CM

## 2017-07-21 DIAGNOSIS — R109 Unspecified abdominal pain: Secondary | ICD-10-CM | POA: Diagnosis not present

## 2017-07-21 DIAGNOSIS — R102 Pelvic and perineal pain: Secondary | ICD-10-CM | POA: Diagnosis not present

## 2017-07-21 DIAGNOSIS — R82998 Other abnormal findings in urine: Secondary | ICD-10-CM

## 2017-07-21 LAB — POC MICROSCOPIC URINALYSIS (UMFC): Mucus: ABSENT

## 2017-07-21 LAB — POCT CBC
Granulocyte percent: 60.3 %G (ref 37–80)
HCT, POC: 43.1 % (ref 37.7–47.9)
Hemoglobin: 14.3 g/dL (ref 12.2–16.2)
Lymph, poc: 1.6 (ref 0.6–3.4)
MCH, POC: 30.7 pg (ref 27–31.2)
MCHC: 33.2 g/dL (ref 31.8–35.4)
MCV: 92.5 fL (ref 80–97)
MID (cbc): 0.4 (ref 0–0.9)
MPV: 6.8 fL (ref 0–99.8)
POC Granulocyte: 3 (ref 2–6.9)
POC LYMPH PERCENT: 31.6 %L (ref 10–50)
POC MID %: 8.1 %M (ref 0–12)
Platelet Count, POC: 274 10*3/uL (ref 142–424)
RBC: 4.66 M/uL (ref 4.04–5.48)
RDW, POC: 13.7 %
WBC: 5 10*3/uL (ref 4.6–10.2)

## 2017-07-21 LAB — POCT URINALYSIS DIP (MANUAL ENTRY)
Bilirubin, UA: NEGATIVE
Blood, UA: NEGATIVE
Glucose, UA: NEGATIVE mg/dL
Ketones, POC UA: NEGATIVE mg/dL
Nitrite, UA: NEGATIVE
Protein Ur, POC: NEGATIVE mg/dL
Spec Grav, UA: 1.015 (ref 1.010–1.025)
Urobilinogen, UA: 0.2 E.U./dL
pH, UA: 7 (ref 5.0–8.0)

## 2017-07-21 MED ORDER — CEPHALEXIN 500 MG PO CAPS: 500.0000 mg | ORAL_CAPSULE | Freq: Two times a day (BID) | ORAL | 0 refills | Status: AC

## 2017-07-21 NOTE — Progress Notes (Signed)
[name]  MRN: [id number] DOB: 06-06-1955  Subjective:  [name] is a 62 y.o. female seen in office today for a chief complaint of intermittent sharp suprapubic pain x 1 week. Now started having discomfort in right low back region. Has associated nausea, dysuria, urinary frequency,  and urinary hesitancy. Denies fever, chills, vomiting, diaphoresis, and hematuria. Denies heavy lifting. Pain is 5/10, sometimes 8/10. Drinks at least 7 glasses of water a day. Has cut back on tea. Has tried ibuprofen and tylenol with some relief. Has hx of kidney stones and UTI in the past. States the pain reminds her of both of those.  Has hx of chronic low back pain but has not had issues with that since 03/2017. Has hx of ovarian cyst.  No hx of kidney failure. PSH of cholecystectomy and hysterectomy. PCP is Dr. Jenny [name].  Review of Systems  Constitutional: Negative for appetite change and fatigue.  Gastrointestinal: Negative for blood in stool, constipation and diarrhea.  Genitourinary: Negative for vaginal discharge and vaginal pain.    Patient Active Problem List   Diagnosis Date Noted  . Complication of anesthesia   . Right groin pain 04/21/2017  . Hyperlipidemia LDL goal <70 03/23/2017  . Dorsalgia 03/01/2017  . Rapid palpitations 03/01/2017  . Preop cardiovascular exam 03/01/2017  . Right lumbar radiculopathy 08/15/2016  . Rib pain on right side 03/03/2016  . Left-sided nosebleed 03/03/2016  . Acute colitis 08/30/2013  . GERD (gastroesophageal reflux disease) 08/14/2012  . Bilateral leg and foot pain 10/23/2011  . Depression 07/10/2011  . History of renal stone 04/15/2011  . Pleural effusion 04/14/2011  . Impaired glucose tolerance 04/14/2011  . Hypokalemia 04/14/2011  . Chronic low back pain 03/29/2011  . Obesity 03/29/2011  . Preventative health care 12/30/2010  . THYROID NODULE, RIGHT 02/21/2010  . NECK PAIN, RIGHT 02/21/2010  . Bilateral lower extremity edema 02/21/2010  .  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 FATIGUE 03/11/2009  . SINUSITIS, CHRONIC 11/27/2008  . Hypothyroidism 07/19/2007  . Anxiety state 07/19/2007  . ALLERGIC RHINITIS 07/19/2007  . DIVERTICULOSIS, COLON 07/19/2007  . DEGENERATIVE DISC DISEASE 07/19/2007  . Chest pain with moderate risk for cardiac etiology 07/19/2007  . COLONIC POLYPS, HX OF 07/19/2007    Current Outpatient Medications on File Prior to Visit  Medication Sig Dispense Refill  . acetaminophen (TYLENOL) 500 MG tablet Take 500 mg every 6 (six) hours as needed by mouth for moderate pain.     [name] albuterol (PROVENTIL HFA;VENTOLIN HFA) 108 (90 Base) MCG/ACT inhaler Inhale 2 puffs into the lungs every 6 (six) hours as needed for wheezing or shortness of breath. 1 Inhaler 5  . furosemide (LASIX) 40 MG tablet Take 1 tablet (40 mg total) by mouth daily. 90 tablet 3  . ibuprofen (ADVIL,MOTRIN) 200 MG tablet Take 200 mg by mouth every 6 (six) hours as needed.    [name] levothyroxine (SYNTHROID, LEVOTHROID) 112 MCG tablet TAKE 1 TABLET BY MOUTH DAILY 90 tablet 3  . loratadine (CLARITIN) 10 MG tablet Take 10 mg daily as needed by mouth.     . pantoprazole (PROTONIX) 40 MG tablet TAKE 1 TABLET BY MOUTH DAILY 90 tablet 3  . POTASSIUM PO Take by mouth daily.    . rosuvastatin (CRESTOR) 10 MG tablet Take 1 tablet (10 mg total) by mouth daily. (Patient not taking: Reported on 07/21/2017) 90 tablet 3  . tiZANidine (ZANAFLEX) 4 MG tablet Take 4 mg by mouth. Take one pill 1-3 times daily  No current facility-administered medications on file prior to visit.     Allergies  Allergen Reactions  . Amoxicillin Shortness Of Breath and Rash    REACTION: rash, sob - tol kelfex and rocephn hosp 06/2009 Has patient had a PCN reaction causing immediate rash, facial/tongue/throat swelling, SOB or lightheadedness with hypotension: YES Has patient had a PCN reaction causing severe rash involving mucus membranes or skin necrosis: YES Has patient had a PCN reaction  that required hospitalization UNKNOWN Has patient had a PCN reaction occurring within the last 10 years: NO If all of the above answers are "NO", then may proceed with Cephalosporin use  . Aspirin Shortness Of Breath and Rash  . Latex Shortness Of Breath and Dermatitis    Blisters on skin  . Sulfonamide Derivatives Shortness Of Breath and Rash    REACTION: ?rash, sob - but reports bactrim tolerence  . Sulfa Antibiotics   . Avelox [Moxifloxacin Hcl In Nacl] Hives    TOLERATES CIPRO   Past Surgical History:  Procedure Laterality Date  . ABDOMINAL HYSTERECTOMY    . BLADDER SURGERY    . BREAST BIOPSY Bilateral   . BREAST EXCISIONAL BIOPSY Left   . BREAST SURGERY  11/2007   Biopsy  . CHOLECYSTECTOMY    . CYSTOSCOPY  2008  . LUMBAR LAMINECTOMY/DECOMPRESSION MICRODISCECTOMY Left 12/18/2012   Procedure: Left Lumbar Five Sacral One Extraforaminal Microdiskectomy;  Surgeon: Floyce Stakes, MD;  Location: [facility];  Service: Neurosurgery;  Laterality: Left;  LUMBAR LAMINECTOMY/DECOMPRESSION MICRODISCECTOMY 1 LEVEL  . NASAL SINUS SURGERY     x3 - to remove a tooth  . OVARIAN CYST SURGERY    . WISDOM TOOTH EXTRACTION        Objective:  BP 104/69 (BP Location: Right Arm)   Pulse 76   Temp 98.4 F (36.9 C) (Oral)   Resp 16   Ht 5' 3.5" (1.613 m)   Wt 188 lb 12.8 oz (85.6 kg)   SpO2 98%   BMI 32.92 kg/m   Physical Exam  Constitutional: She is oriented to person, place, and time and well-developed, well-nourished, and in no distress.  HENT:  Head: Normocephalic and atraumatic.  Eyes: Conjunctivae are normal.  Neck: Normal range of motion.  Pulmonary/Chest: Effort normal.  Abdominal: Soft. Normal appearance and bowel sounds are normal. There is tenderness (mild) in the suprapubic area. There is no rigidity, no rebound, no guarding, no CVA tenderness, no tenderness at McBurney's point and negative Murphy's sign.  Musculoskeletal:       Lumbar back: She exhibits normal range of  motion and no bony tenderness.       Back:       Arms: Neurological: She is alert and oriented to person, place, and time. She has a normal Straight Leg Raise Test. Gait normal.  Reflex Scores:      Patellar reflexes are 2+ on the right side and 2+ on the left side.      Achilles reflexes are 2+ on the right side and 2+ on the left side. Muscular strength 5/5 of bilateral lower extremities. Sensation of bilateral lower extremities intact.   Skin: Skin is warm and dry.  Psychiatric: Affect normal.  Vitals reviewed.    Results for orders placed or performed in visit on 07/21/17 (from the past 24 hour(s))  POCT CBC     Status: None   Collection Time: 07/21/17 10:51 AM  Result Value Ref Range   WBC 5.0 4.6 - 10.2 K/uL  Lymph, poc 1.6 0.6 - 3.4   POC LYMPH PERCENT 31.6 10 - 50 %L   MID (cbc) 0.4 0 - 0.9   POC MID % 8.1 0 - 12 %M   POC Granulocyte 3.0 2 - 6.9   Granulocyte percent 60.3 37 - 80 %G   RBC 4.66 4.04 - 5.48 M/uL   Hemoglobin 14.3 12.2 - 16.2 g/dL   HCT, POC 43.1 37.7 - 47.9 %   MCV 92.5 80 - 97 fL   MCH, POC 30.7 27 - 31.2 pg   MCHC 33.2 31.8 - 35.4 g/dL   RDW, POC 13.7 %   Platelet Count, POC 274 142 - 424 K/uL   MPV 6.8 0 - 99.8 fL  POCT urinalysis dipstick     Status: Abnormal   Collection Time: 07/21/17 10:52 AM  Result Value Ref Range   Color, UA yellow yellow   Clarity, UA clear clear   Glucose, UA negative negative mg/dL   Bilirubin, UA negative negative   Ketones, POC UA negative negative mg/dL   Spec Grav, UA 1.015 1.010 - 1.025   Blood, UA negative negative   pH, UA 7.0 5.0 - 8.0   Protein Ur, POC negative negative mg/dL   Urobilinogen, UA 0.2 0.2 or 1.0 E.U./dL   Nitrite, UA Negative Negative   Leukocytes, UA Trace (A) Negative  POCT Microscopic Urinalysis ([facility])     Status: Abnormal   Collection Time: 07/21/17 10:56 AM  Result Value Ref Range   WBC,UR,HPF,POC Few (A) None WBC/hpf   RBC,UR,HPF,POC None None RBC/hpf   Bacteria None None, Too  numerous to count   Mucus Absent Absent   Epithelial Cells, UR Per Microscopy Few (A) None, Too numerous to count cells/hpf    Dg Abd 1 View  Result Date: 07/21/2017 CLINICAL DATA:  Acute RIGHT back, flank and groin pain for 1 week. EXAM: ABDOMEN - 1 VIEW COMPARISON:  04/20/2017 and prior radiographs FINDINGS: The bowel gas pattern is unremarkable. Calcifications within the anatomic pelvis are unchanged prior radiograph compatible with phleboliths. No new calcifications are identified overlying the renal shadows or expected course of the ureters to suggest urinary calculi. Cholecystectomy clips are present. No acute bony abnormalities. IMPRESSION: No acute abnormalities.  No evidence of radiopaque urinary calculi. Electronically Signed   By: Margarette Canada M.D.   On: 07/21/2017 11:01    Assessment and Plan :  Pt has PCN allergy but states she has had keflex multiple times in in the past with no issues.   1. Acute right-sided low back pain without sciatica Pt is overall well appearing, no distress. Vitals stable. There is mild tenderness with palpation of right sided low back musculature on exam. Neuro exam normal. No hematuria or calculi on KUB to suggest kidney stone. UA with trace leuks and few WBC. CBC normal. Sx set and labs most suspicious for UTI. Also, may be experiencing some low back muscle pain. Urine cx pending. Recommend tx with abx and rest at this time. Follow up in 2 days for reevaluation or sooner if sx worsen/develop new concerning sx.  - POCT CBC - POCT urinalysis dipstick - POCT Microscopic Urinalysis ([facility]) - DG Abd 1 View; Future  2. Suprapubic abdominal pain - POCT CBC - POCT urinalysis dipstick - POCT Microscopic Urinalysis ([facility]) - CT RENAL STONE STUDY; Future  3. Leukocytes in urine - Urine Culture - cephALEXin (KEFLEX) 500 MG capsule; Take 1 capsule (500 mg total) by mouth 2 (two) times daily for  7 days.  Dispense: 14 capsule; Refill: 0  Tenna Delaine PA-C    [facility] 07/21/2017 11:08 AM

## 2017-07-21 NOTE — Patient Instructions (Addendum)
Your results indicate you have a UTI. I have given you a prescription for an antibiotic. Please take with food. I have sent off a urine culture and we should have those results in 48 hours. If your symptoms worsen while you are awaiting these results or you develop fever, chills, blood in urine, or vomiting, please seek care immediately.   I also recommend continuing with ibuprofen as prescribed and drinking at least 64 oz of water a day.   Follow up with me on Monday.   IF you received an x-ray today, you will receive an invoice from [facility]. Please contact [facility] at [phone] with questions or concerns regarding your invoice.   IF you received labwork today, you will receive an invoice from [facility]. Please contact [facility] at [phone] with questions or concerns regarding your invoice.   Our billing staff will not be able to assist you with questions regarding bills from these companies.  You will be contacted with the lab results as soon as they are available. The fastest way to get your results is to activate your My Chart account. Instructions are located on the last page of this paperwork. If you have not heard from [place] regarding the results in 2 weeks, please contact this office.

## 2017-07-22 NOTE — Assessment & Plan Note (Signed)
stable overall by history and exam, recent data reviewed with pt, and pt to continue medical treatment as before,  to f/u any worsening symptoms or concerns' Lab Results  Component Value Date   HGBA1C 5.5 04/05/2015

## 2017-07-22 NOTE — Assessment & Plan Note (Signed)
bilat symmetrical, suspect mild worsening of venous insufficiency of which she has not had much prior; cant r/o DVT, will need venous dopplers, but will restart lasix,. For compression stockings, leg elevation, low salt diet and wt loss

## 2017-07-22 NOTE — Assessment & Plan Note (Signed)
With increased cramping, to hold crestor for 2 wks, but to restart if no change in cramping overall, for lower chol diet

## 2017-07-23 ENCOUNTER — Other Ambulatory Visit: Payer: Self-pay

## 2017-07-23 ENCOUNTER — Encounter: Payer: Self-pay | Admitting: Physician Assistant

## 2017-07-23 ENCOUNTER — Ambulatory Visit: Payer: 59 | Admitting: Physician Assistant

## 2017-07-23 VITALS — BP 112/66 | HR 86 | Temp 97.8°F | Resp 18 | Ht 63.9 in | Wt 191.4 lb

## 2017-07-23 DIAGNOSIS — M545 Low back pain, unspecified: Secondary | ICD-10-CM

## 2017-07-23 DIAGNOSIS — N390 Urinary tract infection, site not specified: Secondary | ICD-10-CM

## 2017-07-23 LAB — POCT URINALYSIS DIP (MANUAL ENTRY)
Bilirubin, UA: NEGATIVE
Blood, UA: NEGATIVE
Glucose, UA: NEGATIVE mg/dL
Ketones, POC UA: NEGATIVE mg/dL
Leukocytes, UA: NEGATIVE
Nitrite, UA: NEGATIVE
Protein Ur, POC: NEGATIVE mg/dL
Spec Grav, UA: 1.01 (ref 1.010–1.025)
Urobilinogen, UA: 0.2 E.U./dL
pH, UA: 6.5 (ref 5.0–8.0)

## 2017-07-23 LAB — URINE CULTURE

## 2017-07-23 MED ORDER — CYCLOBENZAPRINE HCL 5 MG PO TABS: 5.0000 mg | ORAL_TABLET | Freq: Three times a day (TID) | ORAL | 0 refills | Status: DC | PRN

## 2017-07-23 MED FILL — CYCLOBENZAPRINE 5 MG TABLET: 5 | 10 days supply | Qty: 30 | Fill #0

## 2017-07-23 NOTE — Progress Notes (Signed)
[name]  MRN: [id number] DOB: 1956/02/02  Subjective:  [name] is a 62 y.o. female seen in office today for a chief complaint of follow-up on low back pain and suprapubic cramping.  Patient was initially seen on 07/21/17.  Treated empirically for UTI.  Please see that note for additional details.  Since then, she has been taking Keflex once a day instead of twice a day.  Notes the pain is improved from 8/10 to a 4/10.  Her urinary flow has significantly improved. She is still having occasional right lower back pain.  It is worse at nighttime. Seems to be related to position.  It is relieved with zanaflex but she only has three tablets left.  Denies dysuria, urinary frequency, urgency, hematuria, numbness, tingling, and radiating symptoms.  Has no other questions or concerns.  Review of Systems  Constitutional: Negative for chills, diaphoresis and fever.  Gastrointestinal: Negative for abdominal pain, nausea and vomiting.  Neurological: Negative for dizziness, light-headedness and numbness.    Patient Active Problem List   Diagnosis Date Noted  . Complication of anesthesia   . Right groin pain 04/21/2017  . Hyperlipidemia LDL goal <70 03/23/2017  . Dorsalgia 03/01/2017  . Rapid palpitations 03/01/2017  . Preop cardiovascular exam 03/01/2017  . Right lumbar radiculopathy 08/15/2016  . Rib pain on right side 03/03/2016  . Left-sided nosebleed 03/03/2016  . Acute colitis 08/30/2013  . GERD (gastroesophageal reflux disease) 08/14/2012  . Pain and swelling of lower leg 10/23/2011  . Depression 07/10/2011  . History of renal stone 04/15/2011  . Pleural effusion 04/14/2011  . Impaired glucose tolerance 04/14/2011  . Hypokalemia 04/14/2011  . Chronic low back pain 03/29/2011  . Obesity 03/29/2011  . Preventative health care 12/30/2010  . THYROID NODULE, RIGHT 02/21/2010  . NECK PAIN, RIGHT 02/21/2010  . Bilateral lower extremity edema 02/21/2010  . Atony of bladder  11/10/2009  . FATIGUE 03/11/2009  . SINUSITIS, CHRONIC 11/27/2008  . Hypothyroidism 07/19/2007  . Anxiety state 07/19/2007  . ALLERGIC RHINITIS 07/19/2007  . DIVERTICULOSIS, COLON 07/19/2007  . DEGENERATIVE DISC DISEASE 07/19/2007  . Chest pain with moderate risk for cardiac etiology 07/19/2007  . COLONIC POLYPS, HX OF 07/19/2007    Current Outpatient Medications on File Prior to Visit  Medication Sig Dispense Refill  . acetaminophen (TYLENOL) 500 MG tablet Take 500 mg every 6 (six) hours as needed by mouth for moderate pain.     [name] albuterol (PROVENTIL HFA;VENTOLIN HFA) 108 (90 Base) MCG/ACT inhaler Inhale 2 puffs into the lungs every 6 (six) hours as needed for wheezing or shortness of breath. 1 Inhaler 5  . cephALEXin (KEFLEX) 500 MG capsule Take 1 capsule (500 mg total) by mouth 2 (two) times daily for 7 days. 14 capsule 0  . furosemide (LASIX) 40 MG tablet Take 1 tablet (40 mg total) by mouth daily. 90 tablet 3  . ibuprofen (ADVIL,MOTRIN) 200 MG tablet Take 200 mg by mouth every 6 (six) hours as needed.    [name] levothyroxine (SYNTHROID, LEVOTHROID) 112 MCG tablet TAKE 1 TABLET BY MOUTH DAILY 90 tablet 3  . loratadine (CLARITIN) 10 MG tablet Take 10 mg daily as needed by mouth.     . pantoprazole (PROTONIX) 40 MG tablet TAKE 1 TABLET BY MOUTH DAILY 90 tablet 3  . POTASSIUM PO Take by mouth daily.    . rosuvastatin (CRESTOR) 10 MG tablet Take 1 tablet (10 mg total) by mouth daily. 90 tablet 3  . tiZANidine (ZANAFLEX)  4 MG tablet Take 4 mg by mouth. Take one pill 1-3 times daily     No current facility-administered medications on file prior to visit.     Allergies  Allergen Reactions  . Amoxicillin Shortness Of Breath and Rash    REACTION: rash, sob - tol kelfex and roceph[facility] hosp 06/2009 Has patient had a PCN reaction causing immediate rash, facial/tongue/throat swelling, SOB or lightheadedness with hypotension: YES Has patient had a PCN reaction causing severe rash involving mucus  membranes or skin necrosis: YES Has patient had a PCN reaction that required hospitalization UNKNOWN Has patient had a PCN reaction occurring within the last 10 years: NO If all of the above answers are "NO", then may proceed with Cephalosporin use  . Aspirin Shortness Of Breath and Rash  . Latex Shortness Of Breath and Dermatitis    Blisters on skin  . Sulfonamide Derivatives Shortness Of Breath and Rash    REACTION: ?rash, sob - but reports bactrim tolerence  . Sulfa Antibiotics   . Avelox [Moxifloxacin Hcl In Nacl] Hives    TOLERATES CIPRO     Objective:  BP 112/66 (BP Location: Left Arm, Patient Position: Sitting, Cuff Size: Large)   Pulse 86   Temp 97.8 F (36.6 C) (Oral)   Resp 18   Ht 5' 3.9" (1.623 m)   Wt 191 lb 6.4 oz (86.8 kg)   SpO2 99%   BMI 32.96 kg/m   Physical Exam  Constitutional: She is oriented to person, place, and time and well-developed, well-nourished, and in no distress.  HENT:  Head: Normocephalic and atraumatic.  Eyes: Conjunctivae are normal.  Neck: Normal range of motion.  Pulmonary/Chest: Effort normal.  Abdominal: Soft. Normal appearance. There is no tenderness. There is no CVA tenderness.  Musculoskeletal:       Lumbar back: Normal. She exhibits normal range of motion, no tenderness and no bony tenderness.  Neurological: She is alert and oriented to person, place, and time. Gait normal.  Skin: Skin is warm and dry.  Psychiatric: Affect normal.  Vitals reviewed.    Results for orders placed or performed in visit on 07/23/17 (from the past 24 hour(s))  POCT urinalysis dipstick     Status: None   Collection Time: 07/23/17  4:10 PM  Result Value Ref Range   Color, UA yellow yellow   Clarity, UA clear clear   Glucose, UA negative negative mg/dL   Bilirubin, UA negative negative   Ketones, POC UA negative negative mg/dL   Spec Grav, UA 1.010 1.010 - 1.025   Blood, UA negative negative   pH, UA 6.5 5.0 - 8.0   Protein Ur, POC negative  negative mg/dL   Urobilinogen, UA 0.2 0.2 or 1.0 E.U./dL   Nitrite, UA Negative Negative   Leukocytes, UA Negative Negative     Assessment and Plan :  1. Acute right-sided low back pain without sciatica 2. Urinary tract infection without hematuria, site unspecified Patient is overall well-appearing, no distress.  Vital stable.  Back pain has improved.  UTI symptoms have resolved.  UA today normal.  Recommend taking antibiotic for UTI as prescribed twice a day.  Given Rx for Flexeril to take as needed for back pain.  Educated to not use with Zanaflex.  Advised patient to return to clinic if symptoms worsen, do not improve, or as needed. - POCT urinalysis dipstick  Tenna Delaine PA-C  [facility] 07/23/2017 4:16 PM

## 2017-07-23 NOTE — Patient Instructions (Addendum)
  Your urine looks much better than it did at your last visit.  This is all likely UTI.  I recommend continue with Keflex 1 pill twice a day as prescribed.  I have also given you a refill for Flexeril to use for low back pain.  Do not use this medication with Zanaflex as it is a similar medication.  Since your urine has cleared up and your symptoms are improving, we do not need to do a CT renal study.  If any of your symptoms persist after you complete your antibiotic or any new symptoms return, please return office for further evaluation.   IF you received an x-ray today, you will receive an invoice from [facility]. Please contact [facility] at [phone] with questions or concerns regarding your invoice.   IF you received labwork today, you will receive an invoice from [facility]. Please contact [facility] at [phone] with questions or concerns regarding your invoice.   Our billing staff will not be able to assist you with questions regarding bills from these companies.  You will be contacted with the lab results as soon as they are available. The fastest way to get your results is to activate your My Chart account. Instructions are located on the last page of this paperwork. If you have not heard from [place] regarding the results in 2 weeks, please contact this office.

## 2017-07-24 ENCOUNTER — Encounter: Payer: Self-pay | Admitting: Physician Assistant

## 2017-08-07 MED FILL — PANTOPRAZOLE SOD DR 40 MG T: 40 | 90 days supply | Qty: 90 | Fill #1

## 2017-08-07 MED FILL — LEVOTHYROXINE 112 MCG TAB: 112 | 90 days supply | Qty: 90 | Fill #1

## 2017-08-19 ENCOUNTER — Telehealth: Payer: 59 | Admitting: Family Medicine

## 2017-08-19 DIAGNOSIS — J329 Chronic sinusitis, unspecified: Secondary | ICD-10-CM

## 2017-08-19 MED ORDER — BENZONATATE 100 MG PO CAPS: 100.0000 mg | ORAL_CAPSULE | Freq: Three times a day (TID) | ORAL | 0 refills | Status: DC | PRN

## 2017-08-19 MED ORDER — LEVOCETIRIZINE DIHYDROCHLORIDE 5 MG PO TABS: 5.0000 mg | ORAL_TABLET | Freq: Every evening | ORAL | 1 refills | Status: DC

## 2017-08-19 MED ORDER — DOXYCYCLINE HYCLATE 100 MG PO CAPS: 100.0000 mg | ORAL_CAPSULE | Freq: Two times a day (BID) | ORAL | 0 refills | Status: AC

## 2017-08-19 MED ORDER — IPRATROPIUM BROMIDE 0.03 % NA SOLN: 2.0000 | Freq: Two times a day (BID) | NASAL | 0 refills | Status: DC

## 2017-08-19 MED ORDER — FLUCONAZOLE 150 MG PO TABS: 150.0000 mg | ORAL_TABLET | Freq: Once | ORAL | 0 refills | Status: AC

## 2017-08-19 NOTE — Progress Notes (Signed)
We are sorry that you are not feeling well.  Here is how we plan to help!  Based on what you have shared with me it looks like you have sinusitis.  Sinusitis is inflammation and infection in the sinus cavities of the head.  Based on your presentation I believe you most likely have Acute Bacterial Sinusitis.  This is an infection caused by bacteria and is treated with antibiotics. I have prescribed Doxycycline 100mg  by mouth twice a day for 10 days. Diflucan 150 mg one time prescribed in the event you develop vaginal irritation. For cough, start Benzonatate 100-200 mg up to 3 times daily as needed. For nasal congestion, I am prescribing Levocetirizine 5 mg once daily at bedtime and Atrovent nasal spray, 2 sprays per nares twice daily as needed. If you develop worsening sinus pain, fever or notice severe headache and vision changes, or if symptoms are not better after completion of antibiotic, please schedule an appointment with a health care provider.    Sinus infections are not as easily transmitted as other respiratory infection, however we still recommend that you avoid close contact with loved ones, especially the very young and elderly.  Remember to wash your hands thoroughly throughout the day as this is the number one way to prevent the spread of infection!  If you have a sore or scratchy throat, use a saltwater gargle-  to  teaspoon of salt dissolved in a 4-ounce to 8-ounce glass of warm water.  Gargle the solution for approximately 15-30 seconds and then spit.  It is important not to swallow the solution.  You can also use throat lozenges/cough drops and Chloraseptic spray to help with throat pain or discomfort.  Warm or cold liquids can also be helpful in relieving throat pain.  Home Care:  Only take medications as instructed by your medical team.  Complete the entire course of an antibiotic.  Do not take these medications with alcohol.  A steam or ultrasonic humidifier can help  congestion.  You can place a towel over your head and breathe in the steam from hot water coming from a faucet.  Avoid close contacts especially the very young and the elderly.  Cover your mouth when you cough or sneeze.  Always remember to wash your hands.  Get Help Right Away If:  You develop worsening fever or sinus pain.  You develop a severe head ache or visual changes.  Your symptoms persist after you have completed your treatment plan.  Make sure you  Understand these instructions.  Will watch your condition.  Will get help right away if you are not doing well or get worse.  Your e-visit answers were reviewed by a board certified advanced clinical practitioner to complete your personal care plan.  Depending on the condition, your plan could have included both over the counter or prescription medications.  If there is a problem please reply  once you have received a response from your provider.  Your safety is important to [place].  If you have drug allergies check your prescription carefully.    You can use MyChart to ask questions about today's visit, request a non-urgent call back, or ask for a work or school excuse for 24 hours related to this e-Visit. If it has been greater than 24 hours you will need to follow up with your provider, or enter a new e-Visit to address those concerns.  You will get an e-mail in the next two days asking about your experience.  I hope that your e-visit has been valuable and will speed your recovery. Thank you for using e-visits.  Carroll Sage. Kenton Kingfisher, MSN, FNP-C [place]

## 2017-08-30 ENCOUNTER — Encounter: Payer: Self-pay | Admitting: Cardiology

## 2017-08-30 ENCOUNTER — Ambulatory Visit: Payer: 59 | Admitting: Cardiology

## 2017-08-30 VITALS — BP 112/62 | HR 99 | Ht 63.0 in | Wt 192.2 lb

## 2017-08-30 DIAGNOSIS — R002 Palpitations: Secondary | ICD-10-CM

## 2017-08-30 DIAGNOSIS — E785 Hyperlipidemia, unspecified: Secondary | ICD-10-CM | POA: Diagnosis not present

## 2017-08-30 DIAGNOSIS — R600 Localized edema: Secondary | ICD-10-CM

## 2017-08-30 DIAGNOSIS — I83813 Varicose veins of bilateral lower extremities with pain: Secondary | ICD-10-CM | POA: Diagnosis not present

## 2017-08-30 DIAGNOSIS — I83893 Varicose veins of bilateral lower extremities with other complications: Secondary | ICD-10-CM | POA: Insufficient documentation
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
## 2017-08-30 NOTE — Patient Instructions (Signed)
Medication  Instruction  Try taking Crestor ( rosuvastatin) 10 mg  2 days out for the week  titrate as discussed)    You have been referred to  [place] physician wants you to follow-up in 2 YEARS ( MAY 2021) WITH DR HARDING.You will receive a reminder letter in the mail two months in advance. If you don't receive a letter, please call our office to schedule the follow-up appointment.  If you need a refill on your cardiac medications before your next appointment, please call your pharmacy.

## 2017-09-03 ENCOUNTER — Encounter: Payer: Self-pay | Admitting: Cardiology

## 2017-09-03 NOTE — Assessment & Plan Note (Addendum)
Previously been seen by [facility] (she thinks she may have had vein stripping) with symptoms getting worse and more edema, I will simply refer her back to them-> for reassessment and evaluation.  Recommend support stockings (try above the knee) & foot elevation. Try to avoid diuretic beyond standing dose.

## 2017-09-03 NOTE — Assessment & Plan Note (Signed)
No significant symptoms.  Monitor really did not show any arrhythmias and just rare PACs/PVCs. If symptoms recur, would consider beta-blocker or non-dihydropyridine calcium channel blocker

## 2017-09-03 NOTE — Assessment & Plan Note (Signed)
Okay to take a as needed next dose of Lasix, but would prefer to see his support stockings.  His current back to vascular specialists for venous stasis/varicose veins.

## 2017-09-03 NOTE — Assessment & Plan Note (Signed)
She really was unable to start the Crestor after was stopped by PCP. I asked her to try--taking it twice a week if possible.

## 2017-09-04 ENCOUNTER — Other Ambulatory Visit: Payer: Self-pay

## 2017-09-04 ENCOUNTER — Encounter: Payer: Self-pay | Admitting: Cardiology

## 2017-09-04 DIAGNOSIS — I8393 Asymptomatic varicose veins of bilateral lower extremities: Secondary | ICD-10-CM

## 2017-09-05 NOTE — Telephone Encounter (Signed)
Spoke with patient. She states she will keep appointment with  Dr Donzetta Matters, and doppler . She wants to make sure the test that schedule is the correct test needed. RN will verify and contact patient back

## 2017-09-10 NOTE — Telephone Encounter (Signed)
LEFT MESSAGE - THE DOPPLER FOR VENOUS REFLUX IS APPROPRIATE TESTING FOR OFFICE APPOINTMENT WITH DR Donzetta Matters  ANY QUESTION MAY CALL BACK

## 2017-09-15 ENCOUNTER — Other Ambulatory Visit: Payer: Self-pay | Admitting: Family Medicine

## 2017-09-18 ENCOUNTER — Encounter: Payer: Self-pay | Admitting: Vascular Surgery

## 2017-09-18 ENCOUNTER — Ambulatory Visit: Payer: 59 | Admitting: Vascular Surgery

## 2017-09-18 ENCOUNTER — Other Ambulatory Visit: Payer: Self-pay

## 2017-09-18 VITALS — BP 114/78 | HR 82 | Temp 97.8°F | Resp 16 | Ht 64.0 in | Wt 189.0 lb

## 2017-09-18 DIAGNOSIS — I868 Varicose veins of other specified sites: Secondary | ICD-10-CM

## 2017-09-18 DIAGNOSIS — I83893 Varicose veins of bilateral lower extremities with other complications: Secondary | ICD-10-CM | POA: Diagnosis not present
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
## 2017-10-08 ENCOUNTER — Encounter: Payer: Self-pay | Admitting: Internal Medicine

## 2017-10-08 IMAGING — CR DG CHEST 2V
2 series · 2 of 2 positions shown · non-contrast
Comparison: PA and lateral chest x-ray April 22, 2015

CLINICAL DATA: Onset of right lateral chest pain radiating to the
midchest this morning. Patient reports breathing difficulty.

EXAM:
CHEST  2 VIEW

[w chest lat]
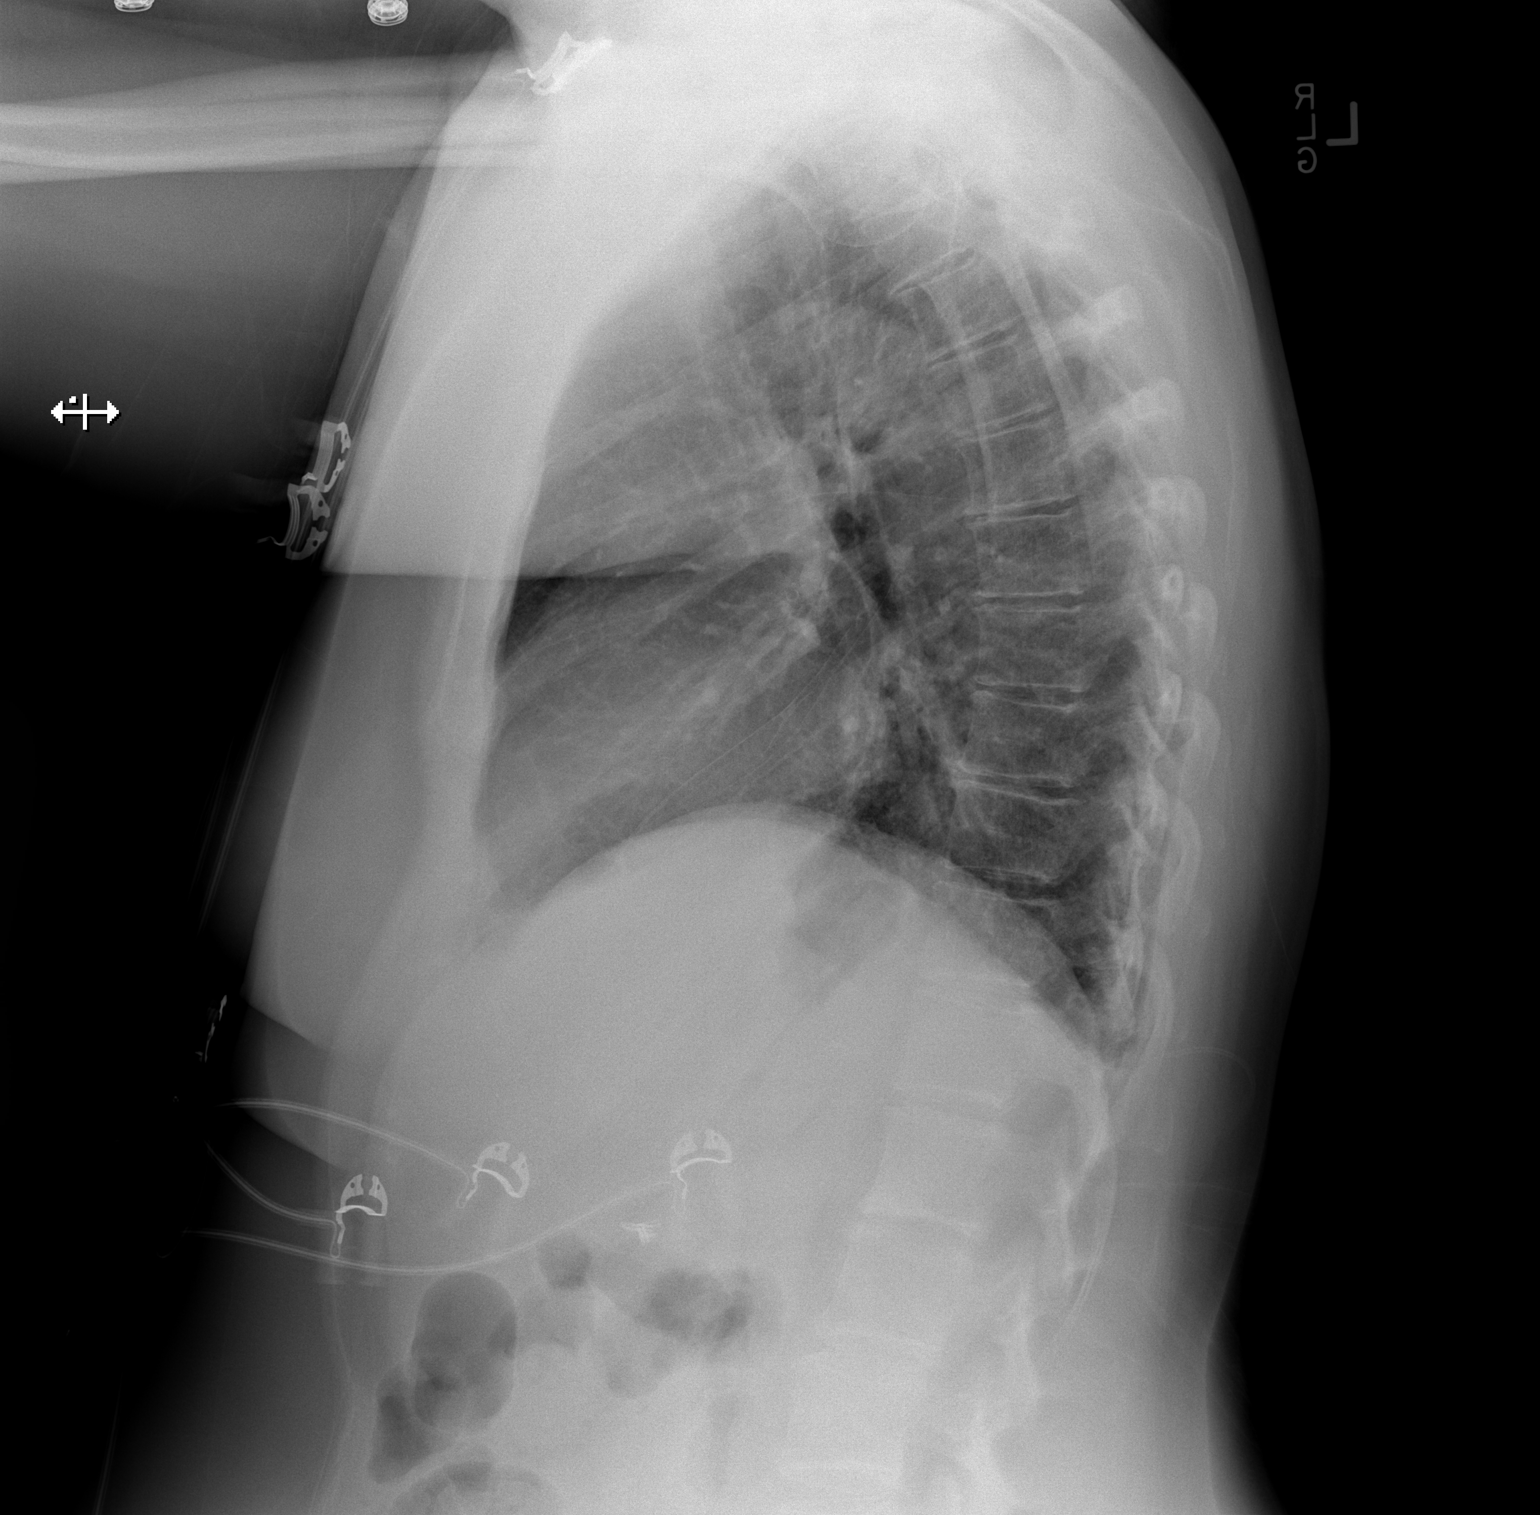

[w chest pa]
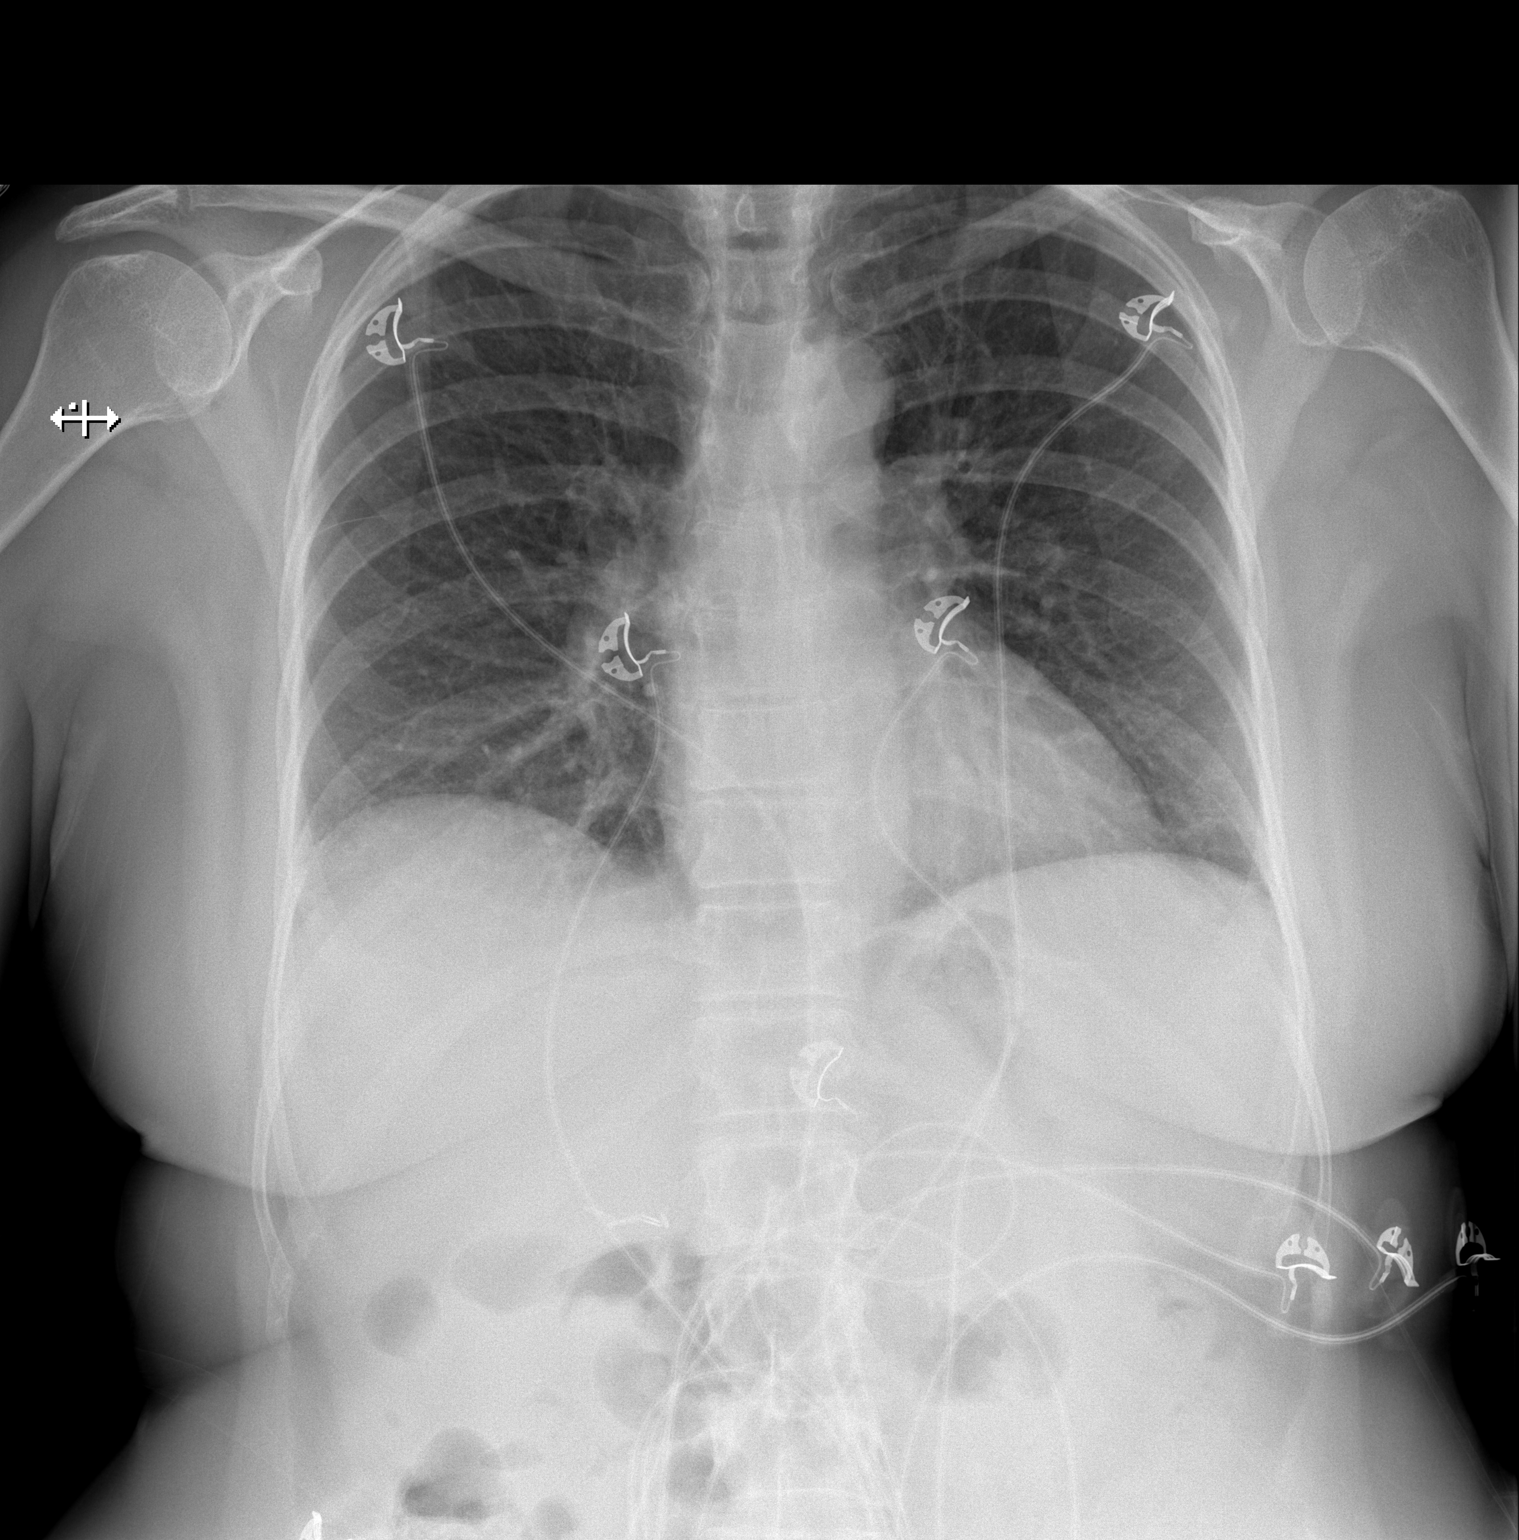

[2 of 2 positions shown; findings below may reference images not displayed]

FINDINGS: The lungs are reasonably well inflated. The interstitial markings
are coarse at both bases. There is no alveolar infiltrate. There is
no pleural effusion. The heart and pulmonary vascularity are normal.
The mediastinum is normal in width. The trachea is midline. The bony
thorax exhibits no acute abnormality.
IMPRESSION: Mild bibasilar subsegmental atelectasis. No alveolar pneumonia nor
CHF.

## 2017-10-08 NOTE — Telephone Encounter (Signed)
Staff to assist pt with ROV acute next available

## 2017-10-11 ENCOUNTER — Ambulatory Visit (INDEPENDENT_AMBULATORY_CARE_PROVIDER_SITE_OTHER)
Admission: RE | Admit: 2017-10-11 | Discharge: 2017-10-11 | Disposition: A | Discharge status: Home / Self Care | Payer: 59 | Source: Ambulatory Visit | Attending: Internal Medicine | Admitting: Internal Medicine

## 2017-10-11 ENCOUNTER — Encounter: Payer: Self-pay | Admitting: Internal Medicine

## 2017-10-11 ENCOUNTER — Other Ambulatory Visit (INDEPENDENT_AMBULATORY_CARE_PROVIDER_SITE_OTHER): Payer: 59

## 2017-10-11 ENCOUNTER — Ambulatory Visit: Payer: 59 | Admitting: Internal Medicine

## 2017-10-11 VITALS — BP 124/86 | HR 96 | Temp 98.3°F | Ht 64.0 in | Wt 192.0 lb

## 2017-10-11 DIAGNOSIS — R079 Chest pain, unspecified: Secondary | ICD-10-CM

## 2017-10-11 DIAGNOSIS — R222 Localized swelling, mass and lump, trunk: Secondary | ICD-10-CM

## 2017-10-11 DIAGNOSIS — R221 Localized swelling, mass and lump, neck: Secondary | ICD-10-CM

## 2017-10-11 DIAGNOSIS — R7302 Impaired glucose tolerance (oral): Secondary | ICD-10-CM

## 2017-10-11 DIAGNOSIS — E039 Hypothyroidism, unspecified: Secondary | ICD-10-CM

## 2017-10-11 LAB — BASIC METABOLIC PANEL
BUN: 16 mg/dL (ref 6–23)
CO2: 28 mEq/L (ref 19–32)
Calcium: 9.5 mg/dL (ref 8.4–10.5)
Chloride: 103 mEq/L (ref 96–112)
Creatinine, Ser: 0.75 mg/dL (ref 0.40–1.20)
GFR: 83.79 mL/min (ref >60.00)
Glucose, Bld: 90 mg/dL (ref 70–99)
Potassium: 4 mEq/L (ref 3.5–5.1)
Sodium: 140 mEq/L (ref 135–145)

## 2017-10-11 LAB — TSH: TSH: 2.98 u[IU]/mL (ref 0.35–4.50)

## 2017-10-11 LAB — HEMOGLOBIN A1C: Hgb A1c MFr Bld: 5.6 % (ref 4.6–6.5)

## 2017-10-11 MED ORDER — ALBUTEROL SULFATE HFA 108 (90 BASE) MCG/ACT IN AERS: 2.0000 | INHALATION_SPRAY | Freq: Four times a day (QID) | RESPIRATORY_TRACT | 11 refills | Status: DC | PRN

## 2017-10-11 MED FILL — VENTOLIN HFA 90 MCG INHALER: 108 (90 BAS | 25 days supply | Qty: 18 | Fill #0

## 2017-10-11 NOTE — Assessment & Plan Note (Signed)
Also for cxr/rib films in light of neck lump and chest pain

## 2017-10-11 NOTE — Assessment & Plan Note (Signed)
?   Significance, mild tender, increasing in size, afeb without ST or URi symptoms, for Ct neck with CM
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
## 2017-10-11 NOTE — Assessment & Plan Note (Signed)
With mild tender subq nodule increasing in size  - for general surgury referral per pt request

## 2017-10-11 NOTE — Assessment & Plan Note (Signed)
stable overall by history and exam, recent data reviewed with pt, and pt to continue medical treatment as before,  to f/u any worsening symptoms or concerns, for f/u tsh with labs

## 2017-10-11 NOTE — Patient Instructions (Signed)
Please continue all other medications as before, and refills have been done if requested - the inhaler  Please have the pharmacy call with any other refills you may need.  Please continue your efforts at being more active, low cholesterol diet, and weight control.  Please keep your appointments with your specialists as you may have planned  You will be contacted regarding the referral for: CT scan for the neck, and General Surgury  Please go to the XRAY Department in the Basement (go straight as you get off the elevator) for the x-ray testing  Please go to the LAB in the Basement (turn left off the elevator) for the tests to be done today  You will be contacted by phone if any changes need to be made immediately.  Otherwise, you will receive a letter about your results with an explanation, but please check with MyChart first.  Please remember to sign up for MyChart if you have not done so, as this will be important to you in the future with finding out test results, communicating by private email, and scheduling acute appointments online when needed.

## 2017-10-11 NOTE — Assessment & Plan Note (Signed)
stable overall by history and exam, recent data reviewed with pt, and pt to continue medical treatment as before,  to f/u any worsening symptoms or concerns Lab Results  Component Value Date   HGBA1C 5.6 10/11/2017

## 2017-10-15 ENCOUNTER — Telehealth: Payer: Self-pay | Admitting: *Deleted

## 2017-10-15 NOTE — Telephone Encounter (Signed)
Left detailed telephone message to follow up regarding her 3 month varicose vein appointment and ultrasound (venous reflux study) scheduled for 12-20-2017 with Dr. Oneida Alar.  Explained that insurance carriers require 3 month trial of conservative treatment and formal venous reflux study before formal treatment plan and pre-certification for venous office procedure can occur.

## 2017-10-24 ENCOUNTER — Ambulatory Visit (INDEPENDENT_AMBULATORY_CARE_PROVIDER_SITE_OTHER)
Admission: RE | Admit: 2017-10-24 | Discharge: 2017-10-24 | Disposition: A | Discharge status: Home / Self Care | Payer: 59 | Source: Ambulatory Visit | Attending: Internal Medicine | Admitting: Internal Medicine

## 2017-10-24 DIAGNOSIS — R221 Localized swelling, mass and lump, neck: Secondary | ICD-10-CM

## 2017-10-24 MED ORDER — IOPAMIDOL (ISOVUE-300) INJECTION 61%
75.0000 mL | Freq: Once | INTRAVENOUS | Status: AC | PRN
  Administered 2017-10-24: 75 mL via INTRAVENOUS

## 2017-11-06 ENCOUNTER — Encounter: Payer: Self-pay | Admitting: Internal Medicine

## 2017-11-06 NOTE — Telephone Encounter (Signed)
It is hard for me to say without knowing the patient - not sure if the pain is from radiculopathy or swelling.  If she can not wait until Dr Jenny Reichmann come back then she should see someone in the office in the next week or so.

## 2017-11-07 ENCOUNTER — Ambulatory Visit: Payer: Self-pay | Admitting: *Deleted

## 2017-11-07 MED ORDER — AMITRIPTYLINE HCL 50 MG PO TABS: 50.0000 mg | ORAL_TABLET | Freq: Every day | ORAL | 1 refills | Status: DC

## 2017-11-07 NOTE — Telephone Encounter (Signed)
Pt called with pain on the top of her feet that sometimes will have spasms and go towards the side of her legs. She has tried a vapor rub to see if that helps but did not. She has used heat and cold, did not help. Her pcp recommended her to see a vein specialist and her appt is on 12/20/17. She denies numbness, tingling, discoloration. She has a little swelling but she up on her feet all day. She will try the muscle relaxer next.  She wants a call back from her provider to see what else she can do until her appt on the 29 th of August.  She was in contact with the office on yesterday and advised to come in if needed. Called patient back and scheduled an appointment with Tia Alert tomorrow. Will route to flow at [facility] to call back for increase in symptoms. Pt voiced understanding.  Reason for Disposition . [1] MODERATE pain (e.g., interferes with normal activities, limping) AND [2] present > 3 days  Answer Assessment - Initial Assessment Questions 1. ONSET: "When did the pain start?"      Over a month 2. LOCATION: "Where is the pain located?"      Top of feet and spasms that go into side of legs 3. PAIN: "How bad is the pain?"    (Scale 1-10; or mild, moderate, severe)   -  MILD (1-3): doesn't interfere with normal activities    -  MODERATE (4-7): interferes with normal activities (e.g., work or school) or awakens from sleep, limping    -  SEVERE (8-10): excruciating pain, unable to do any normal activities, unable to walk     Pain # 6 and goes up at night 4. WORK OR EXERCISE: "Has there been any recent work or exercise that involved this part of the body?"      Working in hospital 5. CAUSE: "What do you think is causing the foot pain?"     Varicose veins 6. OTHER SYMPTOMS: "Do you have any other symptoms?" (e.g., leg pain, rash, fever, numbness)     Leg pain 7. PREGNANCY: "Is there any chance you are pregnant?" "When was your last menstrual period?"     No  periods  Protocols used: FOOT PAIN-A-AH

## 2017-11-08 ENCOUNTER — Encounter: Payer: Self-pay | Admitting: Family Medicine

## 2017-11-08 ENCOUNTER — Ambulatory Visit: Payer: 59 | Admitting: Family Medicine

## 2017-11-08 ENCOUNTER — Ambulatory Visit (INDEPENDENT_AMBULATORY_CARE_PROVIDER_SITE_OTHER)
Admission: RE | Admit: 2017-11-08 | Discharge: 2017-11-08 | Disposition: A | Discharge status: Home / Self Care | Payer: 59 | Source: Ambulatory Visit | Attending: Family Medicine | Admitting: Family Medicine

## 2017-11-08 ENCOUNTER — Other Ambulatory Visit: Payer: 59

## 2017-11-08 ENCOUNTER — Telehealth: Payer: Self-pay | Admitting: Internal Medicine

## 2017-11-08 VITALS — BP 136/72 | HR 86 | Ht 64.0 in | Wt 193.0 lb

## 2017-11-08 DIAGNOSIS — M79672 Pain in left foot: Secondary | ICD-10-CM

## 2017-11-08 DIAGNOSIS — M79671 Pain in right foot: Secondary | ICD-10-CM

## 2017-11-08 MED FILL — AMITRIPTYLINE HCL 50 MG TAB: 50 | 90 days supply | Qty: 90 | Fill #0

## 2017-11-08 NOTE — Patient Instructions (Signed)
Nice to meet you  I will call you with the results.  Based on the results, I'll determine what to do next.
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
## 2017-11-08 NOTE — Assessment & Plan Note (Signed)
Pain possible for neuropathy with history of thyroid, low ferritin, or arthritis related  - iron and ferritin  - xrays  - may try gabapentin or supplementing iron if needed

## 2017-11-08 NOTE — Telephone Encounter (Signed)
Copied from [place] [id number]. Topic: Quick Communication - See Telephone Encounter >> Nov 08, 2017  3:06 PM Bea Graff, NT wrote: CRM for notification. See Telephone encounter for: 11/08/17. Pt calling and states that she will take the Ambien and not take the tylenol and ibprophen until the results of her test come back.

## 2017-11-09 ENCOUNTER — Encounter: Payer: 59 | Admitting: Vascular Surgery

## 2017-11-09 ENCOUNTER — Encounter: Payer: Self-pay | Admitting: Family Medicine

## 2017-11-09 ENCOUNTER — Encounter (HOSPITAL_COMMUNITY): Payer: 59

## 2017-11-09 DIAGNOSIS — R0789 Other chest pain: Secondary | ICD-10-CM | POA: Diagnosis not present

## 2017-11-09 LAB — IRON,TIBC AND FERRITIN PANEL
%SAT: 22 % (calc) (ref 16–45)
Ferritin: 30 ng/mL (ref 16–232)
Iron: 78 ug/dL (ref 45–160)
TIBC: 354 mcg/dL (calc) (ref 250–450)

## 2017-11-09 MED ORDER — GABAPENTIN 100 MG PO CAPS: 100.0000 mg | ORAL_CAPSULE | Freq: Three times a day (TID) | ORAL | 3 refills | Status: DC

## 2017-11-09 NOTE — Telephone Encounter (Signed)
Spoke with patient about her results. Will try gabapentin. If no improvement can consider supplementing with oral iron to see if symptoms improve.   Rosemarie Ax, MD [facility] 11/09/2017, 5:11 PM

## 2017-11-12 MED FILL — LEVOTHYROXINE 112 MCG TAB: 112 | 90 days supply | Qty: 90 | Fill #2

## 2017-11-12 MED FILL — PANTOPRAZOLE SOD DR 40 MG T: 40 | 90 days supply | Qty: 90 | Fill #2

## 2017-12-20 ENCOUNTER — Other Ambulatory Visit: Payer: Self-pay

## 2017-12-20 ENCOUNTER — Ambulatory Visit (HOSPITAL_COMMUNITY)
Admission: RE | Admit: 2017-12-20 | Discharge: 2017-12-20 | Disposition: A | Discharge status: Home / Self Care | Payer: 59 | Source: Ambulatory Visit | Attending: Vascular Surgery | Admitting: Vascular Surgery

## 2017-12-20 ENCOUNTER — Encounter: Payer: Self-pay | Admitting: Vascular Surgery

## 2017-12-20 ENCOUNTER — Ambulatory Visit: Payer: 59 | Admitting: Vascular Surgery

## 2017-12-20 VITALS — BP 141/92 | HR 79 | Temp 97.4°F | Resp 16 | Ht 64.0 in | Wt 190.0 lb

## 2017-12-20 DIAGNOSIS — I8393 Asymptomatic varicose veins of bilateral lower extremities: Secondary | ICD-10-CM | POA: Diagnosis not present

## 2017-12-20 DIAGNOSIS — R609 Edema, unspecified: Secondary | ICD-10-CM | POA: Diagnosis not present

## 2017-12-20 DIAGNOSIS — I83813 Varicose veins of bilateral lower extremities with pain: Secondary | ICD-10-CM
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
## 2017-12-28 ENCOUNTER — Encounter: Payer: Self-pay | Admitting: Internal Medicine

## 2017-12-28 MED ORDER — ROSUVASTATIN CALCIUM 10 MG PO TABS: 10.0000 mg | ORAL_TABLET | Freq: Every day | ORAL | 1 refills | Status: DC

## 2018-01-15 DIAGNOSIS — M4316 Spondylolisthesis, lumbar region: Secondary | ICD-10-CM | POA: Diagnosis not present

## 2018-01-15 DIAGNOSIS — R030 Elevated blood-pressure reading, without diagnosis of hypertension: Secondary | ICD-10-CM | POA: Diagnosis not present

## 2018-01-15 DIAGNOSIS — M47816 Spondylosis without myelopathy or radiculopathy, lumbar region: Secondary | ICD-10-CM | POA: Diagnosis not present

## 2018-01-15 DIAGNOSIS — Z6833 Body mass index (BMI) 33.0-33.9, adult: Secondary | ICD-10-CM | POA: Diagnosis not present

## 2018-02-04 ENCOUNTER — Encounter (HOSPITAL_COMMUNITY): Payer: Self-pay | Admitting: Family Medicine

## 2018-02-04 ENCOUNTER — Ambulatory Visit: Payer: Self-pay

## 2018-02-04 ENCOUNTER — Ambulatory Visit (INDEPENDENT_AMBULATORY_CARE_PROVIDER_SITE_OTHER): Payer: 59

## 2018-02-04 ENCOUNTER — Ambulatory Visit (HOSPITAL_COMMUNITY)
Admission: EM | Admit: 2018-02-04 | Discharge: 2018-02-04 | Disposition: A | Discharge status: Home / Self Care | Payer: 59 | Attending: Family Medicine | Admitting: Family Medicine

## 2018-02-04 ENCOUNTER — Other Ambulatory Visit: Payer: Self-pay

## 2018-02-04 DIAGNOSIS — R531 Weakness: Secondary | ICD-10-CM

## 2018-02-04 DIAGNOSIS — R072 Precordial pain: Secondary | ICD-10-CM | POA: Diagnosis not present

## 2018-02-04 DIAGNOSIS — R0789 Other chest pain: Secondary | ICD-10-CM

## 2018-02-04 DIAGNOSIS — R5383 Other fatigue: Secondary | ICD-10-CM

## 2018-02-04 NOTE — Telephone Encounter (Signed)
Pt. Reports she had chest pain Friday night ( 3 days ago) that lasted 2 hours. Middle of chest, right sided neck pain, had sweating and dizziness. Was extremely tired the rest of the weekend. No further chest pain. States she is "still really tired." " It scared me." No availability in office today. Pt. Will go to [facility] for evaluation. Instructed if she has chest pain again to go to the ED.  Reason for Disposition . [1] Chest pain lasts > 5 minutes AND [2] occurred > 3 days ago (72 hours) AND [3] NO chest pain or cardiac symptoms now  Answer Assessment - Initial Assessment Questions 1. LOCATION: "Where does it hurt?"       Middle of chest 2. RADIATION: "Does the pain go anywhere else?" (e.g., into neck, jaw, arms, back)     Right side of neck 3. ONSET: "When did the chest pain begin?" (Minutes, hours or days)      Friday night  4. PATTERN "Does the pain come and go, or has it been constant since it started?"  "Does it get worse with exertion?"      Lasted 2 hours 5. DURATION: "How long does it last" (e.g., seconds, minutes, hours)     Lasted 2 hours 6. SEVERITY: "How bad is the pain?"  (e.g., Scale 1-10; mild, moderate, or severe)    - MILD (1-3): doesn't interfere with normal activities     - MODERATE (4-7): interferes with normal activities or awakens from sleep    - SEVERE (8-10): excruciating pain, unable to do any normal activities       8 7. CARDIAC RISK FACTORS: "Do you have any history of heart problems or risk factors for heart disease?" (e.g., prior heart attack, angina; high blood pressure, diabetes, being overweight, high cholesterol, smoking, or strong family history of heart disease)     Tachycardia 8. PULMONARY RISK FACTORS: "Do you have any history of lung disease?"  (e.g., blood clots in lung, asthma, emphysema, birth control pills)     Asthma 9. CAUSE: "What do you think is causing the chest pain?"     Unsure 10. OTHER SYMPTOMS: "Do you have any other symptoms?" (e.g.,  dizziness, nausea, vomiting, sweating, fever, difficulty breathing, cough)       Sweating and dizziness 11. PREGNANCY: "Is there any chance you are pregnant?" "When was your last menstrual period?"       No  Protocols used: CHEST PAIN-A-AH

## 2018-02-04 NOTE — Discharge Instructions (Addendum)
It is impossible to say exactly what caused this pain in the subsequent fatigue.  Nevertheless I think it safe to go back to work and expect the fatigue to clear on its own in the next couple days.   The electrocardiogram results are normal.    CLINICAL DATA:  2 hour episode of abrupt onset sub sternal chest pain three days ago. No pain since. Former smoker. History of coronary artery disease.   EXAM: CHEST - 2 VIEW   COMPARISON:  Chest x-ray of October 11, 2017   FINDINGS: The lungs are adequately inflated and clear. The heart and pulmonary vascularity are normal. The mediastinum is normal in width. The trachea is midline. The bony thorax exhibits no acute abnormality.   IMPRESSION: There is no active cardiopulmonary disease.     Electronically Signed   By: David  Martinique M.D.   On: 02/04/2018 15:28

## 2018-02-04 NOTE — ED Triage Notes (Signed)
Pt reports mid chest pain on Friday night with associated with sweating and nausea.  The pain lasted about two hours.  Pt has been tired ever since the episode.  She called her PCP and they told her to go to UC.
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
## 2018-02-22 MED FILL — PANTOPRAZOLE SOD DR 40 MG T: 40 | 90 days supply | Qty: 90 | Fill #3

## 2018-02-22 MED FILL — LEVOTHYROXINE 112 MCG TAB: 112 | 90 days supply | Qty: 90 | Fill #3

## 2018-02-26 DIAGNOSIS — Z6833 Body mass index (BMI) 33.0-33.9, adult: Secondary | ICD-10-CM | POA: Diagnosis not present

## 2018-02-26 DIAGNOSIS — M4316 Spondylolisthesis, lumbar region: Secondary | ICD-10-CM | POA: Diagnosis not present

## 2018-02-26 DIAGNOSIS — I10 Essential (primary) hypertension: Secondary | ICD-10-CM | POA: Diagnosis not present

## 2018-02-26 DIAGNOSIS — M47816 Spondylosis without myelopathy or radiculopathy, lumbar region: Secondary | ICD-10-CM | POA: Diagnosis not present

## 2018-03-22 ENCOUNTER — Encounter (HOSPITAL_COMMUNITY): Payer: Self-pay | Admitting: Emergency Medicine

## 2018-03-22 ENCOUNTER — Ambulatory Visit (INDEPENDENT_AMBULATORY_CARE_PROVIDER_SITE_OTHER): Payer: 59

## 2018-03-22 ENCOUNTER — Ambulatory Visit (HOSPITAL_COMMUNITY)
Admission: EM | Admit: 2018-03-22 | Discharge: 2018-03-22 | Disposition: A | Discharge status: Home / Self Care | Payer: 59 | Attending: Family Medicine | Admitting: Family Medicine

## 2018-03-22 DIAGNOSIS — M1990 Unspecified osteoarthritis, unspecified site: Secondary | ICD-10-CM | POA: Diagnosis not present

## 2018-03-22 DIAGNOSIS — E876 Hypokalemia: Secondary | ICD-10-CM | POA: Diagnosis not present

## 2018-03-22 DIAGNOSIS — Z882 Allergy status to sulfonamides status: Secondary | ICD-10-CM | POA: Diagnosis not present

## 2018-03-22 DIAGNOSIS — R05 Cough: Secondary | ICD-10-CM | POA: Diagnosis not present

## 2018-03-22 DIAGNOSIS — Z9889 Other specified postprocedural states: Secondary | ICD-10-CM | POA: Insufficient documentation

## 2018-03-22 DIAGNOSIS — J40 Bronchitis, not specified as acute or chronic: Secondary | ICD-10-CM | POA: Insufficient documentation

## 2018-03-22 DIAGNOSIS — Z79890 Hormone replacement therapy (postmenopausal): Secondary | ICD-10-CM | POA: Diagnosis not present

## 2018-03-22 DIAGNOSIS — F411 Generalized anxiety disorder: Secondary | ICD-10-CM | POA: Diagnosis not present

## 2018-03-22 DIAGNOSIS — K219 Gastro-esophageal reflux disease without esophagitis: Secondary | ICD-10-CM | POA: Diagnosis not present

## 2018-03-22 DIAGNOSIS — Z87891 Personal history of nicotine dependence: Secondary | ICD-10-CM | POA: Insufficient documentation

## 2018-03-22 DIAGNOSIS — Z880 Allergy status to penicillin: Secondary | ICD-10-CM | POA: Insufficient documentation

## 2018-03-22 DIAGNOSIS — Z886 Allergy status to analgesic agent status: Secondary | ICD-10-CM | POA: Diagnosis not present

## 2018-03-22 DIAGNOSIS — E039 Hypothyroidism, unspecified: Secondary | ICD-10-CM | POA: Diagnosis not present

## 2018-03-22 DIAGNOSIS — E785 Hyperlipidemia, unspecified: Secondary | ICD-10-CM | POA: Diagnosis not present

## 2018-03-22 DIAGNOSIS — Z9049 Acquired absence of other specified parts of digestive tract: Secondary | ICD-10-CM | POA: Insufficient documentation

## 2018-03-22 DIAGNOSIS — R0602 Shortness of breath: Secondary | ICD-10-CM | POA: Diagnosis not present

## 2018-03-22 DIAGNOSIS — Z79899 Other long term (current) drug therapy: Secondary | ICD-10-CM | POA: Insufficient documentation

## 2018-03-22 DIAGNOSIS — E669 Obesity, unspecified: Secondary | ICD-10-CM | POA: Diagnosis not present

## 2018-03-22 LAB — POCT RAPID STREP A: Streptococcus, Group A Screen (Direct): NEGATIVE

## 2018-03-22 MED ORDER — PREDNISONE 10 MG PO TABS: 20.0000 mg | ORAL_TABLET | Freq: Every day | ORAL | 0 refills | Status: AC
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
## 2018-03-22 NOTE — ED Triage Notes (Signed)
Here for cold like sx onset 2 days associated w/cough, SOB, nasal congestion and chest tightness  Denies fevers, vomiting, diarrhea  A&O x4... NAD... Ambulatory

## 2018-03-22 NOTE — Discharge Instructions (Signed)
Your x-ray did not reveal pneumonia. There was some  bronchial thickening. We will go ahead and treat you for bronchitis Mucinex daily Prednisone daily for 5 days Albuterol inhaler as needed for cough, wheezing, shortness of breath Ibuprofen or Tylenol as needed for pain, fevers You  can try salt water gargles and Chloraseptic spray for sore throat. Rapid strep was also negative Follow-up with your doctor if not better by Monday

## 2018-03-24 ENCOUNTER — Ambulatory Visit (INDEPENDENT_AMBULATORY_CARE_PROVIDER_SITE_OTHER): Payer: Self-pay | Admitting: Family Medicine

## 2018-03-24 DIAGNOSIS — R05 Cough: Secondary | ICD-10-CM

## 2018-03-24 DIAGNOSIS — R059 Cough, unspecified: Secondary | ICD-10-CM

## 2018-03-24 DIAGNOSIS — J329 Chronic sinusitis, unspecified: Secondary | ICD-10-CM

## 2018-03-24 LAB — CULTURE, GROUP A STREP (THRC)

## 2018-03-24 MED ORDER — BENZONATATE 200 MG PO CAPS: 200.0000 mg | ORAL_CAPSULE | Freq: Three times a day (TID) | ORAL | 0 refills | Status: DC | PRN

## 2018-03-24 MED ORDER — AZITHROMYCIN 500 MG PO TABS: 500.0000 mg | ORAL_TABLET | Freq: Every day | ORAL | 0 refills | Status: AC

## 2018-03-24 NOTE — Progress Notes (Signed)
[name] is a 62 y.o. female who presents today with concerns of cough and congestion for the last week. Of note patient's primary concern is upcoming back ablation and ability to proceed with surgery if unwell. She reports that total time of symptoms have been for 7 days today. She reports feelings of fever and chills but is afebrile at this time. She was seen in Urgent care in the last 48 hours and diagnosed with bronchitis and provided prednisone. She reports that this has mildly improved her symptoms but cough, and congestion continues.  Review of Systems  Constitutional: Positive for fever. Negative for chills and malaise/fatigue.  HENT: Positive for congestion, sinus pain and sore throat. Negative for ear discharge and ear pain.   Eyes: Negative.   Respiratory: Positive for cough. Negative for sputum production and shortness of breath.   Cardiovascular: Negative.  Negative for chest pain.  Gastrointestinal: Negative for abdominal pain, diarrhea, nausea and vomiting.  Genitourinary: Negative for dysuria, frequency, hematuria and urgency.  Musculoskeletal: Negative for myalgias.  Skin: Negative.   Neurological: Negative for headaches.  Endo/Heme/Allergies: Negative.   Psychiatric/Behavioral: Negative.     O: Vitals:   03/24/18 1429  BP: 115/65  Pulse: 82  Resp: 18  Temp: (!) 97.4 F (36.3 C)  SpO2: 98%     Physical Exam  Constitutional: Vital signs are normal. She appears well-developed and well-nourished. She is active.  Non-toxic appearance. She appears ill.  HENT:  Head: Normocephalic.  Right Ear: Hearing, external ear and ear canal normal. A middle ear effusion is present.  Left Ear: Hearing, external ear and ear canal normal. A middle ear effusion is present.  Nose: Rhinorrhea present. Right sinus exhibits frontal sinus tenderness. Right sinus exhibits no maxillary sinus tenderness. Left sinus exhibits frontal sinus tenderness. Left sinus exhibits no maxillary  sinus tenderness.  Mouth/Throat: Posterior oropharyngeal erythema present.  Eyes: Pupils are equal, round, and reactive to light.  Neck: Normal range of motion.  Cardiovascular: Normal rate.  Pulmonary/Chest: Effort normal. She has rhonchi in the right upper field, the right middle field, the right lower field, the left middle field and the left lower field.  Abdominal: Soft.  Musculoskeletal: Normal range of motion.  Lymphadenopathy:       Head (right side): Tonsillar adenopathy present. No submental and no submandibular adenopathy present.       Head (left side): Tonsillar adenopathy present. No submental and no submandibular adenopathy present.    She has cervical adenopathy.  Neurological: She has normal strength.  Skin: Skin is warm.  Vitals reviewed.   A: 1. Sinusitis, unspecified chronicity, unspecified location   2. Cough     P: Discussed exam findings, diagnosis etiology and medication use and indications reviewed with patient. Follow- Up and discharge instructions provided. No emergent/urgent issues found on exam.  Patient verbalized understanding of information provided and agrees with plan of care (POC), all questions answered.  1. Sinusitis, unspecified chronicity, unspecified location - azithromycin (ZITHROMAX) 500 MG tablet; Take 1 tablet (500 mg total) by mouth daily for 3 days.  2. Cough - azithromycin (ZITHROMAX) 500 MG tablet; Take 1 tablet (500 mg total) by mouth daily for 3 days. - benzonatate (TESSALON) 200 MG capsule; Take 1 capsule (200 mg total) by mouth 3 (three) times daily as needed for cough.  Other orders - ALPRAZolam (XANAX) 0.25 MG tablet; alprazolam 0.25 mg tablet

## 2018-03-24 NOTE — Patient Instructions (Signed)

## 2018-03-28 DIAGNOSIS — Z87442 Personal history of urinary calculi: Secondary | ICD-10-CM | POA: Diagnosis not present

## 2018-03-28 DIAGNOSIS — Z87440 Personal history of urinary (tract) infections: Secondary | ICD-10-CM | POA: Diagnosis not present

## 2018-03-28 DIAGNOSIS — N281 Cyst of kidney, acquired: Secondary | ICD-10-CM | POA: Diagnosis not present

## 2018-03-29 DIAGNOSIS — M47816 Spondylosis without myelopathy or radiculopathy, lumbar region: Secondary | ICD-10-CM | POA: Diagnosis not present

## 2018-04-02 ENCOUNTER — Encounter: Payer: Self-pay | Admitting: Internal Medicine

## 2018-04-05 MED FILL — AMITRIPTYLINE HCL 50 MG TAB: 50 | 90 days supply | Qty: 90 | Fill #1

## 2018-04-12 MED FILL — VENTOLIN HFA 90 MCG INHALER: 108 (90 BAS | 25 days supply | Qty: 18 | Fill #1

## 2018-04-18 ENCOUNTER — Other Ambulatory Visit (INDEPENDENT_AMBULATORY_CARE_PROVIDER_SITE_OTHER): Payer: 59

## 2018-04-18 DIAGNOSIS — Z114 Encounter for screening for human immunodeficiency virus [HIV]: Secondary | ICD-10-CM

## 2018-04-18 DIAGNOSIS — R7302 Impaired glucose tolerance (oral): Secondary | ICD-10-CM | POA: Diagnosis not present

## 2018-04-18 DIAGNOSIS — E785 Hyperlipidemia, unspecified: Secondary | ICD-10-CM | POA: Diagnosis not present

## 2018-04-18 LAB — HEPATIC FUNCTION PANEL
ALT: 16 U/L (ref 0–35)
AST: 14 U/L (ref 0–37)
Albumin: 3.9 g/dL (ref 3.5–5.2)
Alkaline Phosphatase: 92 U/L (ref 39–117)
Bilirubin, Direct: 0 mg/dL (ref 0.0–0.3)
Total Bilirubin: 0.4 mg/dL (ref 0.2–1.2)
Total Protein: 6.8 g/dL (ref 6.0–8.3)

## 2018-04-18 LAB — URINALYSIS, ROUTINE W REFLEX MICROSCOPIC
Bilirubin Urine: NEGATIVE
Hgb urine dipstick: NEGATIVE
Ketones, ur: NEGATIVE
Leukocytes, UA: NEGATIVE
Nitrite: NEGATIVE
Specific Gravity, Urine: <=1.005 — AB (ref 1.000–1.030)
Total Protein, Urine: NEGATIVE
Urine Glucose: NEGATIVE
Urobilinogen, UA: 0.2 (ref 0.0–1.0)
pH: 6.5 (ref 5.0–8.0)

## 2018-04-18 LAB — LIPID PANEL
Cholesterol: 146 mg/dL (ref 0–200)
HDL: 55.1 mg/dL (ref >39.00)
LDL Cholesterol: 77 mg/dL (ref 0–99)
NonHDL: 91.31
Total CHOL/HDL Ratio: 3
Triglycerides: 70 mg/dL (ref 0.0–149.0)
VLDL: 14 mg/dL (ref 0.0–40.0)

## 2018-04-18 LAB — CBC WITH DIFFERENTIAL/PLATELET
Basophils Absolute: 0 10*3/uL (ref 0.0–0.1)
Basophils Relative: 0.8 % (ref 0.0–3.0)
Eosinophils Absolute: 0.3 10*3/uL (ref 0.0–0.7)
Eosinophils Relative: 5.7 % — ABNORMAL HIGH (ref 0.0–5.0)
HCT: 41.4 % (ref 36.0–46.0)
Hemoglobin: 14 g/dL (ref 12.0–15.0)
Lymphocytes Relative: 26.9 % (ref 12.0–46.0)
Lymphs Abs: 1.5 10*3/uL (ref 0.7–4.0)
MCHC: 33.9 g/dL (ref 30.0–36.0)
MCV: 94 fl (ref 78.0–100.0)
Monocytes Absolute: 0.4 10*3/uL (ref 0.1–1.0)
Monocytes Relative: 7.1 % (ref 3.0–12.0)
Neutro Abs: 3.3 10*3/uL (ref 1.4–7.7)
Neutrophils Relative %: 59.5 % (ref 43.0–77.0)
Platelets: 237 10*3/uL (ref 150.0–400.0)
RBC: 4.4 Mil/uL (ref 3.87–5.11)
RDW: 13.9 % (ref 11.5–15.5)
WBC: 5.5 10*3/uL (ref 4.0–10.5)

## 2018-04-18 LAB — BASIC METABOLIC PANEL
BUN: 11 mg/dL (ref 6–23)
CO2: 28 mEq/L (ref 19–32)
Calcium: 9.2 mg/dL (ref 8.4–10.5)
Chloride: 106 mEq/L (ref 96–112)
Creatinine, Ser: 0.8 mg/dL (ref 0.40–1.20)
GFR: 77.64 mL/min (ref >60.00)
Glucose, Bld: 66 mg/dL — ABNORMAL LOW (ref 70–99)
Potassium: 3.6 mEq/L (ref 3.5–5.1)
Sodium: 141 mEq/L (ref 135–145)

## 2018-04-18 LAB — HEMOGLOBIN A1C: Hgb A1c MFr Bld: 5.4 % (ref 4.6–6.5)

## 2018-04-18 LAB — TSH: TSH: 10.46 u[IU]/mL — ABNORMAL HIGH (ref 0.35–4.50)

## 2018-04-19 ENCOUNTER — Telehealth: Payer: Self-pay

## 2018-04-19 NOTE — Telephone Encounter (Signed)
-----   Message from Biagio Borg, MD sent at 04/19/2018 11:19 AM EST ----- Left message on MyChart, pt to cont same tx except  The test results show that your current treatment is OK, except the TSH is elevated (meaning you have too low thyroid function).  Assuming you are taking the medication as prescribed, we should increase the dose from 112 mcg per day to 125.  I will send the new prescription and you should hear from the office as well.    [name] to please inform pt, I will do rx;  Also to let pt know, the HIV may have been ordered inadvertently as this is a routine screening test we are required to address with each patient

## 2018-04-19 NOTE — Telephone Encounter (Signed)
Pt has viewed results via MyChart  

## 2018-04-25 ENCOUNTER — Encounter: Payer: Self-pay | Admitting: Internal Medicine

## 2018-04-25 MED ORDER — LEVOTHYROXINE SODIUM 125 MCG PO TABS: 125.0000 ug | ORAL_TABLET | Freq: Every day | ORAL | 3 refills | Status: DC

## 2018-04-25 MED FILL — LEVOTHYROXINE 125 MCG TABLE: 125 | 90 days supply | Qty: 90 | Fill #0

## 2018-04-26 ENCOUNTER — Other Ambulatory Visit: Payer: Self-pay | Admitting: Obstetrics and Gynecology

## 2018-04-26 ENCOUNTER — Ambulatory Visit (INDEPENDENT_AMBULATORY_CARE_PROVIDER_SITE_OTHER): Payer: 59 | Admitting: Internal Medicine

## 2018-04-26 ENCOUNTER — Encounter: Payer: Self-pay | Admitting: Internal Medicine

## 2018-04-26 VITALS — BP 106/62 | HR 87 | Temp 98.0°F | Wt 193.0 lb

## 2018-04-26 DIAGNOSIS — Z23 Encounter for immunization: Secondary | ICD-10-CM | POA: Diagnosis not present

## 2018-04-26 DIAGNOSIS — E039 Hypothyroidism, unspecified: Secondary | ICD-10-CM

## 2018-04-26 DIAGNOSIS — J309 Allergic rhinitis, unspecified: Secondary | ICD-10-CM

## 2018-04-26 DIAGNOSIS — Z0000 Encounter for general adult medical examination without abnormal findings: Secondary | ICD-10-CM | POA: Diagnosis not present

## 2018-04-26 DIAGNOSIS — Z1231 Encounter for screening mammogram for malignant neoplasm of breast: Secondary | ICD-10-CM

## 2018-04-26 DIAGNOSIS — R7302 Impaired glucose tolerance (oral): Secondary | ICD-10-CM | POA: Diagnosis not present

## 2018-04-26 MED ORDER — METHYLPREDNISOLONE ACETATE 80 MG/ML IJ SUSP
80.0000 mg | Freq: Once | INTRAMUSCULAR | Status: AC
  Administered 2018-04-26: 80 mg via INTRAMUSCULAR

## 2018-04-26 MED ORDER — TRIAMCINOLONE ACETONIDE 55 MCG/ACT NA AERO: 2.0000 | INHALATION_SPRAY | Freq: Every day | NASAL | 3 refills | Status: DC

## 2018-04-26 NOTE — Patient Instructions (Addendum)
You had the Tetanus (Tdap) shot today  You had the steroid shot today  Please take all new medication as prescribed  - the nasacort as directed  Please continue all other medications as before, and refills have been done if requested.  Please have the pharmacy call with any other refills you may need.  Please continue your efforts at being more active, low cholesterol diet, and weight control.  You are otherwise up to date with prevention measures today.  Please keep your appointments with your specialists as you may have planned  Please go to the LAB only in 4 wks for thyroid test recheck  You will be contacted by phone if any changes need to be made immediately.  Otherwise, you will receive a letter about your results with an explanation, but please check with MyChart first.  Please remember to sign up for MyChart if you have not done so, as this will be important to you in the future with finding out test results, communicating by private email, and scheduling acute appointments online when needed.  Please return in 6 months, or sooner if needed

## 2018-04-26 NOTE — Assessment & Plan Note (Signed)
Cont same tx, f/u lab in 4 wks

## 2018-04-26 NOTE — Assessment & Plan Note (Signed)
stable overall by history and exam, recent data reviewed with pt, and pt to continue medical treatment as before,  to f/u any worsening symptoms or concerns  

## 2018-04-26 NOTE — Assessment & Plan Note (Signed)
Mild uncontrolled, for depomedrol IM 80, and nasacort asd, cont claritin asd,  to f/u any worsening symptoms or concerns

## 2018-04-26 NOTE — Assessment & Plan Note (Signed)
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
## 2018-05-08 DIAGNOSIS — Z01419 Encounter for gynecological examination (general) (routine) without abnormal findings: Secondary | ICD-10-CM | POA: Diagnosis not present

## 2018-05-08 DIAGNOSIS — Z13820 Encounter for screening for osteoporosis: Secondary | ICD-10-CM | POA: Diagnosis not present

## 2018-05-08 DIAGNOSIS — R5383 Other fatigue: Secondary | ICD-10-CM | POA: Diagnosis not present

## 2018-05-08 DIAGNOSIS — Z6833 Body mass index (BMI) 33.0-33.9, adult: Secondary | ICD-10-CM | POA: Diagnosis not present

## 2018-05-09 MED FILL — VIT D2 1.25 MG (50,000 UNIT: 1.25 MG | 56 days supply | Qty: 8 | Fill #0

## 2018-05-14 ENCOUNTER — Other Ambulatory Visit (INDEPENDENT_AMBULATORY_CARE_PROVIDER_SITE_OTHER): Payer: 59

## 2018-05-14 DIAGNOSIS — E039 Hypothyroidism, unspecified: Secondary | ICD-10-CM | POA: Diagnosis not present

## 2018-05-14 LAB — TSH: TSH: 0.44 u[IU]/mL (ref 0.35–4.50)

## 2018-05-14 LAB — T4, FREE: Free T4: 1.23 ng/dL (ref 0.60–1.60)

## 2018-05-25 HISTORY — PX: BREAST BIOPSY: SHX20

## 2018-05-27 ENCOUNTER — Ambulatory Visit
Admission: RE | Admit: 2018-05-27 | Discharge: 2018-05-27 | Disposition: A | Discharge status: Home / Self Care | Payer: 59 | Source: Ambulatory Visit | Attending: Obstetrics and Gynecology | Admitting: Obstetrics and Gynecology

## 2018-05-27 DIAGNOSIS — Z1231 Encounter for screening mammogram for malignant neoplasm of breast: Secondary | ICD-10-CM | POA: Diagnosis not present

## 2018-05-28 ENCOUNTER — Other Ambulatory Visit: Payer: Self-pay | Admitting: Internal Medicine

## 2018-05-28 MED FILL — PANTOPRAZOLE SOD DR 40 MG T: 40 | 90 days supply | Qty: 90 | Fill #0

## 2018-05-29 ENCOUNTER — Other Ambulatory Visit: Payer: Self-pay | Admitting: Obstetrics and Gynecology

## 2018-05-29 ENCOUNTER — Encounter: Payer: Self-pay | Admitting: Internal Medicine

## 2018-05-29 DIAGNOSIS — R928 Other abnormal and inconclusive findings on diagnostic imaging of breast: Secondary | ICD-10-CM

## 2018-05-30 ENCOUNTER — Other Ambulatory Visit: Payer: 59

## 2018-05-31 ENCOUNTER — Ambulatory Visit
Admission: RE | Admit: 2018-05-31 | Discharge: 2018-05-31 | Disposition: A | Discharge status: Home / Self Care | Payer: 59 | Source: Ambulatory Visit | Attending: Obstetrics and Gynecology | Admitting: Obstetrics and Gynecology

## 2018-05-31 ENCOUNTER — Ambulatory Visit: Payer: 59

## 2018-05-31 ENCOUNTER — Other Ambulatory Visit: Payer: Self-pay | Admitting: Obstetrics and Gynecology

## 2018-05-31 DIAGNOSIS — R921 Mammographic calcification found on diagnostic imaging of breast: Secondary | ICD-10-CM

## 2018-05-31 DIAGNOSIS — R922 Inconclusive mammogram: Secondary | ICD-10-CM | POA: Diagnosis not present

## 2018-05-31 DIAGNOSIS — R928 Other abnormal and inconclusive findings on diagnostic imaging of breast: Secondary | ICD-10-CM

## 2018-06-06 ENCOUNTER — Ambulatory Visit
Admission: RE | Admit: 2018-06-06 | Discharge: 2018-06-06 | Disposition: A | Discharge status: Home / Self Care | Payer: 59 | Source: Ambulatory Visit | Attending: Obstetrics and Gynecology | Admitting: Obstetrics and Gynecology

## 2018-06-06 DIAGNOSIS — R921 Mammographic calcification found on diagnostic imaging of breast: Secondary | ICD-10-CM

## 2018-06-06 DIAGNOSIS — D241 Benign neoplasm of right breast: Secondary | ICD-10-CM | POA: Diagnosis not present

## 2018-06-13 ENCOUNTER — Encounter: Payer: Self-pay | Admitting: Internal Medicine

## 2018-06-20 ENCOUNTER — Encounter: Payer: Self-pay | Admitting: Internal Medicine

## 2018-06-20 ENCOUNTER — Ambulatory Visit: Payer: Self-pay | Admitting: Internal Medicine

## 2018-06-20 ENCOUNTER — Ambulatory Visit: Payer: 59 | Admitting: Internal Medicine

## 2018-06-20 DIAGNOSIS — R040 Epistaxis: Secondary | ICD-10-CM | POA: Diagnosis not present

## 2018-06-20 NOTE — Patient Instructions (Signed)
Do not use the nasacort that you have at home for a couple of weeks.   You can use vaseline on the rim of the nose to help. Use the humidifier in the room at night to help too.

## 2018-06-20 NOTE — Telephone Encounter (Signed)
She called in c/o coughing up blood and having a nose bleed on and off over the last 2 days.      See triage notes.  I made her an appt with Dr. Sharlet Salina for today at 3:40.  I forwarded these triage notes to Dr. Nathanial Millman nurse pool.    Reason for Disposition . [1] Coughed up blood AND [2] > 1 tablespoon (15 ml) (Exception: blood-tinged sputum)  Answer Assessment - Initial Assessment Questions 1. ONSET: "When did you start coughing up blood?"     Yesterday my nose bled.   It was blood coming up when I coughed.   Today I feel like something is there in my nose.   I feel like something is full in my throat.    I'm not sick with a cold or nothing. 2. SEVERITY: "How many times?" "How much blood?" (e.g., flecks, streaks, tablespoons, etc)     Yesterday my nose bled 4 times starting 5:30 PM.   Today my nose has bled 4 times so far.   I'm coughing up blood clots.    3. COUGHING SPASMS: "Did the blood appear after a coughing spell?"       No 4. RESPIRATORY DISTRESS: "Describe your breathing."      When my nose is bleeding it bothers my breathing. 5. FEVER: "Do you have a fever?" If so, ask: "What is your temperature, how was it measured, and when did it start?"     No 6. SPUTUM: "Describe the color of your sputum" (clear, white, yellow, green), "Has there been any change recently?"     Bloody. 7. CARDIAC HISTORY: "Do you have any history of heart disease?" (e.g., heart attack, congestive heart failure)      No 8. LUNG HISTORY: "Do you have any history of lung disease?"  (e.g., pulmonary embolus, asthma, emphysema)     Have asthma 9. PE RISK FACTORS: "Do you have a history of blood clots?" (or: recent major surgery, recent prolonged travel, bedridden)     No 10. OTHER SYMPTOMS: "Do you have any other symptoms?" (e.g., nosebleed, chest pain, abdominal pain, vomiting)       No pain. 11. PREGNANCY: "Is there any chance you are pregnant?" "When was your last menstrual period?"       Not asked  due to age 63. TRAVEL: "Have you traveled out of the country in the last month?" (e.g., travel history, exposures)       No  Protocols used: COUGHING UP BLOOD-A-AH

## 2018-06-20 NOTE — Progress Notes (Signed)
   Subjective:   Patient ID: [name], female    DOB: 03-28-1956, 63 y.o.   MRN: [id number]  HPI The patient is a 63 YO female coming in for nosebleeds. Has had them before. Has had several in the last 2-3 days. She does have some chronic allergies and is taking oral allergy medication. She does have nasacort but not using presently. She is using saline spray in her nose rarely. Has humidifier but not using. Denies picking nose. Blowing nose a lot lately due to drainage. Denies fevers or chills. Denies body aches. Denies cough or SOB. Nosebleeds last <3 minutes and stop with pressure or just stop. Was concerned due to how many. Not taking otc pain medication. Does not take any blood thinners.   Review of Systems  Constitutional: Negative.   HENT: Positive for congestion, nosebleeds and postnasal drip.   Eyes: Negative.   Respiratory: Negative for cough, chest tightness and shortness of breath.   Cardiovascular: Negative for chest pain, palpitations and leg swelling.  Gastrointestinal: Negative for abdominal distention, abdominal pain, constipation, diarrhea, nausea and vomiting.  Musculoskeletal: Negative.   Skin: Negative.   Neurological: Negative.   Psychiatric/Behavioral: Negative.     Objective:  Physical Exam Constitutional:      Appearance: She is well-developed.  HENT:     Head: Normocephalic and atraumatic.     Comments: Oropharynx with redness and clear drainage, nose with swollen turbinates, TMs normal bilaterally.     Nose:     Comments: No exposed blood vessels and no crusted blood in nose. Neck:     Musculoskeletal: Normal range of motion.     Thyroid: No thyromegaly.  Cardiovascular:     Rate and Rhythm: Normal rate and regular rhythm.  Pulmonary:     Effort: Pulmonary effort is normal. No respiratory distress.     Breath sounds: Normal breath sounds. No wheezing or rales.  Abdominal:     General: Bowel sounds are normal. There is no distension.   Palpations: Abdomen is soft.     Tenderness: There is no abdominal tenderness. There is no rebound.  Musculoskeletal:        General: No tenderness.  Lymphadenopathy:     Cervical: No cervical adenopathy.  Skin:    General: Skin is warm and dry.  Neurological:     Mental Status: She is alert and oriented to person, place, and time.     Coordination: Coordination normal.     Vitals:   06/20/18 1536  BP: 118/72  Pulse: 91  Temp: 97.8 F (36.6 C)  TempSrc: Oral  SpO2: 99%  Weight: 196 lb (88.9 kg)  Height: 5\' 4"  (1.626 m)    Assessment & Plan:

## 2018-06-21 NOTE — Assessment & Plan Note (Signed)
Does not sound to last long enough for significant blood loss. Advised afrin if needed. Hold pressure. Humidifier in room. Avoid picking or blowing often. Avoid nasacort. Continue oral allergy medicine and saline nose spray.

## 2018-07-04 ENCOUNTER — Other Ambulatory Visit (INDEPENDENT_AMBULATORY_CARE_PROVIDER_SITE_OTHER): Payer: 59

## 2018-07-04 ENCOUNTER — Telehealth: Payer: Self-pay

## 2018-07-04 DIAGNOSIS — E039 Hypothyroidism, unspecified: Secondary | ICD-10-CM | POA: Diagnosis not present

## 2018-07-04 DIAGNOSIS — E559 Vitamin D deficiency, unspecified: Secondary | ICD-10-CM | POA: Diagnosis not present

## 2018-07-04 LAB — TSH: TSH: 0.46 u[IU]/mL (ref 0.35–4.50)

## 2018-07-04 NOTE — Telephone Encounter (Signed)
TSH ordered for 4 week thyroid recheck her CPE office note by PCP

## 2018-07-09 ENCOUNTER — Encounter: Payer: Self-pay | Admitting: Internal Medicine

## 2018-07-12 DIAGNOSIS — M79641 Pain in right hand: Secondary | ICD-10-CM | POA: Insufficient documentation

## 2018-07-12 MED FILL — predniSONE 10 MG TABS: 10 | 21 days supply | Qty: 42 | Fill #0

## 2018-07-19 ENCOUNTER — Encounter (HOSPITAL_COMMUNITY): Payer: Self-pay

## 2018-07-19 ENCOUNTER — Emergency Department (HOSPITAL_COMMUNITY)
Admission: EM | Admit: 2018-07-19 | Discharge: 2018-07-19 | Disposition: A | Discharge status: Home / Self Care | Payer: 59 | Attending: Emergency Medicine | Admitting: Emergency Medicine

## 2018-07-19 ENCOUNTER — Emergency Department (HOSPITAL_COMMUNITY): Payer: 59

## 2018-07-19 DIAGNOSIS — Z79899 Other long term (current) drug therapy: Secondary | ICD-10-CM | POA: Insufficient documentation

## 2018-07-19 DIAGNOSIS — J45909 Unspecified asthma, uncomplicated: Secondary | ICD-10-CM | POA: Diagnosis not present

## 2018-07-19 DIAGNOSIS — Z87891 Personal history of nicotine dependence: Secondary | ICD-10-CM | POA: Diagnosis not present

## 2018-07-19 DIAGNOSIS — M25561 Pain in right knee: Secondary | ICD-10-CM | POA: Diagnosis not present

## 2018-07-19 DIAGNOSIS — E039 Hypothyroidism, unspecified: Secondary | ICD-10-CM | POA: Insufficient documentation

## 2018-07-19 DIAGNOSIS — I2510 Atherosclerotic heart disease of native coronary artery without angina pectoris: Secondary | ICD-10-CM | POA: Insufficient documentation

## 2018-07-19 MED ORDER — DICLOFENAC SODIUM 1 % TD GEL: 4.0000 g | Freq: Four times a day (QID) | TRANSDERMAL | 0 refills | Status: DC

## 2018-07-19 MED FILL — DICLOFENAC SODIUM 1 % GEL: 1 | 6 days supply | Qty: 100 | Fill #0

## 2018-07-19 NOTE — ED Notes (Signed)
Patient transported to X-ray 

## 2018-07-19 NOTE — Discharge Instructions (Addendum)
Apply ice and elevate for 30 minutes three times daily. Wear knee immobilizer as needed, weight bear as tolerated. Follow up with your orthopedist.

## 2018-07-19 NOTE — ED Notes (Signed)
Ortho at beside

## 2018-07-19 NOTE — ED Notes (Signed)
Patient repositioned. Right leg elevated. Ice pack applied to right knee. Pants taken off and warm blanket given. Good pedal pulses.
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
## 2018-07-19 NOTE — ED Notes (Addendum)
Patient able to put weight on right leg but can not ambulate far due to pain. Patient able to take a couple steps with walker. Patient states she has a walker at home and agrees that she thinks using her walker at home will be best. Patient calling husband to pick her up.

## 2018-07-19 NOTE — ED Triage Notes (Addendum)
Patient coming from short stay where she works. Patient was trying to roll in chair using legs and felt a sharp pain in right knee. Patient felt a "pop".  7/10 sharp pain.   A/Ox4 Wheelchair on arrival.

## 2018-07-19 NOTE — ED Notes (Signed)
PA at bedside.

## 2018-07-20 ENCOUNTER — Other Ambulatory Visit: Payer: Self-pay | Admitting: Internal Medicine

## 2018-07-20 MED FILL — LEVOTHYROXINE 125 MCG TABLE: 125 | 90 days supply | Qty: 90 | Fill #1

## 2018-07-20 MED FILL — PANTOPRAZOLE SOD DR 40 MG T: 40 | 90 days supply | Qty: 90 | Fill #1

## 2018-07-22 MED FILL — AMITRIPTYLINE HCL 50 MG TAB: 50 | 90 days supply | Qty: 90 | Fill #0

## 2018-07-23 ENCOUNTER — Telehealth (INDEPENDENT_AMBULATORY_CARE_PROVIDER_SITE_OTHER): Payer: Self-pay | Admitting: Radiology

## 2018-07-23 NOTE — Telephone Encounter (Signed)
Answered no to all screening questions.

## 2018-07-24 ENCOUNTER — Other Ambulatory Visit: Payer: Self-pay

## 2018-07-24 ENCOUNTER — Ambulatory Visit (INDEPENDENT_AMBULATORY_CARE_PROVIDER_SITE_OTHER): Payer: 59 | Admitting: Orthopaedic Surgery

## 2018-07-24 ENCOUNTER — Encounter (INDEPENDENT_AMBULATORY_CARE_PROVIDER_SITE_OTHER): Payer: Self-pay | Admitting: Orthopaedic Surgery

## 2018-07-24 DIAGNOSIS — M25561 Pain in right knee: Secondary | ICD-10-CM | POA: Diagnosis not present

## 2018-07-24 MED ORDER — LIDOCAINE HCL 1 % IJ SOLN
3.0000 mL | INTRAMUSCULAR | Status: AC | PRN
  Administered 2018-07-24: 3 mL

## 2018-07-24 MED ORDER — METHYLPREDNISOLONE ACETATE 40 MG/ML IJ SUSP
40.0000 mg | INTRAMUSCULAR | Status: AC | PRN
  Administered 2018-07-24: 40 mg via INTRA_ARTICULAR
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
## 2018-07-26 DIAGNOSIS — M79641 Pain in right hand: Secondary | ICD-10-CM | POA: Diagnosis not present

## 2018-08-07 ENCOUNTER — Ambulatory Visit (INDEPENDENT_AMBULATORY_CARE_PROVIDER_SITE_OTHER): Payer: 59 | Admitting: Orthopaedic Surgery

## 2018-08-07 ENCOUNTER — Encounter (INDEPENDENT_AMBULATORY_CARE_PROVIDER_SITE_OTHER): Payer: Self-pay | Admitting: Orthopaedic Surgery

## 2018-08-07 ENCOUNTER — Other Ambulatory Visit: Payer: Self-pay

## 2018-08-07 DIAGNOSIS — M25561 Pain in right knee: Secondary | ICD-10-CM | POA: Diagnosis not present

## 2018-08-07 NOTE — Progress Notes (Signed)
[name] is following up for her right knee.  She originally injured this knee after pivoting type of activity with that knee causes severe sharp pain.  X-rays were negative for any gross derangement.  I suspect she did something to the meniscus or even a stress fracture.  She has been in a knee immobilizer and we took her out of that and place a steroid injection in her knee.  She is now been walking without assistive device and with no limp.  She said the injection was helpful quite a bit and I placed a steroid injection in the knee at her last visit 2 weeks ago.  On exam again she is walking without a limp.  I did see her in the hospital last week walking and she did still have a slight limp but now she is doing much better overall.  Her knee exam is basically benign today and normal.  At this point she can follow-up as needed.  All question concerns were answered and addressed.  If she does have any type of event such as this again with that right knee I would recommend an MRI to assess for meniscal tear or stress fracture.

## 2018-09-11 MED FILL — ROSUVASTATIN CALCIUM 10 MG: 10 | 90 days supply | Qty: 90 | Fill #0

## 2018-09-12 ENCOUNTER — Encounter: Payer: Self-pay | Admitting: Internal Medicine

## 2018-09-12 MED ORDER — ROSUVASTATIN CALCIUM 10 MG PO TABS: 10.0000 mg | ORAL_TABLET | Freq: Every day | ORAL | 3 refills | Status: DC

## 2018-09-16 ENCOUNTER — Encounter: Payer: Self-pay | Admitting: Internal Medicine

## 2018-09-26 ENCOUNTER — Ambulatory Visit (INDEPENDENT_AMBULATORY_CARE_PROVIDER_SITE_OTHER): Payer: Self-pay | Admitting: Nurse Practitioner

## 2018-09-26 ENCOUNTER — Telehealth: Payer: Self-pay

## 2018-09-26 ENCOUNTER — Other Ambulatory Visit: Payer: Self-pay

## 2018-09-26 ENCOUNTER — Other Ambulatory Visit: Payer: 59

## 2018-09-26 VITALS — BP 115/70 | HR 96 | Temp 98.2°F | Resp 20 | Wt 204.0 lb

## 2018-09-26 DIAGNOSIS — J4521 Mild intermittent asthma with (acute) exacerbation: Secondary | ICD-10-CM

## 2018-09-26 DIAGNOSIS — R6889 Other general symptoms and signs: Secondary | ICD-10-CM | POA: Diagnosis not present

## 2018-09-26 DIAGNOSIS — Z20822 Contact with and (suspected) exposure to covid-19: Secondary | ICD-10-CM

## 2018-09-26 DIAGNOSIS — J309 Allergic rhinitis, unspecified: Secondary | ICD-10-CM

## 2018-09-26 MED ORDER — FLUTICASONE PROPIONATE 50 MCG/ACT NA SUSP: 2.0000 | Freq: Every day | NASAL | 0 refills | Status: DC

## 2018-09-26 MED ORDER — PSEUDOEPH-BROMPHEN-DM 30-2-10 MG/5ML PO SYRP: 5.0000 mL | ORAL_SOLUTION | Freq: Four times a day (QID) | ORAL | 0 refills | Status: AC | PRN

## 2018-09-26 MED ORDER — PREDNISONE 20 MG PO TABS: 40.0000 mg | ORAL_TABLET | Freq: Every day | ORAL | 0 refills | Status: AC

## 2018-09-26 MED ORDER — MONTELUKAST SODIUM 10 MG PO TABS: 10.0000 mg | ORAL_TABLET | Freq: Every day | ORAL | 0 refills | Status: DC

## 2018-09-26 MED FILL — FLUTICASONE PROP 50 MCG SPR: 50 | 30 days supply | Qty: 16 | Fill #0

## 2018-09-26 MED FILL — BROMPHENIR-PSEUDOEPHED-DM S: 30-2-10 | 7 days supply | Qty: 150 | Fill #0

## 2018-09-26 MED FILL — predniSONE 20 MG TABS: 20 | 5 days supply | Qty: 10 | Fill #0

## 2018-09-26 MED FILL — MONTELUKAST SOD 10 MG TAB: 10 | 30 days supply | Qty: 30 | Fill #0

## 2018-09-26 NOTE — Progress Notes (Signed)
MRN: [id number] DOB: [birth year]  Subjective:   [name] is a 63 y.o. female presenting for chief complaint of Cough (1 day) and Wheezing (1 day ) .  Reports that she was exposed to "some strong perfume" 4 days ago and began coughing.  Coughing improved over the next day. Cough began to worsen last night with increased coughing "spells" and wheezing. Patient also complains of  dry cough, wheezing and shortness of breath, fatigue. Has tried Claritin, Albuterol Inhaler and Mucinex  for relief. Denies fever, sinus headache, sinus congestion , sinus pain, rhinorrhea, itchy watery eyes, red eyes, ear fullness, ear drainage, sore throat, difficulty swallowing, pain with swallowing, inability to swallow, productive cough, chest tightness, chest pain and myalgia, night sweats, chills, malaise, decreased appetite, weight loss, nausea, vomiting, abdominal pain and diarrhea. Has not had known sick contact COVID-19 or contact with any undergoing testing fo COVID-19. Does have a  history of seasonal allergies and asthma. Patient informs her last exacerbation was 6 months ago and treated with antibiotics and steroids. Quit smoking approximately 40 years ago, at that time time smoked for approximately 3 years, smoking 1/2 ppd. Denies recent travel. Denies any other aggravating or relieving factors, no other questions or concerns.  Review of Systems  Constitutional: Positive for malaise/fatigue.  HENT: Negative.   Eyes: Negative.   Respiratory: Positive for cough, shortness of breath and wheezing.   Cardiovascular: Negative.   Gastrointestinal: Negative.   Skin: Negative.   Neurological: Negative.   Endo/Heme/Allergies: Positive for environmental allergies.    [name] has a current medication list which includes the following prescription(s): albuterol, cholecalciferol, cyanocobalamin, dextromethorphan-guaifenesin, diclofenac sodium, furosemide, ibuprofen, levothyroxine, loratadine, pantoprazole,  rosuvastatin, acetaminophen, amitriptyline, and prednisone. Also is allergic to amoxicillin; aspirin; latex; sulfonamide derivatives; sulfa antibiotics; and avelox [moxifloxacin hcl in nacl].  [name]  has a past medical history of Allergy, Anxiety, Arthritis, Asthma, CAD (coronary artery disease), Chronic LBP (41/10/4079), Complication of anesthesia, Diverticulosis of colon, Family history of anesthesia complication, GERD (gastroesophageal reflux disease), Head injury, closed, with concussion (1997), History of renal stone (04/15/2011), adenomatous colonic polyps, Hyperlipidemia, Hypothyroidism, Hypotonic bladder, Lumbar disc disease, Nephrolithiasis, Obesity (03/29/2011), PONV (postoperative nausea and vomiting), Shortness of breath, Thyroid disease, and Varicose vein. Also  has a past surgical history that includes Abdominal hysterectomy; Cholecystectomy; Ovarian cyst surgery; Breast surgery (11/2007); Cystoscopy (2008); Wisdom tooth extraction; Nasal sinus surgery; Lumbar laminectomy/decompression microdiscectomy (Left, 12/18/2012); Bladder surgery; Breast excisional biopsy (Left); Breast biopsy (Bilateral); NM MYOVIEW LTD (02/2016); transthoracic echocardiogram (06/2011); and CORONARY CT ANGIOGRAM (02/2017).   Objective:   Vitals: BP 115/70 (BP Location: Right Arm, Patient Position: Sitting, Cuff Size: Large)   Pulse 96   Temp 98.2 F (36.8 C) (Oral)   Resp 20   Wt 204 lb (92.5 kg)   SpO2 97%   BMI 35.02 kg/m   Physical Exam Vitals signs reviewed.  HENT:     Head: Normocephalic.     Right Ear: Tympanic membrane, ear canal and external ear normal.     Left Ear: Tympanic membrane, ear canal and external ear normal.     Nose: Mucosal edema present.     Right Turbinates: Enlarged and swollen.     Left Turbinates: Enlarged and swollen.     Right Sinus: Maxillary sinus tenderness present. No frontal sinus tenderness.     Left Sinus: Maxillary sinus tenderness present. No frontal sinus  tenderness.     Mouth/Throat:     Lips: Pink.     Mouth: Mucous membranes  are moist.     Tongue: No lesions.     Pharynx: Uvula midline. Posterior oropharyngeal erythema present. No oropharyngeal exudate.     Tonsils: No tonsillar exudate. 0 on the right. 0 on the left.  Eyes:     Conjunctiva/sclera: Conjunctivae normal.     Pupils: Pupils are equal, round, and reactive to light.  Neck:     Musculoskeletal: Normal range of motion and neck supple. No muscular tenderness.  Cardiovascular:     Rate and Rhythm: Normal rate and regular rhythm.     Pulses: Normal pulses.     Heart sounds: Normal heart sounds.  Pulmonary:     Effort: Pulmonary effort is normal. No respiratory distress.     Breath sounds: Normal breath sounds. No stridor. No wheezing, rhonchi or rales.     Comments: Cough present during exam Abdominal:     General: Bowel sounds are normal.     Palpations: Abdomen is soft.     Tenderness: There is no abdominal tenderness.  Lymphadenopathy:     Cervical: No cervical adenopathy.  Skin:    General: Skin is warm and dry.     Capillary Refill: Capillary refill takes less than 2 seconds.  Neurological:     General: No focal deficit present.     Mental Status: She is alert and oriented to person, place, and time.  Psychiatric:        Mood and Affect: Mood normal.        Thought Content: Thought content normal.    Assessment and Plan :   Exam findings, diagnosis etiology and medication use and indications reviewed with patient. Follow- Up and discharge instructions provided. No emergent/urgent issues found on exam.  Based on the patient's symptoms, this is most likely an asthma exacerbation.  Patient did not demonstrate any shortness of breath, difficulty breathing, wheezing, rales, rhonchi, stridor, or other respiratory concerns on exam.  The patient is able to speak in complete sentences.  I do feel the patient did have some symptoms of allergic rhinitis to include maxillary  sinus tenderness.  I also wonder if the patient's exacerbation is impacted by her history of allergies.  The patient does not have a productive cough, fever, or other systemic symptoms to indicate a bacterial infection at this time.  I will treat the patient with a steroid burst to see if this helps her cough along with other symptomatic treatment to include fluticasone, Bromfed, and Singulair.  Patient did not need a refill for her albuterol inhaler at this time.  The patient is a Adult nurse, and did contact health at work.  I told her to follow-up with them as she has had a change in her symptoms since the onset.  I cannot 100% confirmed with this patient does not have COVID-19; therefore, I am going to send the patient for COVID-19 testing.  I have instructed the patient to remain home and isolated for the next 7 days, and to remain isolated until she has her continues to be fever free for at least 3 days.  Patient was given instructions for care at home for COVID-19.  I have spoken with Jenny Reichmann at the [facility] to get this arranged, I have instructed patient to go to the [facility] when she leaves this appointment.  I would like the patient to follow-up with her PCP within the next 2 to 3 days.  I have given the patient a work note to return on Monday, and  she will need to follow-up with her PCP if she needs to be out longer.  Patient education was provided. Patient verbalized understanding of information provided and agrees with plan of care (POC), all questions answered. The patient is advised to call or return to clinic if condition does not see an improvement in symptoms, or to seek the care of the closest emergency department if condition worsens with the above plan.   1. Exacerbation of intermittent asthma, unspecified asthma severity  - predniSONE (DELTASONE) 20 MG tablet; Take 2 tablets (40 mg total) by mouth daily with breakfast for 5 days.  Dispense: 10 tablet; Refill: 0 - montelukast  (SINGULAIR) 10 MG tablet; Take 1 tablet (10 mg total) by mouth at bedtime for 30 days.  Dispense: 30 tablet; Refill: 0 -Take medication as prescribed. Continue using your Albuterol inhaler every 6 hours as needed for wheezing, cough or shortness of breath. -Ibuprofen or Tylenol for pain, fever, or general discomfort. -Increase fluids. -Sleep elevated on at least 2 pillows at bedtime to help with cough. -Use a humidifier or vaporizer when at home and during sleep. -May use a teaspoon of honey or over-the-counter cough drops to help with cough. -May use normal saline nasal spray to help with nasal congestion throughout the day. -Go to the [facility] for COVID-19 testing.  Stay in your car and wear a face mask. -Call [facility] at [facility] and provide an update regarding your symptoms. -I would like you to follow up with your regular doctor within the next 2-3 days for follow-up. -Follow up in the ER if you develop shortness of breath, difficulty breathing or other concern  2. Allergic rhinitis, unspecified seasonality, unspecified trigger  - montelukast (SINGULAIR) 10 MG tablet; Take 1 tablet (10 mg total) by mouth at bedtime for 30 days.  Dispense: 30 tablet; Refill: 0 - brompheniramine-pseudoephedrine-DM 30-2-10 MG/5ML syrup; Take 5 mLs by mouth 4 (four) times daily as needed for up to 7 days.  Dispense: 150 mL; Refill: 0 - fluticasone (FLONASE) 50 MCG/ACT nasal spray; Place 2 sprays into both nostrils daily for 10 days.  Dispense: 16 g; Refill: 0  3. Suspected Covid-19 Virus Infection  -Take medication as prescribed.  Continue using your Albuterol inhaler every 6 hours as needed for wheezing, cough or shortness of breath. -Ibuprofen or Tylenol for pain, fever, or general discomfort. -Increase fluids. -Sleep elevated on at least 2 pillows at bedtime to help with cough. -Use a humidifier or vaporizer when at home and during sleep. -May use a teaspoon of honey or  over-the-counter cough drops to help with cough. -May use normal saline nasal spray to help with nasal congestion throughout the day. -Go to the [facility] for COVID-19 testing.  Stay in your car and wear a face mask. -Call [facility] and provide an update regarding your symptoms. -I would like you to follow up with your regular doctor within the next 2-3 days for follow-up. -Follow up in the ER if you develop shortness of breath, difficulty breathing or other concerns.

## 2018-09-26 NOTE — Patient Instructions (Addendum)
Allergic Rhinitis, Adult -Take medication as prescribed. Continue using your Albuterol inhaler every 6 hours as needed for wheezing, cough or shortness of breath. -Ibuprofen or Tylenol for pain, fever, or general discomfort. -Increase fluids. -Sleep elevated on at least 2 pillows at bedtime to help with cough. -Use a humidifier or vaporizer when at home and during sleep. -May use a teaspoon of honey or over-the-counter cough drops to help with cough. -May use normal saline nasal spray to help with nasal congestion throughout the day. -Go to the [facility] for COVID-19 testing.  Stay in your car and wear a face mask. -Call [facility] and provide an update regarding your symptoms.  -I would like you to follow up with your regular doctor within the next 2-3 days for follow-up.  -Follow up in the ER if you develop shortness of breath, difficulty breathing or other concerns.    Allergic rhinitis is an allergic reaction that affects the mucous membrane inside the nose. It causes sneezing, a runny or stuffy nose, and the feeling of mucus going down the back of the throat (postnasal drip). Allergic rhinitis can be mild to severe. There are two types of allergic rhinitis:  Seasonal. This type is also called hay fever. It happens only during certain seasons.  Perennial. This type can happen at any time of the year. What are the causes? This condition happens when the body's defense system (immune system) responds to certain harmless substances called allergens as though they were germs.  Seasonal allergic rhinitis is triggered by pollen, which can come from grasses, trees, and weeds. Perennial allergic rhinitis may be caused by:  House dust mites.  Pet dander.  Mold spores. What are the signs or symptoms? Symptoms of this condition include:  Sneezing.  Runny or stuffy nose (nasal congestion).  Postnasal drip.  Itchy nose.  Tearing of the eyes.   Trouble sleeping.  Daytime sleepiness. How is this diagnosed? This condition may be diagnosed based on:  Your medical history.  A physical exam.  Tests to check for related conditions, such as: ? Asthma. ? Pink eye. ? Ear infection. ? Upper respiratory infection.  Tests to find out which allergens trigger your symptoms. These may include skin or blood tests. How is this treated? There is no cure for this condition, but treatment can help control symptoms. Treatment may include:  Taking medicines that block allergy symptoms, such as antihistamines. Medicine may be given as a shot, nasal spray, or pill.  Avoiding the allergen.  Desensitization. This treatment involves getting ongoing shots until your body becomes less sensitive to the allergen. This treatment may be done if other treatments do not help.  If taking medicine and avoiding the allergen does not work, new, stronger medicines may be prescribed. Follow these instructions at home:  Find out what you are allergic to. Common allergens include smoke, dust, and pollen.  Avoid the things you are allergic to. These are some things you can do to help avoid allergens: ? Replace carpet with wood, tile, or vinyl flooring. Carpet can trap dander and dust. ? Do not smoke. Do not allow smoking in your home. ? Change your heating and air conditioning filter at least once a month. ? During allergy Isabel:  Keep windows closed as much as possible.  Plan outdoor activities when pollen counts are lowest. This is usually during the evening hours.  When coming indoors, change clothing and shower before sitting on furniture or  bedding.  Take over-the-counter and prescription medicines only as told by your health care provider.  Keep all follow-up visits as told by your health care provider. This is important. Contact a health care provider if:  You have a fever.  You develop a persistent cough.  You make whistling sounds when  you breathe (you wheeze).  Your symptoms interfere with your normal daily activities. Get help right away if:  You have shortness of breath. Summary  This condition can be managed by taking medicines as directed and avoiding allergens.  Contact your health care provider if you develop a persistent cough or fever.  During allergy Isabel, keep windows closed as much as possible. This information is not intended to replace advice given to you by your health care provider. Make sure you discuss any questions you have with your health care provider. Document Released: 01/03/2001 Document Revised: 05/18/2016 Document Reviewed: 05/18/2016 Elsevier Interactive Patient Education  2019 [employer].  Asthma Attack  Acute bronchospasm caused by asthma is also referred to as an asthma attack. Bronchospasm means that the air passages become narrowed or "tight," which limits the amount of oxygen that can get into the lungs. The narrowing is caused by inflammation and tightening of the muscles in the air tubes (bronchi) in the lungs. Excessive mucus is also produced, which narrows the airways more. This can cause trouble breathing, coughing, and loud breathing (wheezing). What are the causes? Possible triggers include:  Animal dander from the skin, hair, or feathers of animals.  Dust mites contained in house dust.  Cockroaches.  Pollen from trees or grass.  Mold.  Cigarette or tobacco smoke.  Air pollutants such as dust, household cleaners, hair sprays, aerosol sprays, paint fumes, strong chemicals, or strong odors.  Cold air or weather changes. Cold air may trigger inflammation. Winds increase molds and pollens in the air.  Strong emotions such as crying or laughing hard.  Stress.  Certain medicines, such as aspirin or beta-blockers.  Sulfites in foods and drinks, such as dried fruits and wine.  Infections or inflammatory conditions, such as a flu, a cold, pneumonia, or inflammation  of the nasal membranes (rhinitis).  Gastroesophageal reflux disease (GERD). GERD is a condition in which stomach acid backs up into your esophagus, which can irritate nearby airway structures.  Exercise or activity that requires a lot of energy. What are the signs or symptoms? Symptoms of this condition include:  Wheezing. This may sound like whistling while breathing. This may be more noticeable at night.  Excessive coughing, particularly at night.  Chest tightness or pain.  Shortness of breath.  Feeling like you cannot get enough air no matter how hard you try (air hunger). How is this diagnosed? This condition may be diagnosed based on:  Your medical history.  Your symptoms.  A physical exam.  Tests to check for other causes of your symptoms or other conditions that may have triggered your asthma attack. These tests may include: ? Chest X-ray. ? Blood tests. ? Specialized tests to assess lung function, such as breathing into a device that measures how much air you inhale and exhale (spirometry). How is this treated? The goal of treatment is to open the airways in your lungs and reduce inflammation. Most asthma attacks are treated with medicines that you inhale through a hand-held inhaler (metered dose inhaler, MDI) or a device that turns liquid medicine into a mist that you inhale (nebulizer). Medicines may include:  Quick relief or rescue medicines that relax  the muscles of the bronchi. These medicines include bronchodilators, such as albuterol.  Controller medicines, such as inhaled corticosteroids. These are long-acting medicines that are used for daily asthma maintenance. If you have a moderate or severe asthma attack, you may be treated with steroid medicines by mouth or through an IV injection at the hospital. Steroid medicines reduce inflammation in your lungs. Depending on the severity of your attack, you may need oxygen therapy to help you breathe. If your asthma  attack was caused by a bacterial infection, such as pneumonia, you will be given antibiotic medicines. Follow these instructions at home: Medicines  Take over-the-counter and prescription medicines only as told by your health care provider. Keep your medicines up-to-date and available.  If you are more than [redacted] weeks pregnant and you are prescribed any new medicines, tell your obstetrician about those medicines.  If you were prescribed an antibiotic medicine, take it as told by your health care provider. Do not stop taking the antibiotic even if you start to feel better. Avoiding triggers   Keep track of things that trigger your asthma attacks or cause you to have breathing problems, and avoid exposure to these triggers.  Do not use any products that contain nicotine or tobacco, such as cigarettes and e-cigarettes. If you need help quitting, ask your health care provider.  Avoid secondhand smoke.  Avoid strong smells, such as perfumes, aerosols, and cleaning solvents.  When pollen or air pollution is bad, keep windows closed and use an air conditioner or go to places with air conditioning. Asthma action plan  Work with your health care provider to make a written plan for managing and treating your asthma attacks (asthma action plan). This plan should include: ? A list of your asthma triggers and how to avoid them. ? Information about when your medicines should be taken and when their dosage should be changed. ? Instructions about using a device called a peak flow meter to monitor your condition. A peak flow meter measures how well your lungs are working and measures how severe your asthma is at a given time. Your "personal best" is the highest peak flow rate you can reach when you feel good and have no asthma symptoms. General instructions  Avoid excessive exercise or activity until your asthma attack resolves. Ask your health care provider what activities are safe for you and when you  can return to your normal activities.  Stay up to date on all vaccinations recommended by your health care provider, such as flu and pneumonia vaccines.  Drink enough fluid to keep your urine clear or pale yellow. Staying hydrated helps keep mucus in your lungs thin so it can be coughed up easily.  If you drink caffeine, do so in moderation.  Do not use alcohol until you have recovered.  Keep all follow-up visits as told by your health care provider. This is important. Asthma requires careful medical care, and you and your health care provider can work together to reduce the likelihood of future attacks. Contact a health care provider if:  Your peak flow reading is still at 50-79% of your personal best after you have followed your action plan for 1 hour. This is in the yellow zone, which means "caution."  You need to use a reliever medicine more than 2-3 times a week.  Your medicines are causing side effects, such as: ? Rash. ? Itching. ? Swelling. ? Trouble breathing.  Your symptoms do not improve after 48 hours.  You  cough up mucus (sputum) that is thicker than usual.  You have a fever.  You need to use your medicines much more frequently than normal. Get help right away if:  Your peak flow reading is less than 50% of your personal best. This is in the red zone, which means "danger."  You have severe trouble breathing.  You develop chest pain or discomfort.  Your medicines no longer seem to be helping.  You vomit.  You cannot eat or drink without vomiting.  You are coughing up yellow, green, brown, or bloody mucus.  You have a fever and your symptoms suddenly get worse.  You have trouble swallowing.  You feel very tired, and breathing becomes tiring. Summary  Acute bronchospasm caused by asthma is also referred to as an asthma attack.  Bronchospasm is caused by narrowing or tightness in air passages, which causes shortness of breath, coughing, and loud  breathing (wheezing).  Many things can trigger an asthma attack, such as allergens, weather changes, exercise, smoke, and other fumes.  Treatment for an asthma attack may include inhaled rescue medicines for immediate relief, as well as the use of maintenance therapy.  Get help right away if you have worsening shortness of breath, chest pain, or fever, or if your home medicines are no longer helping with your symptoms. This information is not intended to replace advice given to you by your health care provider. Make sure you discuss any questions you have with your health care provider. Document Released: 07/26/2006 Document Revised: 05/12/2016 Document Reviewed: 05/12/2016 Elsevier Interactive Patient Education  2019 [employer] Inc.  Cough, Adult  Coughing is a reflex that clears your throat and your airways. Coughing helps to heal and protect your lungs. It is normal to cough occasionally, but a cough that happens with other symptoms or lasts a long time may be a sign of a condition that needs treatment. A cough may last only 2-3 weeks (acute), or it may last longer than 8 weeks (chronic). What are the causes? Coughing is commonly caused by:  Breathing in substances that irritate your lungs.  A viral or bacterial respiratory infection.  Allergies.  Asthma.  Postnasal drip.  Smoking.  Acid backing up from the stomach into the esophagus (gastroesophageal reflux).  Certain medicines.  Chronic lung problems, including COPD (or rarely, lung cancer).  Other medical conditions such as heart failure. Follow these instructions at home: Pay attention to any changes in your symptoms. Take these actions to help with your discomfort:  Take medicines only as told by your health care provider. ? If you were prescribed an antibiotic medicine, take it as told by your health care provider. Do not stop taking the antibiotic even if you start to feel better. ? Talk with your health care  provider before you take a cough suppressant medicine.  Drink enough fluid to keep your urine clear or pale yellow.  If the air is dry, use a cold steam vaporizer or humidifier in your bedroom or your home to help loosen secretions.  Avoid anything that causes you to cough at work or at home.  If your cough is worse at night, try sleeping in a semi-upright position.  Avoid cigarette smoke. If you smoke, quit smoking. If you need help quitting, ask your health care provider.  Avoid caffeine.  Avoid alcohol.  Rest as needed. Contact a health care provider if:  You have new symptoms.  You cough up pus.  Your cough does not get better after  2-3 weeks, or your cough gets worse.  You cannot control your cough with suppressant medicines and you are losing sleep.  You develop pain that is getting worse or pain that is not controlled with pain medicines.  You have a fever.  You have unexplained weight loss.  You have night sweats. Get help right away if:  You cough up blood.  You have difficulty breathing.  Your heartbeat is very fast. This information is not intended to replace advice given to you by your health care provider. Make sure you discuss any questions you have with your health care provider. Document Released: 10/07/2010 Document Revised: 09/16/2015 Document Reviewed: 06/17/2014 Elsevier Interactive Patient Education  [employer]. This information is directly available on the CDC website: RunningShows.co.za.html    Source:CDC Reference to specific commercial products, manufacturers, companies, or trademarks does not constitute its endorsement or recommendation by the [place], [place], or Centers for [employer] and Prevention.    Person Under Monitoring Name: [name]  Location: [address] [place] [address]   CORONAVIRUS DISEASE 2019  (COVID-19) Guidance for Persons Under Investigation You are being tested for the virus that causes coronavirus disease 2019 (COVID-19). Public health actions are necessary to ensure protection of your health and the health of others, and to prevent further spread of infection. COVID-19 is caused by a virus that can cause symptoms, such as fever, cough, and shortness of breath. The primary transmission from person to person is by coughing or sneezing. On May 23, 2018, the [place] announced a [employer] Emergency of International Concern and on May 24, 2018 the U.S. Department of Health and Human Services declared a public health emergency. If the virus that causesCOVID-19 spreads in the community, it could have severe public health consequences.  As a person under investigation for COVID-19, the [place] advises you to adhere to the following guidance until your test results are reported to you. If your test result is positive, you will receive additional information from your provider and your local health department at that time.   Remain at home until you are cleared by your health provider or public health authorities.   Keep a log of visitors to your home using the form provided. Any visitors to your home must be aware of your isolation status.  If you plan to move to a new address or leave the county, notify the local health department in your county.  Call a doctor or seek care if you have an urgent medical need. Before seeking medical care, call ahead and get instructions from the provider before arriving at the medical office, clinic or hospital. Notify them that you are being tested for the virus that causes COVID-19 so arrangements can be made, as necessary, to prevent transmission to others in the healthcare setting. Next, notify the local health department in your county.  If a medical  emergency arises and you need to call 911, inform the first responders that you are being tested for the virus that causes COVID-19. Next, notify the local health department in your county.  Adhere to all guidance set forth by the [place] for [facility] of patients that is based on guidance from the Center for Disease Control and Prevention with suspected or confirmed COVID-19. It is provided with this guidance for Persons Under Investigation.  Your health and  the health of our community are our top priorities. Public Health officials remain available to provide assistance and counseling to you about COVID-19 and compliance with this guidance.  Provider: ____________________________________________________________ Date: ______/_____/_________  By signing below, you acknowledge that you have read and agree to comply with this Guidance for Persons Under Investigation. ______________________________________________________________ Date: ______/_____/_________  WHO DO I CALL? You can find a list of local health departments here: [email or web address] Health Department: ____________________________________________________________________ Contact Name: ________________________________________________________________________ Telephone: ___________________________________________________________________________  Marice Potter, [place], Communicable Disease Branch COVID-19 Guidance for Persons Under Investigation June 29, 2018   Person Under Monitoring Name: [name]  Location: [address] [place] [facility] [address]   Infection Prevention Recommendations for Individuals Confirmed to have, or Being Evaluated for, 2019 Novel Coronavirus (COVID-19) Infection Who Receive Care at Home  Individuals who are confirmed to have, or are being evaluated for, COVID-19 should follow the prevention  steps below until a healthcare provider or local or state health department says they can return to normal activities.  Stay home except to get medical care You should restrict activities outside your home, except for getting medical care. Do not go to work, school, or public areas, and do not use public transportation or taxis.  Call ahead before visiting your doctor Before your medical appointment, call the healthcare provider and tell them that you have, or are being evaluated for, COVID-19 infection. This will help the healthcare provider's office take steps to keep other people from getting infected. Ask your healthcare provider to call the local or state health department.  Monitor your symptoms Seek prompt medical attention if your illness is worsening (e.g., difficulty breathing). Before going to your medical appointment, call the healthcare provider and tell them that you have, or are being evaluated for, COVID-19 infection. Ask your healthcare provider to call the local or state health department.  Wear a facemask You should wear a facemask that covers your nose and mouth when you are in the same room with other people and when you visit a healthcare provider. People who live with or visit you should also wear a facemask while they are in the same room with you.  Separate yourself from other people in your home As much as possible, you should stay in a different room from other people in your home. Also, you should use a separate bathroom, if available.  Avoid sharing household items You should not share dishes, drinking glasses, cups, eating utensils, towels, bedding, or other items with other people in your home. After using these items, you should wash them thoroughly with soap and water.  Cover your coughs and sneezes Cover your mouth and nose with a tissue when you cough or sneeze, or you can cough or sneeze into your sleeve. Throw used tissues in a lined trash can, and  immediately wash your hands with soap and water for at least 20 seconds or use an alcohol-based hand rub.  Wash your [facility] your hands often and thoroughly with soap and water for at least 20 seconds. You can use an alcohol-based hand sanitizer if soap and water are not available and if your hands are not visibly dirty. Avoid touching your eyes, nose, and mouth with unwashed hands.   Prevention Steps for Caregivers and Household Members of Individuals Confirmed to have, or Being Evaluated for, COVID-19 Infection Being Cared for in the Home  If you live with, or provide care at home for, a person confirmed to have,  or being evaluated for, COVID-19 infection please follow these guidelines to prevent infection:  Follow healthcare provider's instructions Make sure that you understand and can help the patient follow any healthcare provider instructions for all care.  Provide for the patient's basic needs You should help the patient with basic needs in the home and provide support for getting groceries, prescriptions, and other personal needs.  Monitor the patient's symptoms If they are getting sicker, call his or her medical provider and tell them that the patient has, or is being evaluated for, COVID-19 infection. This will help the healthcare provider's office take steps to keep other people from getting infected. Ask the healthcare provider to call the local or state health department.  Limit the number of people who have contact with the patient  If possible, have only one caregiver for the patient.  Other household members should stay in another home or place of residence. If this is not possible, they should stay  in another room, or be separated from the patient as much as possible. Use a separate bathroom, if available.  Restrict visitors who do not have an essential need to be in the home.  Keep older adults, very young children, and other sick people away from the  patient Keep older adults, very young children, and those who have compromised immune systems or chronic health conditions away from the patient. This includes people with chronic heart, lung, or kidney conditions, diabetes, and cancer.  Ensure good ventilation Make sure that shared spaces in the home have good air flow, such as from an air conditioner or an opened window, weather permitting.  Wash your hands often  Wash your hands often and thoroughly with soap and water for at least 20 seconds. You can use an alcohol based hand sanitizer if soap and water are not available and if your hands are not visibly dirty.  Avoid touching your eyes, nose, and mouth with unwashed hands.  Use disposable paper towels to dry your hands. If not available, use dedicated cloth towels and replace them when they become wet.  Wear a facemask and gloves  Wear a disposable facemask at all times in the room and gloves when you touch or have contact with the patient's blood, body fluids, and/or secretions or excretions, such as sweat, saliva, sputum, nasal mucus, vomit, urine, or feces.  Ensure the mask fits over your nose and mouth tightly, and do not touch it during use.  Throw out disposable facemasks and gloves after using them. Do not reuse.  Wash your hands immediately after removing your facemask and gloves.  If your personal clothing becomes contaminated, carefully remove clothing and launder. Wash your hands after handling contaminated clothing.  Place all used disposable facemasks, gloves, and other waste in a lined container before disposing them with other household waste.  Remove gloves and wash your hands immediately after handling these items.  Do not share dishes, glasses, or other household items with the patient  Avoid sharing household items. You should not share dishes, drinking glasses, cups, eating utensils, towels, bedding, or other items with a patient who is confirmed to have, or  being evaluated for, COVID-19 infection.  After the person uses these items, you should wash them thoroughly with soap and water.  Wash laundry thoroughly  Immediately remove and wash clothes or bedding that have blood, body fluids, and/or secretions or excretions, such as sweat, saliva, sputum, nasal mucus, vomit, urine, or feces, on them.  Wear gloves when handling  laundry from the patient.  Read and follow directions on labels of laundry or clothing items and detergent. In general, wash and dry with the warmest temperatures recommended on the label.  Clean all areas the individual has used often  Clean all touchable surfaces, such as counters, tabletops, doorknobs, bathroom fixtures, toilets, phones, keyboards, tablets, and bedside tables, every day. Also, clean any surfaces that may have blood, body fluids, and/or secretions or excretions on them.  Wear gloves when cleaning surfaces the patient has come in contact with.  Use a diluted bleach solution (e.g., dilute bleach with 1 part bleach and 10 parts water) or a household disinfectant with a label that says EPA-registered for coronaviruses. To make a bleach solution at home, add 1 tablespoon of bleach to 1 quart (4 cups) of water. For a larger supply, add  cup of bleach to 1 gallon (16 cups) of water.  Read labels of cleaning products and follow recommendations provided on product labels. Labels contain instructions for safe and effective use of the cleaning product including precautions you should take when applying the product, such as wearing gloves or eye protection and making sure you have good ventilation during use of the product.  Remove gloves and wash hands immediately after cleaning.  Monitor yourself for signs and symptoms of illness Caregivers and household members are considered close contacts, should monitor their health, and will be asked to limit movement outside of the home to the extent possible. Follow the  monitoring steps for close contacts listed on the symptom monitoring form.   ? If you have additional questions, contact your local health department or call the epidemiologist on call at [phone] (available 24/7). ? This guidance is subject to change. For the most up-to-date guidance from [facility], please refer to their website: [email or web address]

## 2018-09-26 NOTE — Telephone Encounter (Signed)
Reynolds Bowl NP at [facility] called to schedule patient for testing. Pt scheduled and order placed. Pt is with NP Leath.  She will instruct patient on appointment time and place.    Nash Shearer NP [facility]. Ph [phone] Fax [phone]

## 2018-09-28 LAB — NOVEL CORONAVIRUS, NAA: SARS-CoV-2, NAA: NOT DETECTED

## 2018-09-29 ENCOUNTER — Encounter: Payer: Self-pay | Admitting: Internal Medicine

## 2018-09-30 NOTE — Telephone Encounter (Signed)
Hello, please ask pt to make doxy this evening or tomorrow

## 2018-10-01 ENCOUNTER — Ambulatory Visit (INDEPENDENT_AMBULATORY_CARE_PROVIDER_SITE_OTHER): Payer: 59 | Admitting: Internal Medicine

## 2018-10-01 ENCOUNTER — Other Ambulatory Visit: Payer: Self-pay

## 2018-10-01 DIAGNOSIS — J309 Allergic rhinitis, unspecified: Secondary | ICD-10-CM | POA: Diagnosis not present

## 2018-10-01 DIAGNOSIS — J4531 Mild persistent asthma with (acute) exacerbation: Secondary | ICD-10-CM

## 2018-10-01 DIAGNOSIS — F329 Major depressive disorder, single episode, unspecified: Secondary | ICD-10-CM | POA: Diagnosis not present

## 2018-10-01 DIAGNOSIS — F32A Depression, unspecified: Secondary | ICD-10-CM

## 2018-10-01 MED ORDER — FLUTICASONE-SALMETEROL 250-50 MCG/DOSE IN AEPB: 1.0000 | INHALATION_SPRAY | Freq: Two times a day (BID) | RESPIRATORY_TRACT | 3 refills | Status: DC

## 2018-10-01 NOTE — Patient Instructions (Signed)
Please take all new medication as prescribed - the advair as directed  Please continue all other medications as before, and refills have been done if requested.  Please have the pharmacy call with any other refills you may need.  Please continue your efforts at being more active, low cholesterol diet, and weight control.  Please keep your appointments with your specialists as you may have planned
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
## 2018-10-02 ENCOUNTER — Encounter: Payer: Self-pay | Admitting: Internal Medicine

## 2018-10-02 DIAGNOSIS — J45909 Unspecified asthma, uncomplicated: Secondary | ICD-10-CM | POA: Insufficient documentation

## 2018-10-02 MED FILL — ADVAIR 250/50 DISKUS: 250-50 | 30 days supply | Qty: 60 | Fill #0

## 2018-10-02 NOTE — Assessment & Plan Note (Signed)
Mild to mod, for claritin 10qd prn, nasacort asd,  to f/u any worsening symptoms or concerns

## 2018-10-02 NOTE — Assessment & Plan Note (Signed)
Mild uncontrolled, to add advair 250/50 asd, cont albuterol prn,  to f/u any worsening symptoms or concerns

## 2018-10-02 NOTE — Assessment & Plan Note (Signed)
stable overall by history and exam, recent data reviewed with pt, and pt to continue medical treatment as before,  to f/u any worsening symptoms or concerns  

## 2018-10-15 ENCOUNTER — Encounter: Payer: Self-pay | Admitting: Internal Medicine

## 2018-10-15 DIAGNOSIS — E785 Hyperlipidemia, unspecified: Secondary | ICD-10-CM

## 2018-10-15 DIAGNOSIS — E039 Hypothyroidism, unspecified: Secondary | ICD-10-CM

## 2018-10-15 DIAGNOSIS — R739 Hyperglycemia, unspecified: Secondary | ICD-10-CM

## 2018-10-16 ENCOUNTER — Other Ambulatory Visit (INDEPENDENT_AMBULATORY_CARE_PROVIDER_SITE_OTHER): Payer: 59

## 2018-10-16 DIAGNOSIS — E039 Hypothyroidism, unspecified: Secondary | ICD-10-CM

## 2018-10-16 DIAGNOSIS — E785 Hyperlipidemia, unspecified: Secondary | ICD-10-CM | POA: Diagnosis not present

## 2018-10-16 DIAGNOSIS — R739 Hyperglycemia, unspecified: Secondary | ICD-10-CM

## 2018-10-16 LAB — CBC WITH DIFFERENTIAL/PLATELET
Basophils Absolute: 0.1 K/uL (ref 0.0–0.1)
Basophils Relative: 1.1 % (ref 0.0–3.0)
Eosinophils Absolute: 0.4 K/uL (ref 0.0–0.7)
Eosinophils Relative: 6.8 % — ABNORMAL HIGH (ref 0.0–5.0)
HCT: 39.7 % (ref 36.0–46.0)
Hemoglobin: 13.3 g/dL (ref 12.0–15.0)
Lymphocytes Relative: 30.5 % (ref 12.0–46.0)
Lymphs Abs: 1.9 K/uL (ref 0.7–4.0)
MCHC: 33.4 g/dL (ref 30.0–36.0)
MCV: 93.8 fl (ref 78.0–100.0)
Monocytes Absolute: 0.5 K/uL (ref 0.1–1.0)
Monocytes Relative: 7.6 % (ref 3.0–12.0)
Neutro Abs: 3.4 K/uL (ref 1.4–7.7)
Neutrophils Relative %: 54 % (ref 43.0–77.0)
Platelets: 227 K/uL (ref 150.0–400.0)
RBC: 4.24 Mil/uL (ref 3.87–5.11)
RDW: 13.6 % (ref 11.5–15.5)
WBC: 6.2 K/uL (ref 4.0–10.5)

## 2018-10-16 LAB — BASIC METABOLIC PANEL
BUN: 17 mg/dL (ref 6–23)
CO2: 26 mEq/L (ref 19–32)
Calcium: 8.9 mg/dL (ref 8.4–10.5)
Chloride: 104 mEq/L (ref 96–112)
Creatinine, Ser: 0.73 mg/dL (ref 0.40–1.20)
GFR: 81.05 mL/min (ref >60.00)
Glucose, Bld: 89 mg/dL (ref 70–99)
Potassium: 3.6 mEq/L (ref 3.5–5.1)
Sodium: 138 mEq/L (ref 135–145)

## 2018-10-16 LAB — TSH: TSH: 11.84 u[IU]/mL — ABNORMAL HIGH (ref 0.35–4.50)

## 2018-10-16 LAB — HEPATIC FUNCTION PANEL
ALT: 18 U/L (ref 0–35)
AST: 16 U/L (ref 0–37)
Albumin: 4 g/dL (ref 3.5–5.2)
Alkaline Phosphatase: 80 U/L (ref 39–117)
Bilirubin, Direct: 0.1 mg/dL (ref 0.0–0.3)
Total Bilirubin: 0.4 mg/dL (ref 0.2–1.2)
Total Protein: 6.6 g/dL (ref 6.0–8.3)

## 2018-10-16 LAB — LIPID PANEL
Cholesterol: 156 mg/dL (ref 0–200)
HDL: 51.3 mg/dL (ref >39.00)
LDL Cholesterol: 83 mg/dL (ref 0–99)
NonHDL: 104.75
Total CHOL/HDL Ratio: 3
Triglycerides: 109 mg/dL (ref 0.0–149.0)
VLDL: 21.8 mg/dL (ref 0.0–40.0)

## 2018-10-16 LAB — T4, FREE: Free T4: 0.67 ng/dL (ref 0.60–1.60)

## 2018-10-17 LAB — HEMOGLOBIN A1C: Hgb A1c MFr Bld: 5.7 % (ref 4.6–6.5)

## 2018-10-21 ENCOUNTER — Encounter: Payer: Self-pay | Admitting: Internal Medicine

## 2018-10-23 MED FILL — PANTOPRAZOLE SOD DR 40 MG T: 40 | 90 days supply | Qty: 90 | Fill #2

## 2018-10-28 ENCOUNTER — Encounter: Payer: Self-pay | Admitting: Internal Medicine

## 2018-10-28 ENCOUNTER — Ambulatory Visit (INDEPENDENT_AMBULATORY_CARE_PROVIDER_SITE_OTHER): Payer: 59 | Admitting: Internal Medicine

## 2018-10-28 ENCOUNTER — Other Ambulatory Visit: Payer: Self-pay

## 2018-10-28 VITALS — BP 114/78 | HR 82 | Temp 98.4°F | Ht 64.0 in | Wt 204.0 lb

## 2018-10-28 DIAGNOSIS — E785 Hyperlipidemia, unspecified: Secondary | ICD-10-CM

## 2018-10-28 DIAGNOSIS — R7302 Impaired glucose tolerance (oral): Secondary | ICD-10-CM | POA: Diagnosis not present

## 2018-10-28 DIAGNOSIS — E559 Vitamin D deficiency, unspecified: Secondary | ICD-10-CM | POA: Diagnosis not present

## 2018-10-28 DIAGNOSIS — E538 Deficiency of other specified B group vitamins: Secondary | ICD-10-CM | POA: Diagnosis not present

## 2018-10-28 DIAGNOSIS — E039 Hypothyroidism, unspecified: Secondary | ICD-10-CM | POA: Diagnosis not present

## 2018-10-28 DIAGNOSIS — E611 Iron deficiency: Secondary | ICD-10-CM | POA: Diagnosis not present

## 2018-10-28 DIAGNOSIS — Z0000 Encounter for general adult medical examination without abnormal findings: Secondary | ICD-10-CM | POA: Diagnosis not present

## 2018-10-28 MED ORDER — LEVOTHYROXINE SODIUM 150 MCG PO TABS: 150.0000 ug | ORAL_TABLET | Freq: Every day | ORAL | 3 refills | Status: DC

## 2018-10-28 MED FILL — LEVOTHYROXINE 150 MCG TAB: 150 | 90 days supply | Qty: 90 | Fill #0
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
## 2018-10-28 NOTE — Patient Instructions (Addendum)
Ok to increase the levothyroxine to 150 mcg per day  Please return for LAB only in 4 weeks to recheck thyroid only  Please continue all other medications as before, and refills have been done if requested.  Please have the pharmacy call with any other refills you may need.  Please continue your efforts at being more active, low cholesterol diet, and weight control.  Please keep your appointments with your specialists as you may have planned  Please return in 6 months, or sooner if needed, with Lab testing done 3-5 days before

## 2018-11-02 ENCOUNTER — Encounter: Payer: Self-pay | Admitting: Internal Medicine

## 2018-11-02 NOTE — Assessment & Plan Note (Signed)
stable overall by history and exam, recent data reviewed with pt, and pt to continue medical treatment as before,  to f/u any worsening symptoms or concerns  

## 2018-11-02 NOTE — Assessment & Plan Note (Signed)
Mild uncontrolled, to increase the levothyroxine to 150, f/u tft in 4 wks

## 2018-11-07 ENCOUNTER — Encounter: Payer: Self-pay | Admitting: Podiatry

## 2018-11-07 ENCOUNTER — Other Ambulatory Visit: Payer: Self-pay

## 2018-11-07 ENCOUNTER — Ambulatory Visit: Payer: 59 | Admitting: Podiatry

## 2018-11-07 VITALS — Temp 98.3°F

## 2018-11-07 DIAGNOSIS — B070 Plantar wart: Secondary | ICD-10-CM

## 2018-11-07 DIAGNOSIS — M7752 Other enthesopathy of left foot: Secondary | ICD-10-CM | POA: Diagnosis not present

## 2018-11-07 DIAGNOSIS — M779 Enthesopathy, unspecified: Secondary | ICD-10-CM

## 2018-11-07 DIAGNOSIS — Q828 Other specified congenital malformations of skin: Secondary | ICD-10-CM

## 2018-11-07 NOTE — Progress Notes (Signed)
Subjective:   Patient ID: [name], female   DOB: 63 y.o.   MRN: [id number]   HPI Patient presents with a lesion with inflammation fluid underneath the first metatarsal left and is not sure what she may have done.  Patient states she has been soaking and Epson salts and it is been going on for at least a month and patient does not smoke likes to be active and is not sure if she stepped on something   Review of Systems  All other systems reviewed and are negative.       Objective:  Physical Exam Vitals signs and nursing note reviewed.  Constitutional:      Appearance: She is well-developed.  Pulmonary:     Effort: Pulmonary effort is normal.  Musculoskeletal: Normal range of motion.  Skin:    General: Skin is warm.  Neurological:     Mental Status: She is alert.     Neurovascular status found to be intact muscle strength was adequate range of motion within normal limits.  Patient is found to have a lesion of the plantar left first metatarsal that is keratotic and there is fluid buildup underneath that that is painful when pressed.  Lesion measures about 4 x 4 mm and has a lucent core     Assessment:  Possibility for porokeratosis versus verruca plantaris lesion left along with inflammatory capsulitis     Plan:  H&P condition reviewed and today I did sterile prep and injected the plantar capsule 3 mg Dexasone Kenalog 5 mg Xylocaine and debrided the lesion fully.  I applied a small amount of medication to the area with sterile dressing and if it hurts again patient will be seen back

## 2018-11-11 ENCOUNTER — Other Ambulatory Visit: Payer: Self-pay | Admitting: Nurse Practitioner

## 2018-11-11 DIAGNOSIS — J309 Allergic rhinitis, unspecified: Secondary | ICD-10-CM

## 2018-11-13 ENCOUNTER — Encounter: Payer: Self-pay | Admitting: Internal Medicine

## 2018-11-13 ENCOUNTER — Other Ambulatory Visit: Payer: Self-pay | Admitting: Internal Medicine

## 2018-11-13 DIAGNOSIS — J309 Allergic rhinitis, unspecified: Secondary | ICD-10-CM

## 2018-11-14 MED ORDER — FLUTICASONE PROPIONATE 50 MCG/ACT NA SUSP: 2.0000 | Freq: Every day | NASAL | 2 refills | Status: DC

## 2018-11-14 MED FILL — FLUTICASONE PROP 50 MCG SPR: 50 | 30 days supply | Qty: 16 | Fill #0

## 2018-11-15 DIAGNOSIS — H2513 Age-related nuclear cataract, bilateral: Secondary | ICD-10-CM | POA: Diagnosis not present

## 2018-11-15 DIAGNOSIS — H5211 Myopia, right eye: Secondary | ICD-10-CM | POA: Diagnosis not present

## 2018-11-15 DIAGNOSIS — H524 Presbyopia: Secondary | ICD-10-CM | POA: Diagnosis not present

## 2018-11-20 DIAGNOSIS — B9689 Other specified bacterial agents as the cause of diseases classified elsewhere: Secondary | ICD-10-CM | POA: Diagnosis not present

## 2018-11-20 DIAGNOSIS — N3000 Acute cystitis without hematuria: Secondary | ICD-10-CM | POA: Diagnosis not present

## 2018-11-20 DIAGNOSIS — N390 Urinary tract infection, site not specified: Secondary | ICD-10-CM | POA: Diagnosis not present

## 2018-11-20 MED FILL — CIPROFLOXACIN HCL 500 MG TA: 500 | 7 days supply | Qty: 14 | Fill #0

## 2018-11-22 MED FILL — ROSUVASTATIN CALCIUM 10 MG: 10 | 90 days supply | Qty: 90 | Fill #0

## 2018-11-23 DIAGNOSIS — Z8719 Personal history of other diseases of the digestive system: Secondary | ICD-10-CM

## 2018-11-23 HISTORY — DX: Personal history of other diseases of the digestive system: Z87.19

## 2018-11-26 DIAGNOSIS — D2361 Other benign neoplasm of skin of right upper limb, including shoulder: Secondary | ICD-10-CM | POA: Diagnosis not present

## 2018-11-26 DIAGNOSIS — D2362 Other benign neoplasm of skin of left upper limb, including shoulder: Secondary | ICD-10-CM | POA: Diagnosis not present

## 2018-11-26 DIAGNOSIS — M7130 Other bursal cyst, unspecified site: Secondary | ICD-10-CM | POA: Diagnosis not present

## 2018-11-26 DIAGNOSIS — M71342 Other bursal cyst, left hand: Secondary | ICD-10-CM | POA: Diagnosis not present

## 2018-11-28 ENCOUNTER — Other Ambulatory Visit (INDEPENDENT_AMBULATORY_CARE_PROVIDER_SITE_OTHER): Payer: 59

## 2018-11-28 DIAGNOSIS — E039 Hypothyroidism, unspecified: Secondary | ICD-10-CM

## 2018-11-28 LAB — TSH: TSH: 0.11 u[IU]/mL — ABNORMAL LOW (ref 0.35–4.50)

## 2018-11-28 LAB — T4, FREE: Free T4: 1.49 ng/dL (ref 0.60–1.60)

## 2018-11-29 ENCOUNTER — Telehealth: Payer: Self-pay

## 2018-11-29 ENCOUNTER — Encounter: Payer: Self-pay | Admitting: Internal Medicine

## 2018-11-29 ENCOUNTER — Other Ambulatory Visit: Payer: Self-pay | Admitting: Internal Medicine

## 2018-11-29 DIAGNOSIS — E039 Hypothyroidism, unspecified: Secondary | ICD-10-CM

## 2018-11-29 MED ORDER — LEVOTHYROXINE SODIUM 150 MCG PO TABS: 150.0000 ug | ORAL_TABLET | ORAL | 3 refills | Status: DC

## 2018-11-29 MED ORDER — LEVOTHYROXINE SODIUM 125 MCG PO TABS: 125.0000 ug | ORAL_TABLET | ORAL | 3 refills | Status: DC

## 2018-11-29 MED FILL — LEVOTHYROXINE 125 MCG TABLE: 125 | 90 days supply | Qty: 45 | Fill #0

## 2018-11-29 NOTE — Telephone Encounter (Signed)
-----   Message from Biagio Borg, MD sent at 11/29/2018  1:29 PM EDT ----- Left message on MyChart, pt to cont same tx except  The test results show that your current treatment is OK, except the TSH is slight low, meaning the current medication is slightly too much.    We need to: 1)  Change the levothyroxine 150 to 1 tab every other day 2)  Also take levothyroxine 125 at 1 tab every other day 3)  Recheck thyroid tests in 4 weeks  Saranya Harlin to please inform pt, I will do rx x 2, and order

## 2018-11-29 NOTE — Telephone Encounter (Signed)
Pt has viewed results via MyChart  

## 2018-12-09 DIAGNOSIS — K5792 Diverticulitis of intestine, part unspecified, without perforation or abscess without bleeding: Secondary | ICD-10-CM | POA: Insufficient documentation

## 2018-12-09 DIAGNOSIS — R109 Unspecified abdominal pain: Secondary | ICD-10-CM | POA: Diagnosis not present

## 2018-12-09 DIAGNOSIS — K760 Fatty (change of) liver, not elsewhere classified: Secondary | ICD-10-CM | POA: Diagnosis not present

## 2018-12-09 DIAGNOSIS — K5732 Diverticulitis of large intestine without perforation or abscess without bleeding: Secondary | ICD-10-CM | POA: Diagnosis not present

## 2018-12-18 DIAGNOSIS — Z87440 Personal history of urinary (tract) infections: Secondary | ICD-10-CM | POA: Diagnosis not present

## 2018-12-23 ENCOUNTER — Other Ambulatory Visit: Payer: Self-pay

## 2018-12-23 ENCOUNTER — Encounter: Payer: Self-pay | Admitting: Internal Medicine

## 2018-12-23 ENCOUNTER — Ambulatory Visit (INDEPENDENT_AMBULATORY_CARE_PROVIDER_SITE_OTHER): Payer: 59 | Admitting: Internal Medicine

## 2018-12-23 DIAGNOSIS — K219 Gastro-esophageal reflux disease without esophagitis: Secondary | ICD-10-CM | POA: Diagnosis not present

## 2018-12-23 DIAGNOSIS — K5792 Diverticulitis of intestine, part unspecified, without perforation or abscess without bleeding: Secondary | ICD-10-CM

## 2018-12-23 DIAGNOSIS — R7302 Impaired glucose tolerance (oral): Secondary | ICD-10-CM | POA: Diagnosis not present

## 2018-12-23 DIAGNOSIS — J4531 Mild persistent asthma with (acute) exacerbation: Secondary | ICD-10-CM | POA: Diagnosis not present

## 2018-12-23 NOTE — Assessment & Plan Note (Signed)
stable overall by history and exam, recent data reviewed with pt, and pt to continue medical treatment as before,  to f/u any worsening symptoms or concerns  

## 2018-12-23 NOTE — Assessment & Plan Note (Signed)
Clinically resolved, pt reassured, no further eval or tx needed, last colonoscopy was 2019

## 2018-12-23 NOTE — Patient Instructions (Signed)
Please continue all other medications as before, and refills have been done if requested.  Please have the pharmacy call with any other refills you may need.  Please continue your efforts at being more active, low cholesterol diet, and weight control.  Please keep your appointments with your specialists as you may have planned     
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
## 2019-01-03 MED FILL — FLUTICASONE PROP 50 MCG SPR: 50 | 30 days supply | Qty: 16 | Fill #1

## 2019-01-27 ENCOUNTER — Encounter: Payer: Self-pay | Admitting: Internal Medicine

## 2019-01-29 ENCOUNTER — Encounter: Payer: Self-pay | Admitting: Internal Medicine

## 2019-02-03 MED FILL — PANTOPRAZOLE SOD DR 40 MG T: 40 | 90 days supply | Qty: 90 | Fill #3

## 2019-02-12 ENCOUNTER — Ambulatory Visit (INDEPENDENT_AMBULATORY_CARE_PROVIDER_SITE_OTHER): Payer: 59 | Admitting: Orthopaedic Surgery

## 2019-02-12 ENCOUNTER — Other Ambulatory Visit: Payer: Self-pay

## 2019-02-12 ENCOUNTER — Encounter: Payer: Self-pay | Admitting: Orthopaedic Surgery

## 2019-02-12 ENCOUNTER — Ambulatory Visit (INDEPENDENT_AMBULATORY_CARE_PROVIDER_SITE_OTHER): Payer: 59

## 2019-02-12 DIAGNOSIS — M25562 Pain in left knee: Secondary | ICD-10-CM

## 2019-02-12 DIAGNOSIS — G8929 Other chronic pain: Secondary | ICD-10-CM

## 2019-02-12 MED ORDER — LIDOCAINE HCL 1 % IJ SOLN
3.0000 mL | INTRAMUSCULAR | Status: AC | PRN
  Administered 2019-02-12: 3 mL

## 2019-02-12 MED ORDER — METHYLPREDNISOLONE ACETATE 40 MG/ML IJ SUSP
40.0000 mg | INTRAMUSCULAR | Status: AC | PRN
  Administered 2019-02-12: 40 mg via INTRA_ARTICULAR

## 2019-02-12 MED FILL — FLUTICASONE PROP 50 MCG SPR: 50 | 30 days supply | Qty: 16 | Fill #2
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
## 2019-02-13 ENCOUNTER — Encounter: Payer: Self-pay | Admitting: Orthopaedic Surgery

## 2019-02-13 ENCOUNTER — Ambulatory Visit: Payer: 59 | Admitting: Orthopaedic Surgery

## 2019-02-18 ENCOUNTER — Ambulatory Visit (HOSPITAL_COMMUNITY)
Admission: RE | Admit: 2019-02-18 | Discharge: 2019-02-18 | Disposition: A | Discharge status: Home / Self Care | Payer: 59 | Source: Ambulatory Visit | Attending: Orthopaedic Surgery | Admitting: Orthopaedic Surgery

## 2019-02-18 ENCOUNTER — Other Ambulatory Visit: Payer: Self-pay

## 2019-02-18 DIAGNOSIS — M25562 Pain in left knee: Secondary | ICD-10-CM | POA: Diagnosis not present

## 2019-02-18 DIAGNOSIS — G8929 Other chronic pain: Secondary | ICD-10-CM | POA: Diagnosis not present

## 2019-02-19 ENCOUNTER — Encounter: Payer: Self-pay | Admitting: Orthopaedic Surgery

## 2019-02-19 ENCOUNTER — Ambulatory Visit (HOSPITAL_COMMUNITY): Payer: 59

## 2019-02-19 ENCOUNTER — Other Ambulatory Visit: Payer: Self-pay | Admitting: Nurse Practitioner

## 2019-02-19 DIAGNOSIS — J4521 Mild intermittent asthma with (acute) exacerbation: Secondary | ICD-10-CM

## 2019-02-19 DIAGNOSIS — J309 Allergic rhinitis, unspecified: Secondary | ICD-10-CM

## 2019-03-01 ENCOUNTER — Other Ambulatory Visit: Payer: 59

## 2019-03-03 MED FILL — LEVOTHYROXINE 150 MCG TAB: 150 | 90 days supply | Qty: 45 | Fill #0

## 2019-03-03 MED FILL — LEVOTHYROXINE 125 MCG TABLE: 125 | 90 days supply | Qty: 45 | Fill #1

## 2019-03-26 ENCOUNTER — Other Ambulatory Visit: Payer: Self-pay | Admitting: Obstetrics and Gynecology

## 2019-03-26 DIAGNOSIS — Z1231 Encounter for screening mammogram for malignant neoplasm of breast: Secondary | ICD-10-CM

## 2019-04-03 DIAGNOSIS — I872 Venous insufficiency (chronic) (peripheral): Secondary | ICD-10-CM | POA: Diagnosis not present

## 2019-04-03 DIAGNOSIS — L308 Other specified dermatitis: Secondary | ICD-10-CM | POA: Diagnosis not present

## 2019-04-03 MED FILL — BETAMETHASONE DP 0.05% CRM: 0.05 | 20 days supply | Qty: 45 | Fill #0

## 2019-04-16 ENCOUNTER — Other Ambulatory Visit: Payer: Self-pay | Admitting: Internal Medicine

## 2019-04-16 DIAGNOSIS — J309 Allergic rhinitis, unspecified: Secondary | ICD-10-CM

## 2019-04-16 MED ORDER — FLUTICASONE PROPIONATE 50 MCG/ACT NA SUSP: 2.0000 | Freq: Every day | NASAL | 2 refills | Status: DC

## 2019-04-16 MED FILL — FLUTICASONE PROP 50 MCG SPR: 50 | 30 days supply | Qty: 16 | Fill #0

## 2019-04-21 ENCOUNTER — Encounter: Payer: Self-pay | Admitting: Internal Medicine

## 2019-04-21 NOTE — Telephone Encounter (Signed)
Notified pt labs are pending and need to come to [facility] office .

## 2019-04-23 ENCOUNTER — Other Ambulatory Visit (INDEPENDENT_AMBULATORY_CARE_PROVIDER_SITE_OTHER): Payer: 59

## 2019-04-23 ENCOUNTER — Encounter: Payer: Self-pay | Admitting: Internal Medicine

## 2019-04-23 DIAGNOSIS — E538 Deficiency of other specified B group vitamins: Secondary | ICD-10-CM

## 2019-04-23 DIAGNOSIS — Z0000 Encounter for general adult medical examination without abnormal findings: Secondary | ICD-10-CM | POA: Diagnosis not present

## 2019-04-23 DIAGNOSIS — E039 Hypothyroidism, unspecified: Secondary | ICD-10-CM | POA: Diagnosis not present

## 2019-04-23 DIAGNOSIS — E559 Vitamin D deficiency, unspecified: Secondary | ICD-10-CM | POA: Diagnosis not present

## 2019-04-23 DIAGNOSIS — E611 Iron deficiency: Secondary | ICD-10-CM | POA: Diagnosis not present

## 2019-04-23 DIAGNOSIS — R7302 Impaired glucose tolerance (oral): Secondary | ICD-10-CM

## 2019-04-23 LAB — CBC WITH DIFFERENTIAL/PLATELET
Basophils Absolute: 0.1 10*3/uL (ref 0.0–0.1)
Basophils Relative: 0.9 % (ref 0.0–3.0)
Eosinophils Absolute: 0.4 10*3/uL (ref 0.0–0.7)
Eosinophils Relative: 6.2 % — ABNORMAL HIGH (ref 0.0–5.0)
HCT: 41.9 % (ref 36.0–46.0)
Hemoglobin: 14.1 g/dL (ref 12.0–15.0)
Lymphocytes Relative: 29.9 % (ref 12.0–46.0)
Lymphs Abs: 1.8 10*3/uL (ref 0.7–4.0)
MCHC: 33.6 g/dL (ref 30.0–36.0)
MCV: 92.1 fl (ref 78.0–100.0)
Monocytes Absolute: 0.5 10*3/uL (ref 0.1–1.0)
Monocytes Relative: 8.1 % (ref 3.0–12.0)
Neutro Abs: 3.2 10*3/uL (ref 1.4–7.7)
Neutrophils Relative %: 54.9 % (ref 43.0–77.0)
Platelets: 228 10*3/uL (ref 150.0–400.0)
RBC: 4.55 Mil/uL (ref 3.87–5.11)
RDW: 13.4 % (ref 11.5–15.5)
WBC: 5.9 10*3/uL (ref 4.0–10.5)

## 2019-04-24 LAB — LIPID PANEL
Cholesterol: 153 mg/dL (ref 0–200)
HDL: 56.1 mg/dL (ref >39.00)
LDL Cholesterol: 82 mg/dL (ref 0–99)
NonHDL: 96.85
Total CHOL/HDL Ratio: 3
Triglycerides: 72 mg/dL (ref 0.0–149.0)
VLDL: 14.4 mg/dL (ref 0.0–40.0)

## 2019-04-24 LAB — HEMOGLOBIN A1C: Hgb A1c MFr Bld: 5.5 % (ref 4.6–6.5)

## 2019-04-24 LAB — URINALYSIS, ROUTINE W REFLEX MICROSCOPIC
Bilirubin Urine: NEGATIVE
Hgb urine dipstick: NEGATIVE
Ketones, ur: NEGATIVE
Nitrite: NEGATIVE
Specific Gravity, Urine: 1.02 (ref 1.000–1.030)
Total Protein, Urine: NEGATIVE
Urine Glucose: NEGATIVE
Urobilinogen, UA: 0.2 (ref 0.0–1.0)
pH: 5 (ref 5.0–8.0)

## 2019-04-24 LAB — IBC PANEL
Iron: 106 ug/dL (ref 42–145)
Saturation Ratios: 31.2 % (ref 20.0–50.0)
Transferrin: 243 mg/dL (ref 212.0–360.0)

## 2019-04-24 LAB — BASIC METABOLIC PANEL
BUN: 15 mg/dL (ref 6–23)
CO2: 26 mEq/L (ref 19–32)
Calcium: 9.8 mg/dL (ref 8.4–10.5)
Chloride: 102 mEq/L (ref 96–112)
Creatinine, Ser: 0.73 mg/dL (ref 0.40–1.20)
GFR: 80.91 mL/min (ref >60.00)
Glucose, Bld: 89 mg/dL (ref 70–99)
Potassium: 3.9 mEq/L (ref 3.5–5.1)
Sodium: 137 mEq/L (ref 135–145)

## 2019-04-24 LAB — HEPATIC FUNCTION PANEL
ALT: 27 U/L (ref 0–35)
AST: 22 U/L (ref 0–37)
Albumin: 4.4 g/dL (ref 3.5–5.2)
Alkaline Phosphatase: 101 U/L (ref 39–117)
Bilirubin, Direct: 0.1 mg/dL (ref 0.0–0.3)
Total Bilirubin: 0.5 mg/dL (ref 0.2–1.2)
Total Protein: 6.9 g/dL (ref 6.0–8.3)

## 2019-04-24 LAB — VITAMIN B12: Vitamin B-12: 528 pg/mL (ref 211–911)

## 2019-04-24 LAB — T4, FREE: Free T4: 1.45 ng/dL (ref 0.60–1.60)

## 2019-04-24 LAB — VITAMIN D 25 HYDROXY (VIT D DEFICIENCY, FRACTURES): VITD: 34.42 ng/mL (ref 30.00–100.00)

## 2019-04-24 LAB — TSH: TSH: 0.03 u[IU]/mL — ABNORMAL LOW (ref 0.35–4.50)

## 2019-04-30 DIAGNOSIS — I8312 Varicose veins of left lower extremity with inflammation: Secondary | ICD-10-CM | POA: Diagnosis not present

## 2019-04-30 DIAGNOSIS — I8311 Varicose veins of right lower extremity with inflammation: Secondary | ICD-10-CM | POA: Diagnosis not present

## 2019-05-01 ENCOUNTER — Encounter: Payer: Self-pay | Admitting: Internal Medicine

## 2019-05-01 ENCOUNTER — Other Ambulatory Visit: Payer: Self-pay

## 2019-05-01 ENCOUNTER — Ambulatory Visit (INDEPENDENT_AMBULATORY_CARE_PROVIDER_SITE_OTHER): Payer: 59 | Admitting: Internal Medicine

## 2019-05-01 VITALS — BP 116/78 | HR 84 | Temp 98.1°F | Resp 12 | Ht 64.0 in | Wt 206.2 lb

## 2019-05-01 DIAGNOSIS — M79604 Pain in right leg: Secondary | ICD-10-CM

## 2019-05-01 DIAGNOSIS — I879 Disorder of vein, unspecified: Secondary | ICD-10-CM | POA: Insufficient documentation

## 2019-05-01 DIAGNOSIS — R7302 Impaired glucose tolerance (oral): Secondary | ICD-10-CM | POA: Diagnosis not present

## 2019-05-01 DIAGNOSIS — Z0000 Encounter for general adult medical examination without abnormal findings: Secondary | ICD-10-CM | POA: Diagnosis not present

## 2019-05-01 DIAGNOSIS — M79605 Pain in left leg: Secondary | ICD-10-CM | POA: Diagnosis not present

## 2019-05-01 NOTE — Patient Instructions (Signed)
Please continue all other medications as before, and refills have been done if requested.  Please have the pharmacy call with any other refills you may need.  Please continue your efforts at being more active, low cholesterol diet, and weight control.  You are otherwise up to date with prevention measures today.  Please keep your appointments with your specialists as you may have planned  You will be contacted regarding the referral for: Leg nerve testing  Please return in 1 year for your yearly visit, or sooner if needed, with Lab testing done 3-5 days before at the [facility] - please make appt as you leave today for this
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
## 2019-05-02 ENCOUNTER — Other Ambulatory Visit: Payer: Self-pay

## 2019-05-02 DIAGNOSIS — R600 Localized edema: Secondary | ICD-10-CM

## 2019-05-02 DIAGNOSIS — M7989 Other specified soft tissue disorders: Secondary | ICD-10-CM

## 2019-05-02 DIAGNOSIS — I83893 Varicose veins of bilateral lower extremities with other complications: Secondary | ICD-10-CM

## 2019-05-02 DIAGNOSIS — M79669 Pain in unspecified lower leg: Secondary | ICD-10-CM

## 2019-05-03 ENCOUNTER — Encounter: Payer: Self-pay | Admitting: Internal Medicine

## 2019-05-03 NOTE — Assessment & Plan Note (Signed)
stable overall by history and exam, recent data reviewed with pt, and pt to continue medical treatment as before,  to f/u any worsening symptoms or concerns  

## 2019-05-03 NOTE — Assessment & Plan Note (Signed)
okf ro LE NCS/EMG after jan 25 per pt request

## 2019-05-03 NOTE — Assessment & Plan Note (Signed)

## 2019-05-06 ENCOUNTER — Other Ambulatory Visit: Payer: Self-pay | Admitting: Internal Medicine

## 2019-05-06 MED FILL — PANTOPRAZOLE SOD DR 40 MG T: 40 | 90 days supply | Qty: 90 | Fill #0

## 2019-05-06 NOTE — Telephone Encounter (Signed)
Please refill as per office routine med refill policy (all routine meds refilled for 3 mo or monthly per pt preference up to one year from last visit, then month to month grace period for 3 mo, then further med refills will have to be denied)  

## 2019-05-12 ENCOUNTER — Encounter: Payer: Self-pay | Admitting: Internal Medicine

## 2019-05-14 DIAGNOSIS — I8312 Varicose veins of left lower extremity with inflammation: Secondary | ICD-10-CM | POA: Diagnosis not present

## 2019-05-14 DIAGNOSIS — I8311 Varicose veins of right lower extremity with inflammation: Secondary | ICD-10-CM | POA: Diagnosis not present

## 2019-05-19 ENCOUNTER — Encounter: Payer: Self-pay | Admitting: Internal Medicine

## 2019-05-19 NOTE — Telephone Encounter (Signed)
Ok to let pcc's know, thanks

## 2019-05-19 NOTE — Telephone Encounter (Signed)
Patient calling to follow up with dr regarding the message she sent earlier

## 2019-05-20 ENCOUNTER — Encounter: Payer: 59 | Admitting: Neurology

## 2019-05-27 ENCOUNTER — Ambulatory Visit: Payer: 59

## 2019-05-29 ENCOUNTER — Ambulatory Visit
Admission: RE | Admit: 2019-05-29 | Discharge: 2019-05-29 | Disposition: A | Discharge status: Home / Self Care | Payer: 59 | Source: Ambulatory Visit | Attending: Obstetrics and Gynecology | Admitting: Obstetrics and Gynecology

## 2019-05-29 ENCOUNTER — Ambulatory Visit: Payer: 59

## 2019-05-29 ENCOUNTER — Other Ambulatory Visit: Payer: Self-pay

## 2019-05-29 DIAGNOSIS — Z1231 Encounter for screening mammogram for malignant neoplasm of breast: Secondary | ICD-10-CM | POA: Diagnosis not present

## 2019-06-02 MED FILL — LEVOTHYROXINE 150 MCG TAB: 150 | 90 days supply | Qty: 45 | Fill #1

## 2019-06-02 MED FILL — LEVOTHYROXINE 125 MCG TABLE: 125 | 90 days supply | Qty: 45 | Fill #2

## 2019-06-05 DIAGNOSIS — N952 Postmenopausal atrophic vaginitis: Secondary | ICD-10-CM | POA: Diagnosis not present

## 2019-06-05 DIAGNOSIS — Z01419 Encounter for gynecological examination (general) (routine) without abnormal findings: Secondary | ICD-10-CM | POA: Diagnosis not present

## 2019-06-05 DIAGNOSIS — N819 Female genital prolapse, unspecified: Secondary | ICD-10-CM | POA: Diagnosis not present

## 2019-06-05 DIAGNOSIS — Z6836 Body mass index (BMI) 36.0-36.9, adult: Secondary | ICD-10-CM | POA: Diagnosis not present

## 2019-06-10 MED FILL — ALPRAZolam 0.5 MG TABS: 0.5 | 1 days supply | Qty: 4 | Fill #0

## 2019-06-13 DIAGNOSIS — I8312 Varicose veins of left lower extremity with inflammation: Secondary | ICD-10-CM | POA: Diagnosis not present

## 2019-06-17 DIAGNOSIS — I8312 Varicose veins of left lower extremity with inflammation: Secondary | ICD-10-CM | POA: Diagnosis not present

## 2019-06-20 DIAGNOSIS — I8311 Varicose veins of right lower extremity with inflammation: Secondary | ICD-10-CM | POA: Diagnosis not present

## 2019-06-24 DIAGNOSIS — I8312 Varicose veins of left lower extremity with inflammation: Secondary | ICD-10-CM | POA: Diagnosis not present

## 2019-06-26 DIAGNOSIS — I8312 Varicose veins of left lower extremity with inflammation: Secondary | ICD-10-CM | POA: Diagnosis not present

## 2019-07-08 ENCOUNTER — Ambulatory Visit (INDEPENDENT_AMBULATORY_CARE_PROVIDER_SITE_OTHER): Payer: 59 | Admitting: Cardiology

## 2019-07-08 ENCOUNTER — Other Ambulatory Visit: Payer: Self-pay

## 2019-07-08 ENCOUNTER — Encounter: Payer: Self-pay | Admitting: Cardiology

## 2019-07-08 VITALS — BP 100/76 | HR 79 | Temp 95.5°F | Ht 65.0 in | Wt 209.0 lb

## 2019-07-08 DIAGNOSIS — E785 Hyperlipidemia, unspecified: Secondary | ICD-10-CM

## 2019-07-08 DIAGNOSIS — R002 Palpitations: Secondary | ICD-10-CM

## 2019-07-08 DIAGNOSIS — Z01810 Encounter for preprocedural cardiovascular examination: Secondary | ICD-10-CM | POA: Diagnosis not present

## 2019-07-08 DIAGNOSIS — I83893 Varicose veins of bilateral lower extremities with other complications: Secondary | ICD-10-CM | POA: Diagnosis not present

## 2019-07-08 NOTE — Patient Instructions (Signed)
Medication Instructions:  No changes  *If you need a refill on your cardiac medications before your next appointment, please call your pharmacy*   Lab Work:  Not needed  Testing/Procedures: Not needed   Follow-Up: At [facility], you and your health needs are our priority.  As part of our continuing mission to provide you with exceptional heart care, we have created designated Provider Care Teams.  These Care Teams include your primary Cardiologist (physician) and Advanced Practice Providers (APPs -  Physician Assistants and Nurse Practitioners) who all work together to provide you with the care you need, when you need it.    Your next appointment:   12 month(s)  The format for your next appointment:   In Person  Provider:   Glenetta Hew, MD   Other Instructions  you are cleared for surgery  - please have the surgeon's office contact  [place] office for official clearance.
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
## 2019-07-11 ENCOUNTER — Encounter: Payer: Self-pay | Admitting: Cardiology

## 2019-07-11 NOTE — Assessment & Plan Note (Signed)
Monitor did not show any significant rhythms in the past.  She has not really had that much of any palpitations that are controlled with as needed Xanax.  She is not on a beta-blocker or calcium channel blocker.  Actually not on any medications for blood pressure.

## 2019-07-11 NOTE — Assessment & Plan Note (Signed)
Now being followed again by vein and vascular.  She is taking as needed Lasix for swelling, but this is not associated with PND or orthopnea.  Not related to heart failure. Had a normal echocardiogram.

## 2019-07-11 NOTE — Assessment & Plan Note (Signed)
She had a completely negative/nonischemic coronary CTA within the last couple years and not having any angina.  She does not really have any active cardiac symptoms speak of.  No heart failure..  Nondiabetic with normal kidney function. Exam as such, she will be LOW RISK PATIENT for a LOW CARDIAC RISK SURGERY.  Risk with less than 1% for adverse cardiac event. Recommend proceeding to the OR without any further cardiac evaluation.  No medications need to be added or held.

## 2019-07-11 NOTE — Assessment & Plan Note (Signed)
She is pretty much below her target LDL now based on her coronary calcium score and CTA results.  She is on low-dose rosuvastatin and tolerating it relatively well.

## 2019-07-15 DIAGNOSIS — I8311 Varicose veins of right lower extremity with inflammation: Secondary | ICD-10-CM | POA: Diagnosis not present

## 2019-07-22 DIAGNOSIS — I8312 Varicose veins of left lower extremity with inflammation: Secondary | ICD-10-CM | POA: Diagnosis not present

## 2019-07-24 ENCOUNTER — Other Ambulatory Visit: Payer: Self-pay | Admitting: Internal Medicine

## 2019-07-24 ENCOUNTER — Encounter: Payer: Self-pay | Admitting: Internal Medicine

## 2019-07-24 MED FILL — ALBUTEROL SULFATE HFA 108 (: 108 (90 BAS | 25 days supply | Qty: 18 | Fill #0

## 2019-07-24 MED FILL — ALPRAZolam 0.5 MG TABS: 0.5 | 1 days supply | Qty: 4 | Fill #1

## 2019-07-29 DIAGNOSIS — I8311 Varicose veins of right lower extremity with inflammation: Secondary | ICD-10-CM | POA: Diagnosis not present

## 2019-07-31 DIAGNOSIS — N819 Female genital prolapse, unspecified: Secondary | ICD-10-CM | POA: Diagnosis not present

## 2019-08-04 MED FILL — PANTOPRAZOLE SOD DR 40 MG T: 40 | 90 days supply | Qty: 90 | Fill #1

## 2019-08-05 DIAGNOSIS — I8312 Varicose veins of left lower extremity with inflammation: Secondary | ICD-10-CM | POA: Diagnosis not present

## 2019-08-16 DIAGNOSIS — H6690 Otitis media, unspecified, unspecified ear: Secondary | ICD-10-CM

## 2019-08-16 DIAGNOSIS — R05 Cough: Secondary | ICD-10-CM | POA: Diagnosis not present

## 2019-08-16 DIAGNOSIS — Z03818 Encounter for observation for suspected exposure to other biological agents ruled out: Secondary | ICD-10-CM | POA: Diagnosis not present

## 2019-08-16 DIAGNOSIS — H66001 Acute suppurative otitis media without spontaneous rupture of ear drum, right ear: Secondary | ICD-10-CM | POA: Diagnosis not present

## 2019-08-16 DIAGNOSIS — J029 Acute pharyngitis, unspecified: Secondary | ICD-10-CM | POA: Diagnosis not present

## 2019-08-16 DIAGNOSIS — Z20828 Contact with and (suspected) exposure to other viral communicable diseases: Secondary | ICD-10-CM | POA: Diagnosis not present

## 2019-08-16 HISTORY — DX: Otitis media, unspecified, unspecified ear: H66.90

## 2019-08-28 ENCOUNTER — Encounter (HOSPITAL_BASED_OUTPATIENT_CLINIC_OR_DEPARTMENT_OTHER): Payer: Self-pay | Admitting: Obstetrics and Gynecology

## 2019-09-04 DIAGNOSIS — I8311 Varicose veins of right lower extremity with inflammation: Secondary | ICD-10-CM | POA: Diagnosis not present

## 2019-09-12 ENCOUNTER — Telehealth: Payer: Self-pay | Admitting: Internal Medicine

## 2019-09-12 MED FILL — LEVOTHYROXINE 125 MCG TABLE: 125 | 90 days supply | Qty: 45 | Fill #3

## 2019-09-12 MED FILL — LEVOTHYROXINE 150 MCG TAB: 150 | 90 days supply | Qty: 45 | Fill #2

## 2019-09-12 NOTE — Telephone Encounter (Signed)
New message:   1.Medication Requested: levothyroxine (SYNTHROID) 125 MCG tablet levothyroxine (SYNTHROID) 150 MCG tablet 2. Pharmacy (Name, Street, [place]): [place], [place] 3. On Med List: yes  4. Last Visit with PCP: 05/01/19  5. Next visit date with PCP: None   Agent: Please be advised that RX refills may take up to 3 business days. We ask that you follow-up with your pharmacy.

## 2019-09-15 ENCOUNTER — Other Ambulatory Visit: Payer: Self-pay

## 2019-09-15 MED ORDER — LEVOTHYROXINE SODIUM 150 MCG PO TABS: 150.0000 ug | ORAL_TABLET | ORAL | 3 refills | Status: DC

## 2019-09-15 MED ORDER — LEVOTHYROXINE SODIUM 125 MCG PO TABS: 125.0000 ug | ORAL_TABLET | ORAL | 3 refills | Status: DC

## 2019-09-23 DIAGNOSIS — I8312 Varicose veins of left lower extremity with inflammation: Secondary | ICD-10-CM | POA: Diagnosis not present

## 2019-09-24 ENCOUNTER — Encounter: Payer: Self-pay | Admitting: Internal Medicine

## 2019-09-25 ENCOUNTER — Encounter (HOSPITAL_BASED_OUTPATIENT_CLINIC_OR_DEPARTMENT_OTHER): Payer: Self-pay | Admitting: Obstetrics and Gynecology

## 2019-09-25 ENCOUNTER — Other Ambulatory Visit: Payer: Self-pay | Admitting: Internal Medicine

## 2019-09-25 MED FILL — AMITRIPTYLINE HCL 50 MG TAB: 50 | 90 days supply | Qty: 90 | Fill #0

## 2019-09-29 ENCOUNTER — Other Ambulatory Visit: Payer: Self-pay

## 2019-09-29 ENCOUNTER — Encounter (HOSPITAL_COMMUNITY)
Admission: RE | Admit: 2019-09-29 | Discharge: 2019-09-29 | Disposition: A | Discharge status: Home / Self Care | Payer: 59 | Source: Ambulatory Visit | Attending: Obstetrics and Gynecology | Admitting: Obstetrics and Gynecology

## 2019-09-29 DIAGNOSIS — Z01812 Encounter for preprocedural laboratory examination: Secondary | ICD-10-CM | POA: Diagnosis not present

## 2019-09-29 DIAGNOSIS — N819 Female genital prolapse, unspecified: Secondary | ICD-10-CM | POA: Diagnosis not present

## 2019-09-29 LAB — BASIC METABOLIC PANEL
Anion gap: 7 (ref 5–15)
BUN: 15 mg/dL (ref 8–23)
CO2: 25 mmol/L (ref 22–32)
Calcium: 9.3 mg/dL (ref 8.9–10.3)
Chloride: 108 mmol/L (ref 98–111)
Creatinine, Ser: 0.68 mg/dL (ref 0.44–1.00)
GFR calc Af Amer: >60 mL/min (ref >60)
GFR calc non Af Amer: >60 mL/min (ref >60)
Glucose, Bld: 87 mg/dL (ref 70–99)
Potassium: 3.9 mmol/L (ref 3.5–5.1)
Sodium: 140 mmol/L (ref 135–145)

## 2019-09-29 LAB — CBC
HCT: 40.5 % (ref 36.0–46.0)
Hemoglobin: 13.6 g/dL (ref 12.0–15.0)
MCH: 31.3 pg (ref 26.0–34.0)
MCHC: 33.6 g/dL (ref 30.0–36.0)
MCV: 93.1 fL (ref 80.0–100.0)
Platelets: 222 10*3/uL (ref 150–400)
RBC: 4.35 MIL/uL (ref 3.87–5.11)
RDW: 12.5 % (ref 11.5–15.5)
WBC: 6.9 10*3/uL (ref 4.0–10.5)
nRBC: 0 % (ref 0.0–0.2)

## 2019-09-29 LAB — ABO/RH: ABO/RH(D): AB NEG

## 2019-09-30 ENCOUNTER — Other Ambulatory Visit: Payer: Self-pay

## 2019-09-30 ENCOUNTER — Encounter (HOSPITAL_BASED_OUTPATIENT_CLINIC_OR_DEPARTMENT_OTHER): Payer: Self-pay | Admitting: Obstetrics and Gynecology

## 2019-09-30 NOTE — Progress Notes (Signed)
Spoke w/ via phone for pre-op interview--- PT Lab needs dos----no               Lab results------ CBC, BMP, T&S done 09-29-2019 results in epic;  Current ekg in epic COVID test ------ 10-03-2019 @ 1400 Arrive at ------- 0530 NPO after ------ MN Medications to take morning of surgery ----- Claritin, Crestor, Synthroid, Protonix w/ sips of water and do Advair inhaler Diabetic medication ----- n/a Patient Special Instructions ----- reviewed [facility] guidelines and visitor policy.  Asked to bring inhaler's and home medication in original prescription bottles dos Pre-Op special Istructions ----- cardiac clearance note 07-08-2019 in epic/ chart Patient verbalized understanding of instructions that were given at this phone interview. Patient denies shortness of breath, chest pain, fever, cough a this phone interview.    PCP:  Dr Cathlean Cower (lov 12-23-2018 epic) Cardiologist : Dr Ellyn Hack (lov 07-08-2019 epic) Chest x-ray : 03-22-2018 epic EKG : 07-08-2019 epic Echo : 07-19-2011 epic Stress test:  Nuclear 03-21-2016 epic CTA:  03-19-2017 epic Event monitor:  03-13-2017 epic Cardiac Cath :   no Sleep Study/ CPAP : NO Fasting Blood Sugar :      / Checks Blood Sugar -- times a day:   N/A Blood Thinner/ Instructions Maryjane Hurter Dose: NO ASA / Instructions/ Last Dose :  NO

## 2019-10-03 ENCOUNTER — Other Ambulatory Visit (HOSPITAL_COMMUNITY): Payer: 59

## 2019-10-03 ENCOUNTER — Other Ambulatory Visit (HOSPITAL_COMMUNITY)
Admission: RE | Admit: 2019-10-03 | Discharge: 2019-10-03 | Disposition: A | Discharge status: Home / Self Care | Payer: 59 | Source: Ambulatory Visit | Attending: Obstetrics and Gynecology | Admitting: Obstetrics and Gynecology

## 2019-10-03 DIAGNOSIS — Z20822 Contact with and (suspected) exposure to covid-19: Secondary | ICD-10-CM | POA: Insufficient documentation

## 2019-10-03 DIAGNOSIS — Z01812 Encounter for preprocedural laboratory examination: Secondary | ICD-10-CM | POA: Insufficient documentation

## 2019-10-03 LAB — SARS CORONAVIRUS 2 (TAT 6-24 HRS): SARS Coronavirus 2: NEGATIVE
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
## 2019-10-06 ENCOUNTER — Observation Stay (HOSPITAL_BASED_OUTPATIENT_CLINIC_OR_DEPARTMENT_OTHER)
Admission: RE | Admit: 2019-10-06 | Discharge: 2019-10-08 | Disposition: A | Discharge status: Home / Self Care | Payer: 59 | Source: Ambulatory Visit | Attending: Obstetrics and Gynecology | Admitting: Obstetrics and Gynecology

## 2019-10-06 ENCOUNTER — Observation Stay (HOSPITAL_BASED_OUTPATIENT_CLINIC_OR_DEPARTMENT_OTHER): Payer: 59 | Admitting: Anesthesiology

## 2019-10-06 ENCOUNTER — Encounter (HOSPITAL_BASED_OUTPATIENT_CLINIC_OR_DEPARTMENT_OTHER): Payer: Self-pay | Admitting: Obstetrics and Gynecology

## 2019-10-06 ENCOUNTER — Encounter (HOSPITAL_COMMUNITY)
Admission: RE | Disposition: A | Discharge status: Home / Self Care | Payer: Self-pay | Source: Ambulatory Visit | Attending: Obstetrics and Gynecology

## 2019-10-06 DIAGNOSIS — K5730 Diverticulosis of large intestine without perforation or abscess without bleeding: Secondary | ICD-10-CM | POA: Diagnosis not present

## 2019-10-06 DIAGNOSIS — Z7951 Long term (current) use of inhaled steroids: Secondary | ICD-10-CM | POA: Diagnosis not present

## 2019-10-06 DIAGNOSIS — Z87891 Personal history of nicotine dependence: Secondary | ICD-10-CM | POA: Diagnosis not present

## 2019-10-06 DIAGNOSIS — N819 Female genital prolapse, unspecified: Secondary | ICD-10-CM | POA: Diagnosis not present

## 2019-10-06 DIAGNOSIS — Z6836 Body mass index (BMI) 36.0-36.9, adult: Secondary | ICD-10-CM | POA: Diagnosis not present

## 2019-10-06 DIAGNOSIS — K219 Gastro-esophageal reflux disease without esophagitis: Secondary | ICD-10-CM | POA: Insufficient documentation

## 2019-10-06 DIAGNOSIS — F419 Anxiety disorder, unspecified: Secondary | ICD-10-CM | POA: Diagnosis not present

## 2019-10-06 DIAGNOSIS — E669 Obesity, unspecified: Secondary | ICD-10-CM | POA: Diagnosis not present

## 2019-10-06 DIAGNOSIS — M1990 Unspecified osteoarthritis, unspecified site: Secondary | ICD-10-CM | POA: Insufficient documentation

## 2019-10-06 DIAGNOSIS — E876 Hypokalemia: Secondary | ICD-10-CM | POA: Diagnosis not present

## 2019-10-06 DIAGNOSIS — N816 Rectocele: Secondary | ICD-10-CM

## 2019-10-06 DIAGNOSIS — F329 Major depressive disorder, single episode, unspecified: Secondary | ICD-10-CM | POA: Insufficient documentation

## 2019-10-06 DIAGNOSIS — Z79890 Hormone replacement therapy (postmenopausal): Secondary | ICD-10-CM | POA: Insufficient documentation

## 2019-10-06 DIAGNOSIS — N186 End stage renal disease: Secondary | ICD-10-CM | POA: Diagnosis not present

## 2019-10-06 DIAGNOSIS — E785 Hyperlipidemia, unspecified: Secondary | ICD-10-CM | POA: Insufficient documentation

## 2019-10-06 DIAGNOSIS — E039 Hypothyroidism, unspecified: Secondary | ICD-10-CM | POA: Diagnosis not present

## 2019-10-06 DIAGNOSIS — J45909 Unspecified asthma, uncomplicated: Secondary | ICD-10-CM | POA: Insufficient documentation

## 2019-10-06 DIAGNOSIS — Z79899 Other long term (current) drug therapy: Secondary | ICD-10-CM | POA: Insufficient documentation

## 2019-10-06 DIAGNOSIS — N8189 Other female genital prolapse: Secondary | ICD-10-CM | POA: Diagnosis not present

## 2019-10-06 DIAGNOSIS — I2510 Atherosclerotic heart disease of native coronary artery without angina pectoris: Secondary | ICD-10-CM | POA: Diagnosis not present

## 2019-10-06 HISTORY — DX: Localized edema: R60.0

## 2019-10-06 HISTORY — DX: Stress incontinence (female) (male): N39.3

## 2019-10-06 HISTORY — DX: Personal history of diseases of the blood and blood-forming organs and certain disorders involving the immune mechanism: Z86.2

## 2019-10-06 HISTORY — DX: Other intervertebral disc degeneration, lumbosacral region without mention of lumbar back pain or lower extremity pain: M51.379

## 2019-10-06 HISTORY — DX: Palpitations: R00.2

## 2019-10-06 HISTORY — DX: Mild persistent asthma, uncomplicated: J45.30

## 2019-10-06 HISTORY — DX: Asymptomatic varicose veins of bilateral lower extremities: I83.93

## 2019-10-06 HISTORY — DX: Other intervertebral disc degeneration, lumbosacral region: M51.37

## 2019-10-06 HISTORY — PX: ANTERIOR AND POSTERIOR REPAIR WITH SACROSPINOUS FIXATION: SHX6536

## 2019-10-06 LAB — TYPE AND SCREEN
ABO/RH(D): AB NEG
Antibody Screen: NEGATIVE

## 2019-10-06 LAB — HEMOGLOBIN AND HEMATOCRIT, BLOOD
HCT: 36.2 % (ref 36.0–46.0)
Hemoglobin: 12.4 g/dL (ref 12.0–15.0)

## 2019-10-06 SURGERY — ANTERIOR AND POSTERIOR REPAIR WITH SACROSPINOUS FIXATION
Anesthesia: General | Site: Vagina

## 2019-10-06 MED ORDER — LEVOTHYROXINE SODIUM 25 MCG PO TABS
125.0000 ug | ORAL_TABLET | ORAL | Status: DC
  Administered 2019-10-08: 125 ug via ORAL
  Filled 2019-10-06 (×2): qty 1

## 2019-10-06 MED ORDER — FENTANYL CITRATE (PF) 250 MCG/5ML IJ SOLN
INTRAMUSCULAR | Status: AC
  Filled 2019-10-06: qty 5

## 2019-10-06 MED ORDER — MENTHOL 3 MG MT LOZG
1.0000 | LOZENGE | OROMUCOSAL | Status: DC | PRN
  Administered 2019-10-06: 3 mg via ORAL
  Filled 2019-10-06: qty 9

## 2019-10-06 MED ORDER — WHITE PETROLATUM EX OINT
TOPICAL_OINTMENT | CUTANEOUS | Status: AC
  Filled 2019-10-06: qty 5

## 2019-10-06 MED ORDER — LACTATED RINGERS IV SOLN: INTRAVENOUS | Status: DC

## 2019-10-06 MED ORDER — ALPRAZOLAM 0.5 MG PO TABS
0.5000 mg | ORAL_TABLET | Freq: Every evening | ORAL | Status: DC | PRN
  Administered 2019-10-06 – 2019-10-07 (×2): 0.5 mg via ORAL
  Filled 2019-10-06: qty 1

## 2019-10-06 MED ORDER — ONDANSETRON HCL 4 MG PO TABS: 4.0000 mg | ORAL_TABLET | Freq: Four times a day (QID) | ORAL | Status: DC | PRN

## 2019-10-06 MED ORDER — MOMETASONE FURO-FORMOTEROL FUM 200-5 MCG/ACT IN AERO
2.0000 | INHALATION_SPRAY | Freq: Two times a day (BID) | RESPIRATORY_TRACT | Status: DC
  Filled 2019-10-06: qty 8.8

## 2019-10-06 MED ORDER — ONDANSETRON HCL 4 MG/2ML IJ SOLN
4.0000 mg | Freq: Four times a day (QID) | INTRAMUSCULAR | Status: DC | PRN
  Administered 2019-10-06 – 2019-10-07 (×2): 4 mg via INTRAVENOUS
  Filled 2019-10-06: qty 2

## 2019-10-06 MED ORDER — TRAMADOL HCL 50 MG PO TABS
50.0000 mg | ORAL_TABLET | Freq: Four times a day (QID) | ORAL | Status: DC | PRN
  Administered 2019-10-06: 50 mg via ORAL
  Filled 2019-10-06: qty 1

## 2019-10-06 MED ORDER — LIDOCAINE 2% (20 MG/ML) 5 ML SYRINGE
INTRAMUSCULAR | Status: AC
  Filled 2019-10-06: qty 5

## 2019-10-06 MED ORDER — HEPARIN SODIUM (PORCINE) 5000 UNIT/ML IJ SOLN
INTRAMUSCULAR | Status: AC
  Filled 2019-10-06: qty 1

## 2019-10-06 MED ORDER — FENTANYL CITRATE (PF) 100 MCG/2ML IJ SOLN: 25.0000 ug | INTRAMUSCULAR | Status: DC | PRN

## 2019-10-06 MED ORDER — LEVOTHYROXINE SODIUM 50 MCG PO TABS
150.0000 ug | ORAL_TABLET | ORAL | Status: DC
  Administered 2019-10-07: 150 ug via ORAL
  Filled 2019-10-06 (×2): qty 1

## 2019-10-06 MED ORDER — POLYETHYLENE GLYCOL 3350 17 G PO PACK
17.0000 g | PACK | Freq: Every day | ORAL | Status: DC
  Administered 2019-10-06 – 2019-10-08 (×2): 17 g via ORAL
  Filled 2019-10-06: qty 1

## 2019-10-06 MED ORDER — SOD CITRATE-CITRIC ACID 500-334 MG/5ML PO SOLN: 30.0000 mL | ORAL | Status: DC

## 2019-10-06 MED ORDER — FENTANYL CITRATE (PF) 100 MCG/2ML IJ SOLN
INTRAMUSCULAR | Status: DC | PRN
  Administered 2019-10-06: 100 ug via INTRAVENOUS

## 2019-10-06 MED ORDER — OXYCODONE HCL 5 MG PO TABS
ORAL_TABLET | ORAL | Status: AC
  Filled 2019-10-06: qty 1

## 2019-10-06 MED ORDER — ACETAMINOPHEN 10 MG/ML IV SOLN: 1000.0000 mg | Freq: Once | INTRAVENOUS | Status: DC | PRN

## 2019-10-06 MED ORDER — LORATADINE 10 MG PO TABS
10.0000 mg | ORAL_TABLET | Freq: Every day | ORAL | Status: DC
  Administered 2019-10-07 – 2019-10-08 (×2): 10 mg via ORAL
  Filled 2019-10-06 (×2): qty 1

## 2019-10-06 MED ORDER — DEXAMETHASONE SODIUM PHOSPHATE 10 MG/ML IJ SOLN
INTRAMUSCULAR | Status: AC
  Filled 2019-10-06: qty 1

## 2019-10-06 MED ORDER — ACETAMINOPHEN 325 MG PO TABS: 325.0000 mg | ORAL_TABLET | ORAL | Status: DC | PRN

## 2019-10-06 MED ORDER — ALPRAZOLAM 0.5 MG PO TABS
ORAL_TABLET | ORAL | Status: AC
  Filled 2019-10-06: qty 1

## 2019-10-06 MED ORDER — FLUORESCEIN SODIUM 10 % IV SOLN
INTRAVENOUS | Status: AC
  Filled 2019-10-06: qty 5

## 2019-10-06 MED ORDER — METHOCARBAMOL 500 MG PO TABS
500.0000 mg | ORAL_TABLET | Freq: Four times a day (QID) | ORAL | Status: DC | PRN
  Administered 2019-10-06 – 2019-10-07 (×2): 500 mg via ORAL
  Filled 2019-10-06: qty 1

## 2019-10-06 MED ORDER — ROSUVASTATIN CALCIUM 10 MG PO TABS
10.0000 mg | ORAL_TABLET | Freq: Every day | ORAL | Status: DC
  Administered 2019-10-06 – 2019-10-07 (×2): 10 mg via ORAL
  Filled 2019-10-06 (×3): qty 1

## 2019-10-06 MED ORDER — ACETAMINOPHEN 160 MG/5ML PO SOLN: 325.0000 mg | ORAL | Status: DC | PRN

## 2019-10-06 MED ORDER — KETOROLAC TROMETHAMINE 30 MG/ML IJ SOLN
INTRAMUSCULAR | Status: DC | PRN
  Administered 2019-10-06: 30 mg via INTRAVENOUS

## 2019-10-06 MED ORDER — HYDROMORPHONE HCL 1 MG/ML IJ SOLN
0.2000 mg | INTRAMUSCULAR | Status: DC | PRN
  Administered 2019-10-06 (×2): 0.5 mg via INTRAVENOUS
  Administered 2019-10-06: 0.4 mg via INTRAVENOUS
  Administered 2019-10-07: 0.5 mg via INTRAVENOUS
  Filled 2019-10-06: qty 1

## 2019-10-06 MED ORDER — ONDANSETRON HCL 4 MG/2ML IJ SOLN
INTRAMUSCULAR | Status: AC
  Filled 2019-10-06: qty 2

## 2019-10-06 MED ORDER — LIDOCAINE-EPINEPHRINE (PF) 1 %-1:200000 IJ SOLN
INTRAMUSCULAR | Status: DC | PRN
  Administered 2019-10-06: 5 mL

## 2019-10-06 MED ORDER — PROPOFOL 10 MG/ML IV BOLUS
INTRAVENOUS | Status: AC
  Filled 2019-10-06: qty 40

## 2019-10-06 MED ORDER — MIDAZOLAM HCL 2 MG/2ML IJ SOLN
INTRAMUSCULAR | Status: AC
  Filled 2019-10-06: qty 2

## 2019-10-06 MED ORDER — ONDANSETRON HCL 4 MG/2ML IJ SOLN
INTRAMUSCULAR | Status: DC | PRN
  Administered 2019-10-06: 4 mg via INTRAVENOUS

## 2019-10-06 MED ORDER — HYDROMORPHONE HCL 1 MG/ML IJ SOLN
INTRAMUSCULAR | Status: AC
  Filled 2019-10-06: qty 1

## 2019-10-06 MED ORDER — LEVOTHYROXINE SODIUM 150 MCG PO TABS: 150.0000 ug | ORAL_TABLET | ORAL | Status: DC

## 2019-10-06 MED ORDER — TRAMADOL HCL 50 MG PO TABS
ORAL_TABLET | ORAL | Status: AC
  Filled 2019-10-06: qty 1

## 2019-10-06 MED ORDER — FLUTICASONE PROPIONATE 50 MCG/ACT NA SUSP
2.0000 | NASAL | Status: DC | PRN
  Filled 2019-10-06: qty 16

## 2019-10-06 MED ORDER — MENTHOL 3 MG MT LOZG
LOZENGE | OROMUCOSAL | Status: AC
  Filled 2019-10-06: qty 9

## 2019-10-06 MED ORDER — PROMETHAZINE HCL 25 MG/ML IJ SOLN: 6.2500 mg | INTRAMUSCULAR | Status: DC | PRN

## 2019-10-06 MED ORDER — ACETAMINOPHEN 325 MG PO TABS
650.0000 mg | ORAL_TABLET | ORAL | Status: DC | PRN
  Administered 2019-10-07: 650 mg via ORAL
  Filled 2019-10-06: qty 2

## 2019-10-06 MED ORDER — PROPOFOL 10 MG/ML IV BOLUS
INTRAVENOUS | Status: DC | PRN
  Administered 2019-10-06: 160 mg via INTRAVENOUS

## 2019-10-06 MED ORDER — DEXAMETHASONE SODIUM PHOSPHATE 10 MG/ML IJ SOLN
INTRAMUSCULAR | Status: DC | PRN
  Administered 2019-10-06: 10 mg via INTRAVENOUS

## 2019-10-06 MED ORDER — OXYCODONE HCL 5 MG/5ML PO SOLN: 5.0000 mg | Freq: Once | ORAL | Status: DC | PRN

## 2019-10-06 MED ORDER — METHOCARBAMOL 500 MG PO TABS
ORAL_TABLET | ORAL | Status: AC
  Filled 2019-10-06: qty 1

## 2019-10-06 MED ORDER — LIDOCAINE 2% (20 MG/ML) 5 ML SYRINGE
INTRAMUSCULAR | Status: DC | PRN
  Administered 2019-10-06: 100 mg via INTRAVENOUS

## 2019-10-06 MED ORDER — ALBUTEROL SULFATE HFA 108 (90 BASE) MCG/ACT IN AERS
2.0000 | INHALATION_SPRAY | Freq: Four times a day (QID) | RESPIRATORY_TRACT | Status: DC | PRN
  Filled 2019-10-06: qty 6.7

## 2019-10-06 MED ORDER — HEPARIN SODIUM (PORCINE) 5000 UNIT/ML IJ SOLN
5000.0000 [IU] | Freq: Three times a day (TID) | INTRAMUSCULAR | Status: DC
  Administered 2019-10-06 – 2019-10-07 (×3): 5000 [IU] via SUBCUTANEOUS

## 2019-10-06 MED ORDER — CLINDAMYCIN PHOSPHATE 900 MG/50ML IV SOLN
INTRAVENOUS | Status: AC
  Filled 2019-10-06: qty 50

## 2019-10-06 MED ORDER — KETOROLAC TROMETHAMINE 30 MG/ML IJ SOLN
INTRAMUSCULAR | Status: AC
  Filled 2019-10-06: qty 1

## 2019-10-06 MED ORDER — AMITRIPTYLINE HCL 25 MG PO TABS
50.0000 mg | ORAL_TABLET | Freq: Every day | ORAL | Status: DC
  Administered 2019-10-06 – 2019-10-07 (×2): 50 mg via ORAL
  Filled 2019-10-06: qty 2
  Filled 2019-10-06 (×2): qty 1

## 2019-10-06 MED ORDER — OXYCODONE HCL 5 MG PO TABS
5.0000 mg | ORAL_TABLET | ORAL | Status: DC | PRN
  Administered 2019-10-06 – 2019-10-07 (×4): 5 mg via ORAL

## 2019-10-06 MED ORDER — POLYETHYLENE GLYCOL 3350 17 G PO PACK
PACK | ORAL | Status: AC
  Filled 2019-10-06: qty 1

## 2019-10-06 MED ORDER — GENTAMICIN SULFATE 40 MG/ML IJ SOLN
5.0000 mg/kg | INTRAVENOUS | Status: AC
  Administered 2019-10-06: 346 mg via INTRAVENOUS
  Filled 2019-10-06: qty 8.75

## 2019-10-06 MED ORDER — MEPERIDINE HCL 25 MG/ML IJ SOLN: 6.2500 mg | INTRAMUSCULAR | Status: DC | PRN

## 2019-10-06 MED ORDER — OXYCODONE HCL 5 MG PO TABS: 5.0000 mg | ORAL_TABLET | Freq: Once | ORAL | Status: DC | PRN

## 2019-10-06 MED ORDER — ALUM & MAG HYDROXIDE-SIMETH 200-200-20 MG/5ML PO SUSP: 30.0000 mL | ORAL | Status: DC | PRN

## 2019-10-06 MED ORDER — CLINDAMYCIN PHOSPHATE 900 MG/50ML IV SOLN
900.0000 mg | INTRAVENOUS | Status: AC
  Administered 2019-10-06: 900 mg via INTRAVENOUS

## 2019-10-06 MED ORDER — PANTOPRAZOLE SODIUM 40 MG PO TBEC
40.0000 mg | DELAYED_RELEASE_TABLET | Freq: Every day | ORAL | Status: DC
  Administered 2019-10-07 – 2019-10-08 (×2): 40 mg via ORAL
  Filled 2019-10-06 (×2): qty 1

## 2019-10-06 MED ORDER — MIDAZOLAM HCL 5 MG/5ML IJ SOLN
INTRAMUSCULAR | Status: DC | PRN
  Administered 2019-10-06: 2 mg via INTRAVENOUS

## 2019-10-06 SURGICAL SUPPLY — 38 items
ADH SKN CLS APL DERMABOND .7 (GAUZE/BANDAGES/DRESSINGS)
BLADE SURG 11 STRL SS (BLADE) IMPLANT
BLADE SURG 15 STRL LF DISP TIS (BLADE) ×1 IMPLANT
BLADE SURG 15 STRL SS (BLADE) ×2
CATH SILICONE 16FRX5CC (CATHETERS) ×1 IMPLANT
CLOTH BEACON ORANGE TIMEOUT ST (SAFETY) ×2 IMPLANT
COVER MAYO STAND STRL (DRAPES) ×2 IMPLANT
COVER WAND RF STERILE (DRAPES) ×2 IMPLANT
DECANTER SPIKE VIAL GLASS SM (MISCELLANEOUS) ×1 IMPLANT
DERMABOND ADVANCED (GAUZE/BANDAGES/DRESSINGS)
DERMABOND ADVANCED .7 DNX12 (GAUZE/BANDAGES/DRESSINGS) IMPLANT
DEVICE CAPIO SLIM SINGLE (INSTRUMENTS) ×2 IMPLANT
DISSECTOR ROUND CHERRY 3/8 STR (MISCELLANEOUS) IMPLANT
GAUZE 4X4 16PLY RFD (DISPOSABLE) ×2 IMPLANT
GAUZE PACKING 1 X5 YD ST (GAUZE/BANDAGES/DRESSINGS) ×2 IMPLANT
GLOVE BIO SURGEON STRL SZ8 (GLOVE) ×4 IMPLANT
GOWN STRL REUS W/TWL LRG LVL3 (GOWN DISPOSABLE) ×10 IMPLANT
GOWN STRL REUS W/TWL XL LVL3 (GOWN DISPOSABLE) ×1 IMPLANT
HOLDER FOLEY CATH W/STRAP (MISCELLANEOUS) ×2 IMPLANT
NDL MAYO CATGUT SZ4 TPR NDL (NEEDLE) ×1 IMPLANT
NEEDLE HYPO 22GX1.5 SAFETY (NEEDLE) ×2 IMPLANT
NEEDLE MAYO CATGUT SZ4 (NEEDLE) ×2 IMPLANT
NS IRRIG 500ML POUR BTL (IV SOLUTION) ×2 IMPLANT
PACK VAGINAL WOMENS (CUSTOM PROCEDURE TRAY) ×2 IMPLANT
PAD OB MATERNITY 4.3X12.25 (PERSONAL CARE ITEMS) ×2 IMPLANT
PLUG CATH AND CAP STER (CATHETERS) IMPLANT
SET IRRIG Y TYPE TUR BLADDER L (SET/KITS/TRAYS/PACK) IMPLANT
SPONGE LAP 4X18 RFD (DISPOSABLE) ×1 IMPLANT
SUT CAPIO POLYGLYCOLIC (SUTURE) ×1 IMPLANT
SUT MNCRL 0 MO-4 VIOLET 18 CR (SUTURE) ×1 IMPLANT
SUT MON AB 2-0 CT1 36 (SUTURE) ×4 IMPLANT
SUT MONOCRYL 0 MO 4 18  CR/8 (SUTURE) ×2
SUT VIC AB 2-0 CT2 27 (SUTURE) ×4 IMPLANT
SUT VIC AB 2-0 UR6 27 (SUTURE) IMPLANT
SYR BULB IRRIG 60ML STRL (SYRINGE) ×2 IMPLANT
TOWEL OR 17X26 10 PK STRL BLUE (TOWEL DISPOSABLE) ×3 IMPLANT
TRAY FOLEY W/BAG SLVR 14FR (SET/KITS/TRAYS/PACK) ×2 IMPLANT
WATER STERILE IRR 500ML POUR (IV SOLUTION) ×2 IMPLANT

## 2019-10-06 NOTE — Progress Notes (Signed)
10/06/2019  8:32 AM  PATIENT:  [name]  64 y.o. female  PRE-OPERATIVE DIAGNOSIS:  prolapse, rectocele  POST-OPERATIVE DIAGNOSIS:  prolapse, rectocele  PROCEDURE:  Procedure(s) with comments: POSTERIOR REPAIR WITH SACROSPINOUS FIXATION (N/A) - need bed  SURGEON:  Surgeon(s) and Role:    * Everlene Farrier, MD - Primary    * Molli Posey, MD - Assisting  PHYSICIAN ASSISTANT:   ASSISTANTS: none   ANESTHESIA:   general  EBL:  50 mL   BLOOD ADMINISTERED:none  DRAINS: Urinary Catheter (Foley)   LOCAL MEDICATIONS USED:  LIDOCAINE  and Amount: approx 10  ml  SPECIMEN:  No Specimen  DISPOSITION OF SPECIMEN:  N/A  COUNTS:  YES  TOURNIQUET:  * No tourniquets in log *  DICTATION: .Dictation pending PLAN OF CARE: Admit for overnight observation  PATIENT DISPOSITION:  PACU - hemodynamically stable.   Delay start of Pharmacological VTE agent (>24hrs) due to surgical blood loss or risk of bleeding: not applicable

## 2019-10-06 NOTE — Anesthesia Procedure Notes (Signed)
Procedure Name: LMA Insertion Date/Time: 10/06/2019 7:36 AM Performed by: Bonney Aid, CRNA Pre-anesthesia Checklist: Patient identified, Emergency Drugs available, Suction available and Patient being monitored Patient Re-evaluated:Patient Re-evaluated prior to induction Oxygen Delivery Method: Circle system utilized Preoxygenation: Pre-oxygenation with 100% oxygen Induction Type: IV induction Ventilation: Mask ventilation without difficulty LMA: LMA inserted LMA Size: 4.0 Number of attempts: 1 Airway Equipment and Method: Bite block Placement Confirmation: positive ETCO2 Tube secured with: Tape Dental Injury: Teeth and Oropharynx as per pre-operative assessment

## 2019-10-06 NOTE — Progress Notes (Signed)
No changes to H&P per patient history Reviewed procedure-A&P repair, possible SSLS All questions answered Patient states she understands and agrees

## 2019-10-06 NOTE — Anesthesia Postprocedure Evaluation (Signed)
Anesthesia Post Note  Patient: [name]  Procedure(s) Performed: POSTERIOR REPAIR WITH SACROSPINOUS FIXATION (N/A Vagina )     Patient location during evaluation: PACU Anesthesia Type: General Level of consciousness: awake and sedated Pain management: pain level controlled Vital Signs Assessment: post-procedure vital signs reviewed and stable Respiratory status: spontaneous breathing Cardiovascular status: stable Postop Assessment: no apparent nausea or vomiting Anesthetic complications: no   No complications documented.  Last Vitals:  Vitals:   10/06/19 0915 10/06/19 0925  BP: 106/72 (!) 144/75  Pulse: 66 70  Resp: 17 15  Temp:  36.4 C  SpO2: 94% 94%    Last Pain:  Vitals:   10/06/19 0925  TempSrc:   PainSc: [name]

## 2019-10-06 NOTE — Progress Notes (Signed)
Op note dictated # [id number]  I attempted to dictate this twice after surgery but could not connect to the dictation service using 2 different phones. I called medical records and spoke to [facility]. She checked later this morning and the line is working.

## 2019-10-06 NOTE — Progress Notes (Signed)
Patient c/o dysphagia, specifically with anything other than liquids. States is has been this way since surgery. Denies dyspnea, SOB, or sore throat. States "my throat doesn't feel sore or tight, just hard to swallow foods". Attempted to visually assess throat with penlight, unable to do so d/t patient's inability to open mouth wide enough to visualize. VSS, will notify MD upon arrival and continue to monitor.

## 2019-10-06 NOTE — Progress Notes (Signed)
Patient continues to c/o moderate vaginal and sacral/rectal pain despite analgesics. Received ultram 50 mg PO at 1000, oxycodone 5 mg PO at 1330, and dilaudid 0.4 mg  IV at 1100. Kpad given for lower abdominal discomfort, ice pack to lower back per patient request. Frequent repositioning attempted also without success. Attempted to ambulate patient, after dangling and standing at bedside only patient became distressed and tearful, c/o severe vaginal pressure and pain to perineal area upon standing, also c/o lightheadedness upon standing. Immediately assisted back to bed. Upon checking perineal pad, noted minimal amount of sanguineous drainage and changed pad. Dilaudid 0.5 IV given, VSS. Rechecked perineal pad 45 minutes later, only smear noted on pad. Patient states "pain is better" but still rates pain 5-6/10. Dr. Gaetano Net notified, new orders obtained for stat H&H, states he will be by to see patient shortly. Will continue to monitor.

## 2019-10-06 NOTE — Progress Notes (Signed)
Patient has had pain in bilateral pelvis and bilateral buttocks with ambulation. At rest she is much better. Has had Tylenol, oxycodone, IV dilaudid. Now feels better than she did this am. This morning noted pain as above when she tried to walk. No trouble drinking liquids. Is able to swallow solid food but says it feel harder to swallow. No SOB, no pain in her throat or neck.  Today's Vitals   10/06/19 1520 10/06/19 1609 10/06/19 1720 10/06/19 1839  BP:    (!) 105/57  Pulse:    76  Resp:    17  Temp:    98.3 F (36.8 C)  TempSrc:      SpO2:    95%  Weight:      Height:      PainSc: 6  5  5      Body mass index is 36.92 kg/m.   Results for orders placed or performed during the hospital encounter of 10/06/19 (from the past 24 hour(s))  Hemoglobin and hematocrit, blood     Status: None   Collection Time: 10/06/19  4:26 PM  Result Value Ref Range   Hemoglobin 12.4 12.0 - 15.0 g/dL   HCT 36.2 36 - 46 %   Patient in NAD, oriented Abdomen soft, NT Perineal pad with normal PO staining LE PAS on  A/P: PO pain         D/W patient that in view of stable Hgb after IV hydration I think the probability of a significant hematoma is low. Also pain is improved now compared to this am.         She just received narcotic. Will wait an hour and assess her pain. Can try Robaxin at that time.          She lists SOB with ASA. However, takes ibuprofen at home without trouble. Will hold this for now while receiving heparin.                    DVT prophylaxis-PAS on                                      Heparin 5.000 q8hours started at 1700           CBC, vaginal pack and foley out in am.

## 2019-10-06 NOTE — Op Note (Signed)
NAME: [name], [name] MEDICAL RECORD [id number] ACCOUNT [id number] DATE OF BIRTH:[birth year] FACILITY: [facility] LOCATION: [place], [place]  OPERATIVE REPORT  DATE OF PROCEDURE:  10/06/2019  PREOPERATIVE DIAGNOSIS:  Pelvic prolapse.  POSTOPERATIVE DIAGNOSIS:  Pelvic prolapse.  PROCEDURE:  Posterior vaginal repair with sacrospinous ligament suspension.  SURGEON:  Everlene Farrier, MD   ASSISTANT:  Molli Posey, MD   ANESTHESIA:  General with LMA, Lyn Hollingshead, M.D.  ESTIMATED BLOOD LOSS:  50 mL.  SPECIMENS:  None.  INDICATIONS AND CONSENT:  This patient is a 64 year old patient who is status post hysterectomy and anterior repair years ago with another physician.  She has complained about pelvic pressure over the years, which is now getting worse and she feels as  though something may fall out.  Anterior and posterior repair with possible sacrospinous ligament suspension has been discussed.  Potential risks and complications have been reviewed preoperatively, including but not limited to infection, organ damage,  bleeding requiring transfusion of blood products with HIV and hepatitis acquisition, DVT, PE, pneumonia, fistula formation, pelvic perineal pain and recurrent prolapse.  The patient states she understands and agrees and consent was signed on the chart.  DESCRIPTION OF PROCEDURE:  The patient was taken to the operating room where she was identified, placed in the dorsal supine position and general anesthesia was induced via LMA.  She was placed in the dorsal lithotomy position.  Timeout was undertaken.   She was prepped vaginally with Betadine.  Bladder was straight catheterized and she was draped in a sterile fashion.  The posterior vaginal mucosa was injected with 1% lidocaine with 1:250,000 epinephrine.  A small linear wedge of mucosa was removed at  the posterior perineal body and the posterior vaginal mucosa was taken down in the midline to  approximately two-thirds of the way up the vagina.  The posterior vaginal mucosa was then dissected from the underlying rectovaginal tissue sharply and bluntly  bilaterally.  Dissection was carried out to the ischial spine and the sacrospinous ligament was identified.  A Capio was used to place a single Vicryl stitch at least 2 fingerbreadths medial to the ischial spine on the first attempt without difficulty.   A free needle was then used to anchor this to the vaginal vault close to the apex.  This was then held.  The rectovaginal fascia was then reapproximated in the midline with interrupted 0 Monocryl suture.  This creates a good supporting shelf, but does  not overly reduce the caliber of the vagina.  A small amount of excess vaginal mucosa was trimmed and 2-0 Vicryl running suture was used in a running locking fashion to close the upper posterior half of the posterior vaginal mucosa.  This was then held  and the sacrospinous ligature was then tied down, elevating the apex of the vagina well.  The remainder of the vaginal mucosa was then closed.  The anterior vagina has adequate support at this point given the support for the vaginal apex.  Therefore, the  anterior repair was not done.  Foley catheter was placed and clear urine was noted.  Plain vaginal packing was placed in the vagina.  The patient was awakened and taken to the recovery room in stable condition.  VN/NUANCE  D:10/06/2019 T:10/06/2019 JOB:[id number]

## 2019-10-06 NOTE — Transfer of Care (Signed)
Immediate Anesthesia Transfer of Care Note  Patient: [name]  Procedure(s) Performed: POSTERIOR REPAIR WITH SACROSPINOUS FIXATION (N/A Vagina )  Patient Location: PACU  Anesthesia Type:General  Level of Consciousness: drowsy  Airway & Oxygen Therapy: Patient Spontanous Breathing and Patient connected to nasal cannula oxygen  Post-op Assessment: Report given to RN  Post vital signs: Reviewed and stable  Last Vitals:  Vitals Value Taken Time  BP 128/78 10/06/19 0831  Temp    Pulse 77 10/06/19 0833  Resp 14 10/06/19 0833  SpO2 90 % 10/06/19 0833  Vitals shown include unvalidated device data.  Last Pain:  Vitals:   10/06/19 0536  TempSrc: Oral  PainSc: 0-No pain      Patients Stated Pain Goal: 5 (57/01/77 9390)  Complications: No complications documented.

## 2019-10-06 NOTE — Anesthesia Preprocedure Evaluation (Signed)
Anesthesia Evaluation  Patient identified by MRN, date of birth, ID band Patient awake    Reviewed: Allergy & Precautions, NPO status , Patient's Chart, lab work & pertinent test results  History of Anesthesia Complications (+) PONV  Airway Mallampati: I       Dental no notable dental hx. (+) Teeth Intact   Pulmonary asthma , former smoker,    Pulmonary exam normal breath sounds clear to auscultation       Cardiovascular Normal cardiovascular exam Rhythm:Regular Rate:Normal     Neuro/Psych PSYCHIATRIC DISORDERS Anxiety Depression    GI/Hepatic GERD  Medicated and Controlled,  Endo/Other  Hypothyroidism   Renal/GU      Musculoskeletal   Abdominal (+) + obese,   Peds  Hematology   Anesthesia Other Findings   Reproductive/Obstetrics                             Anesthesia Physical Anesthesia Plan  ASA: II  Anesthesia Plan: General   Post-op Pain Management:    Induction: Intravenous  PONV Risk Score and Plan: 4 or greater and Ondansetron, Dexamethasone, Scopolamine patch - Pre-op and Midazolam  Airway Management Planned: LMA  Additional Equipment: None  Intra-op Plan:   Post-operative Plan: Extubation in OR  Informed Consent: I have reviewed the patients History and Physical, chart, labs and discussed the procedure including the risks, benefits and alternatives for the proposed anesthesia with the patient or authorized representative who has indicated his/her understanding and acceptance.     Dental advisory given  Plan Discussed with: CRNA  Anesthesia Plan Comments:         Anesthesia Quick Evaluation

## 2019-10-07 ENCOUNTER — Observation Stay (HOSPITAL_COMMUNITY): Payer: 59

## 2019-10-07 ENCOUNTER — Encounter (HOSPITAL_BASED_OUTPATIENT_CLINIC_OR_DEPARTMENT_OTHER): Payer: Self-pay | Admitting: Obstetrics and Gynecology

## 2019-10-07 DIAGNOSIS — I2510 Atherosclerotic heart disease of native coronary artery without angina pectoris: Secondary | ICD-10-CM | POA: Diagnosis not present

## 2019-10-07 DIAGNOSIS — M1990 Unspecified osteoarthritis, unspecified site: Secondary | ICD-10-CM | POA: Diagnosis not present

## 2019-10-07 DIAGNOSIS — R102 Pelvic and perineal pain: Secondary | ICD-10-CM | POA: Diagnosis not present

## 2019-10-07 DIAGNOSIS — K219 Gastro-esophageal reflux disease without esophagitis: Secondary | ICD-10-CM | POA: Diagnosis not present

## 2019-10-07 DIAGNOSIS — K5790 Diverticulosis of intestine, part unspecified, without perforation or abscess without bleeding: Secondary | ICD-10-CM | POA: Diagnosis not present

## 2019-10-07 DIAGNOSIS — E039 Hypothyroidism, unspecified: Secondary | ICD-10-CM | POA: Diagnosis not present

## 2019-10-07 DIAGNOSIS — Z87891 Personal history of nicotine dependence: Secondary | ICD-10-CM | POA: Diagnosis not present

## 2019-10-07 DIAGNOSIS — E785 Hyperlipidemia, unspecified: Secondary | ICD-10-CM | POA: Diagnosis not present

## 2019-10-07 DIAGNOSIS — K5730 Diverticulosis of large intestine without perforation or abscess without bleeding: Secondary | ICD-10-CM | POA: Diagnosis not present

## 2019-10-07 DIAGNOSIS — J45909 Unspecified asthma, uncomplicated: Secondary | ICD-10-CM | POA: Diagnosis not present

## 2019-10-07 DIAGNOSIS — N8189 Other female genital prolapse: Secondary | ICD-10-CM | POA: Diagnosis not present

## 2019-10-07 LAB — CBC
HCT: 34.2 % — ABNORMAL LOW (ref 36.0–46.0)
Hemoglobin: 11.6 g/dL — ABNORMAL LOW (ref 12.0–15.0)
MCH: 31.7 pg (ref 26.0–34.0)
MCHC: 33.9 g/dL (ref 30.0–36.0)
MCV: 93.4 fL (ref 80.0–100.0)
Platelets: 175 10*3/uL (ref 150–400)
RBC: 3.66 MIL/uL — ABNORMAL LOW (ref 3.87–5.11)
RDW: 12 % (ref 11.5–15.5)
WBC: 11.1 10*3/uL — ABNORMAL HIGH (ref 4.0–10.5)
nRBC: 0 % (ref 0.0–0.2)

## 2019-10-07 MED ORDER — IOHEXOL 9 MG/ML PO SOLN
500.0000 mL | ORAL | Status: AC
  Administered 2019-10-07: 500 mL via ORAL

## 2019-10-07 MED ORDER — HEPARIN SODIUM (PORCINE) 5000 UNIT/ML IJ SOLN
INTRAMUSCULAR | Status: AC
  Filled 2019-10-07: qty 1

## 2019-10-07 MED ORDER — IOHEXOL 9 MG/ML PO SOLN
ORAL | Status: AC
  Filled 2019-10-07: qty 1000

## 2019-10-07 MED ORDER — OXYCODONE HCL 5 MG PO TABS
ORAL_TABLET | ORAL | Status: AC
  Filled 2019-10-07: qty 1

## 2019-10-07 MED ORDER — PHENAZOPYRIDINE HCL 100 MG PO TABS
100.0000 mg | ORAL_TABLET | Freq: Three times a day (TID) | ORAL | Status: DC
  Administered 2019-10-08: 100 mg via ORAL
  Filled 2019-10-07 (×2): qty 1

## 2019-10-07 MED ORDER — IOHEXOL 300 MG/ML  SOLN
100.0000 mL | Freq: Once | INTRAMUSCULAR | Status: AC | PRN
  Administered 2019-10-07: 100 mL via INTRAVENOUS

## 2019-10-07 MED ORDER — IBUPROFEN 800 MG PO TABS: 800.0000 mg | ORAL_TABLET | Freq: Three times a day (TID) | ORAL | Status: DC | PRN

## 2019-10-07 MED ORDER — KETOROLAC TROMETHAMINE 15 MG/ML IJ SOLN
15.0000 mg | Freq: Four times a day (QID) | INTRAMUSCULAR | Status: DC | PRN
  Administered 2019-10-07 – 2019-10-08 (×2): 15 mg via INTRAVENOUS
  Filled 2019-10-07 (×3): qty 1

## 2019-10-07 MED ORDER — MAGNESIUM HYDROXIDE 400 MG/5ML PO SUSP
30.0000 mL | Freq: Every day | ORAL | Status: DC
  Administered 2019-10-07 – 2019-10-08 (×2): 30 mL via ORAL
  Filled 2019-10-07 (×2): qty 30

## 2019-10-07 MED ORDER — SODIUM CHLORIDE (PF) 0.9 % IJ SOLN
INTRAMUSCULAR | Status: AC
  Filled 2019-10-07: qty 50

## 2019-10-07 MED ORDER — PANTOPRAZOLE SODIUM 40 MG PO TBEC
DELAYED_RELEASE_TABLET | ORAL | Status: AC
  Filled 2019-10-07: qty 1

## 2019-10-07 MED ORDER — OXYCODONE HCL 5 MG PO TABS: 10.0000 mg | ORAL_TABLET | Freq: Four times a day (QID) | ORAL | Status: DC | PRN

## 2019-10-07 MED ORDER — HYDROMORPHONE HCL 1 MG/ML IJ SOLN
INTRAMUSCULAR | Status: AC
  Filled 2019-10-07: qty 1

## 2019-10-07 MED ORDER — METHOCARBAMOL 500 MG PO TABS
ORAL_TABLET | ORAL | Status: AC
  Filled 2019-10-07: qty 1

## 2019-10-07 NOTE — Progress Notes (Signed)
Central pelvic and rectal pain continues. Can walk only with assistance Voiding well. Tolerating regular diet well. No trouble swallowing. No N/V.  Today's Vitals   10/06/19 2200 10/07/19 0030 10/07/19 0230 10/07/19 0513  BP: 112/65  123/75 115/66  Pulse: 70  84 74  Resp: 18  18 18   Temp: 97.9 F (36.6 C)  (!) 97.5 F (36.4 C) 97.6 F (36.4 C)  TempSrc:      SpO2: 95%  94% 94%  Weight:      Height:      PainSc: 5  Asleep 5  4    Body mass index is 36.92 kg/m.   Results for orders placed or performed during the hospital encounter of 10/06/19 (from the past 24 hour(s))  Hemoglobin and hematocrit, blood     Status: None   Collection Time: 10/06/19  4:26 PM  Result Value Ref Range   Hemoglobin 12.4 12.0 - 15.0 g/dL   HCT 36.2 36 - 46 %  CBC     Status: Abnormal   Collection Time: 10/07/19  6:07 AM  Result Value Ref Range   WBC 11.1 (H) 4.0 - 10.5 K/uL   RBC 3.66 (L) 3.87 - 5.11 MIL/uL   Hemoglobin 11.6 (L) 12.0 - 15.0 g/dL   HCT 34.2 (L) 36 - 46 %   MCV 93.4 80.0 - 100.0 fL   MCH 31.7 26.0 - 34.0 pg   MCHC 33.9 30.0 - 36.0 g/dL   RDW 12.0 11.5 - 15.5 %   Platelets 175 150 - 400 K/uL   nRBC 0.0 0.0 - 0.2 %   Abdomen soft, NT LE NT  A/P: S/P posterior vaginal repair with SSLS         Her pain continues. I do not think she is ready for discharge.         Will get CT to R/O hematoma          D/W patient and husband. They agree.         Will hold heparin and continue PAS

## 2019-10-07 NOTE — Progress Notes (Signed)
CT>normal post op changes  She takes ibuprofen at home without side effects She just received Toradol 15mg  IV Was taking Azo at home to reduce burning on urination.She has sharp vaginal pain with voiding now.  D/W patient and husband plan>          1-ibuprofen scheduled starting 6 hours after toradol           2-tylenol, methocarbamol and oxycodone prn           3- Azo            4-Miralax

## 2019-10-07 NOTE — Progress Notes (Signed)
Vaginal packing and foley catheter removed this morning around 0500am.

## 2019-10-07 NOTE — Progress Notes (Signed)
Still C/O pain Just received a dose of dilaudid She finished PO contrast  Today's Vitals   10/07/19 0513 10/07/19 0900 10/07/19 0908 10/07/19 1109  BP: 115/66  107/66 (!) 103/49  Pulse: 74  73 66  Resp: 18  16 18   Temp: 97.6 F (36.4 C)  (!) 97.5 F (36.4 C) 97.6 F (36.4 C)  TempSrc:    Oral  SpO2: 94%  96% 95%  Weight:      Height:      PainSc: 4  6      Body mass index is 36.92 kg/m.   Radiology says her CT will be done in 1 hour

## 2019-10-07 NOTE — Progress Notes (Signed)
Report called to Burman Nieves, RN on [address]. Patient transferred via stretcher to [address]. VSS. Husband at bedside.

## 2019-10-08 DIAGNOSIS — K219 Gastro-esophageal reflux disease without esophagitis: Secondary | ICD-10-CM | POA: Diagnosis not present

## 2019-10-08 DIAGNOSIS — K5730 Diverticulosis of large intestine without perforation or abscess without bleeding: Secondary | ICD-10-CM | POA: Diagnosis not present

## 2019-10-08 DIAGNOSIS — I2510 Atherosclerotic heart disease of native coronary artery without angina pectoris: Secondary | ICD-10-CM | POA: Diagnosis not present

## 2019-10-08 DIAGNOSIS — E785 Hyperlipidemia, unspecified: Secondary | ICD-10-CM | POA: Diagnosis not present

## 2019-10-08 DIAGNOSIS — J45909 Unspecified asthma, uncomplicated: Secondary | ICD-10-CM | POA: Diagnosis not present

## 2019-10-08 DIAGNOSIS — Z87891 Personal history of nicotine dependence: Secondary | ICD-10-CM | POA: Diagnosis not present

## 2019-10-08 DIAGNOSIS — E039 Hypothyroidism, unspecified: Secondary | ICD-10-CM | POA: Diagnosis not present

## 2019-10-08 DIAGNOSIS — M1990 Unspecified osteoarthritis, unspecified site: Secondary | ICD-10-CM | POA: Diagnosis not present

## 2019-10-08 DIAGNOSIS — N8189 Other female genital prolapse: Secondary | ICD-10-CM | POA: Diagnosis not present

## 2019-10-08 MED ORDER — METHOCARBAMOL 500 MG PO TABS: 500.0000 mg | ORAL_TABLET | Freq: Four times a day (QID) | ORAL | 0 refills | Status: DC | PRN

## 2019-10-08 MED ORDER — POLYETHYLENE GLYCOL 3350 17 G PO PACK: 17.0000 g | PACK | Freq: Every day | ORAL | 0 refills | Status: DC

## 2019-10-08 MED ORDER — OXYCODONE HCL 10 MG PO TABS: 10.0000 mg | ORAL_TABLET | Freq: Four times a day (QID) | ORAL | 0 refills | Status: DC | PRN

## 2019-10-08 MED ORDER — IBUPROFEN 800 MG PO TABS: 800.0000 mg | ORAL_TABLET | Freq: Three times a day (TID) | ORAL | 0 refills | Status: DC | PRN

## 2019-10-08 MED FILL — METHOCARBAMOL 500 MG TABS: 500 | 7 days supply | Qty: 30 | Fill #0

## 2019-10-08 MED FILL — IBUPROFEN 800 MG TAB: 800 | 20 days supply | Qty: 60 | Fill #0

## 2019-10-08 MED FILL — POLYETHYLENE GLYCOL 3350 PO: 17 | 14 days supply | Qty: 238 | Fill #0

## 2019-10-08 NOTE — Progress Notes (Signed)
Doing better. Still has pain but ambulating a little better, voiding, BM this am, resting better  Today's Vitals   10/07/19 2004 10/07/19 2300 10/08/19 0609 10/08/19 0825  BP:  108/62 114/75   Pulse:  86 75   Resp:  17 18   Temp:  97.8 F (36.6 C) 97.6 F (36.4 C)   TempSrc:  Oral Oral   SpO2:  (!) 89% 91%   Weight:      Height:      PainSc: 5    3    Body mass index is 36.92 kg/m.   Patient smiling Pelvic exam (nurse chaperone) 1 finger exam>suture line intact, no masses LE TED hose on  A/P: D/C home         Ibuprofen 800mg  po q8hours x 2-3 days, then prn         Tylenol prn          Methocarbamol 500mg  q 6 hours prn         Oxycodone 10mg  po q6hours prn         Continue home meds including amitriptyline         D/C instructions and medication instructions reviewed         Mirilax qd         FU office as scheduled 1-2 weeks

## 2019-10-08 NOTE — Discharge Summary (Signed)
Physician Discharge Summary  Patient ID: [name] MRN: [id number] DOB/AGE: 11-02-1955 64 y.o.  Admit date: 10/06/2019 Discharge date: 10/08/2019  Admission Diagnoses:Pelvic prolapse  Discharge Diagnoses:  Active Problems:   Pelvic prolapse   Discharged Condition: good  Hospital Course: Admitted for surgery. She had posterior vaginal repair with SSLS. Post op noted pain, especially when on her feet. Medication changed to achieve pain control. Vital signs remained stable and Hgb stable. She was transferred to main floor of hospital on D1. Pelvic CT noted normal post op changes. Ibuprofen, tylenol, methocarbamol, IV Toradol, oxycodone, resumption of pre op Azo, and continuation of amiitryptiline were used. Upon discharge she is ambulating better, voiding and had BM. Pelvic check noted intact suture line and no masses.  Consults: None  Significant Diagnostic Studies: labs: No results found for this or any previous visit (from the past 24 hour(s)). and radiology: CT scan: normal post op changes  Treatments: IV hydration, analgesia: acetaminophen, Dilaudid and methocarbamol, oxycodone and surgery: posterior vaginal repair and SSLS  Discharge Exam: Blood pressure 114/75, pulse 75, temperature 97.6 F (36.4 C), temperature source Oral, resp. rate 18, height 5\' 3"  (1.6 m), weight 94.5 kg, SpO2 91 %. General appearance: alert, cooperative and no distress GI: soft, non-tender; bowel sounds normal; no masses,  no organomegaly  Disposition: Discharge disposition: 01-Home or Self Care       Discharge Instructions    Discharge patient   Complete by: As directed    Discharge disposition: 01-Home or Self Care   Discharge patient date: 10/08/2019     Allergies as of 10/08/2019      Reactions   Amoxicillin Shortness Of Breath, Rash   REACTION: rash, sob - tol kelfex and rocephn hosp 06/2009 Has patient had a PCN reaction causing immediate rash, facial/tongue/throat swelling, SOB or  lightheadedness with hypotension: YES Has patient had a PCN reaction causing severe rash involving mucus membranes or skin necrosis: YES Has patient had a PCN reaction that required hospitalization UNKNOWN Has patient had a PCN reaction occurring within the last 10 years: NO If all of the above answers are "NO", then may proceed with Cephalosporin use   Aspirin Shortness Of Breath, Rash   Tolerates ibuprofen without issue   Latex Shortness Of Breath, Dermatitis   Blisters on skin   Sulfa Antibiotics Shortness Of Breath, Rash   But reports bactrim tolerence   Avelox [moxifloxacin Hcl In Nacl] Hives   TOLERATES CIPRO      Medication List    TAKE these medications   acetaminophen 500 MG tablet Commonly known as: TYLENOL Take 500 mg every 6 (six) hours as needed by mouth for moderate pain.   ALPRAZolam 0.5 MG tablet Commonly known as: XANAX Take 0.5 mg by mouth at bedtime as needed for anxiety.   amitriptyline 50 MG tablet Commonly known as: ELAVIL TAKE 1 TABLET BY MOUTH AT BEDTIME.   cholecalciferol 25 MCG (1000 UNIT) tablet Commonly known as: VITAMIN D3 Take 25 mcg by mouth daily.   cyanocobalamin 1000 MCG tablet Take 1,000 mcg by mouth daily.   fluticasone 50 MCG/ACT nasal spray Commonly known as: FLONASE Place 2 sprays into both nostrils daily for 10 days. What changed:   when to take this  reasons to take this   Fluticasone-Salmeterol 250-50 MCG/DOSE Aepb Commonly known as: Advair Diskus Inhale 1 puff into the lungs 2 (two) times daily. What changed:   when to take this  additional instructions   furosemide 40 MG tablet Commonly  known as: Lasix Take 1 tablet (40 mg total) by mouth daily. What changed:   when to take this  reasons to take this   ibuprofen 800 MG tablet Commonly known as: ADVIL Take 1 tablet (800 mg total) by mouth every 8 (eight) hours as needed for moderate pain. What changed:   medication strength  how much to take  when to  take this  reasons to take this   levothyroxine 125 MCG tablet Commonly known as: SYNTHROID Take 1 tablet (125 mcg total) by mouth every other day.   levothyroxine 150 MCG tablet Commonly known as: SYNTHROID Take 1 tablet (150 mcg total) by mouth every other day.   loratadine 10 MG tablet Commonly known as: CLARITIN Take 10 mg by mouth daily.   methocarbamol 500 MG tablet Commonly known as: ROBAXIN Take 1 tablet (500 mg total) by mouth every 6 (six) hours as needed for muscle spasms (prn moderate pain).   Oxycodone HCl 10 MG Tabs Take 1 tablet (10 mg total) by mouth every 6 (six) hours as needed for severe pain.   pantoprazole 40 MG tablet Commonly known as: PROTONIX TAKE 1 TABLET BY MOUTH ONCE DAILY   polyethylene glycol 17 g packet Commonly known as: MIRALAX / GLYCOLAX Take 17 g by mouth daily. Start taking on: October 09, 2019   rosuvastatin 10 MG tablet Commonly known as: Crestor Take 1 tablet (10 mg total) by mouth daily. What changed: when to take this   Ventolin HFA 108 (90 Base) MCG/ACT inhaler Generic drug: albuterol INHALE 2 PUFFS BY MOUTH INTO THE LUNGS EVERY 6 HOURS AS NEEDED FOR WHEEZING OR SHORTNESS OF BREATH What changed: See the new instructions.        Signed: Shon Millet II 10/08/2019, 8:42 AM

## 2019-10-21 DIAGNOSIS — R399 Unspecified symptoms and signs involving the genitourinary system: Secondary | ICD-10-CM | POA: Diagnosis not present

## 2019-10-21 DIAGNOSIS — K5900 Constipation, unspecified: Secondary | ICD-10-CM | POA: Diagnosis not present

## 2019-10-21 DIAGNOSIS — Z09 Encounter for follow-up examination after completed treatment for conditions other than malignant neoplasm: Secondary | ICD-10-CM | POA: Diagnosis not present

## 2019-10-22 MED FILL — POLYETHYLENE GLYCOL 3350 PO: 17 | 28 days supply | Qty: 476 | Fill #0

## 2019-10-31 ENCOUNTER — Encounter (HOSPITAL_BASED_OUTPATIENT_CLINIC_OR_DEPARTMENT_OTHER): Payer: Self-pay | Admitting: Obstetrics and Gynecology

## 2019-11-07 MED FILL — PANTOPRAZOLE SOD DR 40 MG T: 40 | 90 days supply | Qty: 90 | Fill #2

## 2019-11-14 ENCOUNTER — Encounter: Payer: Self-pay | Admitting: Internal Medicine

## 2019-11-14 ENCOUNTER — Other Ambulatory Visit: Payer: Self-pay | Admitting: Internal Medicine

## 2019-11-14 ENCOUNTER — Ambulatory Visit: Payer: 59 | Admitting: Internal Medicine

## 2019-11-14 ENCOUNTER — Other Ambulatory Visit: Payer: Self-pay

## 2019-11-14 VITALS — BP 120/70 | Temp 98.2°F | Ht 63.0 in | Wt 208.0 lb

## 2019-11-14 DIAGNOSIS — R7302 Impaired glucose tolerance (oral): Secondary | ICD-10-CM | POA: Diagnosis not present

## 2019-11-14 DIAGNOSIS — E785 Hyperlipidemia, unspecified: Secondary | ICD-10-CM

## 2019-11-14 DIAGNOSIS — R079 Chest pain, unspecified: Secondary | ICD-10-CM

## 2019-11-14 DIAGNOSIS — F411 Generalized anxiety disorder: Secondary | ICD-10-CM | POA: Diagnosis not present

## 2019-11-14 DIAGNOSIS — E039 Hypothyroidism, unspecified: Secondary | ICD-10-CM | POA: Diagnosis not present

## 2019-11-14 DIAGNOSIS — G4700 Insomnia, unspecified: Secondary | ICD-10-CM | POA: Diagnosis not present

## 2019-11-14 MED ORDER — ZOLPIDEM TARTRATE 10 MG PO TABS: 10.0000 mg | ORAL_TABLET | Freq: Every evening | ORAL | 5 refills | Status: DC | PRN

## 2019-11-14 MED FILL — ROSUVASTATIN CALCIUM 10 MG: 10 | 90 days supply | Qty: 90 | Fill #0

## 2019-11-14 MED FILL — ZOLPIDEM TARTRATE 10 MG TAB: 10 | 30 days supply | Qty: 30 | Fill #0

## 2019-11-14 NOTE — Patient Instructions (Signed)
Your EKG was Ok today  Please take all new medication as prescribed - the [place] as needed for sleep  Ok to restart the crestor  Please continue all other medications as before, and refills have been done if requested.  Please have the pharmacy call with any other refills you may need.  Please continue your efforts at being more active, low cholesterol diet, and weight control.  Please keep your appointments with your specialists as you may have planned  Please make an Appointment to return in 6 months, or sooner if needed

## 2019-11-14 NOTE — Assessment & Plan Note (Signed)
Leg cramps improved, ok to retry the crestor
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
## 2019-11-14 NOTE — Assessment & Plan Note (Addendum)
Atypical, ecg reviewed, ? msk , very low suspicion for cardiac,  to f/u any worsening symptoms or concerns  I spent 31 minutes in preparing to see the patient by review of recent labs, imaging and procedures, obtaining and reviewing separately obtained history, communicating with the patient and family or caregiver, ordering medications, tests or procedures, and documenting clinical information in the EHR including the differential Dx, treatment, and any further evaluation and other management of cp, insomnia, hyperglycmeia, hypothryoidism, hld, anxiety

## 2019-11-14 NOTE — Telephone Encounter (Signed)
1.Medication Requested:rosuvastatin (CRESTOR) 10 MG tablet  2. Pharmacy (Name, Street, [place]):[facility], [place]  3. On Med List: Yes    4. Last Visit with PCP: 7.23.21   5. Next visit date with PCP: n/a   Agent: Please be advised that RX refills may take up to 3 business days. We ask that you follow-up with your pharmacy.

## 2019-11-14 NOTE — Assessment & Plan Note (Signed)
Mild to mod, for ambien qhs prn,  to f/u any worsening symptoms or concerns  

## 2019-11-14 NOTE — Telephone Encounter (Signed)
Please refill as per office routine med refill policy (all routine meds refilled for 3 mo or monthly per pt preference up to one year from last visit, then month to month grace period for 3 mo, then further med refills will have to be denied)  

## 2019-11-15 ENCOUNTER — Encounter: Payer: Self-pay | Admitting: Internal Medicine

## 2019-11-15 NOTE — Assessment & Plan Note (Signed)
Ongoing at least moderate chronic, reassured, o/w cont same tx

## 2019-11-15 NOTE — Assessment & Plan Note (Signed)
stable overall by history and exam, recent data reviewed with pt, and pt to continue medical treatment as before,  to f/u any worsening symptoms or concerns  

## 2019-11-20 NOTE — Addendum Note (Signed)
Addended by: Marijean Heath R on: 11/20/2019 01:08 PM   Modules accepted: Orders

## 2019-11-25 MED FILL — POLYETHYLENE GLYCOL 3350 PO: 17 | 28 days supply | Qty: 476 | Fill #1

## 2019-11-26 DIAGNOSIS — H524 Presbyopia: Secondary | ICD-10-CM | POA: Diagnosis not present

## 2019-11-26 DIAGNOSIS — H5211 Myopia, right eye: Secondary | ICD-10-CM | POA: Diagnosis not present

## 2019-11-26 DIAGNOSIS — H5202 Hypermetropia, left eye: Secondary | ICD-10-CM | POA: Diagnosis not present

## 2019-12-08 MED FILL — LEVOTHYROXINE 150 MCG TAB: 150 | 90 days supply | Qty: 45 | Fill #0

## 2019-12-08 MED FILL — POLYETHYLENE GLYCOL 3350 PO: 17 | 28 days supply | Qty: 476 | Fill #1

## 2019-12-08 MED FILL — LEVOTHYROXINE 125 MCG TABLE: 125 | 90 days supply | Qty: 45 | Fill #0

## 2019-12-17 MED FILL — ZOLPIDEM TARTRATE 10 MG TAB: 10 | 30 days supply | Qty: 30 | Fill #1

## 2019-12-23 DIAGNOSIS — R300 Dysuria: Secondary | ICD-10-CM | POA: Diagnosis not present

## 2019-12-23 DIAGNOSIS — R319 Hematuria, unspecified: Secondary | ICD-10-CM | POA: Diagnosis not present

## 2019-12-23 DIAGNOSIS — R309 Painful micturition, unspecified: Secondary | ICD-10-CM | POA: Diagnosis not present

## 2019-12-23 MED FILL — NITROFURANTOIN MONO-MCR 100: 100 | 5 days supply | Qty: 10 | Fill #0

## 2020-01-07 ENCOUNTER — Other Ambulatory Visit (HOSPITAL_COMMUNITY): Payer: Self-pay | Admitting: Obstetrics and Gynecology

## 2020-01-07 DIAGNOSIS — L900 Lichen sclerosus et atrophicus: Secondary | ICD-10-CM | POA: Diagnosis not present

## 2020-01-07 DIAGNOSIS — R300 Dysuria: Secondary | ICD-10-CM | POA: Diagnosis not present

## 2020-01-07 DIAGNOSIS — N952 Postmenopausal atrophic vaginitis: Secondary | ICD-10-CM | POA: Diagnosis not present

## 2020-01-07 DIAGNOSIS — R319 Hematuria, unspecified: Secondary | ICD-10-CM | POA: Diagnosis not present

## 2020-01-07 MED FILL — LIDOCAINE 5% OINTMENT: 5 | 15 days supply | Qty: 30 | Fill #0

## 2020-01-07 MED FILL — BETAMETHASONE VALERATE 0.1: 0.1 | 30 days supply | Qty: 45 | Fill #0

## 2020-01-14 ENCOUNTER — Encounter: Payer: Self-pay | Admitting: Internal Medicine

## 2020-01-21 ENCOUNTER — Encounter: Payer: Self-pay | Admitting: Internal Medicine

## 2020-01-22 MED FILL — ZOLPIDEM TARTRATE 10 MG TAB: 10 | 30 days supply | Qty: 30 | Fill #2

## 2020-01-23 MED ORDER — PHENTERMINE HCL 37.5 MG PO CAPS
37.5000 mg | ORAL_CAPSULE | ORAL | 2 refills | Status: DC
Start: 2020-01-23 — End: 2020-07-08

## 2020-01-23 MED FILL — PHENTERMINE 37.5 MG CAPSULE: 37.5 | 30 days supply | Qty: 30 | Fill #0

## 2020-01-26 MED FILL — ALBUTEROL SULFATE HFA 108 (: 108 (90 BAS | 25 days supply | Qty: 18 | Fill #1

## 2020-02-04 MED FILL — PANTOPRAZOLE SOD DR 40 MG T: 40 | 90 days supply | Qty: 90 | Fill #3

## 2020-03-11 MED FILL — LEVOTHYROXINE 150 MCG TAB: 150 | 90 days supply | Qty: 45 | Fill #1

## 2020-03-11 MED FILL — ROSUVASTATIN CALCIUM 10 MG: 10 | 90 days supply | Qty: 90 | Fill #1

## 2020-03-11 MED FILL — LEVOTHYROXINE 125 MCG TABLE: 125 | 90 days supply | Qty: 45 | Fill #1

## 2020-03-28 ENCOUNTER — Ambulatory Visit (HOSPITAL_COMMUNITY)
Admission: RE | Admit: 2020-03-28 | Discharge: 2020-03-28 | Disposition: A | Discharge status: Home / Self Care | Payer: 59 | Source: Ambulatory Visit

## 2020-03-28 ENCOUNTER — Other Ambulatory Visit: Payer: Self-pay

## 2020-03-28 ENCOUNTER — Encounter (HOSPITAL_COMMUNITY): Payer: Self-pay

## 2020-03-28 VITALS — BP 125/59 | HR 80 | Temp 97.4°F | Resp 18

## 2020-03-28 DIAGNOSIS — W57XXXA Bitten or stung by nonvenomous insect and other nonvenomous arthropods, initial encounter: Secondary | ICD-10-CM

## 2020-03-28 DIAGNOSIS — R21 Rash and other nonspecific skin eruption: Secondary | ICD-10-CM | POA: Diagnosis not present

## 2020-03-28 MED ORDER — METHYLPREDNISOLONE SODIUM SUCC 125 MG IJ SOLR
80.0000 mg | Freq: Once | INTRAMUSCULAR | Status: AC
  Administered 2020-03-28: 80 mg via INTRAMUSCULAR

## 2020-03-28 MED ORDER — METHYLPREDNISOLONE SODIUM SUCC 125 MG IJ SOLR
INTRAMUSCULAR | Status: AC
  Filled 2020-03-28: qty 2

## 2020-03-28 MED ORDER — PREDNISONE 10 MG (21) PO TBPK
ORAL_TABLET | Freq: Every day | ORAL | 0 refills | Status: DC
Start: 2020-03-29 — End: 2020-04-30

## 2020-03-28 NOTE — Discharge Instructions (Signed)
You were given an injection of a steroid called Solu-Medrol.  Start the prednisone taper tomorrow as directed.    Continue taking Zyrtec and using hydrocortisone cream as needed.    Follow up with your primary care provider if your symptoms are not improving.
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
## 2020-03-28 NOTE — ED Triage Notes (Addendum)
C/O sporadic, pruritic lesions to torso x 3 days; pt states caused by "gnats".  States "they have me going so crazy that my allergies/sinuses are flaring up, and I'm taking loratadine".

## 2020-03-29 MED FILL — BETAMETHASONE VALERATE 0.1: 0.1 | 30 days supply | Qty: 45 | Fill #1

## 2020-03-29 MED FILL — ZOLPIDEM TARTRATE 10 MG TAB: 10 | 30 days supply | Qty: 30 | Fill #4

## 2020-03-29 MED FILL — ALBUTEROL SULFATE HFA 108 (: 108 (90 BAS | 25 days supply | Qty: 18 | Fill #2

## 2020-03-29 MED FILL — LIDOCAINE 5% OINTMENT: 5 | 15 days supply | Qty: 30 | Fill #1

## 2020-03-30 ENCOUNTER — Encounter: Payer: Self-pay | Admitting: Internal Medicine

## 2020-03-30 ENCOUNTER — Other Ambulatory Visit: Payer: Self-pay

## 2020-03-30 ENCOUNTER — Ambulatory Visit: Payer: 59 | Admitting: Internal Medicine

## 2020-03-30 VITALS — BP 120/72 | HR 79 | Temp 97.7°F | Ht 63.0 in | Wt 196.0 lb

## 2020-03-30 DIAGNOSIS — F411 Generalized anxiety disorder: Secondary | ICD-10-CM

## 2020-03-30 DIAGNOSIS — R7302 Impaired glucose tolerance (oral): Secondary | ICD-10-CM

## 2020-03-30 DIAGNOSIS — J4531 Mild persistent asthma with (acute) exacerbation: Secondary | ICD-10-CM | POA: Diagnosis not present

## 2020-03-30 DIAGNOSIS — R21 Rash and other nonspecific skin eruption: Secondary | ICD-10-CM

## 2020-03-30 MED ORDER — METHYLPREDNISOLONE ACETATE 80 MG/ML IJ SUSP
80.0000 mg | Freq: Once | INTRAMUSCULAR | Status: AC
  Administered 2020-03-30: 80 mg via INTRAMUSCULAR
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
## 2020-03-30 NOTE — Patient Instructions (Signed)
You had the steroid shot today  Please continue all other medications as before, and refills have been done if requested.  Please have the pharmacy call with any other refills you may need.  Please continue your efforts at being more active, low cholesterol diet, and weight control.  Please keep your appointments with your specialists as you may have planned     

## 2020-03-31 ENCOUNTER — Other Ambulatory Visit: Payer: Self-pay | Admitting: Obstetrics and Gynecology

## 2020-03-31 DIAGNOSIS — Z1231 Encounter for screening mammogram for malignant neoplasm of breast: Secondary | ICD-10-CM

## 2020-04-03 ENCOUNTER — Encounter: Payer: Self-pay | Admitting: Internal Medicine

## 2020-04-03 DIAGNOSIS — R21 Rash and other nonspecific skin eruption: Secondary | ICD-10-CM | POA: Insufficient documentation

## 2020-04-03 NOTE — Assessment & Plan Note (Signed)
stable overall by history and exam, recent data reviewed with pt, and pt to continue medical treatment as before,  to f/u any worsening symptoms or concerns  

## 2020-04-03 NOTE — Assessment & Plan Note (Addendum)
Etiology unclear, slow but mild improvement with recent steorid tx, for depomedrol im 80 mg repeat today,  to f/u any worsening symptoms or concerns  I spent 31 minutes in preparing to see the patient by review of recent labs, imaging and procedures, obtaining and reviewing separately obtained history, communicating with the patient and family or caregiver, ordering medications, tests or procedures, and documenting clinical information in the EHR including the differential Dx, treatment, and any further evaluation and other management of rash, hyperglycemia, asthma, anxiety

## 2020-04-13 ENCOUNTER — Other Ambulatory Visit: Payer: Self-pay | Admitting: Internal Medicine

## 2020-04-13 NOTE — Telephone Encounter (Signed)
Please refill as per office routine med refill policy (all routine meds refilled for 3 mo or monthly per pt preference up to one year from last visit, then month to month grace period for 3 mo, then further med refills will have to be denied)  

## 2020-04-16 MED FILL — PANTOPRAZOLE SOD DR 40 MG T: 40 | 90 days supply | Qty: 90 | Fill #0

## 2020-04-26 ENCOUNTER — Encounter: Payer: Self-pay | Admitting: Internal Medicine

## 2020-04-26 ENCOUNTER — Other Ambulatory Visit: Payer: 59

## 2020-04-30 ENCOUNTER — Encounter: Payer: Self-pay | Admitting: Family Medicine

## 2020-04-30 ENCOUNTER — Telehealth (INDEPENDENT_AMBULATORY_CARE_PROVIDER_SITE_OTHER): Payer: No Typology Code available for payment source | Admitting: Family Medicine

## 2020-04-30 ENCOUNTER — Other Ambulatory Visit: Payer: Self-pay

## 2020-04-30 DIAGNOSIS — J069 Acute upper respiratory infection, unspecified: Secondary | ICD-10-CM | POA: Diagnosis not present

## 2020-04-30 MED ORDER — PREDNISONE 20 MG PO TABS: 40.0000 mg | ORAL_TABLET | Freq: Every day | ORAL | 0 refills | Status: AC

## 2020-04-30 MED ORDER — ONDANSETRON 4 MG PO TBDP: 4.0000 mg | ORAL_TABLET | Freq: Three times a day (TID) | ORAL | 0 refills | Status: DC | PRN

## 2020-04-30 MED ORDER — PROMETHAZINE-DM 6.25-15 MG/5ML PO SYRP: 5.0000 mL | ORAL_SOLUTION | Freq: Four times a day (QID) | ORAL | 0 refills | Status: DC | PRN
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
## 2020-05-04 ENCOUNTER — Encounter: Payer: 59 | Admitting: Internal Medicine

## 2020-05-08 NOTE — Telephone Encounter (Signed)
[name] - can we reschedule this pt lab and physical appts for 2 wks later than currently scheduled?  thanks

## 2020-05-10 ENCOUNTER — Telehealth: Payer: Self-pay | Admitting: Internal Medicine

## 2020-05-10 ENCOUNTER — Other Ambulatory Visit: Payer: Self-pay | Admitting: Internal Medicine

## 2020-05-10 MED ORDER — ZOLPIDEM TARTRATE 10 MG PO TABS: 10.0000 mg | ORAL_TABLET | Freq: Every evening | ORAL | 1 refills | Status: DC | PRN

## 2020-05-10 NOTE — Addendum Note (Signed)
Addended by: Biagio Borg on: 05/10/2020 11:18 AM   Modules accepted: Orders

## 2020-05-10 NOTE — Telephone Encounter (Signed)
zolpidem (AMBIEN) 10 MG tablet  Just refilled, patient no longer has [facility] [employer],   [facility]  [id number] [address], [place] - [place] Phone:  [phone]  Fax:  [phone]

## 2020-05-13 ENCOUNTER — Other Ambulatory Visit: Payer: 59

## 2020-05-19 MED ORDER — ZOLPIDEM TARTRATE 10 MG PO TABS: 10.0000 mg | ORAL_TABLET | Freq: Every evening | ORAL | 1 refills | Status: DC | PRN

## 2020-05-19 NOTE — Telephone Encounter (Signed)
Pt notified that prescription was sent to pharmacy she requested.  Pt verb understanding.

## 2020-05-19 NOTE — Addendum Note (Signed)
Addended by: Biagio Borg on: 05/19/2020 01:01 PM   Modules accepted: Orders

## 2020-05-19 NOTE — Telephone Encounter (Signed)
Ok done

## 2020-05-20 ENCOUNTER — Encounter: Payer: 59 | Admitting: Internal Medicine

## 2020-05-24 ENCOUNTER — Telehealth: Payer: Self-pay | Admitting: Internal Medicine

## 2020-05-24 NOTE — Telephone Encounter (Signed)
Patient called to update insurance for 2022 to [facility] subscriber is [name] [id number]456 [facility])     Policy [id number]  Group# is [id number]

## 2020-05-25 NOTE — Telephone Encounter (Signed)
When pt called office she wanted to make sure the DOS 04/30/20 with Dr. Nani Ravens has [employer] insurance attached to the claim.  I am emailing [employer] asking them to please re-file claim with [employer] for this DOS.  I called and spoke with pt to let her know I am sending this over for resubmission.  Pt expressed appreciation.

## 2020-05-27 ENCOUNTER — Other Ambulatory Visit: Payer: Self-pay

## 2020-05-27 ENCOUNTER — Encounter: Payer: Self-pay | Admitting: Internal Medicine

## 2020-05-27 ENCOUNTER — Ambulatory Visit: Payer: No Typology Code available for payment source | Admitting: Internal Medicine

## 2020-05-27 ENCOUNTER — Other Ambulatory Visit (INDEPENDENT_AMBULATORY_CARE_PROVIDER_SITE_OTHER): Payer: No Typology Code available for payment source

## 2020-05-27 VITALS — BP 120/70 | HR 82 | Temp 98.2°F | Ht 63.0 in | Wt 193.0 lb

## 2020-05-27 DIAGNOSIS — Z0000 Encounter for general adult medical examination without abnormal findings: Secondary | ICD-10-CM

## 2020-05-27 DIAGNOSIS — L299 Pruritus, unspecified: Secondary | ICD-10-CM

## 2020-05-27 DIAGNOSIS — R7302 Impaired glucose tolerance (oral): Secondary | ICD-10-CM

## 2020-05-27 DIAGNOSIS — J4531 Mild persistent asthma with (acute) exacerbation: Secondary | ICD-10-CM

## 2020-05-27 LAB — URINALYSIS, ROUTINE W REFLEX MICROSCOPIC
Bilirubin Urine: NEGATIVE
Hgb urine dipstick: NEGATIVE
Ketones, ur: NEGATIVE
Leukocytes,Ua: NEGATIVE
Nitrite: NEGATIVE
RBC / HPF: NONE SEEN (ref 0–?)
Specific Gravity, Urine: 1.015 (ref 1.000–1.030)
Total Protein, Urine: NEGATIVE
Urine Glucose: NEGATIVE
Urobilinogen, UA: 0.2 (ref 0.0–1.0)
pH: 6 (ref 5.0–8.0)

## 2020-05-27 LAB — CBC WITH DIFFERENTIAL/PLATELET
Basophils Absolute: 0 10*3/uL (ref 0.0–0.1)
Basophils Relative: 0.9 % (ref 0.0–3.0)
Eosinophils Absolute: 0.5 10*3/uL (ref 0.0–0.7)
Eosinophils Relative: 10.7 % — ABNORMAL HIGH (ref 0.0–5.0)
HCT: 39.6 % (ref 36.0–46.0)
Hemoglobin: 13.7 g/dL (ref 12.0–15.0)
Lymphocytes Relative: 31 % (ref 12.0–46.0)
Lymphs Abs: 1.5 10*3/uL (ref 0.7–4.0)
MCHC: 34.7 g/dL (ref 30.0–36.0)
MCV: 90.8 fl (ref 78.0–100.0)
Monocytes Absolute: 0.4 10*3/uL (ref 0.1–1.0)
Monocytes Relative: 8.7 % (ref 3.0–12.0)
Neutro Abs: 2.4 10*3/uL (ref 1.4–7.7)
Neutrophils Relative %: 48.7 % (ref 43.0–77.0)
Platelets: 217 10*3/uL (ref 150.0–400.0)
RBC: 4.36 Mil/uL (ref 3.87–5.11)
RDW: 13.8 % (ref 11.5–15.5)
WBC: 5 10*3/uL (ref 4.0–10.5)

## 2020-05-27 LAB — HEPATIC FUNCTION PANEL
ALT: 20 U/L (ref 0–35)
AST: 21 U/L (ref 0–37)
Albumin: 3.9 g/dL (ref 3.5–5.2)
Alkaline Phosphatase: 63 U/L (ref 39–117)
Bilirubin, Direct: 0.1 mg/dL (ref 0.0–0.3)
Total Bilirubin: 0.5 mg/dL (ref 0.2–1.2)
Total Protein: 6.6 g/dL (ref 6.0–8.3)

## 2020-05-27 LAB — LIPID PANEL
Cholesterol: 93 mg/dL (ref 0–200)
HDL: 44.5 mg/dL (ref >39.00)
LDL Cholesterol: 36 mg/dL (ref 0–99)
NonHDL: 48.92
Total CHOL/HDL Ratio: 2
Triglycerides: 66 mg/dL (ref 0.0–149.0)
VLDL: 13.2 mg/dL (ref 0.0–40.0)

## 2020-05-27 LAB — BASIC METABOLIC PANEL
BUN: 12 mg/dL (ref 6–23)
CO2: 26 mEq/L (ref 19–32)
Calcium: 9.3 mg/dL (ref 8.4–10.5)
Chloride: 105 mEq/L (ref 96–112)
Creatinine, Ser: 0.73 mg/dL (ref 0.40–1.20)
GFR: 88.03 mL/min (ref >60.00)
Glucose, Bld: 93 mg/dL (ref 70–99)
Potassium: 3.5 mEq/L (ref 3.5–5.1)
Sodium: 139 mEq/L (ref 135–145)

## 2020-05-27 LAB — TSH: TSH: 0.01 u[IU]/mL — ABNORMAL LOW (ref 0.35–4.50)

## 2020-05-27 LAB — HEMOGLOBIN A1C: Hgb A1c MFr Bld: 5.8 % (ref 4.6–6.5)

## 2020-05-27 MED ORDER — PREDNISONE 10 MG PO TABS: ORAL_TABLET | ORAL | 0 refills | Status: DC

## 2020-05-27 MED ORDER — HYDROXYZINE HCL 10 MG PO TABS: 10.0000 mg | ORAL_TABLET | Freq: Three times a day (TID) | ORAL | 1 refills | Status: DC | PRN

## 2020-05-27 MED ORDER — METHYLPREDNISOLONE ACETATE 80 MG/ML IJ SUSP
80.0000 mg | Freq: Once | INTRAMUSCULAR | Status: AC
  Administered 2020-05-27: 80 mg via INTRAMUSCULAR
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
## 2020-05-27 NOTE — Patient Instructions (Signed)
You had the steroid shot today  Please take all new medication as prescribed - the prednisone, and the hydroxyzine as needed for itching  Please continue all other medications as before, and refills have been done if requested.  Please have the pharmacy call with any other refills you may need.  Please continue your efforts at being more active, low cholesterol diet, and weight control.  Please keep your appointments with your specialists as you may have planned  You will be contacted regarding the referral for: allergy

## 2020-05-30 DIAGNOSIS — L299 Pruritus, unspecified: Secondary | ICD-10-CM | POA: Insufficient documentation

## 2020-05-30 NOTE — Assessment & Plan Note (Signed)
Lab Results  Component Value Date   HGBA1C 5.8 05/27/2020   Stable, pt to continue current medical treatment  - diet  

## 2020-05-30 NOTE — Assessment & Plan Note (Signed)
Overall stable, to cont current tx - advair   Current Outpatient Medications (Endocrine & Metabolic):  .  levothyroxine (SYNTHROID) 125 MCG tablet, Take 1 tablet (125 mcg total) by mouth every other day. .  levothyroxine (SYNTHROID) 150 MCG tablet, Take 1 tablet (150 mcg total) by mouth every other day. .  predniSONE (DELTASONE) 10 MG tablet, 2 tabs by mouth per day for 5 days  Current Outpatient Medications (Cardiovascular):  .  furosemide (LASIX) 40 MG tablet, Take 1 tablet (40 mg total) by mouth daily. (Patient taking differently: Take 40 mg by mouth daily as needed for fluid or edema.) .  rosuvastatin (CRESTOR) 10 MG tablet, TAKE 1 TABLET BY MOUTH DAILY.  Current Outpatient Medications (Respiratory):  [name]  Fluticasone-Salmeterol (ADVAIR DISKUS) 250-50 MCG/DOSE AEPB, Inhale 1 puff into the lungs 2 (two) times daily. (Patient taking differently: Inhale 1 puff into the lungs See admin instructions. Pt has been using Ventolin as her daily inhaler not the Advair. SIG is 1 puff bid pt has been using as 1 puff prn for coughing/wheezing/emergency use) .  loratadine (CLARITIN) 10 MG tablet, Take 10 mg by mouth daily. .  promethazine-dextromethorphan (PROMETHAZINE-DM) 6.25-15 MG/5ML syrup, Take 5 mLs by mouth 4 (four) times daily as needed for cough. .  VENTOLIN HFA 108 (90 Base) MCG/ACT inhaler, INHALE 2 PUFFS BY MOUTH INTO THE LUNGS EVERY 6 HOURS AS NEEDED FOR WHEEZING OR SHORTNESS OF BREATH (Patient taking differently: Inhale 2 puffs into the lungs See admin instructions. Pt has been using Ventolin as her daily inhaler instead of the Advair. Original sig is 2 puffs q6hprn pt has been taking as 2 puffs bid.) .  fluticasone (FLONASE) 50 MCG/ACT nasal spray, Place 2 sprays into both nostrils daily for 10 days. (Patient taking differently: Place 2 sprays into both nostrils as needed. )  Current Outpatient Medications (Analgesics):  .  acetaminophen (TYLENOL) 500 MG tablet, Take 500 mg every 6 (six) hours  as needed by mouth for moderate pain.  [name]  ibuprofen (ADVIL) 800 MG tablet, Take 1 tablet (800 mg total) by mouth every 8 (eight) hours as needed for moderate pain. [name]  oxyCODONE 10 MG TABS, Take 1 tablet (10 mg total) by mouth every 6 (six) hours as needed for severe pain.  Current Outpatient Medications (Hematological):  .  cyanocobalamin 1000 MCG tablet, Take 1,000 mcg by mouth daily.  Current Outpatient Medications (Other):  [name]  ALPRAZolam (XANAX) 0.5 MG tablet, Take 0.5 mg by mouth at bedtime as needed for anxiety. [name]  amitriptyline (ELAVIL) 50 MG tablet, TAKE 1 TABLET BY MOUTH AT BEDTIME. (Patient taking differently: Take 50 mg by mouth at bedtime.) .  betamethasone valerate (VALISONE) 0.1 % cream, Apply topically as needed. .  cholecalciferol (VITAMIN D3) 25 MCG (1000 UT) tablet, Take by mouth once a week.  .  conjugated estrogens (PREMARIN) vaginal cream, conjugated estrogens 0.625 mg/gram vaginal cream  Insert 0.5 applicatorsful twice a week by vaginal route. .  hydrOXYzine (ATARAX/VISTARIL) 10 MG tablet, Take 1 tablet (10 mg total) by mouth 3 (three) times daily as needed. .  methocarbamol (ROBAXIN) 500 MG tablet, Take 1 tablet (500 mg total) by mouth every 6 (six) hours as needed for muscle spasms (prn moderate pain). .  ondansetron (ZOFRAN-ODT) 4 MG disintegrating tablet, Take 1 tablet (4 mg total) by mouth every 8 (eight) hours as needed for nausea or vomiting. .  pantoprazole (PROTONIX) 40 MG tablet, TAKE 1 TABLET BY MOUTH ONCE DAILY .  phentermine 37.5 MG  capsule, Take 1 capsule (37.5 mg total) by mouth every morning. .  polyethylene glycol (MIRALAX / GLYCOLAX) 17 g packet, Take 17 g by mouth daily. [name]  UNKNOWN TO PATIENT, estrogen .  zolpidem (AMBIEN) 10 MG tablet, Take 1 tablet (10 mg total) by mouth at bedtime as needed for sleep. [name]  lidocaine (XYLOCAINE) 5 % ointment, SMARTSIG:Topical 1-4 Times Daily PRN

## 2020-05-30 NOTE — Assessment & Plan Note (Signed)
I see no evidence for mites or other insect issue, most likely symptom due to allergy; for depomedrol IM 80, predpac asd, atarax prn, and refer allergy

## 2020-05-31 ENCOUNTER — Ambulatory Visit
Admission: RE | Admit: 2020-05-31 | Discharge: 2020-05-31 | Disposition: A | Discharge status: Home / Self Care | Payer: No Typology Code available for payment source | Source: Ambulatory Visit | Attending: Obstetrics and Gynecology | Admitting: Obstetrics and Gynecology

## 2020-05-31 ENCOUNTER — Other Ambulatory Visit: Payer: Self-pay

## 2020-05-31 DIAGNOSIS — Z1231 Encounter for screening mammogram for malignant neoplasm of breast: Secondary | ICD-10-CM

## 2020-06-02 ENCOUNTER — Other Ambulatory Visit: Payer: Self-pay

## 2020-06-03 ENCOUNTER — Ambulatory Visit (INDEPENDENT_AMBULATORY_CARE_PROVIDER_SITE_OTHER): Payer: No Typology Code available for payment source | Admitting: Internal Medicine

## 2020-06-03 ENCOUNTER — Encounter: Payer: Self-pay | Admitting: Internal Medicine

## 2020-06-03 VITALS — BP 118/70 | HR 74 | Temp 97.5°F | Ht 63.0 in | Wt 190.0 lb

## 2020-06-03 DIAGNOSIS — E039 Hypothyroidism, unspecified: Secondary | ICD-10-CM

## 2020-06-03 DIAGNOSIS — J4531 Mild persistent asthma with (acute) exacerbation: Secondary | ICD-10-CM | POA: Diagnosis not present

## 2020-06-03 DIAGNOSIS — Z0000 Encounter for general adult medical examination without abnormal findings: Secondary | ICD-10-CM | POA: Diagnosis not present

## 2020-06-03 DIAGNOSIS — J309 Allergic rhinitis, unspecified: Secondary | ICD-10-CM

## 2020-06-03 DIAGNOSIS — R7302 Impaired glucose tolerance (oral): Secondary | ICD-10-CM

## 2020-06-03 DIAGNOSIS — Z0001 Encounter for general adult medical examination with abnormal findings: Secondary | ICD-10-CM
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
## 2020-06-03 NOTE — Patient Instructions (Addendum)
Ok to stop the thyroid medication pills  Please go to the [facility] lab in 4 wks to recheck the thyroid testing  Please continue all other medications as before, and refills have been done if requested.  Please have the pharmacy call with any other refills you may need.  Please continue your efforts at being more active, low cholesterol diet, and weight control.  You are otherwise up to date with prevention measures today.  Please keep your appointments with your specialists as you may have planned - allergy on Monday  Please make an Appointment to return in 3 months, or sooner if needed

## 2020-06-06 ENCOUNTER — Encounter: Payer: Self-pay | Admitting: Internal Medicine

## 2020-06-06 NOTE — Assessment & Plan Note (Signed)
With low TSH, current symptoms and wt loss, I suspect has iatrogenic hyperthyroidism; will d/c all levothyroxine for now, and recheck TFTs in 4 wks

## 2020-06-06 NOTE — Assessment & Plan Note (Signed)
Lab Results  Component Value Date   HGBA1C 5.8 05/27/2020   Stable, pt to continue current medical treatment  - diet  

## 2020-06-06 NOTE — Assessment & Plan Note (Signed)
Age and sex appropriate education and counseling updated with regular exercise and diet Referrals for preventative services - none needed Immunizations addressed - tdap declined Smoking counseling  - none needed Evidence for depression or other mood disorder - none significant Most recent labs reviewed. I have personally reviewed and have noted: 1) the patient's medical and social history 2) The patient's current medications and supplements 3) The patient's height, weight, and BMI have been recorded in the chart

## 2020-06-06 NOTE — Assessment & Plan Note (Signed)
Stable, to continue current med tx - ventolin hfa, and advair asd  Current Outpatient Medications (Endocrine & Metabolic):  .  predniSONE (DELTASONE) 10 MG tablet, 2 tabs by mouth per day for 5 days (Patient not taking: Reported on 06/03/2020)  Current Outpatient Medications (Cardiovascular):  .  furosemide (LASIX) 40 MG tablet, Take 1 tablet (40 mg total) by mouth daily. (Patient taking differently: Take 40 mg by mouth daily as needed for fluid or edema.) .  rosuvastatin (CRESTOR) 10 MG tablet, TAKE 1 TABLET BY MOUTH DAILY.  Current Outpatient Medications (Respiratory):  [name]  Fluticasone-Salmeterol (ADVAIR DISKUS) 250-50 MCG/DOSE AEPB, Inhale 1 puff into the lungs 2 (two) times daily. (Patient taking differently: Inhale 1 puff into the lungs See admin instructions. Pt has been using Ventolin as her daily inhaler not the Advair. SIG is 1 puff bid pt has been using as 1 puff prn for coughing/wheezing/emergency use) .  VENTOLIN HFA 108 (90 Base) MCG/ACT inhaler, INHALE 2 PUFFS BY MOUTH INTO THE LUNGS EVERY 6 HOURS AS NEEDED FOR WHEEZING OR SHORTNESS OF BREATH (Patient taking differently: Inhale 2 puffs into the lungs See admin instructions. Pt has been using Ventolin as her daily inhaler instead of the Advair. Original sig is 2 puffs q6hprn pt has been taking as 2 puffs bid.) .  fluticasone (FLONASE) 50 MCG/ACT nasal spray, Place 2 sprays into both nostrils daily for 10 days. (Patient taking differently: Place 2 sprays into both nostrils as needed. ) .  loratadine (CLARITIN) 10 MG tablet, Take 10 mg by mouth daily. (Patient not taking: Reported on 06/03/2020) .  promethazine-dextromethorphan (PROMETHAZINE-DM) 6.25-15 MG/5ML syrup, Take 5 mLs by mouth 4 (four) times daily as needed for cough. (Patient not taking: Reported on 06/03/2020)  Current Outpatient Medications (Analgesics):  .  acetaminophen (TYLENOL) 500 MG tablet, Take 500 mg every 6 (six) hours as needed by mouth for moderate pain.  [name]  ibuprofen  (ADVIL) 800 MG tablet, Take 1 tablet (800 mg total) by mouth every 8 (eight) hours as needed for moderate pain. [name]  oxyCODONE 10 MG TABS, Take 1 tablet (10 mg total) by mouth every 6 (six) hours as needed for severe pain. (Patient not taking: Reported on 06/03/2020)  Current Outpatient Medications (Hematological):  .  cyanocobalamin 1000 MCG tablet, Take 1,000 mcg by mouth daily. (Patient not taking: Reported on 06/03/2020)  Current Outpatient Medications (Other):  [name]  ALPRAZolam (XANAX) 0.5 MG tablet, Take 0.5 mg by mouth at bedtime as needed for anxiety. .  betamethasone valerate (VALISONE) 0.1 % cream, Apply topically as needed. .  cholecalciferol (VITAMIN D3) 25 MCG (1000 UT) tablet, Take by mouth once a week.  .  conjugated estrogens (PREMARIN) vaginal cream, conjugated estrogens 0.625 mg/gram vaginal cream  Insert 0.5 applicatorsful twice a week by vaginal route. .  hydrOXYzine (ATARAX/VISTARIL) 10 MG tablet, Take 1 tablet (10 mg total) by mouth 3 (three) times daily as needed. .  lidocaine (XYLOCAINE) 5 % ointment, SMARTSIG:Topical 1-4 Times Daily PRN .  ondansetron (ZOFRAN-ODT) 4 MG disintegrating tablet, Take 1 tablet (4 mg total) by mouth every 8 (eight) hours as needed for nausea or vomiting. .  pantoprazole (PROTONIX) 40 MG tablet, TAKE 1 TABLET BY MOUTH ONCE DAILY .  phentermine 37.5 MG capsule, Take 1 capsule (37.5 mg total) by mouth every morning. .  polyethylene glycol (MIRALAX / GLYCOLAX) 17 g packet, Take 17 g by mouth daily. [name]  UNKNOWN TO PATIENT, estrogen .  zolpidem (AMBIEN) 10 MG tablet, Take 1  tablet (10 mg total) by mouth at bedtime as needed for sleep. [name]  amitriptyline (ELAVIL) 50 MG tablet, TAKE 1 TABLET BY MOUTH AT BEDTIME. (Patient not taking: Reported on 06/03/2020) .  methocarbamol (ROBAXIN) 500 MG tablet, Take 1 tablet (500 mg total) by mouth every 6 (six) hours as needed for muscle spasms (prn moderate pain). (Patient not taking: Reported on 06/03/2020)

## 2020-06-06 NOTE — Assessment & Plan Note (Signed)
Stable currenty, ok to f/u allergy but suspect her pruritis may be thyroid related

## 2020-06-07 ENCOUNTER — Ambulatory Visit: Payer: No Typology Code available for payment source | Admitting: Allergy & Immunology

## 2020-06-07 ENCOUNTER — Encounter: Payer: Self-pay | Admitting: Allergy & Immunology

## 2020-06-07 ENCOUNTER — Other Ambulatory Visit: Payer: Self-pay

## 2020-06-07 VITALS — BP 114/70 | HR 74 | Temp 98.6°F | Resp 24 | Ht 63.0 in | Wt 188.4 lb

## 2020-06-07 DIAGNOSIS — J4520 Mild intermittent asthma, uncomplicated: Secondary | ICD-10-CM | POA: Diagnosis not present

## 2020-06-07 DIAGNOSIS — L299 Pruritus, unspecified: Secondary | ICD-10-CM | POA: Diagnosis not present

## 2020-06-07 MED ORDER — TRIAMCINOLONE ACETONIDE 0.1 % EX OINT: TOPICAL_OINTMENT | Freq: Two times a day (BID) | CUTANEOUS | 3 refills | Status: DC | PRN

## 2020-06-07 NOTE — Patient Instructions (Addendum)
1. Mild intermittent asthma, uncomplicated - Lung testing looked awesome - We are not going to make any changes at this time. - Continue with albuterol 2 puffs every 4-6 hours as needed.  2. Pruritus - Testing was positive only to dust mites. - Results and avoidance measures discussed. - Testing was negative to the most common foods (peanut, sesame, cashew, soy, fish mix, shellfish mix, wheat, milk, casein, egg). - This rules out 95% of all food allergies. - We are going to get some labs to check for difficult to control causes of itching. - We will call you in 1-2 weeks with the results of the testing.  - Stop the hydroxyzine and start the following:  AM: Allegra 1-2 tablets + Pepcid 20mg   PM: Allegra 1-2 tablets + Pepcid 20mg   - This regimen should last longer and provide better control of the itching. - We are also starting Eucerin + triamcinolone ointment twice daily to your entire body. - We can change that dosing once we see you back in one month.  3. Return in about 4 weeks (around 07/05/2020).    Please inform us of any Emergency Department visits, hospitalizations, or changes in symptoms. Call us before going to the ED for breathing or allergy symptoms since we might be able to fit you in for a sick visit. Feel free to contact us anytime with any questions, problems, or concerns.  It was a pleasure to meet you today!  Websites that have reliable patient information: 1. American Academy of Asthma, Allergy, and Immunology: www.aaaai.org 2. Food Allergy Research and Education (FARE): foodallergy.org 3. Mothers of Asthmatics: http://www.asthmacommunitynetwork.org 4. American College of Allergy, Asthma, and Immunology: www.acaai.org   COVID-19 Vaccine Information can be found at: [email or web address] For questions related to vaccine distribution or appointments, please email [email or web address] or call [phone].    We realize that you might be concerned about having an allergic reaction to the COVID19 vaccines. To help with that concern, WE ARE OFFERING THE COVID19 VACCINES IN OUR OFFICE! Ask the front desk for dates!     "Like" [place] on Facebook and Instagram for our latest updates!       Make sure you are registered to vote! If you have moved or changed any of your contact information, you will need to get this updated before voting!  In some cases, you MAY be able to register to vote online: [email or web address]   Control of Dust Mite Allergen    Dust mites play a major role in allergic asthma and rhinitis.  They occur in environments with high humidity wherever human skin is found.  Dust mites absorb humidity from the atmosphere (ie, they do not drink) and feed on organic matter (including shed human and animal skin).  Dust mites are a microscopic type of insect that you cannot see with the naked eye.  High levels of dust mites have been detected from mattresses, pillows, carpets, upholstered furniture, bed covers, clothes, soft toys and any woven material.  The principal allergen of the dust mite is found in its feces.  A gram of dust may contain 1,000 mites and 250,000 fecal particles.  Mite antigen is easily measured in the air during house cleaning activities.  Dust mites do not bite and do not cause harm to humans, other than by triggering allergies/asthma.    Ways to decrease your exposure to dust mites in your home:  1. Encase mattresses, box springs and pillows with a mite-impermeable barrier or cover   2. Wash  sheets, blankets and drapes weekly in hot water (130 F) with detergent and dry them in a dryer on the hot setting.  3. Have the room cleaned frequently with a vacuum cleaner and a damp dust-mop.  For carpeting or rugs, vacuuming with a vacuum cleaner equipped with a high-efficiency particulate air (HEPA) filter.  The dust mite allergic individual should  not be in a room which is being cleaned and should wait 1 hour after cleaning before going into the room. 4. Do not sleep on upholstered furniture (eg, couches).   5. If possible removing carpeting, upholstered furniture and drapery from the home is ideal.  Horizontal blinds should be eliminated in the rooms where the person spends the most time (bedroom, study, television room).  Washable vinyl, roller-type shades are optimal. 6. Remove all non-washable stuffed toys from the bedroom.  Wash stuffed toys weekly like sheets and blankets above.   7. Reduce indoor humidity to less than 50%.  Inexpensive humidity monitors can be purchased at most hardware stores.  Do not use a humidifier as can make the problem worse and are not recommended.      Airborne Adult Perc - 06/07/20 0926    Time Antigen Placed 0930    Allergen Manufacturer Lavella Hammock    Location Back    Number of Test 59    Panel 1 Select    1. Control-Buffer 50% Glycerol Negative    2. Control-Histamine 1 mg/ml 2+    3. Albumin saline Negative    4. Sellers Negative    5. [place] Negative    6. Johnson Negative    7. [place] Blue Negative    8. Meadow Fescue Negative    9. Perennial Rye Negative    10. Sweet Vernal Negative    11. Timothy Negative    12. Cocklebur Negative    13. Burweed Marshelder Negative    14. Ragweed, short Negative    15. Ragweed, Giant Negative    16. Plantain,  English Negative    17. Lamb's Quarters Negative    18. Sheep Sorrell Negative    19. Rough Pigweed Negative    20. Marsh Elder, Rough Negative    21. Mugwort, Common Negative    22. Ash mix Negative    23. Birch mix Negative    24. Beech American Negative    25. Box, Elder Negative    26. Cedar, red Negative    27. Cottonwood, [place] Negative    28. Elm mix Negative    29. Hickory Negative    30. Maple mix Negative    31. Oak, [place] mix Negative    32. Pecan Pollen Negative    33. Pine mix Negative    34. Sycamore Eastern Negative     35. Benton, Black Pollen Negative    36. Alternaria alternata Negative    37. Cladosporium Herbarum Negative    38. Aspergillus mix Negative    39. Penicillium mix Negative    40. Bipolaris sorokiniana (Helminthosporium) Negative    41. Drechslera spicifera (Curvularia) Negative    42. Mucor plumbeus Negative    43. Fusarium moniliforme Negative    44. Aureobasidium pullulans (pullulara) Negative    45. Rhizopus oryzae Negative    46. Botrytis cinera Negative    47. Epicoccum nigrum Negative    48. Phoma betae Negative    49. Candida Albicans Negative    50. Trichophyton mentagrophytes Negative    51. Mite, D Farinae  5,000 AU/ml 2+  52. Mite, D Pteronyssinus  5,000 AU/ml Negative    53. Cat Hair 10,000 BAU/ml Negative    54.  Dog Epithelia Negative    55. Mixed Feathers Negative    56. Horse Epithelia Negative    57. Cockroach, German Negative    58. Mouse Negative    59. Tobacco Leaf Negative          Food Perc - 06/07/20 1275      Test Information   Time Antigen Placed 0930    Allergen Manufacturer Lavella Hammock    Number of allergen test 10      Food   1. Peanut Negative    2. Soybean food Negative    3. Wheat, whole Negative    4. Sesame Negative    5. Milk, cow Negative    6. Egg White, chicken Negative    7. Casein Negative    8. Shellfish mix Negative    9. Fish mix Negative    10. Cashew Negative

## 2020-06-07 NOTE — Progress Notes (Signed)
NEW PATIENT  Date of Service/Encounter:  06/07/20  Referring provider: Biagio Borg, MD   Assessment:   Pruritis - with sensitization to dust mite (getting labs to rule out serious causes of itching/hives)  Mild eosinophilia (AEC 500)  Intermittent asthma, uncomplicated   [name] presents for evaluation of itching as well as asthma.  Her lung testing looks great and her symptoms seem to be well controlled with albuterol as needed.  We are not can make any changes on that front.  Her pruritus is a bit more confusing.  She has multiple lesions with eschars and excoriations.  It certainly could be consistent with bedbugs, but she denies ever having seen any.  In addition, her husband has none of these lesions.  She does tell me that she has been getting bit by gnats.  Her symptoms started in January and as its been quite cold, I do not know where gnats would have come problem list as some indoor plants perhaps.  Regardless, we are going to attempt to manage this with topical steroids as well as a daily antihistamine.  We are going to get some labs as well. Finally, this could be related to a medication, as he does have elevated eosinophils.    Plan/Recommendations:   1. Mild intermittent asthma, uncomplicated - Lung testing looked awesome - We are not going to make any changes at this time. - Continue with albuterol 2 puffs every 4-6 hours as needed.  2. Pruritus - Testing was positive only to dust mites. - Results and avoidance measures discussed. - Testing was negative to the most common foods (peanut, sesame, cashew, soy, fish mix, shellfish mix, wheat, milk, casein, egg). - This rules out 95% of all food allergies. - We are going to get some labs to check for difficult to control causes of itching. - We will call you in 1-2 weeks with the results of the testing.  - Stop the hydroxyzine and start the following:  AM: Allegra 1-2 tablets + Pepcid 20mg   PM: Allegra 1-2  tablets + Pepcid 20mg   - This regimen should last longer and provide better control of the itching. - We are also starting Eucerin + triamcinolone ointment twice daily to your entire body. - We can change that dosing once we see you back in one month.  3. Return in about 4 weeks (around 07/05/2020).    Subjective:   [name] is a 65 y.o. female presenting today for evaluation of  Chief Complaint  Patient presents with  . Pruritus    [name] has a history of the following: Patient Active Problem List   Diagnosis Date Noted  . Chronic pruritus 05/30/2020  . Rash 04/03/2020  . Chest pain 11/14/2019  . Insomnia 11/14/2019  . Pelvic prolapse 10/06/2019  . Leg pain, bilateral 05/01/2019  . Acute diverticulitis 12/23/2018  . Asthma 10/02/2018  . Pain in right hand 07/12/2018  . Pain in both feet 11/08/2017  . Nodule of chest wall 10/11/2017  . Mass of right side of neck 10/11/2017  . Varicose veins of bilateral lower extremities with other complications 48/18/5631  . Complication of anesthesia   . Right groin pain 04/21/2017  . Hyperlipidemia LDL goal <100 03/23/2017  . Dorsalgia 03/01/2017  . Rapid palpitations 03/01/2017  . Preop cardiovascular exam 03/01/2017  . Right lumbar radiculopathy 08/15/2016  . Rib pain on right side 03/03/2016  . Nosebleed 03/03/2016  . Acute colitis 08/30/2013  . GERD (gastroesophageal  reflux disease) 08/14/2012  . Pain and swelling of lower leg 10/23/2011  . Depression 07/10/2011  . History of renal stone 04/15/2011  . Pleural effusion 04/14/2011  . Impaired glucose tolerance 04/14/2011  . Hypokalemia 04/14/2011  . Chronic low back pain 03/29/2011  . Obesity 03/29/2011  . Encounter for well adult exam with abnormal findings 12/30/2010  . THYROID NODULE, RIGHT 02/21/2010  . NECK PAIN, RIGHT 02/21/2010  . Bilateral lower extremity edema 02/21/2010  . 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 FATIGUE 03/11/2009  . SINUSITIS, CHRONIC  11/27/2008  . Hypothyroidism 07/19/2007  . Anxiety state 07/19/2007  . Allergic rhinitis 07/19/2007  . DIVERTICULOSIS, COLON 07/19/2007  . DEGENERATIVE DISC DISEASE 07/19/2007  . COLONIC POLYPS, HX OF 07/19/2007    History obtained from: chart review and patient.  [name] was referred by Biagio Borg, MD.     [name] is a 65 y.o. female presenting for an evaluation of itching and asthma.  The itching has been ongoing since January 7th. She feels that her body "totally changed". She tells me that she has been attacked by "insects and mites".The get inside of her clothes and in her car. She has itching that is worse at night. She has been on hydroxyzine that did help a bit. But when the insects get on her, she cannot stand it. She reports that there are white and black gnats in the neighborhood that bother her.They are not bothering her husband, however. She has had the exterminator there and has done washing of the sheets and extreme cleaning.    She does have a rash with the itching. It is all over the body. The back is the worse. She has some large areas on her leg. She has used OTC hydrocortisone. Then it attacks another place. She does not think that this is related to foods. She is not allergic to any foods that she knows of. She tolerates all of the major food allergens without adverse event. She is no longer on the hydroxyzine.   She has had two solumedrol injections and prednisone for these itching episodes. It calmed it down for a short period of time but it returned. She saw Dr. Jeneen Rinks and she had her thyroid medication changes. Her TSH was going all over the place. She is not sure whether this is all related. She has not had this problem until COVID19 infection.   Of note, she did have elevated AEC of 500 in her most recent CBC with differential.   She did have a similar reaction around 20 or 30 years ago. She was evaluated by Dr. Shaune Leeks. She does not remember what all was  done. She had to take allergy pills off and on. But overall her symptoms improved over the course of time.  Asthma/Respiratory Symptom History: She has asthma that is triggered by perfumes. She has used her albuterol a couple of times since the start of [place].  Kasiyah's asthma has been well controlled. She has not required rescue medication, experienced nocturnal awakenings due to lower respiratory symptoms, nor have activities of daily living been limited. She has required no Emergency Department or Urgent Care visits for her asthma. She has required zero courses of systemic steroids for asthma exacerbations since the last visit. ACT score today is 25, indicating excellent asthma symptom control.   Otherwise, there is no history of other atopic diseases, including food allergies, drug allergies, stinging insect allergies or contact dermatitis. There is no significant infectious history. Vaccinations  are up to date.    Past Medical History: Patient Active Problem List   Diagnosis Date Noted  . Chronic pruritus 05/30/2020  . Rash 04/03/2020  . Chest pain 11/14/2019  . Insomnia 11/14/2019  . Pelvic prolapse 10/06/2019  . Leg pain, bilateral 05/01/2019  . Acute diverticulitis 12/23/2018  . Asthma 10/02/2018  . Pain in right hand 07/12/2018  . Pain in both feet 11/08/2017  . Nodule of chest wall 10/11/2017  . Mass of right side of neck 10/11/2017  . Varicose veins of bilateral lower extremities with other complications 32/67/1245  . Complication of anesthesia   . Right groin pain 04/21/2017  . Hyperlipidemia LDL goal <100 03/23/2017  . Dorsalgia 03/01/2017  . Rapid palpitations 03/01/2017  . Preop cardiovascular exam 03/01/2017  . Right lumbar radiculopathy 08/15/2016  . Rib pain on right side 03/03/2016  . Nosebleed 03/03/2016  . Acute colitis 08/30/2013  . GERD (gastroesophageal reflux disease) 08/14/2012  . Pain and swelling of lower leg 10/23/2011  . Depression 07/10/2011  .  History of renal stone 04/15/2011  . Pleural effusion 04/14/2011  . Impaired glucose tolerance 04/14/2011  . Hypokalemia 04/14/2011  . Chronic low back pain 03/29/2011  . Obesity 03/29/2011  . Encounter for well adult exam with abnormal findings 12/30/2010  . THYROID NODULE, RIGHT 02/21/2010  . NECK PAIN, RIGHT 02/21/2010  . Bilateral lower extremity edema 02/21/2010  . 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 FATIGUE 03/11/2009  . SINUSITIS, CHRONIC 11/27/2008  . Hypothyroidism 07/19/2007  . Anxiety state 07/19/2007  . Allergic rhinitis 07/19/2007  . DIVERTICULOSIS, COLON 07/19/2007  . DEGENERATIVE DISC DISEASE 07/19/2007  . COLONIC POLYPS, HX OF 07/19/2007    Medication List:  Allergies as of 06/07/2020      Reactions   Amoxicillin Shortness Of Breath, Rash   REACTION: rash, sob - tol kelfex and rocephn hosp 06/2009 Has patient had a PCN reaction causing immediate rash, facial/tongue/throat swelling, SOB or lightheadedness with hypotension: YES Has patient had a PCN reaction causing severe rash involving mucus membranes or skin necrosis: YES Has patient had a PCN reaction that required hospitalization UNKNOWN Has patient had a PCN reaction occurring within the last 10 years: NO If all of the above answers are "NO", then may proceed with Cephalosporin use   Aspirin Shortness Of Breath, Rash   Tolerates ibuprofen without issue   Latex Shortness Of Breath, Dermatitis   Blisters on skin   Sulfa Antibiotics Shortness Of Breath, Rash   But reports bactrim tolerence   Avelox [moxifloxacin Hcl In Nacl] Hives   TOLERATES CIPRO      Medication List       Accurate as of June 07, 2020 12:16 PM. If you have any questions, ask your nurse or doctor.        acetaminophen 500 MG tablet Commonly known as: TYLENOL Take 500 mg every 6 (six) hours as needed by mouth for moderate pain.   ALPRAZolam 0.5 MG tablet Commonly known as: XANAX Take 0.5 mg by mouth at bedtime as needed for  anxiety.   amitriptyline 50 MG tablet Commonly known as: ELAVIL TAKE 1 TABLET BY MOUTH AT BEDTIME.   betamethasone valerate 0.1 % cream Commonly known as: VALISONE Apply topically as needed.   cholecalciferol 25 MCG (1000 UNIT) tablet Commonly known as: VITAMIN D3 Take by mouth once a week.   conjugated estrogens vaginal cream Commonly known as: PREMARIN Pt. Takes Monday and friday   cyanocobalamin 1000 MCG tablet Take 1,000 mcg  by mouth daily.   fluticasone 50 MCG/ACT nasal spray Commonly known as: FLONASE Place 2 sprays into both nostrils daily for 10 days. What changed:   when to take this  reasons to take this   Fluticasone-Salmeterol 250-50 MCG/DOSE Aepb Commonly known as: Advair Diskus Inhale 1 puff into the lungs 2 (two) times daily.   furosemide 40 MG tablet Commonly known as: Lasix Take 1 tablet (40 mg total) by mouth daily. What changed:   when to take this  reasons to take this   hydrOXYzine 10 MG tablet Commonly known as: ATARAX/VISTARIL Take 1 tablet (10 mg total) by mouth 3 (three) times daily as needed.   ibuprofen 800 MG tablet Commonly known as: ADVIL Take 1 tablet (800 mg total) by mouth every 8 (eight) hours as needed for moderate pain.   lidocaine 5 % ointment Commonly known as: XYLOCAINE SMARTSIG:Topical 1-4 Times Daily PRN   loratadine 10 MG tablet Commonly known as: CLARITIN Take 10 mg by mouth daily.   methocarbamol 500 MG tablet Commonly known as: ROBAXIN Take 1 tablet (500 mg total) by mouth every 6 (six) hours as needed for muscle spasms (prn moderate pain).   ondansetron 4 MG disintegrating tablet Commonly known as: ZOFRAN-ODT Take 1 tablet (4 mg total) by mouth every 8 (eight) hours as needed for nausea or vomiting.   Oxycodone HCl 10 MG Tabs Take 1 tablet (10 mg total) by mouth every 6 (six) hours as needed for severe pain.   pantoprazole 40 MG tablet Commonly known as: PROTONIX TAKE 1 TABLET BY MOUTH ONCE DAILY    phentermine 37.5 MG capsule Take 1 capsule (37.5 mg total) by mouth every morning.   polyethylene glycol 17 g packet Commonly known as: MIRALAX / GLYCOLAX Take 17 g by mouth daily.   predniSONE 10 MG tablet Commonly known as: DELTASONE 2 tabs by mouth per day for 5 days   promethazine-dextromethorphan 6.25-15 MG/5ML syrup Commonly known as: PROMETHAZINE-DM Take 5 mLs by mouth 4 (four) times daily as needed for cough.   rosuvastatin 10 MG tablet Commonly known as: CRESTOR TAKE 1 TABLET BY MOUTH DAILY.   triamcinolone 0.1% oint-Eucerin equivalent cream 1:1 mixture Apply topically 2 (two) times daily as needed. Started by: Valentina Shaggy, MD   UNKNOWN TO PATIENT estrogen   Ventolin HFA 108 (90 Base) MCG/ACT inhaler Generic drug: albuterol INHALE 2 PUFFS BY MOUTH INTO THE LUNGS EVERY 6 HOURS AS NEEDED FOR WHEEZING OR SHORTNESS OF BREATH What changed: See the new instructions.   zolpidem 10 MG tablet Commonly known as: AMBIEN Take 1 tablet (10 mg total) by mouth at bedtime as needed for sleep.       Birth History: non-contributory  Developmental History: on-contributory  Past Surgical History: Past Surgical History:  Procedure Laterality Date  . ANTERIOR AND POSTERIOR REPAIR WITH SACROSPINOUS FIXATION N/A 10/06/2019   Procedure: POSTERIOR REPAIR WITH SACROSPINOUS FIXATION;  Surgeon: Everlene Farrier, MD;  Location: [facility];  Service: Gynecology;  Laterality: N/A;  need bed  . BREAST BIOPSY Bilateral 05/2018   benign  . BREAST BIOPSY Left 11/16/2014  . BREAST EXCISIONAL BIOPSY Left 12/02/2007  . BREAST SURGERY Left 12-02-2007 @[facility]    Lumectomy of mass and Excisional biopsy subareolar  . COLONOSCOPY  last one 06-28-2017  . CYSTOSCOPY/RETROGRADE/URETEROSCOPY/STONE EXTRACTION WITH BASKET  2015  . DIAGNOSTIC LAPAROSCOPY  03-23-2004   @[facility]   . LAPAROSCOPIC CHOLECYSTECTOMY  2000  . LUMBAR LAMINECTOMY/DECOMPRESSION MICRODISCECTOMY Left 12/18/2012    Procedure: Left Lumbar  Five Sacral One Extraforaminal Microdiskectomy;  Surgeon: Floyce Stakes, MD;  Location: [facility];  Service: Neurosurgery;  Laterality: Left;  LUMBAR LAMINECTOMY/DECOMPRESSION MICRODISCECTOMY 1 LEVEL  . NASAL SINUS SURGERY  2010  approx.    to remove  tooth remanant's  . VAGINAL HYSTERECTOMY  1992   AND ANTERIOR REPAIR  . WISDOM TOOTH EXTRACTION       Family History: Family History  Problem Relation Age of Onset  . Dementia Mother        Still alive at 2  . Breast cancer Mother   . Heart disease Mother        She does not know details  . Dementia Father        Still alive at 28  . Colon cancer Father 65  . Stroke Father   . Hypertension Other   . Cancer Other   . Heart disease Brother 62       Enlarged heart  . Heart attack Maternal Grandfather   . Stomach cancer Neg Hx   . Esophageal cancer Neg Hx   . Pancreatic cancer Neg Hx   . Rectal cancer Neg Hx      Social History: Kathaleya lives at home with her family.  They live in a house that is 63 years old.  There is wood in the main living areas and carpeting in the bedroom.  They have a heat pump for heating and cooling as well as wood heating.  There is 1 dog inside of the home.  There are dust mite covers on the bedding.  There is no tobacco exposure.  She currently works as a Agricultural engineer.  She is not exposed to fumes, chemicals, or dust.  She does not use a HEPA filter.   Review of Systems  Constitutional: Negative.  Negative for chills, fever, malaise/fatigue and weight loss.  HENT: Negative.  Negative for congestion, ear discharge, ear pain and sore throat.   Eyes: Negative for pain, discharge and redness.  Respiratory: Negative for cough, sputum production, shortness of breath and wheezing.   Cardiovascular: Negative.  Negative for chest pain and palpitations.  Gastrointestinal: Negative for abdominal pain, constipation, diarrhea, heartburn, nausea and vomiting.  Skin: Positive for itching and  rash.  Neurological: Negative for dizziness and headaches.  Endo/Heme/Allergies: Negative for environmental allergies. Does not bruise/bleed easily.       Objective:   Blood pressure 114/70, pulse 74, temperature 98.6 F (37 C), temperature source Tympanic, resp. rate (!) 24, height 5\' 3"  (1.6 m), weight 188 lb 6.4 oz (85.5 kg), SpO2 99 %. Body mass index is 33.37 kg/m.   Physical Exam:   Physical Exam Constitutional:      Appearance: She is well-developed.     Comments: Fairly anxious.  HENT:     Head: Normocephalic and atraumatic.     Right Ear: Tympanic membrane, ear canal and external ear normal. No drainage, swelling or tenderness. Tympanic membrane is not injected, scarred, erythematous, retracted or bulging.     Left Ear: Tympanic membrane, ear canal and external ear normal. No drainage, swelling or tenderness. Tympanic membrane is not injected, scarred, erythematous, retracted or bulging.     Nose: No nasal deformity, septal deviation, mucosal edema, rhinorrhea or epistaxis.     Right Turbinates: Enlarged and swollen.     Left Turbinates: Enlarged and swollen.     Right Sinus: No maxillary sinus tenderness or frontal sinus tenderness.     Left Sinus: No maxillary sinus tenderness or  frontal sinus tenderness.     Comments: Turbinates enlarged bilaterally.    Mouth/Throat:     Mouth: Oropharynx is clear and moist. Mucous membranes are not pale and not dry.     Pharynx: Uvula midline.  Eyes:     General:        Right eye: No discharge.        Left eye: No discharge.     Extraocular Movements: EOM normal.     Conjunctiva/sclera: Conjunctivae normal.     Right eye: Right conjunctiva is not injected. No chemosis.    Left eye: Left conjunctiva is not injected. No chemosis.    Pupils: Pupils are equal, round, and reactive to light.  Cardiovascular:     Rate and Rhythm: Normal rate and regular rhythm.     Heart sounds: Normal heart sounds.  Pulmonary:     Effort:  Pulmonary effort is normal. No tachypnea, accessory muscle usage or respiratory distress.     Breath sounds: Normal breath sounds. No wheezing, rhonchi or rales.  Chest:     Chest wall: No tenderness.  Abdominal:     Tenderness: There is no abdominal tenderness. There is no guarding or rebound.  Lymphadenopathy:     Head:     Right side of head: No submandibular, tonsillar or occipital adenopathy.     Left side of head: No submandibular, tonsillar or occipital adenopathy.     Cervical: No cervical adenopathy.  Skin:    General: Skin is warm.     Capillary Refill: Capillary refill takes less than 2 seconds.     Coloration: Skin is not pale.     Findings: Rash present. No abrasion, erythema or petechiae. Rash is nodular and papular. Rash is not urticarial or vesicular.     Comments: Scattered papules with eschar formation and excoriations.  No draining.  Neurological:     Mental Status: She is alert.  Psychiatric:        Mood and Affect: Mood and affect normal.      Diagnostic studies:    Spirometry: results normal (FEV1: 2.06/85%, FVC: 2.41/77%, FEV1/FVC: 85%).    Spirometry consistent with normal pattern.    Allergy Studies:     Airborne Adult Perc - 06/07/20 0926    Time Antigen Placed 0930    Allergen Manufacturer Lavella Hammock    Location Back    Number of Test 59    Panel 1 Select    1. Control-Buffer 50% Glycerol Negative    2. Control-Histamine 1 mg/ml 2+    3. Albumin saline Negative    4. [place] Negative    5. [place] Negative    6. Johnson Negative    7. Sheldahl Blue Negative    8. Meadow Fescue Negative    9. Perennial Rye Negative    10. Sweet Vernal Negative    11. Timothy Negative    12. Cocklebur Negative    13. Burweed Marshelder Negative    14. Ragweed, short Negative    15. Ragweed, Giant Negative    16. Plantain,  English Negative    17. Lamb's Quarters Negative    18. Sheep Sorrell Negative    19. Rough Pigweed Negative    20. Marsh Elder, Rough  Negative    21. Mugwort, Common Negative    22. Ash mix Negative    23. Birch mix Negative    24. Beech American Negative    25. Box, Elder Negative    26. Cedar, red Negative  27. Cottonwood, Eastern Negative    28. Elm mix Negative    29. Hickory Negative    30. Maple mix Negative    31. Oak, [place] mix Negative    32. Pecan Pollen Negative    33. Pine mix Negative    34. Sycamore Eastern Negative    35. [place], Black Pollen Negative    36. Alternaria alternata Negative    37. Cladosporium Herbarum Negative    38. Aspergillus mix Negative    39. Penicillium mix Negative    40. Bipolaris sorokiniana (Helminthosporium) Negative    41. Drechslera spicifera (Curvularia) Negative    42. Mucor plumbeus Negative    43. Fusarium moniliforme Negative    44. Aureobasidium pullulans (pullulara) Negative    45. Rhizopus oryzae Negative    46. Botrytis cinera Negative    47. Epicoccum nigrum Negative    48. Phoma betae Negative    49. Candida Albicans Negative    50. Trichophyton mentagrophytes Negative    51. Mite, D Farinae  5,000 AU/ml 2+    52. Mite, D Pteronyssinus  5,000 AU/ml Negative    53. Cat Hair 10,000 BAU/ml Negative    54.  Dog Epithelia Negative    55. Mixed Feathers Negative    56. Horse Epithelia Negative    57. Cockroach, German Negative    58. Mouse Negative    59. Tobacco Leaf Negative          Food Perc - 06/07/20 7588      Test Information   Time Antigen Placed 0930    Allergen Manufacturer Lavella Hammock    Number of allergen test 10      Food   1. Peanut Negative    2. Soybean food Negative    3. Wheat, whole Negative    4. Sesame Negative    5. Milk, cow Negative    6. Egg White, chicken Negative    7. Casein Negative    8. Shellfish mix Negative    9. Fish mix Negative    10. Cashew Negative           Allergy testing results were read and interpreted by myself, documented by clinical staff.         Salvatore Marvel, MD [facility]

## 2020-06-09 ENCOUNTER — Encounter: Payer: Self-pay | Admitting: Allergy & Immunology

## 2020-06-10 ENCOUNTER — Telehealth: Payer: Self-pay | Admitting: *Deleted

## 2020-06-10 NOTE — Telephone Encounter (Signed)
Sounds like a plan!   Clydine Parkison, MD [facility]  

## 2020-06-10 NOTE — Telephone Encounter (Signed)
Patient has reached out to me regarding note in labs to possibly start [place].  I advised her that I looked over note from visit on Monday 2/14 and we should try to get approval after her followup since she had not tried and failed H1 and H2 antihistamine therapy and they would likely deny same.  Patient ok with this plan

## 2020-06-11 LAB — ALPHA-GAL PANEL
Allergen Lamb IgE: <0.1 kU/L
Beef IgE: <0.1 kU/L
IgE (Immunoglobulin E), Serum: 118 IU/mL (ref 6–495)
O215-IgE Alpha-Gal: <0.1 kU/L
Pork IgE: <0.1 kU/L

## 2020-06-14 ENCOUNTER — Encounter: Payer: Self-pay | Admitting: Internal Medicine

## 2020-06-18 LAB — CMP14+EGFR
ALT: 23 IU/L (ref 0–32)
AST: 15 IU/L (ref 0–40)
Albumin/Globulin Ratio: 1.8 (ref 1.2–2.2)
Albumin: 4.3 g/dL (ref 3.8–4.8)
Alkaline Phosphatase: 83 IU/L (ref 44–121)
BUN/Creatinine Ratio: 22 (ref 12–28)
BUN: 16 mg/dL (ref 8–27)
Bilirubin Total: 0.3 mg/dL (ref 0.0–1.2)
CO2: 21 mmol/L (ref 20–29)
Calcium: 9.5 mg/dL (ref 8.7–10.3)
Chloride: 104 mmol/L (ref 96–106)
Creatinine, Ser: 0.72 mg/dL (ref 0.57–1.00)
GFR calc Af Amer: 104 mL/min/{1.73_m2} (ref >59)
GFR calc non Af Amer: 90 mL/min/{1.73_m2} (ref >59)
Globulin, Total: 2.4 g/dL (ref 1.5–4.5)
Glucose: 110 mg/dL — ABNORMAL HIGH (ref 65–99)
Potassium: 4 mmol/L (ref 3.5–5.2)
Sodium: 143 mmol/L (ref 134–144)
Total Protein: 6.7 g/dL (ref 6.0–8.5)

## 2020-06-18 LAB — TRYPTASE: Tryptase: 3.6 ug/L (ref 2.2–13.2)

## 2020-06-18 LAB — ANTINUCLEAR ANTIBODIES, IFA: ANA Titer 1: NEGATIVE

## 2020-06-18 LAB — C-REACTIVE PROTEIN: CRP: <1 mg/L (ref 0–10)

## 2020-06-18 LAB — SEDIMENTATION RATE: Sed Rate: 10 mm/hr (ref 0–40)

## 2020-06-18 LAB — CHRONIC URTICARIA: cu index: 1.1 (ref <10)

## 2020-07-01 ENCOUNTER — Other Ambulatory Visit (INDEPENDENT_AMBULATORY_CARE_PROVIDER_SITE_OTHER): Payer: No Typology Code available for payment source

## 2020-07-01 DIAGNOSIS — E039 Hypothyroidism, unspecified: Secondary | ICD-10-CM

## 2020-07-01 LAB — T4, FREE: Free T4: 0.19 ng/dL — ABNORMAL LOW (ref 0.60–1.60)

## 2020-07-01 LAB — TSH: TSH: 146.28 u[IU]/mL — ABNORMAL HIGH (ref 0.35–4.50)

## 2020-07-02 ENCOUNTER — Encounter: Payer: Self-pay | Admitting: Internal Medicine

## 2020-07-02 ENCOUNTER — Other Ambulatory Visit: Payer: Self-pay | Admitting: Internal Medicine

## 2020-07-02 ENCOUNTER — Other Ambulatory Visit: Payer: Self-pay

## 2020-07-02 DIAGNOSIS — E039 Hypothyroidism, unspecified: Secondary | ICD-10-CM

## 2020-07-02 MED ORDER — LEVOTHYROXINE SODIUM 100 MCG PO TABS: 100.0000 ug | ORAL_TABLET | Freq: Every day | ORAL | 3 refills | Status: DC

## 2020-07-08 ENCOUNTER — Ambulatory Visit: Payer: 59 | Admitting: Allergy & Immunology

## 2020-07-08 ENCOUNTER — Ambulatory Visit: Payer: No Typology Code available for payment source | Admitting: Allergy & Immunology

## 2020-07-08 ENCOUNTER — Other Ambulatory Visit: Payer: Self-pay

## 2020-07-08 ENCOUNTER — Encounter: Payer: Self-pay | Admitting: Allergy & Immunology

## 2020-07-08 VITALS — BP 100/72 | HR 75 | Temp 98.2°F | Resp 20

## 2020-07-08 DIAGNOSIS — L299 Pruritus, unspecified: Secondary | ICD-10-CM

## 2020-07-08 DIAGNOSIS — L508 Other urticaria: Secondary | ICD-10-CM

## 2020-07-08 DIAGNOSIS — L501 Idiopathic urticaria: Secondary | ICD-10-CM | POA: Diagnosis not present

## 2020-07-08 DIAGNOSIS — J4520 Mild intermittent asthma, uncomplicated: Secondary | ICD-10-CM

## 2020-07-08 MED ORDER — EPINEPHRINE 0.3 MG/0.3ML IJ SOAJ: 0.3000 mg | INTRAMUSCULAR | 1 refills | Status: DC | PRN

## 2020-07-08 MED ORDER — OMALIZUMAB 150 MG ~~LOC~~ SOLR
300.0000 mg | SUBCUTANEOUS | Status: DC
  Administered 2020-07-08 – 2021-01-19 (×9): 300 mg via SUBCUTANEOUS

## 2020-07-08 NOTE — Progress Notes (Addendum)
FOLLOW UP  Date of Service/Encounter:  07/08/20   Assessment:   Pruritis - with sensitization to dust mite   Chronic urticaria - with incomplete resolution with the use of suppressive antihistamines  Mild eosinophilia (AEC 500)  Intermittent asthma, uncomplicated  Plan/Recommendations:   1. Mild intermittent asthma, uncomplicated - Lung testing looked awesome - We are not going to make any changes at this time. - Continue with albuterol 2 puffs every 4-6 hours as needed.  2. Pruritus with urticaria - We are going to give you a sample of Xolair today. - Tammy will be reaching out to you regarding the approval process. - Continue the hydroxyzine and start the following:  AM: Allegra 1-2 tablets + Pepcid 20mg   PM: Allegra 1-2 tablets + Pepcid 20mg   - This regimen should last longer and provide better control of the itching. - Continue with the use of Eucerin + triamcinolone ointment ONCE daily to your entire body. - We can wean medications over time as the Xolair takes effect (sometimes it can take up to 3 doses to feel an effect).  3. Return in about 3 months (around 10/08/2020).  Subjective:   DEVANEY SEGERS is a 65 y.o. female presenting today for follow up of  Chief Complaint  Patient presents with  . Asthma    Still having problems with ants. Skin is doing better with medication.    MAURICE RAMSEUR has a history of the following: Patient Active Problem List   Diagnosis Date Noted  . Chronic pruritus 05/30/2020  . Rash 04/03/2020  . Chest pain 11/14/2019  . Insomnia 11/14/2019  . Pelvic prolapse 10/06/2019  . Leg pain, bilateral 05/01/2019  . Acute diverticulitis 12/23/2018  . Asthma 10/02/2018  . Pain in right hand 07/12/2018  . Pain in both feet 11/08/2017  . Nodule of chest wall 10/11/2017  . Mass of right side of neck 10/11/2017  . Varicose veins of bilateral lower extremities with other complications 41/32/4401  . Complication of anesthesia   .  Right groin pain 04/21/2017  . Hyperlipidemia LDL goal <100 03/23/2017  . Dorsalgia 03/01/2017  . Rapid palpitations 03/01/2017  . Preop cardiovascular exam 03/01/2017  . Right lumbar radiculopathy 08/15/2016  . Rib pain on right side 03/03/2016  . Nosebleed 03/03/2016  . Acute colitis 08/30/2013  . GERD (gastroesophageal reflux disease) 08/14/2012  . Pain and swelling of lower leg 10/23/2011  . Depression 07/10/2011  . History of renal stone 04/15/2011  . Pleural effusion 04/14/2011  . Impaired glucose tolerance 04/14/2011  . Hypokalemia 04/14/2011  . Chronic low back pain 03/29/2011  . Obesity 03/29/2011  . Encounter for well adult exam with abnormal findings 12/30/2010  . THYROID NODULE, RIGHT 02/21/2010  . NECK PAIN, RIGHT 02/21/2010  . Bilateral lower extremity edema 02/21/2010  . 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 FATIGUE 03/11/2009  . SINUSITIS, CHRONIC 11/27/2008  . Hypothyroidism 07/19/2007  . Anxiety state 07/19/2007  . Allergic rhinitis 07/19/2007  . DIVERTICULOSIS, COLON 07/19/2007  . DEGENERATIVE DISC DISEASE 07/19/2007  . COLONIC POLYPS, HX OF 07/19/2007    History obtained from: chart review and patient.  Sareena is a 65 y.o. female presenting for a follow up visit.  She was last seen in February 2022 for evaluation of pruritus.  She had testing that was positive only to dust mites.  We did testing to the most common foods and they were all negative.  We stopped the hydroxyzine and started her on Allegra 1 to  2 tablets twice daily as well as Pepcid twice daily.  We also started her on Eucerin and triamcinolone twice daily to her entire body.  Since the last visit, her itching has gotten better. She has had the exterminator under control and now she has ants after her. She has no indoor plants, but they just tend to come into the house. She is using the triamcinolone mixed with Eucerin. She is using the [place] and Pepcid. She does report some red spots on her skin. She  wants some kind of injection to keep the itching and rash under control.   Asthma/Respiratory Symptom History: She is not using her rescue inhaler at all. She denies any nighttime coughing. She has not been to the ED at all for her symptoms. Skylee's asthma has been well controlled. She has not required rescue medication, experienced nocturnal awakenings due to lower respiratory symptoms, nor have activities of daily living been limited. She has required no Emergency Department or Urgent Care visits for her asthma. She has required zero courses of systemic steroids for asthma exacerbations since the last visit. ACT score today is 25, indicating excellent asthma symptom control.   Otherwise, there have been no changes to her past medical history, surgical history, family history, or social history.    Review of Systems  Constitutional: Negative.  Negative for chills, fever, malaise/fatigue and weight loss.  HENT: Negative for congestion, ear discharge, ear pain and sinus pain.   Eyes: Negative for pain, discharge and redness.  Respiratory: Negative for cough, sputum production, shortness of breath and wheezing.   Cardiovascular: Negative.  Negative for chest pain and palpitations.  Gastrointestinal: Negative for abdominal pain, constipation, diarrhea, heartburn, nausea and vomiting.  Skin: Positive for itching and rash.  Neurological: Negative for dizziness and headaches.  Endo/Heme/Allergies: Negative for environmental allergies. Does not bruise/bleed easily.       Objective:   Blood pressure 100/72, pulse 75, temperature 98.2 F (36.8 C), temperature source Temporal, resp. rate 20, SpO2 99 %. There is no height or weight on file to calculate BMI.   Physical Exam:  Physical Exam Constitutional:      Appearance: She is well-developed.  HENT:     Head: Normocephalic and atraumatic.     Right Ear: Tympanic membrane, ear canal and external ear normal.     Left Ear: Tympanic membrane,  ear canal and external ear normal.     Nose: No nasal deformity, septal deviation, mucosal edema or rhinorrhea.     Right Turbinates: Enlarged and swollen.     Left Turbinates: Enlarged and swollen.     Right Sinus: No maxillary sinus tenderness or frontal sinus tenderness.     Left Sinus: No maxillary sinus tenderness or frontal sinus tenderness.     Mouth/Throat:     Mouth: Mucous membranes are not pale and not dry.     Pharynx: Uvula midline.  Eyes:     General:        Right eye: No discharge.        Left eye: No discharge.     Conjunctiva/sclera: Conjunctivae normal.     Right eye: Right conjunctiva is not injected. No chemosis.    Left eye: Left conjunctiva is not injected. No chemosis.    Pupils: Pupils are equal, round, and reactive to light.  Cardiovascular:     Rate and Rhythm: Normal rate and regular rhythm.     Heart sounds: Normal heart sounds.  Pulmonary:     Effort:  Pulmonary effort is normal. No tachypnea, accessory muscle usage or respiratory distress.     Breath sounds: Normal breath sounds. No wheezing, rhonchi or rales.  Chest:     Chest wall: No tenderness.  Lymphadenopathy:     Cervical: No cervical adenopathy.  Skin:    General: Skin is warm.     Capillary Refill: Capillary refill takes less than 2 seconds.     Coloration: Skin is not pale.     Findings: No abrasion, erythema, petechiae or rash. Rash is not papular, urticarial or vesicular.     Comments: Urticaria on the bilateral arms. Multiple excoriations.    Neurological:     Mental Status: She is alert.      Spirometry: Normal FEV1, FVC, and FEV1/FVC ratio. There is no scooping suggestive of obstructive disease.    Patient received 300 mg of Xolair and was monitored for 30 minutes.  She tolerated this without any adverse side effects.        Salvatore Marvel, MD  [facility]

## 2020-07-08 NOTE — Addendum Note (Signed)
Addended by: Katherina Right D on: 07/08/2020 04:34 PM   Modules accepted: Orders

## 2020-07-08 NOTE — Patient Instructions (Addendum)
1. Mild intermittent asthma, uncomplicated - Lung testing looked awesome - We are not going to make any changes at this time. - Continue with albuterol 2 puffs every 4-6 hours as needed.  2. Pruritus with urticaria - We are going to give you a sample of Xolair today. - Tammy will be reaching out to you regarding the approval process. - Continue the hydroxyzine and start the following:  AM: Allegra 1-2 tablets + Pepcid 20mg   PM: Allegra 1-2 tablets + Pepcid 20mg   - This regimen should last longer and provide better control of the itching. - Continue with the use of Eucerin + triamcinolone ointment ONCE daily to your entire body. - We can wean medications over time as the Xolair takes effect (sometimes it can take up to 3 doses to feel an effect).  3. Return in about 3 months (around 10/08/2020).    Please inform us of any Emergency Department visits, hospitalizations, or changes in symptoms. Call us before going to the ED for breathing or allergy symptoms since we might be able to fit you in for a sick visit. Feel free to contact us anytime with any questions, problems, or concerns.  It was a pleasure to see you again today!  Websites that have reliable patient information: 1. American Academy of Asthma, Allergy, and Immunology: www.aaaai.org 2. Food Allergy Research and Education (FARE): foodallergy.org 3. Mothers of Asthmatics: http://www.asthmacommunitynetwork.org 4. American College of Allergy, Asthma, and Immunology: www.acaai.org   COVID-19 Vaccine Information can be found at: ShippingScam.co.uk For questions related to vaccine distribution or appointments, please email [email or web address] or call [phone].   We realize that you might be concerned about having an allergic reaction to the COVID19 vaccines. To help with that concern, WE ARE OFFERING THE COVID19 VACCINES IN OUR OFFICE! Ask the front desk for dates!      "Like" [place] on Facebook and Instagram for our latest updates!      A healthy democracy works best when [employer] participate! Make sure you are registered to vote! If you have moved or changed any of your contact information, you will need to get this updated before voting!  In some cases, you MAY be able to register to vote online: [email or web address]

## 2020-07-12 ENCOUNTER — Telehealth: Payer: Self-pay | Admitting: *Deleted

## 2020-07-12 NOTE — Telephone Encounter (Signed)
-----   Message from Valentina Shaggy, MD sent at 07/08/2020  1:32 PM EDT ----- Xolair 300 mg sample given today.

## 2020-07-12 NOTE — Telephone Encounter (Signed)
Called patient and advised approval, copay card and submit for Xolair

## 2020-07-21 ENCOUNTER — Encounter: Payer: Self-pay | Admitting: Internal Medicine

## 2020-07-21 ENCOUNTER — Other Ambulatory Visit: Payer: Self-pay

## 2020-07-21 ENCOUNTER — Telehealth: Payer: Self-pay | Admitting: Internal Medicine

## 2020-07-21 MED ORDER — PANTOPRAZOLE SODIUM 40 MG PO TBEC: 40.0000 mg | DELAYED_RELEASE_TABLET | Freq: Every day | ORAL | 3 refills | Status: DC

## 2020-07-21 NOTE — Telephone Encounter (Signed)
Patient has asked to get two prescriptions changed to the [facility] pharmacy    pantoprazole (PROTONIX) 40 MG tablet  furosemide (LASIX) 40 MG tablet    [facility] [place] [place] [facility]

## 2020-07-22 ENCOUNTER — Other Ambulatory Visit: Payer: Self-pay

## 2020-07-22 MED ORDER — FUROSEMIDE 40 MG PO TABS: 40.0000 mg | ORAL_TABLET | Freq: Every day | ORAL | 2 refills | Status: DC | PRN

## 2020-07-22 NOTE — Telephone Encounter (Signed)
Ok [facility] 

## 2020-07-22 NOTE — Telephone Encounter (Signed)
Patient notified by detailed message (per DPR).

## 2020-07-23 ENCOUNTER — Telehealth: Payer: Self-pay | Admitting: *Deleted

## 2020-07-23 NOTE — Telephone Encounter (Signed)
Patient had called me regarding Xolair with many questions. She advised she was allergic to latex and was told by pharmacy Xolair has latex in it. I did look up Xolair and the rubber needle in prefilled syringe has latex not Xolair and advised her of same.  She advised that she has been fatigued also and I advised her that was probably not form Xolair and explained how it works.  She did question why the pharmacy wanted to know her current meds and I advised her that was patient of new patient process for them and we do not always have space to list all meds and they would want to confirm those with her.  She is suppose to reach back out to pharmacy to ok delivery for next injections

## 2020-07-23 NOTE — Telephone Encounter (Signed)
Oh my. Thank you for your patience with our patients!   Salvatore Marvel, MD [facility]

## 2020-07-26 ENCOUNTER — Telehealth: Payer: Self-pay | Admitting: Internal Medicine

## 2020-07-26 ENCOUNTER — Ambulatory Visit: Payer: No Typology Code available for payment source | Admitting: Cardiology

## 2020-07-26 ENCOUNTER — Encounter: Payer: Self-pay | Admitting: Cardiology

## 2020-07-26 ENCOUNTER — Other Ambulatory Visit: Payer: Self-pay

## 2020-07-26 VITALS — BP 112/74 | HR 79 | Ht 63.0 in | Wt 192.0 lb

## 2020-07-26 DIAGNOSIS — E785 Hyperlipidemia, unspecified: Secondary | ICD-10-CM | POA: Diagnosis not present

## 2020-07-26 DIAGNOSIS — R5383 Other fatigue: Secondary | ICD-10-CM | POA: Diagnosis not present

## 2020-07-26 DIAGNOSIS — R002 Palpitations: Secondary | ICD-10-CM

## 2020-07-26 DIAGNOSIS — I83893 Varicose veins of bilateral lower extremities with other complications: Secondary | ICD-10-CM

## 2020-07-26 MED ORDER — FUROSEMIDE 40 MG PO TABS: 40.0000 mg | ORAL_TABLET | Freq: Every day | ORAL | 2 refills | Status: DC | PRN

## 2020-07-26 MED ORDER — PANTOPRAZOLE SODIUM 40 MG PO TBEC: 40.0000 mg | DELAYED_RELEASE_TABLET | Freq: Every day | ORAL | 3 refills | Status: DC

## 2020-07-26 NOTE — Patient Instructions (Signed)
Medication Instructions:   No changes  *If you need a refill on your cardiac medications before your next appointment, please call your pharmacy*   Lab Work:  Not needed   Testing/Procedures:  Not needed  Follow-Up: At [facility], you and your health needs are our priority.  As part of our continuing mission to provide you with exceptional heart care, we have created designated Provider Care Teams.  These Care Teams include your primary Cardiologist (physician) and Advanced Practice Providers (APPs -  Physician Assistants and Nurse Practitioners) who all work together to provide you with the care you need, when you need it.     Your next appointment:   12 month(s)  The format for your next appointment:   In Person  Provider:   Dr David Harding     

## 2020-07-26 NOTE — Telephone Encounter (Signed)
Patient called and said that   furosemide (LASIX) 40 MG tablet  pantoprazole (PROTONIX) 40 MG tablet  Need to be sent to [place], [place] to [facility]istered [facility]. Please advise.
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
## 2020-07-28 ENCOUNTER — Telehealth: Payer: Self-pay | Admitting: Allergy & Immunology

## 2020-07-28 NOTE — Telephone Encounter (Signed)
Pt has had some shortness of breath for a couple of days and using albuterol. She stated she had Advair that was expired from another doctor. She has felt fatigued and short of breath. She stated she can not take benadryl it causes irregular heartbeat. I will call her tomorrow to see how she is.

## 2020-07-28 NOTE — Telephone Encounter (Signed)
Pt request a call back, she has concerns about the allegra she is taking and also her emergency action plan.

## 2020-07-29 NOTE — Telephone Encounter (Signed)
Spoke with patient and she is doing much better today.

## 2020-07-29 NOTE — Telephone Encounter (Signed)
Thank you Rolla Plate!  If she is relatively stable, we could save her when she comes in next week for her Xolair on either mine or Chrissie's schedule.   Salvatore Marvel, MD All[facility] [place] [facility] [place]

## 2020-08-02 ENCOUNTER — Telehealth: Payer: Self-pay | Admitting: Internal Medicine

## 2020-08-02 ENCOUNTER — Other Ambulatory Visit (INDEPENDENT_AMBULATORY_CARE_PROVIDER_SITE_OTHER): Payer: No Typology Code available for payment source

## 2020-08-02 DIAGNOSIS — E039 Hypothyroidism, unspecified: Secondary | ICD-10-CM

## 2020-08-02 LAB — TSH: TSH: 0.85 u[IU]/mL (ref 0.35–4.50)

## 2020-08-02 NOTE — Telephone Encounter (Signed)
Notified patient that lab orders were added

## 2020-08-02 NOTE — Telephone Encounter (Signed)
Patient went for lab work today. The lab only collected for TSH. She is wanting to have her T3 and T4 tested as well. Can you put in an order for T3 and T4?   Please advise.   Best contact [phone]

## 2020-08-02 NOTE — Telephone Encounter (Signed)
Ok this is done 

## 2020-08-04 ENCOUNTER — Other Ambulatory Visit: Payer: Self-pay

## 2020-08-04 ENCOUNTER — Encounter: Payer: Self-pay | Admitting: Internal Medicine

## 2020-08-04 ENCOUNTER — Ambulatory Visit (INDEPENDENT_AMBULATORY_CARE_PROVIDER_SITE_OTHER): Payer: No Typology Code available for payment source | Admitting: *Deleted

## 2020-08-04 DIAGNOSIS — L501 Idiopathic urticaria: Secondary | ICD-10-CM | POA: Diagnosis not present

## 2020-08-04 DIAGNOSIS — J4540 Moderate persistent asthma, uncomplicated: Secondary | ICD-10-CM

## 2020-08-04 NOTE — Addendum Note (Signed)
Addended by: Katherina Right D on: 08/04/2020 04:50 PM   Modules accepted: Orders

## 2020-08-04 NOTE — Telephone Encounter (Signed)
Staff to contact lab - ? Was the free t4 and free t3 able to be added on, b/c I havent seen result, thanks

## 2020-08-05 ENCOUNTER — Other Ambulatory Visit (INDEPENDENT_AMBULATORY_CARE_PROVIDER_SITE_OTHER): Payer: No Typology Code available for payment source

## 2020-08-05 ENCOUNTER — Encounter: Payer: Self-pay | Admitting: Internal Medicine

## 2020-08-05 DIAGNOSIS — E039 Hypothyroidism, unspecified: Secondary | ICD-10-CM | POA: Diagnosis not present

## 2020-08-05 LAB — T3, FREE: T3, Free: 2.9 pg/mL (ref 2.3–4.2)

## 2020-08-05 LAB — T4, FREE: Free T4: 1.16 ng/dL (ref 0.60–1.60)

## 2020-08-06 NOTE — Telephone Encounter (Signed)
Update her last office note 07/26/20 in the office  - medication list

## 2020-08-08 ENCOUNTER — Encounter: Payer: Self-pay | Admitting: Cardiology

## 2020-08-08 NOTE — Assessment & Plan Note (Signed)
Pretty well controlled now.  Her stress anxiety level seem to have improved.  Not really requiring anxiolytics. More concerned because she is been taking Allegra-D for her allergies, so recommended that she does not take medications with the.  Simply take the regular antihistamine without decongestant.[name]

## 2020-08-08 NOTE — Assessment & Plan Note (Signed)
Back on rosuvastatin.  Excellent lipids

## 2020-08-08 NOTE — Assessment & Plan Note (Signed)
Being Followed by [facility].  Taking PRN Lasix, and wearing support hose, elevating her legs.  No associated cardiac symptoms.  Mostly venous stasis.

## 2020-08-08 NOTE — Assessment & Plan Note (Signed)
This Very well related to hypothyroidism based on elevated TSH level.  She also thinks she may have had COVID-19 back in January, but really had no other symptoms other than what she felt was her allergies.  I suspect that this is probably not the cause.

## 2020-08-09 ENCOUNTER — Telehealth: Payer: Self-pay | Admitting: Internal Medicine

## 2020-08-09 NOTE — Telephone Encounter (Signed)
hydrOXYzine (ATARAX/VISTARIL) 10 MG tablet [[id number] [facility] [place] [place] to [address] [place] [address] Phone:  [phone]  Fax:  [phone]      Requesting a refill Last- 02.10.22 Next- 05.10.22

## 2020-08-10 MED ORDER — HYDROXYZINE HCL 10 MG PO TABS: 10.0000 mg | ORAL_TABLET | Freq: Three times a day (TID) | ORAL | 1 refills | Status: DC | PRN

## 2020-08-10 NOTE — Telephone Encounter (Signed)
Done erx 

## 2020-08-31 ENCOUNTER — Ambulatory Visit: Payer: No Typology Code available for payment source | Admitting: Internal Medicine

## 2020-08-31 ENCOUNTER — Other Ambulatory Visit: Payer: Self-pay

## 2020-08-31 ENCOUNTER — Encounter: Payer: Self-pay | Admitting: Internal Medicine

## 2020-08-31 VITALS — BP 110/72 | HR 86 | Temp 97.9°F | Ht 63.0 in | Wt 191.0 lb

## 2020-08-31 DIAGNOSIS — E039 Hypothyroidism, unspecified: Secondary | ICD-10-CM | POA: Diagnosis not present

## 2020-08-31 DIAGNOSIS — R300 Dysuria: Secondary | ICD-10-CM

## 2020-08-31 DIAGNOSIS — F32A Depression, unspecified: Secondary | ICD-10-CM

## 2020-08-31 LAB — URINALYSIS, ROUTINE W REFLEX MICROSCOPIC
Bilirubin Urine: NEGATIVE
Hgb urine dipstick: NEGATIVE
Ketones, ur: NEGATIVE
Nitrite: NEGATIVE
RBC / HPF: NONE SEEN (ref 0–?)
Specific Gravity, Urine: <=1.005 — AB (ref 1.000–1.030)
Total Protein, Urine: NEGATIVE
Urine Glucose: NEGATIVE
Urobilinogen, UA: 0.2 (ref 0.0–1.0)
pH: 6 (ref 5.0–8.0)

## 2020-08-31 LAB — T4, FREE: Free T4: 0.85 ng/dL (ref 0.60–1.60)

## 2020-08-31 LAB — T3, FREE: T3, Free: 2.5 pg/mL (ref 2.3–4.2)

## 2020-08-31 LAB — TSH: TSH: 2.19 u[IU]/mL (ref 0.35–4.50)
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
## 2020-08-31 NOTE — Patient Instructions (Signed)
Please continue all other medications as before, and refills have been done if requested.  Please have the pharmacy call with any other refills you may need.  Please continue your efforts at being more active, low cholesterol diet, and weight control.  You are otherwise up to date with prevention measures today.  Please keep your appointments with your specialists as you may have planned  Please go to the LAB at the blood drawing area for the tests to be done  You will be contacted by phone if any changes need to be made immediately.  Otherwise, you will receive a letter about your results with an explanation, but please check with MyChart first.  Please remember to sign up for MyChart if you have not done so, as this will be important to you in the future with finding out test results, communicating by private email, and scheduling acute appointments online when needed.

## 2020-09-01 ENCOUNTER — Encounter: Payer: Self-pay | Admitting: Internal Medicine

## 2020-09-01 ENCOUNTER — Ambulatory Visit (INDEPENDENT_AMBULATORY_CARE_PROVIDER_SITE_OTHER): Payer: No Typology Code available for payment source

## 2020-09-01 DIAGNOSIS — L501 Idiopathic urticaria: Secondary | ICD-10-CM | POA: Diagnosis not present

## 2020-09-01 LAB — URINE CULTURE

## 2020-09-07 ENCOUNTER — Encounter: Payer: Self-pay | Admitting: Internal Medicine

## 2020-09-07 NOTE — Assessment & Plan Note (Signed)
Mild to mod, for urine studies,  to f/u any worsening symptoms or concerns

## 2020-09-07 NOTE — Assessment & Plan Note (Signed)
Stable overall, no current tx needed,  to f/u any worsening symptoms or concerns

## 2020-09-07 NOTE — Assessment & Plan Note (Signed)
Lab Results  Component Value Date   TSH 2.19 08/31/2020   Stable, pt to continue levothyroxine

## 2020-09-09 ENCOUNTER — Other Ambulatory Visit: Payer: Self-pay | Admitting: Internal Medicine

## 2020-09-14 ENCOUNTER — Telehealth: Payer: Self-pay | Admitting: Internal Medicine

## 2020-09-14 MED ORDER — ZOLPIDEM TARTRATE 10 MG PO TABS: 10.0000 mg | ORAL_TABLET | Freq: Every evening | ORAL | 1 refills | Status: DC | PRN

## 2020-09-14 NOTE — Telephone Encounter (Signed)
Patient called and said that [place] has been trying to reach Dr. Jenny Reichmann in regards to zolpidem (AMBIEN) 10 MG tablet. Please advise   Phone: [phone]

## 2020-09-27 NOTE — Progress Notes (Signed)
Virtual Visit via Video Note  I connected with [name] on 09/27/20 at  2:40 PM EDT by a video enabled telemedicine application and verified that I am speaking with the correct person using two identifiers.   I discussed the limitations of evaluation and management by telemedicine and the availability of in person appointments. The patient expressed understanding and agreed to proceed.  Present for the visit:  Myself, Dr Billey Gosling, Johnn Hai.  The patient is currently at home and I am in the office.    No referring provider.    History of Present Illness: This is an acute visit for runny nose, cough.  Her symptoms started 1 week ago.  Her symptoms started 5/31 after cleaning out her mom's house and there was a cat there.  Her asthma went crazy.   She had a productive cough, SOB on occasion  Went to urgent care 09/24/20 - dx with allergic rhinitis due to animal dander and asthma exacerbation.  rx'd prednisone 40 mg x 5 days, then 20 mg x 5 days, afrin nose spray and mucinex sore throat spray   She went for a covid test yesterday at [facility], 6/6.  She is negative.     She is doing better since starting prednisone.  She is still having productive cough with discolored sputum, occ wheeze, some SOB, congestion, sinus pain.  She wonders if she needs an abx  She sees an allergist regularly and gets xolair injections.    Review of Systems  Constitutional: Negative for chills and fever.  HENT: Positive for congestion and sinus pain. Negative for ear pain and sore throat.   Respiratory: Positive for cough, sputum production (green discoloration), shortness of breath and wheezing (occ).   Musculoskeletal: Negative for myalgias.  Neurological: Negative for headaches.     Social History   Socioeconomic History  . Marital status: Married    Spouse name: Not on file  . Number of children: 3  . Years of education: 65  . Highest education level: Associate degree: academic program   Occupational History  . Occupation: Secondary    Employer: [employer]    Comment: Retired  Tobacco Use  . Smoking status: Former Smoker    Years: 4.00    Types: Cigarettes    Quit date: 09/13/1979    Years since quitting: 41.0  . Smokeless tobacco: Never Used  Vaping Use  . Vaping Use: Never used  Substance and Sexual Activity  . Alcohol use: Yes    Comment: rare  . Drug use: Never  . Sexual activity: Not on file  Other Topics Concern  . Not on file  Social History Narrative   Pt is married with 3 children, one lives with her.  She has 4 grandchildren.  Very rarely drinks wine.  Quit smoking over 38 years ago.   Social Determinants of Health   Financial Resource Strain: Not on file  Food Insecurity: Not on file  Transportation Needs: Not on file  Physical Activity: Not on file  Stress: Not on file  Social Connections: Not on file     Observations/Objective: Appears well in NAD Breathing normally, intermittent cough  Assessment and Plan:  Acute bronchitis, asthma exacerbation Acute Related to cleaning out her mom's house and being around a cat covid neg Has improved with prednisone and cold medication, allergy medications Still with productive cough with discolored sputum, sob, wheeze and congestion - concern for bacterial cause Start doxycycline 100 mg BID x  10 days Complete prednisone Continue allergy meds, inhalers Continue otc cold meds Call if no improvement   Follow Up Instructions:    I discussed the assessment and treatment plan with the patient. The patient was provided an opportunity to ask questions and all were answered. The patient agreed with the plan and demonstrated an understanding of the instructions.   The patient was advised to call back or seek an in-person evaluation if the symptoms worsen or if the condition fails to improve as anticipated.    Binnie Rail, MD

## 2020-09-28 ENCOUNTER — Encounter: Payer: Self-pay | Admitting: Internal Medicine

## 2020-09-28 ENCOUNTER — Telehealth (INDEPENDENT_AMBULATORY_CARE_PROVIDER_SITE_OTHER): Payer: No Typology Code available for payment source | Admitting: Internal Medicine

## 2020-09-28 DIAGNOSIS — J209 Acute bronchitis, unspecified: Secondary | ICD-10-CM

## 2020-09-28 DIAGNOSIS — J4541 Moderate persistent asthma with (acute) exacerbation: Secondary | ICD-10-CM | POA: Diagnosis not present

## 2020-09-28 MED ORDER — DOXYCYCLINE HYCLATE 100 MG PO TABS: 100.0000 mg | ORAL_TABLET | Freq: Two times a day (BID) | ORAL | 0 refills | Status: AC

## 2020-09-30 ENCOUNTER — Ambulatory Visit (INDEPENDENT_AMBULATORY_CARE_PROVIDER_SITE_OTHER): Payer: No Typology Code available for payment source

## 2020-09-30 ENCOUNTER — Other Ambulatory Visit: Payer: Self-pay

## 2020-09-30 DIAGNOSIS — L501 Idiopathic urticaria: Secondary | ICD-10-CM | POA: Diagnosis not present

## 2020-10-14 ENCOUNTER — Encounter: Payer: Self-pay | Admitting: Allergy & Immunology

## 2020-10-14 ENCOUNTER — Ambulatory Visit: Payer: No Typology Code available for payment source | Admitting: Allergy & Immunology

## 2020-10-14 ENCOUNTER — Other Ambulatory Visit: Payer: Self-pay

## 2020-10-14 VITALS — BP 118/78 | HR 80 | Temp 97.7°F | Resp 16

## 2020-10-14 DIAGNOSIS — J4540 Moderate persistent asthma, uncomplicated: Secondary | ICD-10-CM | POA: Diagnosis not present

## 2020-10-14 DIAGNOSIS — L299 Pruritus, unspecified: Secondary | ICD-10-CM

## 2020-10-14 DIAGNOSIS — L508 Other urticaria: Secondary | ICD-10-CM | POA: Diagnosis not present

## 2020-10-14 NOTE — Progress Notes (Signed)
FOLLOW UP  Date of Service/Encounter:  10/14/20   Assessment:   Pruritis - with sensitization to dust mite    Chronic urticaria - with incomplete resolution with the use of suppressive antihistamines   Mild eosinophilia (AEC 500)   Intermittent asthma, uncomplicated  Plan/Recommendations:   1. Moderate intermittent asthma, uncomplicated - Lung testing not done today. - We are going to use Advair aggressively when you have coughing/wheezing episodes to PREVENT the need for prednisone. - Daily controller medication(s): NOTHING - Rescue medications: albuterol 4 puffs every 4-6 hours as needed WITH COUGHING/WHEEZING - Changes during respiratory infections or worsening symptoms: Add on 250/63mcg to 1 puff twice daily for TWO WEEKS TO PREVENT PREDNISONE. - Asthma control goals:  * Full participation in all desired activities (may need albuterol before activity) * Albuterol use two time or less a week on average (not counting use with activity) * Cough interfering with sleep two time or less a month * Oral steroids no more than once a year * No hospitalizations  2. Pruritus with urticaria - We can increase the Xolair to every 2 weeks to see if this can help (Tammy will call when that it approved).  - Maybe once the bugs are under better control, the itching will be more manageable and we can decrease back to every 4 weeks. - Continue the hydroxyzine 3-4 times daily.  - Continue with the following:  AM: Allegra 1-2 tablets + Pepcid 20mg   PM: Allegra 1-2 tablets + Pepcid 20mg   - Continue with the use of Eucerin + triamcinolone ointment ONCE daily to your entire body. - We will send you to [place] for a second opinion to see if they have other ideas.   3. Return in about 3 months (around 01/14/2021).   Subjective:   MONI ROTHROCK is a 65 y.o. female presenting today for follow up of  Chief Complaint  Patient presents with   Asthma    Pt said Arvid Right is helping  some but she is not great Ants and flying ants are biting her and causing breathing issues     ALEXANDERIA GORBY has a history of the following: Patient Active Problem List   Diagnosis Date Noted   Dysuria 08/31/2020   Chronic pruritus 05/30/2020   Rash 04/03/2020   Chest pain 11/14/2019   Insomnia 11/14/2019   Pelvic prolapse 10/06/2019   Leg pain, bilateral 05/01/2019   Acute diverticulitis 12/23/2018   Asthma 10/02/2018   Pain in right hand 07/12/2018   Pain in both feet 11/08/2017   Nodule of chest wall 10/11/2017   Mass of right side of neck 10/11/2017   Varicose veins of bilateral lower extremities with other complications 25/85/2778   Complication of anesthesia    Right groin pain 04/21/2017   Hyperlipidemia LDL goal <100 03/23/2017   Dorsalgia 03/01/2017   Rapid palpitations 03/01/2017   Preop cardiovascular exam 03/01/2017   Right lumbar radiculopathy 08/15/2016   Rib pain on right side 03/03/2016   Nosebleed 03/03/2016   Acute colitis 08/30/2013   GERD (gastroesophageal reflux disease) 08/14/2012   Pain and swelling of lower leg 10/23/2011   Depression 07/10/2011   History of renal stone 04/15/2011   Pleural effusion 04/14/2011   Impaired glucose tolerance 04/14/2011   Hypokalemia 04/14/2011   Chronic low back pain 03/29/2011   Obesity 03/29/2011   Encounter for well adult exam with abnormal findings 12/30/2010   THYROID NODULE, RIGHT 02/21/2010   NECK PAIN, RIGHT 02/21/2010  Bilateral lower extremity edema 02/21/2010   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  She is a somewhat poor historian.  Madysen is a 65 y.o. female presenting for a follow up visit. She was last  seen in March 2022.  At that time, her lung testing looked great.  We continue with albuterol 2 puffs every 4-6 hours as needed.  As far as I knew, she was not on a controller medicine time.  For her pruritus with urticaria, we gave her a sample of Xolair and continued Allegra 1 to 2 tablets twice daily and Pepcid twice daily.  We continued with Eucerin and triamcinolone once daily for her entire body, weaning as tolerated.  Since last visit, she was on very well.  Asthma/Respiratory Symptom History: Evidently, she was on Advair at some point.  She has been using this on an as-needed basis.  I do not have this mentioned in any of my notes.  She states this was prescribed by her primary care provider. She thinks that her Advair might have one more month before it expires.  She is using her albuterol twice daily.  She thought this was what we want her to do.  She is using this every day despite how she is feeling. She does think that the Xolair is helping with her breathing.   Urticaria symptom History: She remains on the Xolair every 4 weeks.  This has helped her asthma quite a bit.  However, she has not noticed a huge improvement with her itching. She is concerned because there are bugs in her neighborhood from all of the construction. She has the bug company come out [employer]) come out quarterly to control this. She gets bit by these and it causes itching.  She does have a dermatologist, but the dermatologist focuses more on the venous stasis her leg, at least according to the patient.  She has not asked the dermatologist about the itchy spot on her arms with bug bites.  Allergic Rhinitis Symptom History: She did have a sore throat suddenly last week. She was concerned and had a negative COVID test. She got a steroid burst. She was placed on prednisone and docycyline. She gets prednisone around two times per year. She was also placed on Afrin.   Otherwise, there have been no changes to her  past medical history, surgical history, family history, or social history.    Review of Systems  Constitutional: Negative.  Negative for chills, fever, malaise/fatigue and weight loss.  HENT: Negative.  Negative for congestion, ear discharge and ear pain.   Eyes:  Negative for pain, discharge and redness.  Respiratory:  Negative for cough, sputum production, shortness of breath and wheezing.   Cardiovascular: Negative.  Negative for chest pain and palpitations.  Gastrointestinal:  Negative for abdominal pain, constipation, diarrhea, heartburn, nausea and vomiting.  Skin:  Positive for itching and rash.  Neurological:  Negative for dizziness and headaches.  Endo/Heme/Allergies:  Negative for environmental allergies. Does not bruise/bleed easily.      Objective:   Blood pressure 118/78, pulse 80, temperature 97.7 F (36.5 C), temperature source Temporal, resp. rate 16, SpO2 99 %. There is no height or weight on file to  calculate BMI.   Physical Exam:  Physical Exam Constitutional:      Appearance: She is well-developed.  HENT:     Head: Normocephalic and atraumatic.     Right Ear: Tympanic membrane, ear canal and external ear normal.     Left Ear: Tympanic membrane, ear canal and external ear normal.     Nose: No nasal deformity, septal deviation, mucosal edema or rhinorrhea.     Right Turbinates: Enlarged and swollen.     Left Turbinates: Enlarged and swollen.     Right Sinus: No maxillary sinus tenderness or frontal sinus tenderness.     Left Sinus: No maxillary sinus tenderness or frontal sinus tenderness.     Mouth/Throat:     Mouth: Mucous membranes are not pale and not dry.     Pharynx: Uvula midline.  Eyes:     General: Lids are normal. No allergic shiner.       Right eye: No discharge.        Left eye: No discharge.     Conjunctiva/sclera: Conjunctivae normal.     Right eye: Right conjunctiva is not injected. No chemosis.    Left eye: Left conjunctiva is not  injected. No chemosis.    Pupils: Pupils are equal, round, and reactive to light.  Cardiovascular:     Rate and Rhythm: Normal rate and regular rhythm.     Heart sounds: Normal heart sounds.  Pulmonary:     Effort: Pulmonary effort is normal. No tachypnea, accessory muscle usage or respiratory distress.     Breath sounds: Normal breath sounds. No wheezing, rhonchi or rales.  Chest:     Chest wall: No tenderness.  Lymphadenopathy:     Cervical: No cervical adenopathy.  Skin:    General: Skin is warm.     Capillary Refill: Capillary refill takes less than 2 seconds.     Coloration: Skin is not pale.     Findings: No abrasion, erythema, petechiae or rash. Rash is not papular, urticarial or vesicular.     Comments: She does have some excoriations on her arm with some pustular lesions.  There is no active drainage.  Neurological:     Mental Status: She is alert.  Psychiatric:        Behavior: Behavior is cooperative.     Diagnostic studies: none    Salvatore Marvel, MD  [facility]

## 2020-10-14 NOTE — Patient Instructions (Addendum)
1. Moderate intermittent asthma, uncomplicated - Lung testing not done today. - We are going to use Advair aggressively when you have coughing/wheezing episodes to PREVENT the need for prednisone. - Daily controller medication(s): NOTHING - Rescue medications: albuterol 4 puffs every 4-6 hours as needed WITH COUGHING/WHEEZING - Changes during respiratory infections or worsening symptoms: Add on 250/38mcg to 1 puff twice daily for TWO WEEKS TO PREVENT PREDNISONE. - Asthma control goals:  * Full participation in all desired activities (may need albuterol before activity) * Albuterol use two time or less a week on average (not counting use with activity) * Cough interfering with sleep two time or less a month * Oral steroids no more than once a year * No hospitalizations   2. Pruritus with urticaria - We can increase the Xolair to every 2 weeks to see if this can help.  - Maybe once the bugs are under better control, the itching will be more manageable and we can decrease back to every 4 weeks. - Continue the hydroxyzine 3-4 times daily.  - Continue with the following:  AM: Allegra 1-2 tablets + Pepcid 20mg   PM: Allegra 1-2 tablets + Pepcid 20mg   - Continue with the use of Eucerin + triamcinolone ointment ONCE daily to your entire body. -   3. No follow-ups on file.    Please inform us of any Emergency Department visits, hospitalizations, or changes in symptoms. Call us before going to the ED for breathing or allergy symptoms since we might be able to fit you in for a sick visit. Feel free to contact us anytime with any questions, problems, or concerns.  It was a pleasure to see you again today!  Websites that have reliable patient information: 1. American Academy of Asthma, Allergy, and Immunology: www.aaaai.org 2. Food Allergy Research and Education (FARE): foodallergy.org 3. Mothers of Asthmatics: http://www.asthmacommunitynetwork.org 4. American College of Allergy, Asthma, and  Immunology: www.acaai.org   COVID-19 Vaccine Information can be found at: ShippingScam.co.uk For questions related to vaccine distribution or appointments, please email [email or web address] or call [phone].   We realize that you might be concerned about having an allergic reaction to the COVID19 vaccines. To help with that concern, WE ARE OFFERING THE COVID19 VACCINES IN OUR OFFICE! Ask the front desk for dates!     "Like" [place] on Facebook and Instagram for our latest updates!      A healthy democracy works best when [employer] participate! Make sure you are registered to vote! If you have moved or changed any of your contact information, you will need to get this updated before voting!  In some cases, you MAY be able to register to vote online: [email or web address]

## 2020-10-15 ENCOUNTER — Other Ambulatory Visit (HOSPITAL_COMMUNITY): Payer: Self-pay

## 2020-10-15 ENCOUNTER — Encounter: Payer: Self-pay | Admitting: Allergy & Immunology

## 2020-10-15 ENCOUNTER — Telehealth: Payer: Self-pay | Admitting: Allergy and Immunology

## 2020-10-15 ENCOUNTER — Other Ambulatory Visit: Payer: Self-pay

## 2020-10-15 MED ORDER — FLUTICASONE-SALMETEROL 250-50 MCG/ACT IN AEPB: INHALATION_SPRAY | RESPIRATORY_TRACT | 5 refills | Status: DC

## 2020-10-15 NOTE — Telephone Encounter (Signed)
Rx refill for Advair sent in to pharmacy, spoke with pt Dermatogist office, and she has to be seen in order to release medical records.

## 2020-10-15 NOTE — Telephone Encounter (Signed)
Patient states her RX for Fluticasone-Salmeterol (ADVAIR DISKUS) 250-50 MCG/DOSE AEPB [[id number]] is expired needs a new RX written please send to [place]  [phone] please advise

## 2020-10-15 NOTE — Telephone Encounter (Signed)
Pt states she got a call from our office about the name of her Dermatologist- it is H. Fredia Beets. [phone] last seen 04/03/2019 can't be seen until 7/13.

## 2020-10-27 ENCOUNTER — Encounter: Payer: Self-pay | Admitting: Internal Medicine

## 2020-10-27 ENCOUNTER — Other Ambulatory Visit: Payer: Self-pay

## 2020-10-27 ENCOUNTER — Ambulatory Visit (INDEPENDENT_AMBULATORY_CARE_PROVIDER_SITE_OTHER): Payer: No Typology Code available for payment source

## 2020-10-27 DIAGNOSIS — L508 Other urticaria: Secondary | ICD-10-CM

## 2020-10-27 DIAGNOSIS — L501 Idiopathic urticaria: Secondary | ICD-10-CM

## 2020-11-24 ENCOUNTER — Ambulatory Visit (INDEPENDENT_AMBULATORY_CARE_PROVIDER_SITE_OTHER): Payer: No Typology Code available for payment source

## 2020-11-24 ENCOUNTER — Other Ambulatory Visit: Payer: Self-pay

## 2020-11-24 ENCOUNTER — Telehealth: Payer: Self-pay | Admitting: Allergy & Immunology

## 2020-11-24 DIAGNOSIS — L501 Idiopathic urticaria: Secondary | ICD-10-CM

## 2020-11-24 NOTE — Telephone Encounter (Signed)
Pt states her Dermatologist still has not received her medical records.

## 2020-11-26 NOTE — Telephone Encounter (Signed)
Please advise to sending over medical records to [facility] md is her dermatologist

## 2020-11-30 NOTE — Telephone Encounter (Signed)
I never received a signed medical release from her. I need information on dates, address, fax #'s. I will call her this morning and see if I can email her a release. It is best to get a release signed and scanned into the S drive so I have all this information. Thank you.

## 2020-12-16 NOTE — Telephone Encounter (Signed)
Was you able to email her a release

## 2020-12-22 ENCOUNTER — Other Ambulatory Visit: Payer: Self-pay

## 2020-12-22 ENCOUNTER — Ambulatory Visit (INDEPENDENT_AMBULATORY_CARE_PROVIDER_SITE_OTHER): Payer: No Typology Code available for payment source

## 2020-12-22 DIAGNOSIS — L501 Idiopathic urticaria: Secondary | ICD-10-CM

## 2021-01-10 ENCOUNTER — Telehealth: Payer: Self-pay | Admitting: *Deleted

## 2021-01-10 ENCOUNTER — Telehealth: Payer: Self-pay

## 2021-01-10 ENCOUNTER — Encounter: Payer: Self-pay | Admitting: Allergy & Immunology

## 2021-01-10 ENCOUNTER — Other Ambulatory Visit: Payer: Self-pay

## 2021-01-10 ENCOUNTER — Ambulatory Visit: Payer: No Typology Code available for payment source | Admitting: Allergy & Immunology

## 2021-01-10 VITALS — BP 108/70 | HR 86 | Temp 98.2°F | Resp 20 | Wt 190.4 lb

## 2021-01-10 DIAGNOSIS — R300 Dysuria: Secondary | ICD-10-CM

## 2021-01-10 DIAGNOSIS — L501 Idiopathic urticaria: Secondary | ICD-10-CM

## 2021-01-10 DIAGNOSIS — J4540 Moderate persistent asthma, uncomplicated: Secondary | ICD-10-CM

## 2021-01-10 DIAGNOSIS — L299 Pruritus, unspecified: Secondary | ICD-10-CM

## 2021-01-10 MED ORDER — XOLAIR 150 MG ~~LOC~~ SOLR: 300.0000 mg | SUBCUTANEOUS | 11 refills | Status: DC

## 2021-01-10 NOTE — Telephone Encounter (Signed)
Yes that would be best,   I have done the order

## 2021-01-10 NOTE — Telephone Encounter (Signed)
-----   Message from Valentina Shaggy, MD sent at 01/10/2021  3:43 PM EDT ----- Increase Xolair to every two weeks. Also home administration?

## 2021-01-10 NOTE — Patient Instructions (Addendum)
1. Moderate intermittent asthma, uncomplicated - Lung testing looks good today. - I think we are in a good spot from a breathing perspective.  - Daily controller medication(s): NOTHING - Rescue medications: albuterol 4 puffs every 4-6 hours as needed WITH COUGHING/WHEEZING - Changes during respiratory infections or worsening symptoms: Add on Advair 250/6mcg to 1 puff twice daily for TWO WEEKS TO PREVENT PREDNISONE. - Asthma control goals:  * Full participation in all desired activities (may need albuterol before activity) * Albuterol use two time or less a week on average (not counting use with activity) * Cough interfering with sleep two time or less a month * Oral steroids no more than once a year * No hospitalizations  2. Pruritus with urticaria - We are going to increase the Xolair to every 2 weeks to see if this can help.  - Maybe once the bugs are under better control, the itching will be more manageable and we can decrease back to every 4 weeks. - Try Opzelura twice daily to the lesions to see if that helps (non-steroidal cream).  - Let us know if you like it and we can send it in to a specialty pharmacy.  - Continue the hydroxyzine as needed.  - Continue with the following:  AM: Allegra 1-2 tablets + Pepcid 20mg   PM: Allegra 1-2 tablets + Pepcid 20mg   - Continue with the use of Eucerin + triamcinolone ointment ONCE daily to your entire body.  3. Return in about 3 months (around 04/11/2021) in [place].   Please inform us of any Emergency Department visits, hospitalizations, or changes in symptoms. Call us before going to the ED for breathing or allergy symptoms since we might be able to fit you in for a sick visit. Feel free to contact us anytime with any questions, problems, or concerns.  It was a pleasure to see you again today!  Websites that have reliable patient information: 1. American Academy of Asthma, Allergy, and Immunology: www.aaaai.org 2. Food Allergy  Research and Education (FARE): foodallergy.org 3. Mothers of Asthmatics: http://www.asthmacommunitynetwork.org 4. American College of Allergy, Asthma, and Immunology: www.acaai.org   COVID-19 Vaccine Information can be found at: ShippingScam.co.uk For questions related to vaccine distribution or appointments, please email [email or web address] or call [phone].   We realize that you might be concerned about having an allergic reaction to the COVID19 vaccines. To help with that concern, WE ARE OFFERING THE COVID19 VACCINES IN OUR OFFICE! Ask the front desk for dates!     "Like" [place] on Facebook and Instagram for our latest updates!      A healthy democracy works best when [employer] participate! Make sure you are registered to vote! If you have moved or changed any of your contact information, you will need to get this updated before voting!  In some cases, you MAY be able to register to vote online: [email or web address]

## 2021-01-10 NOTE — Progress Notes (Signed)
FOLLOW UP  Date of Service/Encounter:  01/10/21   Assessment:   Pruritis - with sensitization to dust mite    Chronic urticaria - with incomplete resolution with the use of suppressive antihistamines   Mild eosinophilia (AEC 500)   Intermittent asthma, uncomplicated  Plan/Recommendations:   1. Moderate intermittent asthma, uncomplicated - Lung testing looks good today. - I think we are in a good spot from a breathing perspective.  - Daily controller medication(s): NOTHING - Rescue medications: albuterol 4 puffs every 4-6 hours as needed WITH COUGHING/WHEEZING - Changes during respiratory infections or worsening symptoms: Add on Advair 250/25mcg to 1 puff twice daily for TWO WEEKS TO PREVENT PREDNISONE. - Asthma control goals:  * Full participation in all desired activities (may need albuterol before activity) * Albuterol use two time or less a week on average (not counting use with activity) * Cough interfering with sleep two time or less a month * Oral steroids no more than once a year * No hospitalizations  2. Pruritus with urticaria - We are going to increase the Xolair to every 2 weeks to see if this can help.  - Maybe once the bugs are under better control, the itching will be more manageable and we can decrease back to every 4 weeks. - Try Opzelura twice daily to the lesions to see if that helps (non-steroidal cream).  - Let us know if you like it and we can send it in to a specialty pharmacy.  - Continue the hydroxyzine as needed.  - Continue with the following:  AM: Allegra 1-2 tablets + Pepcid 20mg   PM: Allegra 1-2 tablets + Pepcid 20mg   - Continue with the use of Eucerin + triamcinolone ointment ONCE daily to your entire body.  3. Return in about 3 months (around 04/11/2021) in [place].   Subjective:   [name] is a 65 y.o. female presenting today for follow up of No chief complaint on file.   [name] has a history of the  following: Patient Active Problem List   Diagnosis Date Noted   Dysuria 08/31/2020   Chronic pruritus 05/30/2020   Rash 04/03/2020   Chest pain 11/14/2019   Insomnia 11/14/2019   Pelvic prolapse 10/06/2019   Leg pain, bilateral 05/01/2019   Acute diverticulitis 12/23/2018   Asthma 10/02/2018   Pain in right hand 07/12/2018   Pain in both feet 11/08/2017   Nodule of chest wall 10/11/2017   Mass of right side of neck 10/11/2017   Varicose veins of bilateral lower extremities with other complications 43/32/9518   Complication of anesthesia    Right groin pain 04/21/2017   Hyperlipidemia LDL goal <100 03/23/2017   Dorsalgia 03/01/2017   Rapid palpitations 03/01/2017   Preop cardiovascular exam 03/01/2017   Right lumbar radiculopathy 08/15/2016   Rib pain on right side 03/03/2016   Nosebleed 03/03/2016   Acute colitis 08/30/2013   GERD (gastroesophageal reflux disease) 08/14/2012   Pain and swelling of lower leg 10/23/2011   Depression 07/10/2011   History of renal stone 04/15/2011   Pleural effusion 04/14/2011   Impaired glucose tolerance 04/14/2011   Hypokalemia 04/14/2011   Chronic low back pain 03/29/2011   Obesity 03/29/2011   Encounter for well adult exam with abnormal findings 12/30/2010   THYROID NODULE, RIGHT 02/21/2010   NECK PAIN, RIGHT 02/21/2010   Bilateral lower extremity edema 02/21/2010   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  [name] is a 65 y.o. female presenting for a follow up visit. She was last seen in June 2022. At that time, we did not do lung testing. We continued with the PRN use of the Advair during periods of respiratory distress. For her urticaria with  pruritis, we recommended increasing the Xolair to every two weeks. We felt that once the bugs were under better control, we could decrease/eliminate the Xolair completely. We continued with the use of Allegra and Pepcid BID as well as Eucerin + triamcinolone ointment once daily to the entire body. We also recommended sending her for a second opinion to [facility].   Since the visit, she has mostly done well.   Asthma/Respiratory Symptom History: She has been doing well from a breathing perspective. She has the Advair and has not really needed to use it in a long time. She has not been using her albuterol in quite some time. She has not needed to add on her Advair at all.   Skin Symptom History: She did see the Dermatologist. She was placed on clobetasol twice daily. She is on the Xolair every 4 weeks. She saw Dr. Rozann Lesches and an increase to every two weeks. She has been cutting back on her hydroxyzine. She was having some sedation from that.  She would be interested in doing home injections. She is nervous about the cost. Her husband has a nerve degrading syndrome that has been causing some issues. She needs to be able to stay at home as much as she can to help him.  Otherwise, there have been no changes to her past medical history, surgical history, family history, or social history.    Review of Systems  Constitutional: Negative.  Negative for fever, malaise/fatigue and weight loss.  HENT: Negative.  Negative for congestion, ear discharge and ear pain.   Eyes:  Negative for pain, discharge and redness.  Respiratory:  Negative for cough, sputum production, shortness of breath and wheezing.   Cardiovascular: Negative.  Negative for chest pain and palpitations.  Gastrointestinal:  Negative for abdominal pain, constipation, diarrhea, heartburn, nausea and vomiting.  Skin: Negative.  Negative for itching and rash.  Neurological:  Negative for dizziness and headaches.   Endo/Heme/Allergies:  Negative for environmental allergies. Does not bruise/bleed easily.  All other systems reviewed and are negative.     Objective:   Blood pressure 108/70, pulse 86, temperature 98.2 F (36.8 C), temperature source Temporal, resp. rate 20, weight 190 lb 6.4 oz (86.4 kg), SpO2 99 %. Body mass index is 33.73 kg/m.   Physical Exam:  Physical Exam Vitals reviewed.  Constitutional:      Appearance: She is well-developed.  HENT:     Head: Normocephalic and atraumatic.     Right Ear: Tympanic membrane, ear canal and external ear normal.     Left Ear: Tympanic membrane, ear canal and external ear normal.     Nose: No nasal deformity, septal deviation, mucosal edema or rhinorrhea.     Right Turbinates: Enlarged and swollen.     Left Turbinates: Enlarged and swollen.     Right Sinus: No maxillary sinus tenderness or frontal sinus tenderness.     Left Sinus: No maxillary sinus tenderness or frontal sinus tenderness.  Comments: No nasal polyps.    Mouth/Throat:     Mouth: Mucous membranes are not pale and not dry.     Pharynx: Uvula midline.  Eyes:     General: Lids are normal. No allergic shiner.       Right eye: No discharge.        Left eye: No discharge.     Conjunctiva/sclera: Conjunctivae normal.     Right eye: Right conjunctiva is not injected. No chemosis.    Left eye: Left conjunctiva is not injected. No chemosis.    Pupils: Pupils are equal, round, and reactive to light.  Cardiovascular:     Rate and Rhythm: Normal rate and regular rhythm.     Heart sounds: Normal heart sounds.  Pulmonary:     Effort: Pulmonary effort is normal. No tachypnea, accessory muscle usage or respiratory distress.     Breath sounds: Normal breath sounds. No wheezing, rhonchi or rales.     Comments: Moving air well in all lung fields. Chest:     Chest wall: No tenderness.  Lymphadenopathy:     Cervical: No cervical adenopathy.  Skin:    General: Skin is warm.      Capillary Refill: Capillary refill takes less than 2 seconds.     Coloration: Skin is not pale.     Findings: Lesion present. No abrasion, erythema or petechiae. Rash is not papular, urticarial or vesicular.     Comments: She has multiple lesions on her arms as well as her thighs with eschar formation.  These are approximately 1 cm in diameter, sometimes smaller.  There is no oozing.  There is some surrounding excoriations.  Neurological:     Mental Status: She is alert.  Psychiatric:        Behavior: Behavior is cooperative.     Diagnostic studies:   Spirometry: results normal (FEV1: 1.89/81%, FVC: 2.52/85%, FEV1/FVC: 75%).    Spirometry consistent with normal pattern.   Allergy Studies: none        Salvatore Marvel, MD  [facility] [place] [facility] [place]

## 2021-01-10 NOTE — Telephone Encounter (Signed)
Called patient and advised new rx sent with updated dosing and delivery to patient home. She will need to wait till next injections  shipment is due for them to ship but that is coming up next week so hopefully they can get shipped for sometime next week

## 2021-01-10 NOTE — Telephone Encounter (Signed)
See below- thanks!

## 2021-01-10 NOTE — Telephone Encounter (Signed)
Please advise as the pt has stated she took a UTI test from the drug store at home and it should she had a UTI. Pt states can she drop off a urine sample to be rx'd an abx for her sxs of frequent urination, some burning upon urination and rt sided flank pain.

## 2021-01-11 ENCOUNTER — Other Ambulatory Visit (INDEPENDENT_AMBULATORY_CARE_PROVIDER_SITE_OTHER): Payer: No Typology Code available for payment source

## 2021-01-11 DIAGNOSIS — R300 Dysuria: Secondary | ICD-10-CM

## 2021-01-11 NOTE — Telephone Encounter (Signed)
I was able to speak with pt and advise her of Dr. Gwynn Burly instructions. Pt states she understands and has no questions or concerns at this time.

## 2021-01-12 ENCOUNTER — Encounter: Payer: Self-pay | Admitting: Internal Medicine

## 2021-01-12 LAB — URINALYSIS, ROUTINE W REFLEX MICROSCOPIC
Bilirubin Urine: NEGATIVE
Hgb urine dipstick: NEGATIVE
Ketones, ur: NEGATIVE
Nitrite: NEGATIVE
Specific Gravity, Urine: 1.02 (ref 1.000–1.030)
Total Protein, Urine: NEGATIVE
Urine Glucose: NEGATIVE
Urobilinogen, UA: 0.2 — AB (ref 0.0–1.0)
pH: 6 (ref 5.0–8.0)

## 2021-01-12 LAB — URINE CULTURE

## 2021-01-13 NOTE — Telephone Encounter (Signed)
Thank you Tammy!  Salvatore Marvel, MD [facility] [place] [facility] [place]

## 2021-01-19 ENCOUNTER — Other Ambulatory Visit: Payer: Self-pay

## 2021-01-19 ENCOUNTER — Ambulatory Visit (INDEPENDENT_AMBULATORY_CARE_PROVIDER_SITE_OTHER): Payer: No Typology Code available for payment source

## 2021-01-19 DIAGNOSIS — L501 Idiopathic urticaria: Secondary | ICD-10-CM

## 2021-01-19 NOTE — Progress Notes (Signed)
Patient received 300 mg Xolair today.( 1.2 given by me and 1.2 given by patient). She will be taking Xolair 300 mg home today to give to herself every 2 weeks. Instructions given. Patient verbalized understanding and has no further questions.

## 2021-01-25 ENCOUNTER — Encounter: Payer: Self-pay | Admitting: Internal Medicine

## 2021-01-26 ENCOUNTER — Other Ambulatory Visit: Payer: Self-pay

## 2021-01-26 MED ORDER — PANTOPRAZOLE SODIUM 40 MG PO TBEC: 40.0000 mg | DELAYED_RELEASE_TABLET | Freq: Every day | ORAL | 3 refills | Status: DC

## 2021-01-26 NOTE — Progress Notes (Signed)
Medication refilled

## 2021-01-26 NOTE — Telephone Encounter (Signed)
Medication refilled to [facility].

## 2021-02-02 ENCOUNTER — Telehealth: Payer: Self-pay | Admitting: Allergy & Immunology

## 2021-02-02 NOTE — Telephone Encounter (Signed)
Pt wanted it noted that she gave herself xolair shots on 10/12, the safety needed would not close. Per Damita I told pt to discard needle as usual, pt was ok with this reply.

## 2021-03-26 ENCOUNTER — Other Ambulatory Visit: Payer: Self-pay | Admitting: Internal Medicine

## 2021-04-08 ENCOUNTER — Ambulatory Visit (INDEPENDENT_AMBULATORY_CARE_PROVIDER_SITE_OTHER): Payer: No Typology Code available for payment source | Admitting: *Deleted

## 2021-04-08 ENCOUNTER — Other Ambulatory Visit: Payer: Self-pay

## 2021-04-08 DIAGNOSIS — Z23 Encounter for immunization: Secondary | ICD-10-CM | POA: Diagnosis not present

## 2021-04-15 ENCOUNTER — Other Ambulatory Visit: Payer: Self-pay | Admitting: Internal Medicine

## 2021-04-15 MED ORDER — AZITHROMYCIN 250 MG PO TABS: ORAL_TABLET | ORAL | 1 refills | Status: DC

## 2021-04-20 ENCOUNTER — Other Ambulatory Visit: Payer: Self-pay

## 2021-04-20 MED ORDER — AZITHROMYCIN 250 MG PO TABS: ORAL_TABLET | ORAL | 1 refills | Status: AC

## 2021-04-26 ENCOUNTER — Other Ambulatory Visit: Payer: Self-pay | Admitting: Obstetrics and Gynecology

## 2021-04-26 DIAGNOSIS — Z1231 Encounter for screening mammogram for malignant neoplasm of breast: Secondary | ICD-10-CM

## 2021-06-01 ENCOUNTER — Ambulatory Visit
Admission: RE | Admit: 2021-06-01 | Discharge: 2021-06-01 | Disposition: A | Discharge status: Home / Self Care | Payer: No Typology Code available for payment source | Source: Ambulatory Visit | Attending: Obstetrics and Gynecology | Admitting: Obstetrics and Gynecology

## 2021-06-01 DIAGNOSIS — Z1231 Encounter for screening mammogram for malignant neoplasm of breast: Secondary | ICD-10-CM

## 2021-06-02 ENCOUNTER — Other Ambulatory Visit: Payer: Self-pay | Admitting: Obstetrics and Gynecology

## 2021-06-02 DIAGNOSIS — R928 Other abnormal and inconclusive findings on diagnostic imaging of breast: Secondary | ICD-10-CM

## 2021-06-06 ENCOUNTER — Encounter: Payer: Self-pay | Admitting: Internal Medicine

## 2021-06-06 ENCOUNTER — Telehealth: Payer: Self-pay

## 2021-06-06 DIAGNOSIS — E538 Deficiency of other specified B group vitamins: Secondary | ICD-10-CM

## 2021-06-06 DIAGNOSIS — R7302 Impaired glucose tolerance (oral): Secondary | ICD-10-CM

## 2021-06-06 DIAGNOSIS — E785 Hyperlipidemia, unspecified: Secondary | ICD-10-CM

## 2021-06-06 DIAGNOSIS — E559 Vitamin D deficiency, unspecified: Secondary | ICD-10-CM

## 2021-06-06 NOTE — Telephone Encounter (Signed)
Ok labs ordered 

## 2021-06-06 NOTE — Telephone Encounter (Signed)
Pt is requesting lab work to be done before her appt for her physical incase there is some changes.   Please advise pt [phone]

## 2021-06-07 ENCOUNTER — Ambulatory Visit: Payer: No Typology Code available for payment source

## 2021-06-07 ENCOUNTER — Ambulatory Visit
Admission: RE | Admit: 2021-06-07 | Discharge: 2021-06-07 | Disposition: A | Discharge status: Home / Self Care | Payer: No Typology Code available for payment source | Source: Ambulatory Visit | Attending: Obstetrics and Gynecology | Admitting: Obstetrics and Gynecology

## 2021-06-07 ENCOUNTER — Other Ambulatory Visit (INDEPENDENT_AMBULATORY_CARE_PROVIDER_SITE_OTHER): Payer: No Typology Code available for payment source

## 2021-06-07 DIAGNOSIS — E785 Hyperlipidemia, unspecified: Secondary | ICD-10-CM | POA: Diagnosis not present

## 2021-06-07 DIAGNOSIS — E538 Deficiency of other specified B group vitamins: Secondary | ICD-10-CM

## 2021-06-07 DIAGNOSIS — E559 Vitamin D deficiency, unspecified: Secondary | ICD-10-CM | POA: Diagnosis not present

## 2021-06-07 DIAGNOSIS — R7302 Impaired glucose tolerance (oral): Secondary | ICD-10-CM

## 2021-06-07 DIAGNOSIS — R928 Other abnormal and inconclusive findings on diagnostic imaging of breast: Secondary | ICD-10-CM

## 2021-06-07 LAB — CBC WITH DIFFERENTIAL/PLATELET
Basophils Absolute: 0.1 10*3/uL (ref 0.0–0.1)
Basophils Relative: 1.1 % (ref 0.0–3.0)
Eosinophils Absolute: 0.5 10*3/uL (ref 0.0–0.7)
Eosinophils Relative: 8 % — ABNORMAL HIGH (ref 0.0–5.0)
HCT: 39.9 % (ref 36.0–46.0)
Hemoglobin: 13.3 g/dL (ref 12.0–15.0)
Lymphocytes Relative: 28.9 % (ref 12.0–46.0)
Lymphs Abs: 2 10*3/uL (ref 0.7–4.0)
MCHC: 33.4 g/dL (ref 30.0–36.0)
MCV: 93.3 fl (ref 78.0–100.0)
Monocytes Absolute: 0.6 10*3/uL (ref 0.1–1.0)
Monocytes Relative: 8.6 % (ref 3.0–12.0)
Neutro Abs: 3.6 10*3/uL (ref 1.4–7.7)
Neutrophils Relative %: 53.4 % (ref 43.0–77.0)
Platelets: 210 10*3/uL (ref 150.0–400.0)
RBC: 4.27 Mil/uL (ref 3.87–5.11)
RDW: 13.6 % (ref 11.5–15.5)
WBC: 6.8 10*3/uL (ref 4.0–10.5)

## 2021-06-07 LAB — VITAMIN D 25 HYDROXY (VIT D DEFICIENCY, FRACTURES): VITD: 27.21 ng/mL — ABNORMAL LOW (ref 30.00–100.00)

## 2021-06-07 LAB — HEMOGLOBIN A1C: Hgb A1c MFr Bld: 5.6 % (ref 4.6–6.5)

## 2021-06-07 LAB — URINALYSIS, ROUTINE W REFLEX MICROSCOPIC
Bilirubin Urine: NEGATIVE
Hgb urine dipstick: NEGATIVE
Ketones, ur: NEGATIVE
Nitrite: NEGATIVE
RBC / HPF: NONE SEEN (ref 0–?)
Specific Gravity, Urine: 1.02 (ref 1.000–1.030)
Total Protein, Urine: NEGATIVE
Urine Glucose: NEGATIVE
Urobilinogen, UA: 0.2 (ref 0.0–1.0)
pH: 6 (ref 5.0–8.0)

## 2021-06-07 LAB — TSH: TSH: 13.25 u[IU]/mL — ABNORMAL HIGH (ref 0.35–5.50)

## 2021-06-07 LAB — VITAMIN B12: Vitamin B-12: 460 pg/mL (ref 211–911)

## 2021-06-07 NOTE — Telephone Encounter (Signed)
Notified patient via phone that lab orders have been placed

## 2021-06-07 NOTE — Telephone Encounter (Signed)
Patient notified

## 2021-06-08 LAB — LIPID PANEL
Cholesterol: 164 mg/dL (ref 0–200)
HDL: 55.5 mg/dL (ref >39.00)
LDL Cholesterol: 90 mg/dL (ref 0–99)
NonHDL: 108.33
Total CHOL/HDL Ratio: 3
Triglycerides: 94 mg/dL (ref 0.0–149.0)
VLDL: 18.8 mg/dL (ref 0.0–40.0)

## 2021-06-08 LAB — HEPATIC FUNCTION PANEL
ALT: 15 U/L (ref 0–35)
AST: 18 U/L (ref 0–37)
Albumin: 4 g/dL (ref 3.5–5.2)
Alkaline Phosphatase: 75 U/L (ref 39–117)
Bilirubin, Direct: 0 mg/dL (ref 0.0–0.3)
Total Bilirubin: 0.3 mg/dL (ref 0.2–1.2)
Total Protein: 6.9 g/dL (ref 6.0–8.3)

## 2021-06-08 LAB — BASIC METABOLIC PANEL
BUN: 19 mg/dL (ref 6–23)
CO2: 25 mEq/L (ref 19–32)
Calcium: 9.4 mg/dL (ref 8.4–10.5)
Chloride: 102 mEq/L (ref 96–112)
Creatinine, Ser: 0.95 mg/dL (ref 0.40–1.20)
GFR: 63.71 mL/min (ref >60.00)
Glucose, Bld: 79 mg/dL (ref 70–99)
Potassium: 3.7 mEq/L (ref 3.5–5.1)
Sodium: 138 mEq/L (ref 135–145)

## 2021-06-10 ENCOUNTER — Ambulatory Visit (INDEPENDENT_AMBULATORY_CARE_PROVIDER_SITE_OTHER): Payer: No Typology Code available for payment source

## 2021-06-10 ENCOUNTER — Other Ambulatory Visit: Payer: Self-pay

## 2021-06-10 ENCOUNTER — Ambulatory Visit: Payer: No Typology Code available for payment source | Admitting: Sports Medicine

## 2021-06-10 ENCOUNTER — Encounter: Payer: Self-pay | Admitting: Internal Medicine

## 2021-06-10 ENCOUNTER — Ambulatory Visit (INDEPENDENT_AMBULATORY_CARE_PROVIDER_SITE_OTHER): Payer: No Typology Code available for payment source | Admitting: Internal Medicine

## 2021-06-10 VITALS — BP 112/60 | HR 78 | Ht 63.0 in

## 2021-06-10 VITALS — BP 112/60 | HR 78 | Temp 97.9°F | Ht 63.0 in | Wt 190.4 lb

## 2021-06-10 DIAGNOSIS — Z0001 Encounter for general adult medical examination with abnormal findings: Secondary | ICD-10-CM | POA: Diagnosis not present

## 2021-06-10 DIAGNOSIS — E559 Vitamin D deficiency, unspecified: Secondary | ICD-10-CM | POA: Diagnosis not present

## 2021-06-10 DIAGNOSIS — M79642 Pain in left hand: Secondary | ICD-10-CM | POA: Diagnosis not present

## 2021-06-10 DIAGNOSIS — R7302 Impaired glucose tolerance (oral): Secondary | ICD-10-CM | POA: Diagnosis not present

## 2021-06-10 DIAGNOSIS — M79641 Pain in right hand: Secondary | ICD-10-CM

## 2021-06-10 DIAGNOSIS — E785 Hyperlipidemia, unspecified: Secondary | ICD-10-CM | POA: Diagnosis not present

## 2021-06-10 DIAGNOSIS — F411 Generalized anxiety disorder: Secondary | ICD-10-CM

## 2021-06-10 DIAGNOSIS — E039 Hypothyroidism, unspecified: Secondary | ICD-10-CM | POA: Diagnosis not present

## 2021-06-10 MED ORDER — CHOLECALCIFEROL 50 MCG (2000 UT) PO TABS: ORAL_TABLET | ORAL | 99 refills | Status: AC

## 2021-06-10 MED ORDER — LEVOTHYROXINE SODIUM 125 MCG PO TABS: 125.0000 ug | ORAL_TABLET | Freq: Every day | ORAL | 3 refills | Status: DC

## 2021-06-10 NOTE — Patient Instructions (Addendum)
Good to see you  Tylenol 500 mg 2-3 times a day for pain relief  Ibuprofen as needed for breakthrough pain  Recommend warm hand baths or epsom salt baths for hands Future labs rheumatoid factor , ANA  4 week follow up

## 2021-06-10 NOTE — Progress Notes (Signed)
Benito Mccreedy D.[place] [facility] [facility] Phone: [phone]   Assessment and Plan:     1. Right hand pain 2. Left hand pain -Chronic with exacerbation, initial sports medicine visit - Suspect patient's chronic pain with acute worsening is likely failure of osteoarthritis primarily localized to the second MCP, however entirety of bilateral hands will ache for patient - Due to several months of pain and patient having history of autoimmune disease with thyroid disease, we will rule out rheumatoid arthritis with lab work to be drawn at a future visit - X-rays obtained in clinic.  My interpretation: No acute fracture or dislocation.  Mild cortical changes most prominent at [employer] joint bilaterally - DG Hand Complete Left; Future - Rheumatoid factor; Future - ANA; Future    Pertinent previous records reviewed include none   Follow Up: 4 weeks for reevaluation.  We will review lab work and discuss how conservative treatment has helped   Subjective:   I, Pincus Badder, am serving as a Education administrator for Doctor Glennon Mac  Chief Complaint: bilateral hand pain   HPI:   06/10/21 Patient is a 66 year old female complaining of bilateral hand pain. Patient states hands have been hurting for several months has had an injection, knuckles fell jammed up, stiff and stuck, she notes there is swelling as well. Pain is a constant aching has tried IB, massage and heat the pain started being constant right before christmas   Relevant Historical Information: Hypothyroidism  Additional pertinent review of systems negative.   Current Outpatient Medications:    Anhydrous Base (PCCA ELLAGE VAGINAL) CREA, 0.02 Tubes by Does not apply route 2 (two) times a week. 2 DAYS A WEEK, Disp: , Rfl:    Cholecalciferol 50 MCG (2000 UT) TABS, 1 tab by mouth once daily, Disp: 30 tablet, Rfl: 99   clobetasol cream (TEMOVATE) 0.05 %, Apply topically., Disp: ,  Rfl:    EPINEPHrine (EPIPEN 2-PAK) 0.3 mg/0.3 mL IJ SOAJ injection, Inject 0.3 mg into the muscle as needed for anaphylaxis., Disp: 2 each, Rfl: 1   Estradiol 1 MG/GM GEL, Place onto the skin. Take (1 ml) (0.2mg /ml )cream  twice a week on Monday and Friday, Disp: , Rfl:    famotidine (PEPCID) 20 MG tablet, Take 20 mg by mouth 2 (two) times daily., Disp: , Rfl:    fluticasone-salmeterol (ADVAIR DISKUS) 250-50 MCG/ACT AEPB, Inhale 1 puff into the lungs 2 (two) times daily., Disp: 60 each, Rfl: 5   Fluticasone-Salmeterol (ADVAIR DISKUS) 250-50 MCG/DOSE AEPB, Inhale 1 puff into the lungs 2 (two) times daily., Disp: 1 each, Rfl: 3   furosemide (LASIX) 40 MG tablet, Take 1 tablet (40 mg total) by mouth daily as needed for fluid or edema., Disp: 30 tablet, Rfl: 2   hydrOXYzine (ATARAX/VISTARIL) 10 MG tablet, Take 1 tablet (10 mg total) by mouth 3 (three) times daily as needed., Disp: 270 tablet, Rfl: 1   ibuprofen (ADVIL) 800 MG tablet, Take 1 tablet (800 mg total) by mouth every 8 (eight) hours as needed for moderate pain., Disp: 60 tablet, Rfl: 0   levothyroxine (SYNTHROID) 125 MCG tablet, Take 1 tablet (125 mcg total) by mouth daily., Disp: 90 tablet, Rfl: 3   loratadine (CLARITIN) 10 MG tablet, Take 10 mg by mouth daily., Disp: , Rfl:    omalizumab (XOLAIR) 150 MG injection, Inject 300 mg into the skin every 14 (fourteen) days., Disp: 4 each, Rfl: 11   pantoprazole (  PROTONIX) 40 MG tablet, Take 1 tablet (40 mg total) by mouth daily., Disp: 90 tablet, Rfl: 3   polyethylene glycol (MIRALAX / GLYCOLAX) 17 g packet, Take 17 g by mouth daily. (Patient taking differently: Take 17 g by mouth as needed.), Disp: 14 each, Rfl: 0   triamcinolone 0.1% oint-Eucerin equivalent cream 1:1 mixture, Apply topically 2 (two) times daily as needed., Disp: 300 g, Rfl: 3   zolpidem (AMBIEN) 10 MG tablet, TAKE 1 TABLET AT BEDTIME ASNEEDED FOR SLEEP, Disp: 90 tablet, Rfl: 1   albuterol (VENTOLIN HFA) 108 (90 Base) MCG/ACT  inhaler, INHALE 2 PUFFS BY MOUTH INTO THE LUNGS EVERY 6 HOURS AS NEEDED FOR WHEEZING OR SHORTNESS OF BREATH (Patient taking differently: Inhale 2 puffs into the lungs See admin instructions. Pt has been using Ventolin as her daily inhaler instead of the Advair. Original sig is 2 puffs q6hprn pt has been taking as 2 puffs bid.), Disp: 18 g, Rfl: 3   rosuvastatin (CRESTOR) 10 MG tablet, TAKE 1 TABLET BY MOUTH ONCE DAILY, Disp: 90 tablet, Rfl: 3  Current Facility-Administered Medications:    omalizumab Arvid Right) injection 300 mg, 300 mg, Subcutaneous, Q28 days, Valentina Shaggy, MD, 300 mg at 01/19/21 0928   Objective:     Vitals:   06/10/21 1004  BP: 112/60  Pulse: 78  SpO2: 98%  Height: 5\' 3"  (1.6 m)      Body mass index is 33.72 kg/m.    Physical Exam:    General: Appears well, nad, nontoxic and pleasant Neuro:sensation intact, strength is 5/5 with df/pf/inv/ev, muscle tone wnl Skin:no susupicious lesions or rashes  Bilateral wrist:  No deformity or swelling appreciated. ROM  Ext 90, flexion70, radial/ulnar deviation 30 TTP second MCP nttp over the snauff box, dorsal carpals, volar carpals, radial styloid, ulnar styloid, 1st mcp, tfcc Negative Tinel's Negative finklestein Neg tfcc bounce test No pain with resisted ext, flex or deviation   Electronically signed by:  Benito Mccreedy D.[facility] 12:15 PM 06/10/21

## 2021-06-10 NOTE — Patient Instructions (Signed)
Please take OTC Vitamin D3 at 2000 units per day, indefinitely  Ok to increase the thyroid medication to 125 mcg per day  Please plan to go to the [facility] in 4 wks for follow up thyroid testing  We have discussed the Cardiac CT Score test to measure the calcification level (if any) in your heart arteries.  This test has been ordered in our [place], so please call [facility] directly, as they prefer this, at [phone] to be scheduled.  Please continue all other medications as before, and refills have been done if requested.  Please have the pharmacy call with any other refills you may need.  Please continue your efforts at being more active, low cholesterol diet, and weight control.  You are otherwise up to date with prevention measures today.  Please keep your appointments with your specialists as you may have planned  Please make an Appointment to return in 6 months, or sooner if needed
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
## 2021-06-11 DIAGNOSIS — E559 Vitamin D deficiency, unspecified: Secondary | ICD-10-CM | POA: Insufficient documentation

## 2021-06-11 NOTE — Assessment & Plan Note (Signed)
Chronic stable, declines need for change in tx at this time

## 2021-06-11 NOTE — Assessment & Plan Note (Signed)
Lab Results  Component Value Date   LDLCALC 90 06/07/2021   Stable at goal for now, pt to continue current diet, but also for card Ct Score, and goal ldl < 70 if abnormal

## 2021-06-11 NOTE — Assessment & Plan Note (Signed)
Age and sex appropriate education and counseling updated with regular exercise and diet Referrals for preventative services - none needed Immunizations addressed - declines covid booster, shingrix Smoking counseling  - none needed Evidence for depression or other mood disorder - chronic anxiety stable Most recent labs reviewed. I have personally reviewed and have noted: 1) the patient's medical and social history 2) The patient's current medications and supplements 3) The patient's height, weight, and BMI have been recorded in the chart

## 2021-06-11 NOTE — Assessment & Plan Note (Signed)
Lab Results  Component Value Date   HGBA1C 5.6 06/07/2021   Stable, pt to continue current medical treatment - diet

## 2021-06-11 NOTE — Assessment & Plan Note (Signed)
Lab Results  Component Value Date   TSH 13.25 (H) 06/07/2021   Uncontrolled, for increased levothyroxine to 125 and f/u TFTs in 4 wks

## 2021-06-11 NOTE — Assessment & Plan Note (Signed)
Last vitamin D Lab Results  Component Value Date   VD25OH 27.21 (L) 06/07/2021   Low, to start oral replacement

## 2021-06-24 ENCOUNTER — Other Ambulatory Visit: Payer: No Typology Code available for payment source

## 2021-07-01 ENCOUNTER — Other Ambulatory Visit: Payer: Self-pay

## 2021-07-01 ENCOUNTER — Other Ambulatory Visit: Payer: Self-pay | Admitting: Internal Medicine

## 2021-07-01 ENCOUNTER — Telehealth: Payer: Self-pay

## 2021-07-01 ENCOUNTER — Ambulatory Visit (INDEPENDENT_AMBULATORY_CARE_PROVIDER_SITE_OTHER)
Admission: RE | Admit: 2021-07-01 | Discharge: 2021-07-01 | Disposition: A | Discharge status: Home / Self Care | Payer: Self-pay | Source: Ambulatory Visit | Attending: Internal Medicine | Admitting: Internal Medicine

## 2021-07-01 DIAGNOSIS — I2510 Atherosclerotic heart disease of native coronary artery without angina pectoris: Secondary | ICD-10-CM

## 2021-07-01 DIAGNOSIS — R7302 Impaired glucose tolerance (oral): Secondary | ICD-10-CM

## 2021-07-01 DIAGNOSIS — R931 Abnormal findings on diagnostic imaging of heart and coronary circulation: Secondary | ICD-10-CM

## 2021-07-01 DIAGNOSIS — E785 Hyperlipidemia, unspecified: Secondary | ICD-10-CM

## 2021-07-01 MED ORDER — ASPIRIN 81 MG PO TBEC: 81.0000 mg | DELAYED_RELEASE_TABLET | Freq: Every day | ORAL | 12 refills | Status: DC

## 2021-07-01 MED ORDER — ROSUVASTATIN CALCIUM 20 MG PO TABS: 20.0000 mg | ORAL_TABLET | Freq: Every day | ORAL | 3 refills | Status: DC

## 2021-07-01 NOTE — Telephone Encounter (Signed)
Oh no sorry ? ?There is is equivalent substitute ? ?So we simply have to not take the aspirin, thanks ?

## 2021-07-01 NOTE — Telephone Encounter (Signed)
Pt has called and stated she has seen Dr. Gwynn Burly note about her CT Score test. Pt states she will be going to pick up her rx for the Crestor. However, she is allergic to asprinas it is on her allergy list as well. Pt states can she take Ibuprofen or something in place of the aspirin? ? ? ?**Please contact the pt with instructions on what to take in place of the Asprin. Thanks. ?

## 2021-07-01 NOTE — Telephone Encounter (Signed)
Patient notified and verbalizes understanding.

## 2021-07-07 ENCOUNTER — Other Ambulatory Visit (INDEPENDENT_AMBULATORY_CARE_PROVIDER_SITE_OTHER): Payer: No Typology Code available for payment source

## 2021-07-07 ENCOUNTER — Encounter: Payer: Self-pay | Admitting: Internal Medicine

## 2021-07-07 DIAGNOSIS — E039 Hypothyroidism, unspecified: Secondary | ICD-10-CM | POA: Diagnosis not present

## 2021-07-07 LAB — T4, FREE: Free T4: 1.79 ng/dL — ABNORMAL HIGH (ref 0.60–1.60)

## 2021-07-07 LAB — TSH: TSH: 0.32 u[IU]/mL — ABNORMAL LOW (ref 0.35–5.50)

## 2021-07-08 ENCOUNTER — Ambulatory Visit: Payer: No Typology Code available for payment source | Admitting: Sports Medicine
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
## 2021-07-23 ENCOUNTER — Encounter (HOSPITAL_COMMUNITY): Payer: Self-pay | Admitting: Emergency Medicine

## 2021-07-23 ENCOUNTER — Emergency Department (HOSPITAL_COMMUNITY)
Admission: EM | Admit: 2021-07-23 | Discharge: 2021-07-23 | Disposition: A | Discharge status: Home / Self Care | Payer: No Typology Code available for payment source | Attending: Emergency Medicine | Admitting: Emergency Medicine

## 2021-07-23 ENCOUNTER — Other Ambulatory Visit: Payer: Self-pay

## 2021-07-23 ENCOUNTER — Emergency Department (HOSPITAL_COMMUNITY): Payer: No Typology Code available for payment source

## 2021-07-23 DIAGNOSIS — R110 Nausea: Secondary | ICD-10-CM | POA: Insufficient documentation

## 2021-07-23 DIAGNOSIS — Z91040 Latex allergy status: Secondary | ICD-10-CM | POA: Diagnosis not present

## 2021-07-23 DIAGNOSIS — R0789 Other chest pain: Secondary | ICD-10-CM | POA: Insufficient documentation

## 2021-07-23 DIAGNOSIS — R0602 Shortness of breath: Secondary | ICD-10-CM | POA: Insufficient documentation

## 2021-07-23 DIAGNOSIS — Z20822 Contact with and (suspected) exposure to covid-19: Secondary | ICD-10-CM | POA: Insufficient documentation

## 2021-07-23 DIAGNOSIS — M79601 Pain in right arm: Secondary | ICD-10-CM | POA: Insufficient documentation

## 2021-07-23 HISTORY — PX: OTHER SURGICAL HISTORY: SHX169

## 2021-07-23 LAB — BASIC METABOLIC PANEL
Anion gap: 7 (ref 5–15)
BUN: 15 mg/dL (ref 8–23)
CO2: 23 mmol/L (ref 22–32)
Calcium: 9.2 mg/dL (ref 8.9–10.3)
Chloride: 107 mmol/L (ref 98–111)
Creatinine, Ser: 0.74 mg/dL (ref 0.44–1.00)
GFR, Estimated: >60 mL/min (ref >60)
Glucose, Bld: 100 mg/dL — ABNORMAL HIGH (ref 70–99)
Potassium: 3.8 mmol/L (ref 3.5–5.1)
Sodium: 137 mmol/L (ref 135–145)

## 2021-07-23 LAB — URINALYSIS, ROUTINE W REFLEX MICROSCOPIC
Bilirubin Urine: NEGATIVE
Glucose, UA: NEGATIVE mg/dL
Hgb urine dipstick: NEGATIVE
Ketones, ur: NEGATIVE mg/dL
Leukocytes,Ua: NEGATIVE
Nitrite: NEGATIVE
Protein, ur: NEGATIVE mg/dL
Specific Gravity, Urine: 1.008 (ref 1.005–1.030)
pH: 6 (ref 5.0–8.0)

## 2021-07-23 LAB — CBC
HCT: 41.6 % (ref 36.0–46.0)
Hemoglobin: 14.3 g/dL (ref 12.0–15.0)
MCH: 32.1 pg (ref 26.0–34.0)
MCHC: 34.4 g/dL (ref 30.0–36.0)
MCV: 93.3 fL (ref 80.0–100.0)
Platelets: 192 10*3/uL (ref 150–400)
RBC: 4.46 MIL/uL (ref 3.87–5.11)
RDW: 12.7 % (ref 11.5–15.5)
WBC: 6.7 10*3/uL (ref 4.0–10.5)
nRBC: 0 % (ref 0.0–0.2)

## 2021-07-23 LAB — HEPATIC FUNCTION PANEL
ALT: 20 U/L (ref 0–44)
AST: 19 U/L (ref 15–41)
Albumin: 3.9 g/dL (ref 3.5–5.0)
Alkaline Phosphatase: 66 U/L (ref 38–126)
Bilirubin, Direct: 0.1 mg/dL (ref 0.0–0.2)
Indirect Bilirubin: 0.2 mg/dL — ABNORMAL LOW (ref 0.3–0.9)
Total Bilirubin: 0.3 mg/dL (ref 0.3–1.2)
Total Protein: 7.2 g/dL (ref 6.5–8.1)

## 2021-07-23 LAB — TROPONIN I (HIGH SENSITIVITY)
Troponin I (High Sensitivity): 2 ng/L (ref <18)
Troponin I (High Sensitivity): 3 ng/L (ref <18)

## 2021-07-23 LAB — RESP PANEL BY RT-PCR (FLU A&B, COVID) ARPGX2
Influenza A by PCR: NEGATIVE
Influenza B by PCR: NEGATIVE
SARS Coronavirus 2 by RT PCR: NEGATIVE

## 2021-07-23 LAB — LIPASE, BLOOD: Lipase: 44 U/L (ref 11–51)

## 2021-07-23 LAB — BRAIN NATRIURETIC PEPTIDE: B Natriuretic Peptide: 27.8 pg/mL (ref 0.0–100.0)

## 2021-07-23 MED ORDER — METHOCARBAMOL 500 MG PO TABS: 500.0000 mg | ORAL_TABLET | Freq: Four times a day (QID) | ORAL | 0 refills | Status: DC | PRN

## 2021-07-23 MED ORDER — METHOCARBAMOL 500 MG PO TABS
750.0000 mg | ORAL_TABLET | Freq: Once | ORAL | Status: AC
  Administered 2021-07-23: 750 mg via ORAL
  Filled 2021-07-23: qty 2

## 2021-07-23 MED ORDER — IOHEXOL 350 MG/ML SOLN
80.0000 mL | Freq: Once | INTRAVENOUS | Status: AC | PRN
  Administered 2021-07-23: 80 mL via INTRAVENOUS

## 2021-07-23 MED ORDER — HYDROCODONE-ACETAMINOPHEN 5-325 MG PO TABS: 1.0000 | ORAL_TABLET | Freq: Four times a day (QID) | ORAL | 0 refills | Status: DC | PRN

## 2021-07-23 MED ORDER — LIDOCAINE VISCOUS HCL 2 % MT SOLN
15.0000 mL | Freq: Once | OROMUCOSAL | Status: AC
Start: 2021-07-23 — End: 2021-07-23
  Administered 2021-07-23: 15 mL via ORAL
  Filled 2021-07-23: qty 15

## 2021-07-23 MED ORDER — PANTOPRAZOLE SODIUM 40 MG IV SOLR
40.0000 mg | Freq: Once | INTRAVENOUS | Status: AC
  Administered 2021-07-23: 40 mg via INTRAVENOUS
  Filled 2021-07-23: qty 10

## 2021-07-23 MED ORDER — MORPHINE SULFATE (PF) 4 MG/ML IV SOLN
4.0000 mg | Freq: Once | INTRAVENOUS | Status: AC
  Administered 2021-07-23: 4 mg via INTRAVENOUS
  Filled 2021-07-23: qty 1

## 2021-07-23 MED ORDER — SODIUM CHLORIDE 0.9 % IV SOLN: Freq: Once | INTRAVENOUS | Status: AC

## 2021-07-23 MED ORDER — ACETAMINOPHEN 500 MG PO TABS
1000.0000 mg | ORAL_TABLET | Freq: Once | ORAL | Status: AC
Start: 2021-07-23 — End: 2021-07-23
  Administered 2021-07-23: 1000 mg via ORAL
  Filled 2021-07-23: qty 2

## 2021-07-23 MED ORDER — ALUM & MAG HYDROXIDE-SIMETH 200-200-20 MG/5ML PO SUSP
30.0000 mL | Freq: Once | ORAL | Status: AC
Start: 2021-07-23 — End: 2021-07-23
  Administered 2021-07-23: 30 mL via ORAL
  Filled 2021-07-23: qty 30

## 2021-07-23 MED ORDER — ONDANSETRON HCL 4 MG/2ML IJ SOLN
4.0000 mg | Freq: Once | INTRAMUSCULAR | Status: AC
  Administered 2021-07-23: 4 mg via INTRAVENOUS
  Filled 2021-07-23: qty 2

## 2021-07-23 NOTE — ED Provider Triage Note (Signed)
Emergency Medicine Provider Triage Evaluation Note ? ?[name] , a 66 y.o. female  was evaluated in triage.  Pt complains of RUQ pain, centralized chest pain. Patient states pain began an hour ago while at [employer]. Patient repots she took a bite of a free sample when pain began. Patient has history of cholecystectomy. Patient also has history of cardiac disease and tells provider that "her scores are up". She is unsure what scores she is referring to. ? ?Review of Systems  ?Positive: CP, SOB, Nausea, abdominal pain, back pain ?Negative: Vomiting, fevers, diarrhea ? ?Physical Exam  ?BP (!) 148/89 (BP Location: Left Arm)   Pulse 85   Temp (!) 97.5 ?F (36.4 ?C) (Oral)   Resp (!) 22   Ht '5\' 3"'$  (1.6 m)   Wt 86.4 kg   SpO2 100%   BMI 33.74 kg/m?  ?Gen:   Awake, no distress   ?Resp:  Normal effort  ?MSK:   Moves extremities without difficulty  ?Other:  RUQ pain to palpation. 1+ edema bilaterally to LE. ? ?Medical Decision Making  ?Medically screening exam initiated at 12:33 PM.  Appropriate orders placed.  COHEN DOLEMAN was informed that the remainder of the evaluation will be completed by another provider, this initial triage assessment does not replace that evaluation, and the importance of remaining in the ED until their evaluation is complete. ? ? ?  ?Azucena Cecil, PA-C ?07/23/21 1234 ? ?

## 2021-07-23 NOTE — ED Triage Notes (Signed)
Reports chest pain in her R epigastric area, central chest that radiates to her mid-back. Reports nausea. Started around 11:20 or so at [facility].  ?

## 2021-07-23 NOTE — ED Provider Notes (Signed)
?[facility] ?Provider Note ? ? ?CSN: [id number] ?Arrival date & time: 07/23/21  1213 ? ?  ? ?History ? ?Chief Complaint  ?Patient presents with  ? Chest Pain  ? ? ?[name] is a 66 y.o. female. ? ?HPI ?Patient reports she was shopping at [employer].  She unexpectedly felt nauseated and pain in her right chest.  She thought maybe she needed something to eat so she was going to try a sample but ended up not actually eating it.  The pain continued and then she felt a uncomfortable heaviness in her right arm.  At that time she determined she should get evaluation.  She reports she continues to have a squeezing or pressure type sensation in the chest.  She reports initially it was more of a sharp pain.  She feels somewhat short of breath with that.  No syncopal episode.  Patient reports that she did recently have a coronary CT scan and is supposed to be following up with cardiology next week. ?HPI: A 66 year old patient with a history of hypercholesterolemia and obesity presents for evaluation of chest pain. Initial onset of pain was approximately 1-3 hours ago. The patient's chest pain is described as heaviness/pressure/tightness, is sharp and is not worse with exertion. The patient complains of nausea. The patient's chest pain is not middle- or left-sided, is not well-localized and does radiate to the arms/jaw/neck. The patient denies diaphoresis. The patient has no history of stroke, has no history of peripheral artery disease, has not smoked in the past 90 days, denies any history of treated diabetes, has no relevant family history of coronary artery disease (first degree relative at less than age 33) and is not hypertensive.  ? ?Home Medications ?Prior to Admission medications   ?Medication Sig Start Date End Date Taking? Authorizing Provider  ?HYDROcodone-acetaminophen (NORCO/VICODIN) 5-325 MG tablet Take 1-2 tablets by mouth every 6 (six) hours as needed for moderate pain  or severe pain. 07/23/21  Yes Charlesetta Shanks, MD  ?methocarbamol (ROBAXIN) 500 MG tablet Take 1 tablet (500 mg total) by mouth every 6 (six) hours as needed for muscle spasms. 07/23/21  Yes [place] Phifer, Jeannie Done, MD  ?albuterol (VENTOLIN HFA) 108 (90 Base) MCG/ACT inhaler INHALE 2 PUFFS BY MOUTH INTO THE LUNGS EVERY 6 HOURS AS NEEDED FOR WHEEZING OR SHORTNESS OF BREATH ?Patient taking differently: Inhale 2 puffs into the lungs See admin instructions. Pt has been using Ventolin as her daily inhaler instead of the Advair. Original sig is 2 puffs q6hprn pt has been taking as 2 puffs bid. 07/24/19 10/14/20  Biagio Borg, MD  ?Anhydrous Base [facility] VAGINAL) CREA 0.02 Tubes by Does not apply route 2 (two) times a week. 2 DAYS A WEEK    [provider]  ?Cholecalciferol 50 MCG (2000 UT) TABS 1 tab by mouth once daily 06/10/21   Biagio Borg, MD  ?clobetasol cream (TEMOVATE) 0.05 % Apply topically. 12/27/20   [provider]  ?EPINEPHrine (EPIPEN 2-PAK) 0.3 mg/0.3 mL IJ SOAJ injection Inject 0.3 mg into the muscle as needed for anaphylaxis. 07/08/20   Valentina Shaggy, MD  ?Estradiol 1 MG/GM GEL Place onto the skin. Take (1 ml) (0.'2mg'$ /ml )cream  twice a week on Monday and Friday    [provider]  ?famotidine (PEPCID) 20 MG tablet Take 20 mg by mouth 2 (two) times daily.    [provider]  ?fluticasone-salmeterol (ADVAIR DISKUS) 250-50 MCG/ACT AEPB Inhale 1 puff into the lungs  2 (two) times daily. 10/15/20   Valentina Shaggy, MD  ?Fluticasone-Salmeterol (ADVAIR DISKUS) 250-50 MCG/DOSE AEPB Inhale 1 puff into the lungs 2 (two) times daily. 10/01/18   Biagio Borg, MD  ?furosemide (LASIX) 40 MG tablet Take 1 tablet (40 mg total) by mouth daily as needed for fluid or edema. 07/26/20   Biagio Borg, MD  ?hydrOXYzine (ATARAX/VISTARIL) 10 MG tablet Take 1 tablet (10 mg total) by mouth 3 (three) times daily as needed. 08/10/20   Biagio Borg, MD  ?ibuprofen (ADVIL) 800 MG tablet Take 1 tablet  (800 mg total) by mouth every 8 (eight) hours as needed for moderate pain. 10/08/19   Everlene Farrier, MD  ?levothyroxine (SYNTHROID) 125 MCG tablet Take 1 tablet (125 mcg total) by mouth daily. 06/10/21   Biagio Borg, MD  ?loratadine (CLARITIN) 10 MG tablet Take 10 mg by mouth daily.    [provider]  ?omalizumab Arvid Right) 150 MG injection Inject 300 mg into the skin every 14 (fourteen) days. 01/10/21   Valentina Shaggy, MD  ?pantoprazole (PROTONIX) 40 MG tablet Take 1 tablet (40 mg total) by mouth daily. 01/26/21   Biagio Borg, MD  ?polyethylene glycol (MIRALAX / GLYCOLAX) 17 g packet Take 17 g by mouth daily. ?Patient taking differently: Take 17 g by mouth as needed. 10/09/19   Everlene Farrier, MD  ?rosuvastatin (CRESTOR) 20 MG tablet Take 1 tablet (20 mg total) by mouth daily. 07/01/21   Biagio Borg, MD  ?triamcinolone 0.1% oint-Eucerin equivalent cream 1:1 mixture Apply topically 2 (two) times daily as needed. 06/07/20   Valentina Shaggy, MD  ?zolpidem (AMBIEN) 10 MG tablet TAKE 1 TABLET AT BEDTIME ASNEEDED FOR SLEEP 03/27/21   Biagio Borg, MD  ?   ? ?Allergies    ?Amoxicillin, Aspirin, Latex, Sulfa antibiotics, and Avelox [moxifloxacin hcl in nacl]   ? ?Review of Systems   ?Review of Systems ?10 systems reviewed negative except as per HPI ?Physical Exam ?Updated Vital Signs ?BP 109/64   Pulse 87   Temp (!) 97.5 ?F (36.4 ?C) (Oral)   Resp 19   Ht '5\' 3"'$  (1.6 m)   Wt 86.4 kg   SpO2 99%   BMI 33.74 kg/m?  ?Physical Exam ?Constitutional:   ?   Comments: Alert nontoxic.  Patient is uncomfortable in appearance.  Mental status clear.  No respiratory distress.  color good, no pallor  ?HENT:  ?   Mouth/Throat:  ?   Mouth: Mucous membranes are moist.  ?   Pharynx: Oropharynx is clear.  ?Eyes:  ?   Extraocular Movements: Extraocular movements intact.  ?Cardiovascular:  ?   Rate and Rhythm: Normal rate and regular rhythm.  ?Pulmonary:  ?   Effort: Pulmonary effort is normal.  ?   Breath sounds:  Normal breath sounds.  ?Abdominal:  ?   General: There is no distension.  ?   Palpations: Abdomen is soft.  ?   Tenderness: There is no abdominal tenderness. There is no guarding.  ?Musculoskeletal:     ?   General: No swelling or tenderness. Normal range of motion.  ?   Right lower leg: No edema.  ?   Left lower leg: No edema.  ?Skin: ?   General: Skin is warm and dry.  ?Neurological:  ?   General: No focal deficit present.  ?   Mental Status: She is oriented to person, place, and time.  ?   Motor: No weakness.  ?  Coordination: Coordination normal.  ? ? ?ED Results / Procedures / Treatments   ?Labs ?(all labs ordered are listed, but only abnormal results are displayed) ?Labs Reviewed  ?BASIC METABOLIC PANEL - Abnormal; Notable for the following components:  ?    Result Value  ? Glucose, Bld 100 (*)   ? All other components within normal limits  ?HEPATIC FUNCTION PANEL - Abnormal; Notable for the following components:  ? Indirect Bilirubin 0.2 (*)   ? All other components within normal limits  ?RESP PANEL BY RT-PCR (FLU A&B, COVID) ARPGX2  ?CBC  ?BRAIN NATRIURETIC PEPTIDE  ?LIPASE, BLOOD  ?URINALYSIS, ROUTINE W REFLEX MICROSCOPIC  ?TROPONIN I (HIGH SENSITIVITY)  ?TROPONIN I (HIGH SENSITIVITY)  ? ? ?EKG ?EKG Interpretation ? ?Date/Time:  Saturday July 23 2021 14:38:28 EDT ?Ventricular Rate:  75 ?PR Interval:  162 ?QRS Duration: 102 ?QT Interval:  414 ?QTC Calculation: 463 ?R Axis:   -4 ?Text Interpretation: Sinus rhythm Low voltage, precordial leads Abnormal R-wave progression, early transition no interval change from previous Confirmed by Charlesetta Shanks [id number]) on 07/23/2021 2:56:15 PM ? ?Radiology ?DG Chest 2 View ? ?Result Date: 07/23/2021 ?CLINICAL DATA:  Acute chest pain EXAM: CHEST - 2 VIEW COMPARISON:  03/22/2018 and prior radiographs FINDINGS: The cardiomediastinal silhouette is unremarkable. Mild LOWER lobe opacities on the LATERAL view may represent mild airspace disease versus atelectasis. There is no  evidence of pulmonary edema, suspicious pulmonary nodule/mass, pleural effusion, or pneumothorax. No acute bony abnormalities are identified. IMPRESSION: Mild LOWER lobe opacities on the LATERAL view which may repres

## 2021-07-23 NOTE — Discharge Instructions (Signed)
1.  Your discharge instructions include a lot of information. ?2.  You do have some known calcium buildup in your coronary arteries based on your CT scan.  At this time, your heart enzymes and your EKGs are normal in appearance.  Follow-up with your cardiologist as planned this week.  Return to emergency department immediately if you are getting new, worsening or concerning symptoms such as feeling lightheaded, sweaty, nauseated, any worsening or concerning associated symptoms. ?3.  You do have a history of reflux and recently have taken a few additional doses of ibuprofen.  Ibuprofen is a medication of a class called NSAIDs.  These can worsen symptoms for people who have reflux or esophageal problems or ulcers.  At this time I want you to avoid all use of NSAIDs (i.e. ibuprofen\Aleve\naproxen).  If you need pain control for your arthritis in your hands and wrists at night, you may choose to take either plain Tylenol or if needed a Vicodin tablet. ?4.  It is possible you have musculoskeletal chest pain.  This can occur unexpectedly.  You may try a combination of Tylenol and Robaxin to see if this is helpful for the pain.  Continue to take your Protonix daily as prescribed and be careful about all dietary measures to prevent reflux. ?5.  Follow-up with your doctor to discuss the results of your treatment at home and any changes to your symptoms.  Return to emergency department immediately if you are developing new concerning or worsening symptoms. ?

## 2021-07-28 ENCOUNTER — Ambulatory Visit (INDEPENDENT_AMBULATORY_CARE_PROVIDER_SITE_OTHER): Payer: No Typology Code available for payment source

## 2021-07-28 ENCOUNTER — Ambulatory Visit: Payer: No Typology Code available for payment source | Admitting: Internal Medicine

## 2021-07-28 ENCOUNTER — Encounter: Payer: Self-pay | Admitting: Internal Medicine

## 2021-07-28 ENCOUNTER — Encounter: Payer: Self-pay | Admitting: Student

## 2021-07-28 ENCOUNTER — Ambulatory Visit: Payer: No Typology Code available for payment source | Admitting: Student

## 2021-07-28 VITALS — BP 122/68 | HR 78 | Ht 63.0 in | Wt 191.4 lb

## 2021-07-28 VITALS — BP 112/62 | HR 69 | Temp 97.7°F | Ht 63.0 in | Wt 191.0 lb

## 2021-07-28 DIAGNOSIS — R0609 Other forms of dyspnea: Secondary | ICD-10-CM | POA: Diagnosis not present

## 2021-07-28 DIAGNOSIS — R079 Chest pain, unspecified: Secondary | ICD-10-CM

## 2021-07-28 DIAGNOSIS — R002 Palpitations: Secondary | ICD-10-CM

## 2021-07-28 DIAGNOSIS — R7302 Impaired glucose tolerance (oral): Secondary | ICD-10-CM

## 2021-07-28 DIAGNOSIS — R600 Localized edema: Secondary | ICD-10-CM

## 2021-07-28 DIAGNOSIS — E559 Vitamin D deficiency, unspecified: Secondary | ICD-10-CM | POA: Diagnosis not present

## 2021-07-28 DIAGNOSIS — R931 Abnormal findings on diagnostic imaging of heart and coronary circulation: Secondary | ICD-10-CM

## 2021-07-28 DIAGNOSIS — I2510 Atherosclerotic heart disease of native coronary artery without angina pectoris: Secondary | ICD-10-CM

## 2021-07-28 DIAGNOSIS — I8393 Asymptomatic varicose veins of bilateral lower extremities: Secondary | ICD-10-CM

## 2021-07-28 DIAGNOSIS — E785 Hyperlipidemia, unspecified: Secondary | ICD-10-CM

## 2021-07-28 MED ORDER — METOPROLOL TARTRATE 100 MG PO TABS: ORAL_TABLET | ORAL | 0 refills | Status: DC

## 2021-07-28 NOTE — Patient Instructions (Addendum)
Please continue all other medications as before, and refills have been done if requested. ? ?Please have the pharmacy call with any other refills you may need. ? ?Please continue your efforts at being more active, low cholesterol diet, and weight control. ? ?Please keep your appointments with your specialists as you may have planned - Cardiology later today ? ?Well see you Aug 18 at your next appt ? ? ? ?

## 2021-07-28 NOTE — Patient Instructions (Addendum)
Medication Instructions:  ?Take Lasix 20 mg one pill daily for 3 days ?Take Metoprolol 100 mg 90 mins before scan ? ?*If you need a refill on your cardiac medications before your next appointment, please call your pharmacy* ? ? ?Lab Work: ?Your physician recommends that you return for lab work 1 week before scan ?BMET ? ?If you have labs (blood work) drawn today and your tests are completely normal, you will receive your results only by: ?MyChart Message (if you have MyChart) OR ?A paper copy in the mail ?If you have any lab test that is abnormal or we need to change your treatment, we will call you to review the results. ? ? ?Testing/Procedures: ?Your physician has requested that you have an echocardiogram. Echocardiography is a painless test that uses sound waves to create images of your heart. It provides your doctor with information about the size and shape of your heart and how well your heart?s chambers and valves are working. This procedure takes approximately one hour. There are no restrictions for this procedure.  ? ? ? ?Your cardiac CT will be scheduled at one of the below locations:  ? ?[facility] ?[address] ?[place], [place] [address] ?[phone] ? ?OR ? ?[facility] ?[address] ?[address] ?[place], [place] [address] ?[phone] ? ?If scheduled at [facility], please arrive at the [facility] and Children's Entrance (Entrance C2) of [facility] 30 minutes prior to test start time. ?You can use the FREE valet parking offered at entrance C (encouraged to control the heart rate for the test)  ?Proceed to the [facility] Radiology Department (first floor) to check-in and test prep. ? ?All radiology patients and guests should use entrance C2 at [facility], accessed from [facility], even though the hospital's physical address listed is [address]. ? ? ? ?If scheduled at [facility], please arrive 15 mins early for check-in and test prep. ? ?Please follow these instructions carefully (unless otherwise directed): ? ? ?On the Night Before the Test: ?Be sure to Drink plenty of water. ?Do not consume any caffeinated/decaffeinated beverages or chocolate 12 hours prior to your test. ?Do not take any antihistamines 12 hours prior to your test. ?If the patient has contrast allergy: ?Patient will need a prescription for Prednisone and very clear instructions (as follows): ?Prednisone 50 mg - take 13 hours prior to test ?Take another Prednisone 50 mg 7 hours prior to test ?Take another Prednisone 50 mg 1 hour prior to test ?Take Benadryl 50 mg 1 hour prior to test ?Patient must complete all four doses of above prophylactic medications. ?Patient will need a ride after test due to Benadryl. ? ?On the Day of the Test: ?Drink plenty of water until 1 hour prior to the test. ?Do not eat any food 4 hours prior to the test. ?You may take your regular medications prior to the test.  ?Take metoprolol (Lopressor) two hours prior to test. ?HOLD Furosemide/Hydrochlorothiazide morning of the test. ?FEMALES- please wear underwire-free bra if available, avoid dresses & tight clothing ? ? ?*For Clinical Staff only. Please instruct patient the following:* ?Heart Rate Medication Recommendations for Cardiac CT  ?Resting HR < 50 bpm  ?No medication  ?Resting HR 50-60 bpm and BP >110/50 mmHG   ?Consider Metoprolol tartrate 25 mg PO 90-120 min prior to scan  ?Resting HR 60-65 bpm and BP >110/50 mmHG  ?Metoprolol tartrate 50 mg PO  90-120 minutes prior to scan   ?Resting HR > 65 bpm and BP >110/50 mmHG  ?Metoprolol tartrate 100 mg PO 90-120 minutes prior to scan  ?Consider Ivabradine 10-15 mg PO or a calcium channel blocker for resting HR >60 bpm and contraindication to metoprolol tartrate  ?Consider Ivabradine 10-15 mg PO in combination with metoprolol tartrate for HR >80 bpm   ? ?     ?After the Test: ?Drink plenty of  water. ?After receiving IV contrast, you may experience a mild flushed feeling. This is normal. ?On occasion, you may experience a mild rash up to 24 hours after the test. This is not dangerous. If this occurs, you can take Benadryl 25 mg and increase your fluid intake. ?If you experience trouble breathing, this can be serious. If it is severe call 911 IMMEDIATELY. If it is mild, please call our office. ?If you take any of these medications: Glipizide/Metformin, Avandament, Glucavance, please do not take 48 hours after completing test unless otherwise instructed. ? ?We will call to schedule your test 2-4 weeks out understanding that some insurance companies will need an authorization prior to the service being performed.  ? ?For non-scheduling related questions, please contact the cardiac imaging nurse navigator should you have any questions/concerns: ?Marchia Bond, Cardiac Imaging Nurse Navigator ?Gordy Clement, Cardiac Imaging Nurse Navigator ?[facility] ?Direct Office Dial: [phone]  ? ?For scheduling needs, including cancellations and rescheduling, please call [place], [phone].  ? ?ZIO AT Long term monitor-Live Telemetry ? ?Your physician has requested you wear a ZIO patch monitor for 3 days.  ?This is a single patch monitor. [employer] supplies one patch monitor per enrollment. Additional  ?stickers are not available.  ?Please do not apply patch if you will be having a Nuclear Stress Test, Echocardiogram, Cardiac CT, MRI,  ?or Chest Xray during the period you would be wearing the monitor. The patch cannot be worn during  ?these tests. You cannot remove and re-apply the ZIO AT patch monitor.  ?Your ZIO patch monitor will be mailed 3 day USPS to your address on file. It may take 3-5 days to  ?receive your monitor after you have been enrolled.  ?Once you have received your monitor, please review the enclosed instructions. Your monitor has  ?already been registered assigning a  specific monitor serial # to you.  ? ?Billing and Patient Assistance Program information ? ?[employer] has been supplied with any insurance information on record for billing. ?[employer] offers a sliding scale Patient Assistance Program for patients without insurance, or whose  ?insurance does not completely cover the cost of the ZIO patch monitor. You must apply for the  ?Patient Assistance Program to qualify for the discounted rate. To apply, call [employer] at [phone],  ?select option 4, select option 2 , ask to apply for the Patient Assistance Program, (you can request an  ?interpreter if needed). [employer] will ask your household income and how many people are in your  ?household. [employer] will quote your out-of-pocket cost based on this information. They will also be able  ?to set up a 12 month interest free payment plan if needed. ? ?Applying the monitor  ? ?Shave hair from upper left chest.  ?Hold the abrader disc by orange tab. Rub the abrader in 40 strokes over left upper chest as indicated in  ?your monitor instructions.  ?Clean area with 4 enclosed alcohol pads. Use all pads to ensure the area is cleaned thoroughly. Let  ?dry.  ?Apply patch  as indicated in monitor instructions. Patch will be placed under collarbone on left side of  ?chest with arrow pointing upward.  ?Rub patch adhesive wings for 2 minutes. Remove the white label marked "1". Remove the white label  ?marked "2". Rub patch adhesive wings for 2 additional minutes.  ?While looking in a mirror, press and release button in center of patch. A small green light will flash 3-4  ?times. This will be your only indicator that the monitor has been turned on.  ?Do not shower for the first 24 hours. You may shower after the first 24 hours.  ?Press the button if you feel a symptom. You will hear a small click. Record Date, Time and Symptom in  ?the Patient Log.  ? ?Starting the Gateway ? ?In your kit there is a small plastic box the size of a cellphone. This  is Airline pilot. It transmits all your  ?recorded data to [employer]. This box must always stay within 10 feet of you. Open the box and push the *  ?button. There will be a light that blinks orange and then gree

## 2021-07-28 NOTE — Progress Notes (Unsigned)
Enrolled for [employer] to mail a ZIO XT long term holter monitor to the patients address on file.   Dr. Harding to read. 
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
## 2021-07-28 NOTE — Assessment & Plan Note (Signed)
Last vitamin D ?Lab Results  ?Component Value Date  ? VD25OH 27.21 (L) 06/07/2021  ? ?Low, to take oral replacement ? ?

## 2021-07-29 ENCOUNTER — Other Ambulatory Visit: Payer: Self-pay | Admitting: Allergy & Immunology

## 2021-07-30 DIAGNOSIS — R931 Abnormal findings on diagnostic imaging of heart and coronary circulation: Secondary | ICD-10-CM | POA: Insufficient documentation

## 2021-07-30 NOTE — Assessment & Plan Note (Signed)
Atypical, etiology unclear, for f/u cardiology as planned ?

## 2021-07-30 NOTE — Assessment & Plan Note (Signed)
Lab Results  ?Component Value Date  ? HGBA1C 5.6 06/07/2021  ? ?Stable, pt to continue current medical treatment  - diet ? ? ?

## 2021-07-30 NOTE — Assessment & Plan Note (Signed)
Lab Results  ?Component Value Date  ? [place] 90 06/07/2021  ? ?Mild uncontrolled, pt to work on lower chol diet, cont lipitor 20 as declines change for now, for f/u lab next visit ?

## 2021-08-01 ENCOUNTER — Telehealth: Payer: Self-pay

## 2021-08-01 ENCOUNTER — Other Ambulatory Visit: Payer: Self-pay

## 2021-08-01 NOTE — Telephone Encounter (Signed)
Pt was wondering if clobetasol could be called in if so [facility] to [facility] [address].  ?

## 2021-08-01 NOTE — Telephone Encounter (Signed)
Pt called back and checking on refill request, I told her that Dr. Ernst Bowler was not in today ad will be here tomorrow and will respond then.  ?

## 2021-08-02 ENCOUNTER — Other Ambulatory Visit: Payer: Self-pay | Admitting: *Deleted

## 2021-08-02 MED ORDER — CLOBETASOL PROPIONATE 0.05 % EX CREA: TOPICAL_CREAM | Freq: Two times a day (BID) | CUTANEOUS | 0 refills | Status: DC

## 2021-08-02 NOTE — Telephone Encounter (Signed)
Courtesy refill has been sent in. Called patient and advised, patient verbalized understanding. Patient has been scheduled to see Dr. Ernst Bowler in [place] in early May.  ?

## 2021-08-02 NOTE — Telephone Encounter (Signed)
Sure we can give another script. She needs an OV. ? ?Salvatore Marvel, MD ?[facility] ? ?

## 2021-08-07 DIAGNOSIS — R002 Palpitations: Secondary | ICD-10-CM | POA: Diagnosis not present

## 2021-08-08 LAB — BASIC METABOLIC PANEL
BUN/Creatinine Ratio: 16 (ref 12–28)
BUN: 14 mg/dL (ref 8–27)
CO2: 24 mmol/L (ref 20–29)
Calcium: 9.8 mg/dL (ref 8.7–10.3)
Chloride: 103 mmol/L (ref 96–106)
Creatinine, Ser: 0.85 mg/dL (ref 0.57–1.00)
Glucose: 91 mg/dL (ref 70–99)
Potassium: 4.3 mmol/L (ref 3.5–5.2)
Sodium: 143 mmol/L (ref 134–144)
eGFR: 77 mL/min/{1.73_m2} (ref >59)

## 2021-08-10 ENCOUNTER — Encounter: Payer: Self-pay | Admitting: Internal Medicine

## 2021-08-10 ENCOUNTER — Ambulatory Visit (HOSPITAL_COMMUNITY): Payer: No Typology Code available for payment source | Attending: Internal Medicine

## 2021-08-10 DIAGNOSIS — R079 Chest pain, unspecified: Secondary | ICD-10-CM | POA: Diagnosis not present

## 2021-08-10 DIAGNOSIS — I2510 Atherosclerotic heart disease of native coronary artery without angina pectoris: Secondary | ICD-10-CM

## 2021-08-10 LAB — ECHOCARDIOGRAM COMPLETE
Area-P 1/2: 3.53 cm2
S' Lateral: 2.5 cm

## 2021-08-10 MED ORDER — FUROSEMIDE 40 MG PO TABS: 40.0000 mg | ORAL_TABLET | Freq: Every day | ORAL | 2 refills | Status: DC | PRN

## 2021-08-11 ENCOUNTER — Telehealth (HOSPITAL_COMMUNITY): Payer: Self-pay | Admitting: Emergency Medicine

## 2021-08-11 NOTE — Telephone Encounter (Signed)
Reaching out to patient to offer assistance regarding upcoming cardiac imaging study; pt verbalizes understanding of appt date/time, parking situation and where to check in, pre-test NPO status and medications ordered, and verified current allergies; name and call back number provided for further questions should they arise ?Marchia Bond RN Navigator Cardiac Imaging ?[facility] ?[phone] office ?[phone] cell ? ?Denies iv issues ?'100mg'$  metoprolol tartrate ?Arrival 0930 ?

## 2021-08-12 ENCOUNTER — Ambulatory Visit (HOSPITAL_COMMUNITY)
Admission: RE | Admit: 2021-08-12 | Discharge: 2021-08-12 | Disposition: A | Discharge status: Home / Self Care | Payer: No Typology Code available for payment source | Source: Ambulatory Visit | Attending: Student | Admitting: Student

## 2021-08-12 DIAGNOSIS — I2510 Atherosclerotic heart disease of native coronary artery without angina pectoris: Secondary | ICD-10-CM

## 2021-08-12 DIAGNOSIS — R079 Chest pain, unspecified: Secondary | ICD-10-CM | POA: Diagnosis present

## 2021-08-12 MED ORDER — NITROGLYCERIN 0.4 MG SL SUBL
0.8000 mg | SUBLINGUAL_TABLET | Freq: Once | SUBLINGUAL | Status: AC
  Administered 2021-08-12: 0.8 mg via SUBLINGUAL

## 2021-08-12 MED ORDER — NITROGLYCERIN 0.4 MG SL SUBL
SUBLINGUAL_TABLET | SUBLINGUAL | Status: AC
Start: 2021-08-12 — End: 2021-08-12
  Filled 2021-08-12: qty 2

## 2021-08-12 MED ORDER — IOHEXOL 350 MG/ML SOLN
100.0000 mL | Freq: Once | INTRAVENOUS | Status: AC | PRN
  Administered 2021-08-12: 100 mL via INTRAVENOUS

## 2021-08-15 ENCOUNTER — Other Ambulatory Visit: Payer: Self-pay

## 2021-08-15 DIAGNOSIS — I2510 Atherosclerotic heart disease of native coronary artery without angina pectoris: Secondary | ICD-10-CM

## 2021-08-15 DIAGNOSIS — Z79899 Other long term (current) drug therapy: Secondary | ICD-10-CM

## 2021-08-15 DIAGNOSIS — E785 Hyperlipidemia, unspecified: Secondary | ICD-10-CM

## 2021-08-23 ENCOUNTER — Ambulatory Visit: Payer: No Typology Code available for payment source | Admitting: Allergy & Immunology

## 2021-08-23 VITALS — BP 118/64 | HR 78 | Temp 98.0°F | Resp 16 | Ht 63.0 in | Wt 193.5 lb

## 2021-08-23 DIAGNOSIS — L501 Idiopathic urticaria: Secondary | ICD-10-CM | POA: Diagnosis not present

## 2021-08-23 DIAGNOSIS — L299 Pruritus, unspecified: Secondary | ICD-10-CM | POA: Diagnosis not present

## 2021-08-23 DIAGNOSIS — J4540 Moderate persistent asthma, uncomplicated: Secondary | ICD-10-CM | POA: Diagnosis not present

## 2021-08-23 MED ORDER — CLOBETASOL PROPIONATE 0.05 % EX CREA: TOPICAL_CREAM | Freq: Two times a day (BID) | CUTANEOUS | 0 refills | Status: DC

## 2021-08-23 MED ORDER — FLUTICASONE-SALMETEROL 250-50 MCG/ACT IN AEPB: INHALATION_SPRAY | RESPIRATORY_TRACT | 5 refills | Status: DC

## 2021-08-23 MED ORDER — ALBUTEROL SULFATE HFA 108 (90 BASE) MCG/ACT IN AERS
2.0000 | INHALATION_SPRAY | RESPIRATORY_TRACT | 3 refills | Status: DC | PRN
Start: 2021-08-23 — End: 2022-11-06

## 2021-08-23 NOTE — Progress Notes (Signed)
? ?FOLLOW UP ? ?Date of Service/Encounter:  08/23/21 ? ? ?Assessment:  ? ?Pruritis - with sensitization to dust mite  ?  ?Chronic urticaria - doing better on Xolair every 2 weeks (administered at home) ?  ?Mild eosinophilia (AEC 500) ?  ?Intermittent asthma, uncomplicated ? ?Plan/Recommendations:  ? ?1. Moderate intermittent asthma, uncomplicated ?- Lung testing looks good today (even though you broke our spiro machines!).   ?- I think we are in a good spot from a breathing perspective.  ?- Daily controller medication(s): NOTHING ?- Rescue medications: albuterol 4 puffs every 4-6 hours as needed WITH COUGHING/WHEEZING ?- Changes during respiratory infections or worsening symptoms: Add on Advair 250/22mg to 1 puff twice daily for TWO WEEKS TO PREVENT PREDNISONE. ?- Asthma control goals:  ?* Full participation in all desired activities (may need albuterol before activity) ?* Albuterol use two time or less a week on average (not counting use with activity) ?* Cough interfering with sleep two time or less a month ?* Oral steroids no more than once a year ?* No hospitalizations ? ?2. Pruritus with urticaria ?- We are going to continue with Xolair every two weeks for now.  ?- Continue the hydroxyzine as needed.  ?- Let's try stopping the Pepcid (famotidine) to see how that goes. ?- Continue with the following: ? AM: Allegra 1-2 tablets ? PM: Allegra 1-2 tablets  ?- Continue with the use of clobetasol as needed.  ? ?3. Return in about 3 months (around 04/11/2021).  ? ?Subjective:  ? ?Isabel UEDAis a 66y.o. female presenting today for follow up of  ?Chief Complaint  ?Patient presents with  ? Follow-up  ? ? ?Isabel GOONANhas a history of the following: ?Patient Active Problem List  ? Diagnosis Date Noted  ? Elevated coronary artery calcium score 07/30/2021  ? Vitamin D deficiency 06/11/2021  ? Dysuria 08/31/2020  ? Chronic pruritus 05/30/2020  ? Rash 04/03/2020  ? Chest pain 11/14/2019  ? Insomnia 11/14/2019  ?  Pelvic prolapse 10/06/2019  ? Leg pain, bilateral 05/01/2019  ? Acute diverticulitis 12/23/2018  ? Asthma 10/02/2018  ? Pain in right hand 07/12/2018  ? Pain in both feet 11/08/2017  ? Nodule of chest wall 10/11/2017  ? Mass of right side of neck 10/11/2017  ? Varicose veins of bilateral lower extremities with other complications 026/[id number] ? Complication of anesthesia   ? Right groin pain 04/21/2017  ? Hyperlipidemia LDL goal <100 03/23/2017  ? Dorsalgia 03/01/2017  ? Rapid palpitations 03/01/2017  ? Preop cardiovascular exam 03/01/2017  ? Right lumbar radiculopathy 08/15/2016  ? Rib pain on right side 03/03/2016  ? Nosebleed 03/03/2016  ? Acute colitis 08/30/2013  ? GERD (gastroesophageal reflux disease) 08/14/2012  ? Pain and swelling of lower leg 10/23/2011  ? Depression 07/10/2011  ? History of renal stone 04/15/2011  ? Pleural effusion 04/14/2011  ? Impaired glucose tolerance 04/14/2011  ? Hypokalemia 04/14/2011  ? Chronic low back pain 03/29/2011  ? Obesity 03/29/2011  ? Encounter for well adult exam with abnormal findings 12/30/2010  ? THYROID NODULE, RIGHT 02/21/2010  ? NECK PAIN, RIGHT 02/21/2010  ? Bilateral lower extremity edema 02/21/2010  ? Atony of bladder 11/10/2009  ? Fatigue 03/11/2009  ? SINUSITIS, CHRONIC 11/27/2008  ? Hypothyroidism 07/19/2007  ? Anxiety state 07/19/2007  ? Allergic rhinitis 07/19/2007  ? DIVERTICULOSIS, COLON 07/19/2007  ? DIndian HeadDISEASE 07/19/2007  ? COLONIC POLYPS, HX OF 07/19/2007  ? ? ?History obtained from: chart review  and patient. ? ?[name] is a 66 y.o. female presenting for a follow up visit.  She was last seen in September 2022.  The lung testing looked great today.  We continued with albuterol only during flares and Advair added 1 puff twice daily for 2 weeks.  For her urticaria, we worked on increasing the Xolair to every 2 weeks.  We also added on Opzelura twice daily to see if this helps.  We continued with Allegra 1 to 2 tablets twice daily and Pepcid  twice daily. ?  ?Since last visit, she has mostly done well. She is still having construction next door and she keeps hoping that this is going to be done soon. She is still having aunts (little red or black ants). They tend to sting the same areas consistently. She never had this problem prior to [place].  ? ?She remains on the Xolair every 2 weeks.  This seems to be controlling her itching fairly well.  She does start to itch a little bit more around 1 to 2 days before her next dose, so she does not think that she needs to spread these out any longer.  Her last testing was performed in February 2022 and was negative to the 5 work-up that I signed, including a chronic urticaria panel.  Alpha gal and serum tryptase were normal as well.  She is currently is in a much better spot now than she was when I first met her around a year ago. ? ?Asthma is under good control.  She remains on Advair only use during flares. [name]'s asthma has been well controlled. She has not required rescue medication, experienced nocturnal awakenings due to lower respiratory symptoms, nor have activities of daily living been limited. She has required no Emergency Department or Urgent Care visits for her asthma. She has required zero courses of systemic steroids for asthma exacerbations since the last visit. ACT score today is 25, indicating excellent asthma symptom control.  ? ?She had to have an echocardiogram recently. She was going to [employer] with her husband and she felt that she got very weak very quickly. She thought that she was just hungry. But she had to sit down in the middle of the grocery store. She left the groceries and then went to the hospital. Echocardiogram was normal.  ? ?Otherwise, there have been no changes to her past medical history, surgical history, family history, or social history. ? ? ? ?Review of Systems  ?Constitutional: Negative.  Negative for chills, fever, malaise/fatigue and weight loss.  ?HENT: Negative.   Negative for congestion, ear discharge, ear pain and sinus pain.   ?Eyes:  Negative for pain, discharge and redness.  ?Respiratory:  Negative for cough, sputum production, shortness of breath and wheezing.   ?Cardiovascular: Negative.  Negative for chest pain and palpitations.  ?Gastrointestinal:  Negative for abdominal pain, constipation, diarrhea, heartburn, nausea and vomiting.  ?Skin: Negative.  Negative for itching and rash.  ?Neurological:  Negative for dizziness and headaches.  ?Endo/Heme/Allergies:  Negative for environmental allergies. Does not bruise/bleed easily.  ?All other systems reviewed and are negative.  ? ? ? ?Objective:  ? ?Blood pressure 118/64, pulse 78, temperature 98 ?F (36.7 ?C), resp. rate 16, height $RemoveBe'5\' 3"'sDAhOoPwv$  (1.6 m), weight 193 lb 8 oz (87.8 kg), SpO2 99 %. ?Body mass index is 34.28 kg/m?. ? ? ? ?Physical Exam ?Vitals reviewed.  ?Constitutional:   ?   Appearance: She is well-developed.  ?HENT:  ?   Head:  Normocephalic and atraumatic.  ?   Right Ear: Tympanic membrane, ear canal and external ear normal.  ?   Left Ear: Tympanic membrane, ear canal and external ear normal.  ?   Nose: No nasal deformity, septal deviation, mucosal edema or rhinorrhea.  ?   Right Turbinates: Enlarged, swollen and pale.  ?   Left Turbinates: Enlarged, swollen and pale.  ?   Right Sinus: No maxillary sinus tenderness or frontal sinus tenderness.  ?   Left Sinus: No maxillary sinus tenderness or frontal sinus tenderness.  ?   Comments: No nasal polyps. ?   Mouth/Throat:  ?   Mouth: Mucous membranes are not pale and not dry.  ?   Pharynx: Uvula midline.  ?Eyes:  ?   General: Lids are normal. No allergic shiner.    ?   Right eye: No discharge.     ?   Left eye: No discharge.  ?   Conjunctiva/sclera: Conjunctivae normal.  ?   Right eye: Right conjunctiva is not injected. No chemosis. ?   Left eye: Left conjunctiva is not injected. No chemosis. ?   Pupils: Pupils are equal, round, and reactive to light.  ?Cardiovascular:   ?   Rate and Rhythm: Normal rate and regular rhythm.  ?   Heart sounds: Normal heart sounds.  ?Pulmonary:  ?   Effort: Pulmonary effort is normal. No tachypnea, accessory muscle usage or respiratory dist

## 2021-08-23 NOTE — Patient Instructions (Addendum)
1. Moderate intermittent asthma, uncomplicated ?- Lung testing looks good today (even though you broke our spiro machines!).   ?- I think we are in a good spot from a breathing perspective.  ?- Daily controller medication(s): NOTHING ?- Rescue medications: albuterol 4 puffs every 4-6 hours as needed WITH COUGHING/WHEEZING ?- Changes during respiratory infections or worsening symptoms: Add on Advair 250/75mg to 1 puff twice daily for TWO WEEKS TO PREVENT PREDNISONE. ?- Asthma control goals:  ?* Full participation in all desired activities (may need albuterol before activity) ?* Albuterol use two time or less a week on average (not counting use with activity) ?* Cough interfering with sleep two time or less a month ?* Oral steroids no more than once a year ?* No hospitalizations ? ?2. Pruritus with urticaria ?- We are going to continue with Xolair every two weeks for now.  ?- Continue the hydroxyzine as needed.  ?- Let's try stopping the Pepcid (famotidine) to see how that goes. ?- Continue with the following: ? AM: Allegra 1-2 tablets ? PM: Allegra 1-2 tablets  ?- Continue with the use of clobetasol as needed.  ? ?3. Return in about 3 months (around 04/11/2021).  ? ? ?Please inform u[place]of any Emergency Department visits, hospitalizations, or changes in symptoms. Call u[place]abefore going to the ED for breathing or allergy symptoms since we might be able to fit you in for a sick visit. Feel free to contact u[place]anytime with any questions, problems, or concerns. ? ?It was a pleasure to see you again today! ? ?Websites that have reliable patient information: ?1. American Academy of Asthma, Allergy, and Immunology: www.aaaai.org ?2. Food Allergy Research and Education (FARE): foodallergy.org ?3. Mothers of Asthmatics: http://www.asthmacommunitynetwork.org ?4. [employer]of Allergy, Asthma, and Immunology: wMonthlyElectricBill.co.uk? ? ?COVID-19 Vaccine Information can be found at:  hShippingScam.co.ukFor questions related to vaccine distribution or appointments, please email [email or web address] or call [phone]  ? ?We realize that you might be concerned about having an allergic reaction to the COVID19 vaccines. To help with that concern, WE ARE OFFERING THE COVID19 VACCINES IN OUR OFFICE! Ask the front desk for dates!  ? ? ? ??Like? u[place]on Facebook and Instagram for our latest updates!  ?  ? ? ?A healthy democracy works best when ANew York Life Insuranceparticipate! Make sure you are registered to vote! If you have moved or changed any of your contact information, you will need to get this updated before voting! ? ?In some cases, you MAY be able to register to vote online: [email or web address]? ? ? ? ? ?

## 2021-08-25 ENCOUNTER — Encounter: Payer: Self-pay | Admitting: Allergy & Immunology

## 2021-09-14 ENCOUNTER — Encounter: Payer: Self-pay | Admitting: Cardiology

## 2021-09-15 MED ORDER — ROSUVASTATIN CALCIUM 40 MG PO TABS: 40.0000 mg | ORAL_TABLET | Freq: Every day | ORAL | 6 refills | Status: DC

## 2021-09-15 NOTE — Telephone Encounter (Signed)
Rosuvastatin '40mg'$  refill sent(per CTA scan result). Pt notified via voicemail and mychart message. She has already started rosuva '40mg'$ , she has been taking 2-'20mg'$  for a month'ish. And will return for fasting lab as ordered.
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
## 2021-10-12 ENCOUNTER — Ambulatory Visit: Payer: No Typology Code available for payment source | Admitting: Physician Assistant

## 2021-10-12 VITALS — BP 112/64 | HR 85 | Ht 63.5 in | Wt 191.0 lb

## 2021-10-12 DIAGNOSIS — R002 Palpitations: Secondary | ICD-10-CM | POA: Diagnosis not present

## 2021-10-12 DIAGNOSIS — R600 Localized edema: Secondary | ICD-10-CM | POA: Diagnosis not present

## 2021-10-12 DIAGNOSIS — I8393 Asymptomatic varicose veins of bilateral lower extremities: Secondary | ICD-10-CM | POA: Diagnosis not present

## 2021-10-12 DIAGNOSIS — R0609 Other forms of dyspnea: Secondary | ICD-10-CM | POA: Diagnosis not present

## 2021-10-12 DIAGNOSIS — R079 Chest pain, unspecified: Secondary | ICD-10-CM

## 2021-10-12 DIAGNOSIS — E782 Mixed hyperlipidemia: Secondary | ICD-10-CM

## 2021-10-12 MED ORDER — METOPROLOL TARTRATE 25 MG PO TABS: 12.5000 mg | ORAL_TABLET | Freq: Two times a day (BID) | ORAL | 3 refills | Status: DC

## 2021-10-12 NOTE — Patient Instructions (Addendum)
Medication Instructions:  Start Lopressor 12.5 mg twice daily   *If you need a refill on your cardiac medications before your next appointment, please call your pharmacy*   Lab Work: NONE ordered at this time of appointment   If you have labs (blood work) drawn today and your tests are completely normal, you will receive your results only by: [place] (if you have MyChart) OR A paper copy in the mail If you have any lab test that is abnormal or we need to change your treatment, we will call you to review the results.   Testing/Procedures: NONE ordered at this time of appointment     Follow-Up: At [facility], you and your health needs are our priority.  As part of our continuing mission to provide you with exceptional heart care, we have created designated Provider Care Teams.  These Care Teams include your primary Cardiologist (physician) and Advanced Practice Providers (APPs -  Physician Assistants and Nurse Practitioners) who all work together to provide you with the care you need, when you need it.  We recommend signing up for the patient portal called "MyChart".  Sign up information is provided on this After Visit Summary.  MyChart is used to connect with patients for Virtual Visits (Telemedicine).  Patients are able to view lab/test results, encounter notes, upcoming appointments, etc.  Non-urgent messages can be sent to your provider as well.   To learn more about what you can do with MyChart, go to [email or web address].    Your next appointment:   3 month(s)  The format for your next appointment:   In Person  Provider:   Glenetta Hew, MD  or   Sande Rives, PA    Other Instructions  Preventing High Cholesterol Cholesterol is a white, waxy substance similar to fat that the human body needs to help build cells. The liver makes all the cholesterol that a person's body needs. Having high cholesterol (hypercholesterolemia) increases your risk for heart  disease and stroke. Extra or excess cholesterol comes from the food that you eat. High cholesterol can often be prevented with diet and lifestyle changes. If you already have high cholesterol, you can control it with diet, lifestyle changes, and medicines. How can high cholesterol affect me? If you have high cholesterol, fatty deposits (plaques) may build up on the walls of your blood vessels. The blood vessels that carry blood away from your heart are called arteries. Plaques make the arteries narrower and stiffer. This in turn can: Restrict or block blood flow and cause blood clots to form. Increase your risk for heart attack and stroke. What can increase my risk for high cholesterol? This condition is more likely to develop in people who: Eat foods that are high in saturated fat or cholesterol. Saturated fat is mostly found in foods that come from animal sources. Are overweight. Are not getting enough exercise. Use products that contain nicotine or tobacco, such as cigarettes, e-cigarettes, and chewing tobacco. Have a family history of high cholesterol (familial hypercholesterolemia). What actions can I take to prevent this? Nutrition  Eat less saturated fat. Avoid trans fats (partially hydrogenated oils). These are often found in margarine and in some baked goods, fried foods, and snacks bought in packages. Avoid precooked or cured meat, such as bacon, sausages, or meat loaves. Avoid foods and drinks that have added sugars. Eat more fruits, vegetables, and whole grains. Choose healthy sources of protein, such as fish, poultry, lean cuts of red meat, beans, peas, lentils, and  nuts. Choose healthy sources of fat, such as: Nuts. Vegetable oils, especially olive oil. Fish that have healthy fats, such as omega-3 fatty acids. These fish include mackerel or salmon. Lifestyle Lose weight if you are overweight. Maintaining a healthy body mass index (BMI) can help prevent or control high  cholesterol. It can also lower your risk for diabetes and high blood pressure. Ask your health care provider to help you with a diet and exercise plan to lose weight safely. Do not use any products that contain nicotine or tobacco. These products include cigarettes, chewing tobacco, and vaping devices, such as e-cigarettes. If you need help quitting, ask your health care provider. Alcohol use Do not drink alcohol if: Your health care provider tells you not to drink. You are pregnant, may be pregnant, or are planning to become pregnant. If you drink alcohol: Limit how much you have to: 0-1 drink a day for women. 0-2 drinks a day for men. Know how much alcohol is in your drink. In the [place] one drink equals one 12 oz bottle of beer (355 mL), one 5 oz glass of wine (148 mL), or one 1 oz glass of hard liquor (44 mL). Activity  Get enough exercise. Do exercises as told by your health care provider. Each week, do at least 150 minutes of exercise that takes a medium level of effort (moderate-intensity exercise). This kind of exercise: Makes your heart beat faster while allowing you to still be able to talk. Can be done in short sessions several times a day or longer sessions a few times a week. For example, on 5 days each week, you could walk fast or ride your bike 3 times a day for 10 minutes each time. Medicines Your health care provider may recommend medicines to help lower cholesterol. This may be a medicine to lower the amount of cholesterol that your liver makes. You may need medicine if: Diet and lifestyle changes have not lowered your cholesterol enough. You have high cholesterol and other risk factors for heart disease or stroke. Take over-the-counter and prescription medicines only as told by your health care provider. General information Manage your risk factors for high cholesterol. Talk with your health care provider about all your risk factors and how to lower your risk. Manage other  conditions that you have, such as diabetes or high blood pressure (hypertension). Have blood tests to check your cholesterol levels at regular points in time as told by your health care provider. Keep all follow-up visits. This is important. Where to find more information American Heart Association: www.heart.org National Heart, Lung, and Blood Institute: https://wilson-eaton.com/ Summary High cholesterol increases your risk for heart disease and stroke. By keeping your cholesterol level low, you can reduce your risk for these conditions. High cholesterol can often be prevented with diet and lifestyle changes. Work with your health care provider to manage your risk factors, and have your blood tested regularly. This information is not intended to replace advice given to you by your health care provider. Make sure you discuss any questions you have with your health care provider. Document Revised: 06/14/2020 Document Reviewed: 06/14/2020 Elsevier Patient Education  [place]

## 2021-10-19 ENCOUNTER — Telehealth: Payer: Self-pay | Admitting: Internal Medicine

## 2021-10-19 NOTE — Telephone Encounter (Signed)
Caller & Relationship to patient:Self   Call back OYDXAJ:[phone]   Date of last office visit:   Date of next office visit:   Medication(s) to be refilled:hydrOXYzine (ATARAX/VISTARIL) 10 MG tablet  zolpidem (AMBIEN) 10 MG tablet       Preferred Pharm[place]:  [id number] [place]  [phone] Fax:  [phone]

## 2021-10-20 MED ORDER — HYDROXYZINE HCL 10 MG PO TABS: 10.0000 mg | ORAL_TABLET | Freq: Three times a day (TID) | ORAL | 1 refills | Status: DC | PRN

## 2021-10-20 MED ORDER — ZOLPIDEM TARTRATE 10 MG PO TABS: ORAL_TABLET | ORAL | 1 refills | Status: DC

## 2021-10-26 ENCOUNTER — Ambulatory Visit: Payer: No Typology Code available for payment source | Admitting: Internal Medicine

## 2021-10-26 ENCOUNTER — Encounter: Payer: Self-pay | Admitting: Internal Medicine

## 2021-10-26 VITALS — BP 112/64 | HR 73 | Temp 98.0°F | Ht 63.0 in | Wt 189.5 lb

## 2021-10-26 DIAGNOSIS — R7302 Impaired glucose tolerance (oral): Secondary | ICD-10-CM | POA: Diagnosis not present

## 2021-10-26 DIAGNOSIS — E785 Hyperlipidemia, unspecified: Secondary | ICD-10-CM | POA: Diagnosis not present

## 2021-10-26 DIAGNOSIS — E559 Vitamin D deficiency, unspecified: Secondary | ICD-10-CM | POA: Diagnosis not present

## 2021-10-26 DIAGNOSIS — R051 Acute cough: Secondary | ICD-10-CM | POA: Diagnosis not present

## 2021-10-26 DIAGNOSIS — R062 Wheezing: Secondary | ICD-10-CM | POA: Diagnosis not present

## 2021-10-26 DIAGNOSIS — E538 Deficiency of other specified B group vitamins: Secondary | ICD-10-CM

## 2021-10-26 DIAGNOSIS — R059 Cough, unspecified: Secondary | ICD-10-CM | POA: Insufficient documentation

## 2021-10-26 LAB — HEPATIC FUNCTION PANEL
ALT: 19 U/L (ref 0–35)
AST: 21 U/L (ref 0–37)
Albumin: 4.4 g/dL (ref 3.5–5.2)
Alkaline Phosphatase: 73 U/L (ref 39–117)
Bilirubin, Direct: 0.1 mg/dL (ref 0.0–0.3)
Total Bilirubin: 0.4 mg/dL (ref 0.2–1.2)
Total Protein: 7.2 g/dL (ref 6.0–8.3)

## 2021-10-26 LAB — BASIC METABOLIC PANEL
BUN: 13 mg/dL (ref 6–23)
CO2: 29 mEq/L (ref 19–32)
Calcium: 9.6 mg/dL (ref 8.4–10.5)
Chloride: 101 mEq/L (ref 96–112)
Creatinine, Ser: 0.78 mg/dL (ref 0.40–1.20)
GFR: 80.5 mL/min (ref >60.00)
Glucose, Bld: 98 mg/dL (ref 70–99)
Potassium: 3.6 mEq/L (ref 3.5–5.1)
Sodium: 140 mEq/L (ref 135–145)

## 2021-10-26 LAB — CBC WITH DIFFERENTIAL/PLATELET
Basophils Absolute: 0.1 10*3/uL (ref 0.0–0.1)
Basophils Relative: 0.9 % (ref 0.0–3.0)
Eosinophils Absolute: 0.3 10*3/uL (ref 0.0–0.7)
Eosinophils Relative: 5 % (ref 0.0–5.0)
HCT: 40.5 % (ref 36.0–46.0)
Hemoglobin: 13.5 g/dL (ref 12.0–15.0)
Lymphocytes Relative: 26.3 % (ref 12.0–46.0)
Lymphs Abs: 1.6 10*3/uL (ref 0.7–4.0)
MCHC: 33.3 g/dL (ref 30.0–36.0)
MCV: 91.8 fl (ref 78.0–100.0)
Monocytes Absolute: 0.4 10*3/uL (ref 0.1–1.0)
Monocytes Relative: 7.1 % (ref 3.0–12.0)
Neutro Abs: 3.7 10*3/uL (ref 1.4–7.7)
Neutrophils Relative %: 60.7 % (ref 43.0–77.0)
Platelets: 214 10*3/uL (ref 150.0–400.0)
RBC: 4.41 Mil/uL (ref 3.87–5.11)
RDW: 13.2 % (ref 11.5–15.5)
WBC: 6.1 10*3/uL (ref 4.0–10.5)

## 2021-10-26 LAB — LIPID PANEL
Cholesterol: 114 mg/dL (ref 0–200)
HDL: 54.7 mg/dL (ref >39.00)
LDL Cholesterol: 42 mg/dL (ref 0–99)
NonHDL: 59.24
Total CHOL/HDL Ratio: 2
Triglycerides: 85 mg/dL (ref 0.0–149.0)
VLDL: 17 mg/dL (ref 0.0–40.0)

## 2021-10-26 LAB — VITAMIN D 25 HYDROXY (VIT D DEFICIENCY, FRACTURES): VITD: 41.57 ng/mL (ref 30.00–100.00)

## 2021-10-26 LAB — VITAMIN B12: Vitamin B-12: 421 pg/mL (ref 211–911)

## 2021-10-26 LAB — TSH: TSH: 0.1 u[IU]/mL — ABNORMAL LOW (ref 0.35–5.50)

## 2021-10-26 MED ORDER — HYDROCODONE BIT-HOMATROP MBR 5-1.5 MG/5ML PO SOLN
5.0000 mL | Freq: Four times a day (QID) | ORAL | 0 refills | Status: AC | PRN
Start: 2021-10-26 — End: 2021-11-05

## 2021-10-26 MED ORDER — DOXYCYCLINE HYCLATE 100 MG PO TABS: 100.0000 mg | ORAL_TABLET | Freq: Two times a day (BID) | ORAL | 0 refills | Status: DC

## 2021-10-26 MED ORDER — PREDNISONE 10 MG PO TABS: ORAL_TABLET | ORAL | 0 refills | Status: DC

## 2021-10-26 MED ORDER — METHYLPREDNISOLONE ACETATE 80 MG/ML IJ SUSP
80.0000 mg | Freq: Once | INTRAMUSCULAR | Status: AC
  Administered 2021-10-26: 80 mg via INTRAMUSCULAR

## 2021-10-26 NOTE — Assessment & Plan Note (Signed)
Lab Results  Component Value Date   LDLCALC 90 06/07/2021   Stable, pt to continue current statin crestor 40 mg qd, and f/u lab next visit

## 2021-10-26 NOTE — Addendum Note (Signed)
Addended by: Biagio Borg on: 10/26/2021 07:41 PM   Modules accepted: Orders
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
## 2021-10-26 NOTE — Assessment & Plan Note (Signed)
Mild, for depomedrol IM '80mg'$  qd, prednisone taper asd,, cont inhaler prn

## 2021-10-26 NOTE — Assessment & Plan Note (Signed)
Last vitamin D Lab Results  Component Value Date   VD25OH 27.21 (L) 06/07/2021   Low, reminded to start oral replacement

## 2021-10-26 NOTE — Patient Instructions (Signed)
You had the steroid shot today  Please take all new medication as prescribed - the antibiotic, cough medicine, and prednisone  Please continue all other medications as before, and refills have been done if requested.  Please have the pharmacy call with any other refills you may need.  Please keep your appointments with your specialists as you may have planned   

## 2021-10-26 NOTE — Assessment & Plan Note (Signed)
Lab Results  Component Value Date   HGBA1C 5.6 06/07/2021   Stable, pt to continue current medical treatment  - diet, wt control, excercise

## 2021-10-26 NOTE — Assessment & Plan Note (Signed)
Mild to mod, c/w bronchitis vs pna, for antibx course, cough med prn,  to f/u any worsening symptoms or concerns 

## 2021-10-27 ENCOUNTER — Other Ambulatory Visit: Payer: Self-pay | Admitting: Internal Medicine

## 2021-10-27 LAB — URINALYSIS, ROUTINE W REFLEX MICROSCOPIC
Bilirubin Urine: NEGATIVE
Hgb urine dipstick: NEGATIVE
Ketones, ur: NEGATIVE
Nitrite: NEGATIVE
Specific Gravity, Urine: 1.01 (ref 1.000–1.030)
Total Protein, Urine: NEGATIVE
Urine Glucose: NEGATIVE
Urobilinogen, UA: 0.2 (ref 0.0–1.0)
pH: 7 (ref 5.0–8.0)

## 2021-10-27 LAB — HEMOGLOBIN A1C: Hgb A1c MFr Bld: 5.8 % (ref 4.6–6.5)

## 2021-10-27 MED ORDER — LEVOTHYROXINE SODIUM 112 MCG PO TABS: 112.0000 ug | ORAL_TABLET | Freq: Every day | ORAL | 3 refills | Status: DC

## 2021-10-31 ENCOUNTER — Encounter: Payer: Self-pay | Admitting: Internal Medicine

## 2021-10-31 ENCOUNTER — Encounter: Payer: Self-pay | Admitting: Cardiology

## 2021-11-01 NOTE — Telephone Encounter (Signed)
I am not 100% sure what the indication for sleep study would be.  There are certain criteria that need to be met.  We will need to see some signs symptoms that were concerning.  If there is concern about close off airway the question is is that due to swelling was due to some type of structural normality that would be something that the cardiologist I would not address.  This would be something that would be more likely that her PCP would look into an potentially refer to ENT.  I have not seen her in a while, will be hard for me to put in a referral site unseen.  PCP can also put a referral for sleep study, especially since he was just seen last week.  If he is not comfortable sending a referral for OSA I think we had to have her come in to be seen to evaluate and make a formal referral.  Glenetta Hew, MD

## 2021-11-02 ENCOUNTER — Encounter: Payer: Self-pay | Admitting: Family Medicine

## 2021-11-02 ENCOUNTER — Ambulatory Visit: Payer: No Typology Code available for payment source | Admitting: Family Medicine

## 2021-11-02 VITALS — BP 110/76 | HR 70 | Temp 97.6°F | Ht 63.0 in | Wt 186.0 lb

## 2021-11-02 DIAGNOSIS — R051 Acute cough: Secondary | ICD-10-CM

## 2021-11-02 DIAGNOSIS — J4531 Mild persistent asthma with (acute) exacerbation: Secondary | ICD-10-CM

## 2021-11-02 DIAGNOSIS — R062 Wheezing: Secondary | ICD-10-CM

## 2021-11-02 MED ORDER — AZITHROMYCIN 250 MG PO TABS: ORAL_TABLET | ORAL | 0 refills | Status: DC

## 2021-11-02 NOTE — Patient Instructions (Signed)
Stop the doxycycline.  Start taking azithromycin.  You will take 2 tablets a day when you pick it up as prescribed.  Finish the steroids.  Continue on your current allergy and asthma medications.  Stay well-hydrated.  Return in the morning for labs and a chest x-ray on the first floor of our building.  If you are getting much worse tonight then you may need to be evaluated at the emergency department, but hopefully this will not happen.

## 2021-11-02 NOTE — Progress Notes (Signed)
Subjective:  [name] is a 66 y.o. female who presents for worsening cough and shortness of breath. She has underlying asthma. Wheezing at night. States she was in to see her PCP on 10/26/2021 and has been taking Doxycycline and oral steroids. Reports using Advair, Claritin and giving herself Xolair injections. She is seeing an allergist.   Using albuterol daily now.   Denies fever, chills, dizziness, headaches, chest pain, palpitations, abdominal pain, N/V/D.    No other aggravating or relieving factors.  No other c/o.  ROS as in subjective.   Objective: Vitals:   11/02/21 1628  BP: 110/76  Pulse: 70  Temp: 97.6 F (36.4 C)  SpO2: 98%    General appearance: Alert, WD/WN, no distress, mildly ill appearing                             Skin: warm, no rash                           Head: no sinus tenderness                            Eyes: conjunctiva normal, corneas clear, PERRLA                            Ears: pearly TMs, external ear canals normal                          Nose: septum midline, no drainage             Mouth/throat: MMM, tongue normal, mild pharyngeal erythema                           Neck: supple, no adenopathy, no thyromegaly, nontender                          Heart: RRR                         Lungs: CTA bilaterally, no wheezes, rales, or rhonchi      Assessment: Acute cough - Plan: DG Chest 2 View, CBC with Differential/Platelet, azithromycin (ZITHROMAX Z-PAK) 250 MG tablet  Wheezing - Plan: DG Chest 2 View, CBC with Differential/Platelet, azithromycin (ZITHROMAX Z-PAK) 250 MG tablet  Mild persistent asthma with acute exacerbation   Plan: She does not appear to be in any acute distress.  Stop Doxycycline and start Z-pak. Continue oral steroids. Continue using allergy and asthma medications.  Suggested symptomatic OTC remedies. Return tomorrow morning for CBC and chest XR since labs and radiology area is closed now.  Follow up with results and  if not improving in the next 2-3 days.

## 2021-11-03 ENCOUNTER — Encounter: Payer: Self-pay | Admitting: Family Medicine

## 2021-11-03 ENCOUNTER — Other Ambulatory Visit (INDEPENDENT_AMBULATORY_CARE_PROVIDER_SITE_OTHER): Payer: No Typology Code available for payment source

## 2021-11-03 ENCOUNTER — Ambulatory Visit (INDEPENDENT_AMBULATORY_CARE_PROVIDER_SITE_OTHER): Payer: No Typology Code available for payment source

## 2021-11-03 DIAGNOSIS — R062 Wheezing: Secondary | ICD-10-CM

## 2021-11-03 DIAGNOSIS — R051 Acute cough: Secondary | ICD-10-CM

## 2021-11-03 LAB — CBC WITH DIFFERENTIAL/PLATELET
Basophils Absolute: 0.1 10*3/uL (ref 0.0–0.1)
Basophils Relative: 1.5 % (ref 0.0–3.0)
Eosinophils Absolute: 0.4 10*3/uL (ref 0.0–0.7)
Eosinophils Relative: 4.1 % (ref 0.0–5.0)
HCT: 42.2 % (ref 36.0–46.0)
Hemoglobin: 14.3 g/dL (ref 12.0–15.0)
Lymphocytes Relative: 31.5 % (ref 12.0–46.0)
Lymphs Abs: 3 10*3/uL (ref 0.7–4.0)
MCHC: 33.8 g/dL (ref 30.0–36.0)
MCV: 91 fl (ref 78.0–100.0)
Monocytes Absolute: 0.6 10*3/uL (ref 0.1–1.0)
Monocytes Relative: 6.6 % (ref 3.0–12.0)
Neutro Abs: 5.3 10*3/uL (ref 1.4–7.7)
Neutrophils Relative %: 56.3 % (ref 43.0–77.0)
Platelets: 251 10*3/uL (ref 150.0–400.0)
RBC: 4.64 Mil/uL (ref 3.87–5.11)
RDW: 13.3 % (ref 11.5–15.5)
WBC: 9.4 10*3/uL (ref 4.0–10.5)

## 2021-11-06 ENCOUNTER — Other Ambulatory Visit: Payer: Self-pay | Admitting: Internal Medicine

## 2021-11-06 NOTE — Telephone Encounter (Signed)
Please refill as per office routine med refill policy (all routine meds to be refilled for 3 mo or monthly (per pt preference) up to one year from last visit, then month to month grace period for 3 mo, then further med refills will have to be denied) ? ?

## 2021-11-21 ENCOUNTER — Ambulatory Visit: Payer: No Typology Code available for payment source | Admitting: Cardiology

## 2021-12-06 ENCOUNTER — Other Ambulatory Visit (INDEPENDENT_AMBULATORY_CARE_PROVIDER_SITE_OTHER): Payer: No Typology Code available for payment source

## 2021-12-06 DIAGNOSIS — E785 Hyperlipidemia, unspecified: Secondary | ICD-10-CM

## 2021-12-06 DIAGNOSIS — R7302 Impaired glucose tolerance (oral): Secondary | ICD-10-CM | POA: Diagnosis not present

## 2021-12-06 DIAGNOSIS — E559 Vitamin D deficiency, unspecified: Secondary | ICD-10-CM | POA: Diagnosis not present

## 2021-12-06 DIAGNOSIS — E538 Deficiency of other specified B group vitamins: Secondary | ICD-10-CM | POA: Diagnosis not present

## 2021-12-06 LAB — CBC WITH DIFFERENTIAL/PLATELET
Basophils Absolute: 0 10*3/uL (ref 0.0–0.1)
Basophils Relative: 0.9 % (ref 0.0–3.0)
Eosinophils Absolute: 0.1 10*3/uL (ref 0.0–0.7)
Eosinophils Relative: 2.9 % (ref 0.0–5.0)
HCT: 37 % (ref 36.0–46.0)
Hemoglobin: 12.6 g/dL (ref 12.0–15.0)
Lymphocytes Relative: 29.5 % (ref 12.0–46.0)
Lymphs Abs: 1.5 10*3/uL (ref 0.7–4.0)
MCHC: 34.1 g/dL (ref 30.0–36.0)
MCV: 91.1 fl (ref 78.0–100.0)
Monocytes Absolute: 0.4 10*3/uL (ref 0.1–1.0)
Monocytes Relative: 8.5 % (ref 3.0–12.0)
Neutro Abs: 3 10*3/uL (ref 1.4–7.7)
Neutrophils Relative %: 58.2 % (ref 43.0–77.0)
Platelets: 218 10*3/uL (ref 150.0–400.0)
RBC: 4.06 Mil/uL (ref 3.87–5.11)
RDW: 14 % (ref 11.5–15.5)
WBC: 5.1 10*3/uL (ref 4.0–10.5)

## 2021-12-06 LAB — BASIC METABOLIC PANEL
BUN: 8 mg/dL (ref 6–23)
CO2: 26 mEq/L (ref 19–32)
Calcium: 9 mg/dL (ref 8.4–10.5)
Chloride: 100 mEq/L (ref 96–112)
Creatinine, Ser: 0.69 mg/dL (ref 0.40–1.20)
GFR: 91.91 mL/min (ref >60.00)
Glucose, Bld: 99 mg/dL (ref 70–99)
Potassium: 3.6 mEq/L (ref 3.5–5.1)
Sodium: 137 mEq/L (ref 135–145)

## 2021-12-06 LAB — LIPID PANEL
Cholesterol: 105 mg/dL (ref 0–200)
HDL: 55.6 mg/dL (ref >39.00)
LDL Cholesterol: 34 mg/dL (ref 0–99)
NonHDL: 49.46
Total CHOL/HDL Ratio: 2
Triglycerides: 78 mg/dL (ref 0.0–149.0)
VLDL: 15.6 mg/dL (ref 0.0–40.0)

## 2021-12-06 LAB — HEPATIC FUNCTION PANEL
ALT: 16 U/L (ref 0–35)
AST: 17 U/L (ref 0–37)
Albumin: 3.9 g/dL (ref 3.5–5.2)
Alkaline Phosphatase: 66 U/L (ref 39–117)
Bilirubin, Direct: 0.1 mg/dL (ref 0.0–0.3)
Total Bilirubin: 0.4 mg/dL (ref 0.2–1.2)
Total Protein: 6.4 g/dL (ref 6.0–8.3)

## 2021-12-06 LAB — URINALYSIS, ROUTINE W REFLEX MICROSCOPIC
Bilirubin Urine: NEGATIVE
Hgb urine dipstick: NEGATIVE
Ketones, ur: NEGATIVE
Leukocytes,Ua: NEGATIVE
Nitrite: NEGATIVE
RBC / HPF: NONE SEEN (ref 0–?)
Specific Gravity, Urine: <=1.005 — AB (ref 1.000–1.030)
Total Protein, Urine: NEGATIVE
Urine Glucose: NEGATIVE
Urobilinogen, UA: 0.2 (ref 0.0–1.0)
WBC, UA: NONE SEEN (ref 0–?)
pH: 6.5 (ref 5.0–8.0)

## 2021-12-06 LAB — HEMOGLOBIN A1C: Hgb A1c MFr Bld: 6 % (ref 4.6–6.5)

## 2021-12-06 LAB — TSH: TSH: 0.32 u[IU]/mL — ABNORMAL LOW (ref 0.35–5.50)

## 2021-12-06 LAB — VITAMIN B12: Vitamin B-12: 328 pg/mL (ref 211–911)

## 2021-12-06 LAB — VITAMIN D 25 HYDROXY (VIT D DEFICIENCY, FRACTURES): VITD: 30.49 ng/mL (ref 30.00–100.00)

## 2021-12-09 ENCOUNTER — Ambulatory Visit: Payer: No Typology Code available for payment source | Admitting: Internal Medicine

## 2021-12-13 ENCOUNTER — Other Ambulatory Visit: Payer: Self-pay | Admitting: Internal Medicine

## 2021-12-13 ENCOUNTER — Ambulatory Visit: Payer: No Typology Code available for payment source | Admitting: Internal Medicine

## 2021-12-13 VITALS — BP 120/64 | HR 89 | Temp 97.9°F | Ht 63.0 in | Wt 193.0 lb

## 2021-12-13 DIAGNOSIS — E559 Vitamin D deficiency, unspecified: Secondary | ICD-10-CM | POA: Diagnosis not present

## 2021-12-13 DIAGNOSIS — R7302 Impaired glucose tolerance (oral): Secondary | ICD-10-CM

## 2021-12-13 DIAGNOSIS — E039 Hypothyroidism, unspecified: Secondary | ICD-10-CM | POA: Diagnosis not present

## 2021-12-13 DIAGNOSIS — E785 Hyperlipidemia, unspecified: Secondary | ICD-10-CM

## 2021-12-13 LAB — VITAMIN D 25 HYDROXY (VIT D DEFICIENCY, FRACTURES): VITD: 30.85 ng/mL (ref 30.00–100.00)

## 2021-12-13 LAB — T4, FREE: Free T4: 0.95 ng/dL (ref 0.60–1.60)

## 2021-12-13 LAB — TSH: TSH: 0.72 u[IU]/mL (ref 0.35–5.50)

## 2021-12-13 MED ORDER — LEVOTHYROXINE SODIUM 112 MCG PO TABS: 112.0000 ug | ORAL_TABLET | ORAL | 3 refills | Status: DC

## 2021-12-13 MED ORDER — LEVOTHYROXINE SODIUM 100 MCG PO TABS
100.0000 ug | ORAL_TABLET | ORAL | 3 refills | Status: DC
  Filled 2022-10-31 – 2022-11-06 (×2): qty 45, 90d supply, fill #0

## 2021-12-13 MED ORDER — ZOLPIDEM TARTRATE 10 MG PO TABS: ORAL_TABLET | ORAL | 1 refills | Status: DC

## 2021-12-13 NOTE — Assessment & Plan Note (Signed)
Lab Results  Component Value Date   LDLCALC 34 12/06/2021   Stable, pt to continue current statin crestor 40 mg qd

## 2021-12-13 NOTE — Assessment & Plan Note (Signed)
Lab Results  Component Value Date   HGBA1C 6.0 12/06/2021   Stable, pt to continue current medical treatment - diet, wt control, excercise  

## 2021-12-13 NOTE — Assessment & Plan Note (Signed)
With mild overcontrolled - for change thyroid med to 112 qod and 100 qod, f/u TFT's in 4 wks
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
## 2021-12-13 NOTE — Assessment & Plan Note (Signed)
Last vitamin D Lab Results  Component Value Date   VD25OH 30.85 12/13/2021   Low, to increase oral replacement to 2000 u qd, f/u with next labs

## 2021-12-13 NOTE — Patient Instructions (Addendum)
Please take OTC Vitamin D3 at 2000 units per day, indefinitely  Ok to change the thyroid medication to 100 mcg every other day, then 112 mcg every other day  Please continue all other medications as before, and refills have been done if requested.  Please have the pharmacy call with any other refills you may need.  Please continue your efforts at being more active, low cholesterol diet, and weight control.  Please keep your appointments with your specialists as you may have planned  Please go to the LAB at the blood drawing area for the tests to be done at 4 wks  You will be contacted by phone if any changes need to be made immediately.  Otherwise, you will receive a letter about your results with an explanation, but please check with MyChart first.  Please remember to sign up for MyChart if you have not done so, as this will be important to you in the future with finding out test results, communicating by private email, and scheduling acute appointments online when needed.  Please make an Appointment to return in 6 months, or sooner if needed, also with Lab Appointment for testing done 3-5 days before at the [facility] (so this is for TWO appointments - please see the scheduling desk as you leave)

## 2022-01-09 ENCOUNTER — Other Ambulatory Visit: Payer: Self-pay | Admitting: *Deleted

## 2022-01-09 MED ORDER — OMALIZUMAB 150 MG/ML ~~LOC~~ SOSY: 300.0000 mg | PREFILLED_SYRINGE | SUBCUTANEOUS | 11 refills | Status: DC
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
## 2022-01-17 ENCOUNTER — Other Ambulatory Visit: Payer: Self-pay | Admitting: Internal Medicine

## 2022-01-17 NOTE — Telephone Encounter (Signed)
Please refill as per office routine med refill policy (all routine meds to be refilled for 3 mo or monthly (per pt preference) up to one year from last visit, then month to month grace period for 3 mo, then further med refills will have to be denied) ? ?

## 2022-01-18 ENCOUNTER — Encounter: Payer: Self-pay | Admitting: Student

## 2022-01-18 ENCOUNTER — Ambulatory Visit: Payer: No Typology Code available for payment source | Attending: Student | Admitting: Student

## 2022-01-18 VITALS — BP 118/64 | HR 79 | Resp 20 | Ht 63.0 in | Wt 197.0 lb

## 2022-01-18 DIAGNOSIS — R002 Palpitations: Secondary | ICD-10-CM

## 2022-01-18 DIAGNOSIS — R600 Localized edema: Secondary | ICD-10-CM

## 2022-01-18 DIAGNOSIS — I471 Supraventricular tachycardia: Secondary | ICD-10-CM | POA: Diagnosis not present

## 2022-01-18 DIAGNOSIS — E785 Hyperlipidemia, unspecified: Secondary | ICD-10-CM

## 2022-01-18 DIAGNOSIS — I8393 Asymptomatic varicose veins of bilateral lower extremities: Secondary | ICD-10-CM

## 2022-01-18 DIAGNOSIS — I2510 Atherosclerotic heart disease of native coronary artery without angina pectoris: Secondary | ICD-10-CM | POA: Diagnosis not present

## 2022-01-18 NOTE — Patient Instructions (Signed)
Medication Instructions:  Increase Metoprolol 25 mg one tablet twice daily  *If you need a refill on your cardiac medications before your next appointment, please call your pharmacy*   Lab Work: NONE ordered at this time of appointment   If you have labs (blood work) drawn today and your tests are completely normal, you will receive your results only by: [place] (if you have MyChart) OR A paper copy in the mail If you have any lab test that is abnormal or we need to change your treatment, we will call you to review the results.   Testing/Procedures: NONE ordered at this time of appointment     Follow-Up: At [facility], you and your health needs are our priority.  As part of our continuing mission to provide you with exceptional heart care, we have created designated Provider Care Teams.  These Care Teams include your primary Cardiologist (physician) and Advanced Practice Providers (APPs -  Physician Assistants and Nurse Practitioners) who all work together to provide you with the care you need, when you need it.  We recommend signing up for the patient portal called "MyChart".  Sign up information is provided on this After Visit Summary.  MyChart is used to connect with patients for Virtual Visits (Telemedicine).  Patients are able to view lab/test results, encounter notes, upcoming appointments, etc.  Non-urgent messages can be sent to your provider as well.   To learn more about what you can do with MyChart, go to [email or web address].    Your next appointment:   6 month(s)  The format for your next appointment:   In Person  Provider:   Glenetta Hew, MD  or Sande Rives, PA-C        Other Instructions   Important Information About Sugar

## 2022-01-22 ENCOUNTER — Encounter: Payer: Self-pay | Admitting: Internal Medicine

## 2022-01-23 MED ORDER — NIRMATRELVIR/RITONAVIR (PAXLOVID)TABLET: 3.0000 | ORAL_TABLET | Freq: Two times a day (BID) | ORAL | 0 refills | Status: AC

## 2022-01-23 MED ORDER — ONDANSETRON 4 MG PO TBDP: 4.0000 mg | ORAL_TABLET | Freq: Three times a day (TID) | ORAL | 1 refills | Status: DC | PRN

## 2022-01-25 NOTE — Telephone Encounter (Signed)
Paxlovid was sent yesterday to her pharmacy

## 2022-01-25 NOTE — Telephone Encounter (Signed)
Patient is inquiring about paxlovid.

## 2022-02-09 ENCOUNTER — Telehealth: Payer: Self-pay | Admitting: Internal Medicine

## 2022-02-09 DIAGNOSIS — R300 Dysuria: Secondary | ICD-10-CM

## 2022-02-09 NOTE — Telephone Encounter (Signed)
PT calls today in regards to ongoing UTI like symptoms. PT has been dealing with pain during the beginning and end of urinating since 09/28. PT has not been seen by [place] regarding this issue thus far. She stated she has been taking an over the counter "Urinating Pain Relief" medication from her pharmacy.   PT was interested in urinalysis if possible through our office. I had let her know we would need an order in first from [place].  PT requested to speak with a nurse through the Triage line.  CB if needed: [phone]

## 2022-02-10 ENCOUNTER — Ambulatory Visit: Payer: No Typology Code available for payment source | Admitting: Internal Medicine

## 2022-02-10 DIAGNOSIS — E559 Vitamin D deficiency, unspecified: Secondary | ICD-10-CM

## 2022-02-10 DIAGNOSIS — R7302 Impaired glucose tolerance (oral): Secondary | ICD-10-CM

## 2022-02-10 DIAGNOSIS — R300 Dysuria: Secondary | ICD-10-CM

## 2022-02-10 LAB — URINALYSIS, ROUTINE W REFLEX MICROSCOPIC
Bilirubin Urine: NEGATIVE
Ketones, ur: NEGATIVE
Nitrite: NEGATIVE
Specific Gravity, Urine: <=1.005 — AB (ref 1.000–1.030)
Total Protein, Urine: NEGATIVE
Urine Glucose: NEGATIVE
Urobilinogen, UA: 1 (ref 0.0–1.0)
pH: 6 (ref 5.0–8.0)

## 2022-02-10 MED ORDER — TAMSULOSIN HCL 0.4 MG PO CAPS: 0.4000 mg | ORAL_CAPSULE | Freq: Every day | ORAL | 3 refills | Status: DC

## 2022-02-10 MED ORDER — CEFTRIAXONE SODIUM 1 G IJ SOLR
1.0000 g | Freq: Once | INTRAMUSCULAR | Status: AC
  Administered 2022-02-10: 1 g via INTRAMUSCULAR

## 2022-02-10 MED ORDER — CEPHALEXIN 500 MG PO CAPS: 500.0000 mg | ORAL_CAPSULE | Freq: Three times a day (TID) | ORAL | 0 refills | Status: DC

## 2022-02-10 NOTE — Telephone Encounter (Signed)
Scheduled patient appointment for 02/10/22 at 3:40.

## 2022-02-10 NOTE — Telephone Encounter (Signed)
Ok labs are ordered 
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
## 2022-02-10 NOTE — Patient Instructions (Signed)
You had the antibiotic shot today - Rocephin  Please take all new medication as prescribed - the cephalexin at 500 mg three times per day  The culture result should return in 2-3 days, and we will send a different antibiotic if needed  Please take all new medication as prescribed - the Flomax for the urinary retention  Please continue all other medications as before, and refills have been done if requested.  Please have the pharmacy call with any other refills you may need.  Please continue your efforts at being more active, low cholesterol diet, and weight control.  Please keep your appointments with your specialists as you may have planned - Urology on Nov 10

## 2022-02-11 LAB — URINE CULTURE

## 2022-02-12 ENCOUNTER — Encounter: Payer: Self-pay | Admitting: Internal Medicine

## 2022-02-12 NOTE — Assessment & Plan Note (Signed)
Last vitamin D Lab Results  Component Value Date   VD25OH 30.85 12/13/2021   Low, reminded to take oral replacement 2000 u qd

## 2022-02-12 NOTE — Assessment & Plan Note (Signed)
Lab Results  Component Value Date   HGBA1C 6.0 12/06/2021   Stable, pt to continue current medical treatment - diet, wt control, excercise  

## 2022-02-12 NOTE — Assessment & Plan Note (Signed)
Etiology unclear, possible UTI, has started keflex yesterday, but for urine cx today, and finish cephalexin without change for now.  Will have flomax 0.4 mg qd for retention.  F/u urology as planned.

## 2022-02-28 ENCOUNTER — Other Ambulatory Visit: Payer: Self-pay | Admitting: Allergy & Immunology

## 2022-03-05 ENCOUNTER — Other Ambulatory Visit: Payer: Self-pay | Admitting: Student

## 2022-03-07 ENCOUNTER — Other Ambulatory Visit: Payer: Self-pay | Admitting: Obstetrics and Gynecology

## 2022-03-07 DIAGNOSIS — Z1231 Encounter for screening mammogram for malignant neoplasm of breast: Secondary | ICD-10-CM

## 2022-03-13 ENCOUNTER — Other Ambulatory Visit: Payer: Self-pay | Admitting: Internal Medicine

## 2022-03-13 DIAGNOSIS — E559 Vitamin D deficiency, unspecified: Secondary | ICD-10-CM

## 2022-03-13 DIAGNOSIS — R7302 Impaired glucose tolerance (oral): Secondary | ICD-10-CM

## 2022-03-13 DIAGNOSIS — E785 Hyperlipidemia, unspecified: Secondary | ICD-10-CM

## 2022-03-13 DIAGNOSIS — E538 Deficiency of other specified B group vitamins: Secondary | ICD-10-CM

## 2022-04-04 ENCOUNTER — Other Ambulatory Visit: Payer: Self-pay | Admitting: Student

## 2022-04-13 ENCOUNTER — Other Ambulatory Visit: Payer: Self-pay | Admitting: Internal Medicine

## 2022-04-20 ENCOUNTER — Ambulatory Visit: Payer: No Typology Code available for payment source | Admitting: Family Medicine

## 2022-04-26 ENCOUNTER — Encounter: Payer: Self-pay | Admitting: Family Medicine

## 2022-04-26 ENCOUNTER — Ambulatory Visit: Payer: No Typology Code available for payment source | Admitting: Family Medicine

## 2022-04-26 VITALS — BP 126/64 | HR 82 | Temp 97.5°F | Ht 63.0 in | Wt 196.0 lb

## 2022-04-26 DIAGNOSIS — R222 Localized swelling, mass and lump, trunk: Secondary | ICD-10-CM | POA: Diagnosis not present
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
## 2022-04-26 NOTE — Patient Instructions (Signed)
You will hear from [facility] to schedule a visit to evaluate the area on your back.

## 2022-05-03 ENCOUNTER — Other Ambulatory Visit: Payer: Self-pay | Admitting: Allergy & Immunology

## 2022-05-07 ENCOUNTER — Other Ambulatory Visit: Payer: Self-pay | Admitting: Internal Medicine

## 2022-05-07 NOTE — Telephone Encounter (Signed)
Please refill as per office routine med refill policy (all routine meds to be refilled for 3 mo or monthly (per pt preference) up to one year from last visit, then month to month grace period for 3 mo, then further med refills will have to be denied)

## 2022-05-09 ENCOUNTER — Encounter: Payer: Self-pay | Admitting: Family

## 2022-05-09 ENCOUNTER — Other Ambulatory Visit: Payer: Self-pay

## 2022-05-09 ENCOUNTER — Ambulatory Visit: Payer: No Typology Code available for payment source | Admitting: Family

## 2022-05-09 ENCOUNTER — Other Ambulatory Visit: Payer: Self-pay | Admitting: Internal Medicine

## 2022-05-09 VITALS — BP 128/70 | HR 77 | Temp 97.6°F | Resp 16 | Ht 63.0 in | Wt 201.1 lb

## 2022-05-09 DIAGNOSIS — R21 Rash and other nonspecific skin eruption: Secondary | ICD-10-CM | POA: Diagnosis not present

## 2022-05-09 DIAGNOSIS — L508 Other urticaria: Secondary | ICD-10-CM | POA: Diagnosis not present

## 2022-05-09 DIAGNOSIS — L299 Pruritus, unspecified: Secondary | ICD-10-CM

## 2022-05-09 DIAGNOSIS — J4540 Moderate persistent asthma, uncomplicated: Secondary | ICD-10-CM | POA: Diagnosis not present

## 2022-05-09 MED ORDER — CLOBETASOL PROPIONATE 0.05 % EX CREA: TOPICAL_CREAM | CUTANEOUS | 0 refills | Status: DC

## 2022-05-09 NOTE — Progress Notes (Addendum)
[place] [address] Dept: [phone]  FOLLOW UP NOTE  Patient ID: [name], female    DOB: 1955-10-13  Age: 67 y.o. MRN: [id number] Date of Office Visit: 05/09/2022  Assessment  Chief Complaint: Allergic Reaction (Rash, hives x 2-3 wks after taking ABXs - Macrobid (12/22) then Tetracycline (12/28) prescribed by her PCP. //Patient wants to make sure she is okay to continue on her Xolair.)  HPI [name] is a 67 year old female who presents today for an acute visit of rash.  She was last seen on Aug 23, 2021 by Dr. Ernst Bowler for her intermittent asthma and pruritus with urticaria.    She reports that on April 14, 2022 she was taking Macrobid due to pain in her pelvic region and she developed hives.  She is not certain how many doses she took before the rash started.  She then was switched to another antibiotic and was given tetracycline.  She denies any concomitant gastrointestinal symptoms.  She did  however feel like she was choking.  She started taking her Advair 250/50 mcg 1 puff twice a day and this has helped with the choking sensation.  After starting the second antibiotic she reports that the rash multiplied.  The rash was itchy.  This rash would not go away, but she would continue to get more in new areas.  This rash is different from the rash she is getting Xolair injections for.  She wants to know if it is okay for her to get her next Xolair injection tomorrow.  She does her Xolair injections at home.  Her last Xolair injection was on April 26, 2022.  Prior to that her Xolair injection was on April 12, 2022.  She has been on Xolair injections since July 08, 2020.  She denies any previous problems or reactions with Xolair.  She does have an EpiPen, but is not certain how to use it.  She mentions that the rash was on her back, face, neck, and shoulders.  These areas are better now than they were.  She does feel like the Xolair injections have helped the  pruritus with urticaria.  She is currently taking hydroxyzine twice a day, Pepcid once a day, and Allegra 1 tablet twice a day.  Prior to this rash occurring she was only taking Allegra 1 tablet once a day.  She is requesting a refill of the clobetasol  Moderate intermittent asthma: Right now she is currently using Advair 250/50 mcg 1 puff twice a day since she developed the choking sensation with the rash.  Advair has helped.  She now denies cough, wheeze, tightness in chest, and shortness of breath due to breathing problems.  Since her last office visit she has not required any systemic steroids or made any trips to the emergency room or urgent care due to breathing problems.  She has used her albuterol 2 times recently with a choking sensation and it helped.  Drug Allergies:  Allergies  Allergen Reactions   Amoxicillin Shortness Of Breath and Rash    REACTION: rash, sob - tol kelfex and [facility]ceph[facility] hosp 06/2009 Has patient had a PCN reaction causing immediate rash, facial/tongue/throat swelling, SOB or lightheadedness with hypotension: YES Has patient had a PCN reaction causing severe rash involving mucus membranes or skin necrosis: YES Has patient had a PCN reaction that required hospitalization UNKNOWN Has patient had a PCN reaction occurring within the last 10 years: NO If all of the above answers are "  NO", then may proceed with Cephalosporin use   Aspirin Shortness Of Breath and Rash    Tolerates ibuprofen without issue   Latex Shortness Of Breath and Dermatitis    Blisters on skin   Sulfa Antibiotics Shortness Of Breath and Rash    But reports bactrim tolerence   Tetracyclines & Related Hives, Shortness Of Breath and Itching   Avelox [Moxifloxacin Hcl In Nacl] Hives    TOLERATES CIPRO   Diphenhydramine Palpitations   Misc. Sulfonamide Containing Compounds Rash and Hives    Review of Systems: Review of Systems  Constitutional:  Negative for chills and fever.  HENT:         Denies  rhinorrhea, nasal congestion and post nasal drip  Eyes:        Denies itchy watery eyes  Respiratory:  Negative for cough, shortness of breath and wheezing.        Denies cough, wheeze,tightness in chest, shortness of breath and nocturnal awakenings due to breathing problems now. She reports that she felt like being choked earlier while taking antibiotics  Cardiovascular:  Negative for chest pain and palpitations.  Gastrointestinal:        Reports off/on heartburn   Skin:  Positive for itching and rash.       Reports itchy rash is dwindling  Neurological:  Negative for headaches.     Physical Exam: BP 128/70   Pulse 77   Temp 97.6 F (36.4 C)   Resp 16   Ht '5\' 3"'$  (1.6 m)   Wt 201 lb 1.6 oz (91.2 kg)   SpO2 100%   BMI 35.62 kg/m    Physical Exam Constitutional:      Appearance: Normal appearance.  HENT:     Head: Normocephalic and atraumatic.     Comments: Pharynx normal. Eyes normal. Ears normal. Nose normal    Right Ear: Tympanic membrane, ear canal and external ear normal.     Left Ear: Tympanic membrane, ear canal and external ear normal.     Nose: Nose normal.     Mouth/Throat:     Mouth: Mucous membranes are moist.     Pharynx: Oropharynx is clear.  Eyes:     Conjunctiva/sclera: Conjunctivae normal.  Cardiovascular:     Rate and Rhythm: Regular rhythm.     Heart sounds: Normal heart sounds.  Pulmonary:     Effort: Pulmonary effort is normal.     Breath sounds: Normal breath sounds.     Comments: Lungs clear to auscultation Musculoskeletal:     Cervical back: Neck supple.  Skin:    General: Skin is warm.     Comments: Scaring noted on back with few scabs noted. No oozing or bleeding. No rash or urticarial lesions noted.  Neurological:     Mental Status: She is alert and oriented to person, place, and time.  Psychiatric:        Mood and Affect: Mood normal.        Behavior: Behavior normal.     Diagnostics:    Assessment and Plan: 1. Rash   2.  Chronic urticaria   3. Pruritus   4. Moderate persistent asthma without complication     Meds ordered this encounter  Medications   clobetasol cream (TEMOVATE) 0.05 %    Sig: Use 1 application sparingly once a day as needed to red itchy areas. Do not use on face, neck, groin, or armpit region. Do not use longer than 2 weeks in a rwo  Dispense:  30 g    Refill:  0    Patient Instructions  1. Moderate intermittent asthma, uncomplicated - Daily controller medication(s): NOTHING - Rescue medications: albuterol 4 puffs every 4-6 hours as needed WITH COUGHING/WHEEZING - Changes during respiratory infections or worsening symptoms: Add on Advair 250/52mg to 1 puff twice daily for TWO WEEKS TO PREVENT PREDNISONE. - Asthma control goals:  * Full participation in all desired activities (may need albuterol before activity) * Albuterol use two time or less a week on average (not counting use with activity) * Cough interfering with sleep two time or less a month * Oral steroids no more than once a year * No hospitalizations  2. Pruritus with urticaria - Continue with Xolair every two weeks for now and have access to your EpiPen. Demonstration given on how to use.  - Continue the hydroxyzine as needed. Try to decrease as this can be sedating - Continue with the following:  AM: Allegra 1-2 tablets  PM: Allegra 1-2 tablets  - Continue with the use of clobetasol once a day as needed to red itchy areas. Do not use on face, neck, groin or armpit region - Continue with Pepcid once a day for now  3. Schedule a follow up appointment in 6-8  weeks with Dr. GErnst Bowleror sooner if needed        Return in about 6 weeks (around 06/20/2022), or if symptoms worsen or fail to improve.    Thank you for the opportunity to care for this patient.  Please do not hesitate to contact me with questions.  CAlthea Charon FNP [facility]

## 2022-05-09 NOTE — Patient Instructions (Addendum)
1. Moderate intermittent asthma, uncomplicated - Daily controller medication(s): NOTHING - Rescue medications: albuterol 4 puffs every 4-6 hours as needed WITH COUGHING/WHEEZING - Changes during respiratory infections or worsening symptoms: Add on Advair 250/54mg to 1 puff twice daily for TWO WEEKS TO PREVENT PREDNISONE. - Asthma control goals:  * Full participation in all desired activities (may need albuterol before activity) * Albuterol use two time or less a week on average (not counting use with activity) * Cough interfering with sleep two time or less a month * Oral steroids no more than once a year * No hospitalizations  2. Pruritus with urticaria - Continue with Xolair every two weeks for now and have access to your EpiPen. Demonstration given on how to use.  - Continue the hydroxyzine as needed. Try to decrease as this can be sedating - Continue with the following:  AM: Allegra 1-2 tablets  PM: Allegra 1-2 tablets  - Continue with the use of clobetasol once a day as needed to red itchy areas. Do not use on face, neck, groin or armpit region - Continue with Pepcid once a day for now  3. Schedule a follow up appointment in 6-8  weeks with Dr. GErnst Bowleror sooner if needed

## 2022-05-12 ENCOUNTER — Ambulatory Visit: Payer: Self-pay | Admitting: Surgery

## 2022-05-12 NOTE — H&P (Signed)
[name] [id number]   Referring Provider:  Girtha Rm, NP   Subjective   Chief Complaint: New Consultation (Mass of skin of back)     History of Present Illness:    67 year old woman with history of anxiety, arthritis, lower extremity edema/varicose veins, coronary artery disease, degenerative disc disease, diverticulosis, GERD, hyperlipidemia, hypothyroidism, hypotonic bladder, asthma, stress incontinence, and postop nausea and vomiting presents for evaluation of a soft tissue mass on her back.  This has been present for 10 to 15 years but recently she has began to have pain in the area when she is laying down or when there is pressure to the area.  It has also increased in size slightly.  The surgical history includes vaginal hysterectomy, lumbar laminectomy/decompression microdiscectomy, laparoscopic cholecystectomy, diagnostic laparoscopy, breast excisional biopsy, and prolapse repair    Review of Systems: A complete review of systems was obtained from the patient.  I have reviewed this information and discussed as appropriate with the patient.  See HPI as well for other ROS.   Medical History: Past Medical History:  Diagnosis Date   Anemia    Anxiety    Arthritis    Asthma, unspecified asthma severity, unspecified whether complicated, unspecified whether persistent    GERD (gastroesophageal reflux disease)    Hyperlipidemia     There is no problem list on file for this patient.   Past Surgical History:  Procedure Laterality Date   Lumbar laminectomy/decompression microdiscectomy  12/18/2012   Cystoscopy/retrograde/ureteroscopy/stone extraction with basket  2015   Anterior and posterior repair with sacrospinous fixation  10/06/2019   BREAST EXCISIONAL BIOPSY     CHOLECYSTECTOMY     DIAGNOSTIC LAPAROSCOPY     HYSTERECTOMY     Nasal sinus surgery     Wisdom tooth extraction       Allergies  Allergen Reactions   Amoxicillin Rash and Shortness Of Breath     REACTION: rash, sob - tol kelfex and [facility] 06/2009  REACTION: rash, sob - tol kelfex and [facility] hosp 06/2009  Has patient had a PCN reaction causing immediate rash, facial/tongue/throat swelling, SOB or lightheadedness with hypotension: YES  Has patient had a PCN reaction causing severe rash involving mucus membranes or skin necrosis: YES  Has patient had a PCN reaction that required hospitalization UNKNOWN  Has patient had a PCN reaction occurring within the last 10 years: NO  If all of the above answers are "NO", then may proceed with Cephalosporin use   Aspirin Rash and Shortness Of Breath    Tolerates ibuprofen without issue   Sulfa (Sulfonamide Antibiotics) Hives, Rash and Shortness Of Breath    REACTION: ?rash, sob - but reports bactrim tolerence   Moxifloxacin Hives    Current Outpatient Medications on File Prior to Visit  Medication Sig Dispense Refill   levothyroxine (SYNTHROID) 100 MCG tablet Take 1 tablet by mouth every other day     levothyroxine (SYNTHROID) 112 MCG tablet TAKE 1 TABLET (112 MCG TOTAL) BY MOUTH EVERY OTHER DAY. IN ADDITION TO EVERY OTHER DAY 100 MCG     omalizumab (XOLAIR) 150 mg vial Inject subcutaneously     pantoprazole (PROTONIX) 40 MG DR tablet Take 1 tablet by mouth once daily     rosuvastatin (CRESTOR) 40 MG tablet Take 1 tablet by mouth once daily     tamsulosin (FLOMAX) 0.4 mg capsule Take 1 capsule by mouth once daily     famotidine (PEPCID) 20 MG tablet Take 20 mg by mouth 2 (  two) times daily     No current facility-administered medications on file prior to visit.    Family History  Problem Relation Age of Onset   Coronary Artery Disease (Blocked arteries around heart) Mother    Breast cancer Mother    Stroke Father    Colon cancer Father    Coronary Artery Disease (Blocked arteries around heart) Brother      Social History   Tobacco Use  Smoking Status Former   Types: Cigarettes  Smokeless Tobacco Never     Social History    Socioeconomic History   Marital status: Married  Tobacco Use   Smoking status: Former    Types: Cigarettes   Smokeless tobacco: Never  Substance and Sexual Activity   Alcohol use: Yes    Comment: social   Drug use: Never    Objective:    Vitals:   05/12/22 1110  BP: 125/77  Pulse: 102  Temp: 36.7 C (98 F)  SpO2: 97%  Weight: 88.5 kg (195 lb)  Height: 160 cm ('5\' 3"'$ )    Body mass index is 34.54 kg/m.  Gen: A&Ox3, no distress  Unlabored respirations Skin: warm and dry.  Smooth, mobile approximately 2 cm subcutaneous mass just to the right of midline in the mid upper back, mildly tender, no overlying skin changes.  Assessment and Plan:  Diagnoses and all orders for this visit:  Subcutaneous mass    Likely lipoma versus sebaceous cyst. Symptomatic with discomfort as well as increasing size.  We discussed excision and risks of bleeding, infection, pain, scarring, wound healing problems or undesired cosmetic result, recurrence of the lesion.  Questions welcomed and answered.  Will schedule at patient's convenience.  Neizan Debruhl Raquel James, MD

## 2022-05-12 NOTE — H&P (View-Only) (Signed)
[name] [id number]   Referring Provider:  Girtha Rm, NP   Subjective   Chief Complaint: New Consultation (Mass of skin of back)     History of Present Illness:    67 year old woman with history of anxiety, arthritis, lower extremity edema/varicose veins, coronary artery disease, degenerative disc disease, diverticulosis, GERD, hyperlipidemia, hypothyroidism, hypotonic bladder, asthma, stress incontinence, and postop nausea and vomiting presents for evaluation of a soft tissue mass on her back.  This has been present for 10 to 15 years but recently she has began to have pain in the area when she is laying down or when there is pressure to the area.  It has also increased in size slightly.  The surgical history includes vaginal hysterectomy, lumbar laminectomy/decompression microdiscectomy, laparoscopic cholecystectomy, diagnostic laparoscopy, breast excisional biopsy, and prolapse repair    Review of Systems: A complete review of systems was obtained from the patient.  I have reviewed this information and discussed as appropriate with the patient.  See HPI as well for other ROS.   Medical History: Past Medical History:  Diagnosis Date   Anemia    Anxiety    Arthritis    Asthma, unspecified asthma severity, unspecified whether complicated, unspecified whether persistent    GERD (gastroesophageal reflux disease)    Hyperlipidemia     There is no problem list on file for this patient.   Past Surgical History:  Procedure Laterality Date   Lumbar laminectomy/decompression microdiscectomy  12/18/2012   Cystoscopy/retrograde/ureteroscopy/stone extraction with basket  2015   Anterior and posterior repair with sacrospinous fixation  10/06/2019   BREAST EXCISIONAL BIOPSY     CHOLECYSTECTOMY     DIAGNOSTIC LAPAROSCOPY     HYSTERECTOMY     Nasal sinus surgery     Wisdom tooth extraction       Allergies  Allergen Reactions   Amoxicillin Rash and Shortness Of Breath     REACTION: rash, sob - tol kelfex and [facility] hosp 06/2009  REACTION: rash, sob - tol kelfex and roceph[facility] hosp 06/2009  Has patient had a PCN reaction causing immediate rash, facial/tongue/throat swelling, SOB or lightheadedness with hypotension: YES  Has patient had a PCN reaction causing severe rash involving mucus membranes or skin necrosis: YES  Has patient had a PCN reaction that required hospitalization UNKNOWN  Has patient had a PCN reaction occurring within the last 10 years: NO  If all of the above answers are "NO", then may proceed with Cephalosporin use   Aspirin Rash and Shortness Of Breath    Tolerates ibuprofen without issue   Sulfa (Sulfonamide Antibiotics) Hives, Rash and Shortness Of Breath    REACTION: ?rash, sob - but reports bactrim tolerence   Moxifloxacin Hives    Current Outpatient Medications on File Prior to Visit  Medication Sig Dispense Refill   levothyroxine (SYNTHROID) 100 MCG tablet Take 1 tablet by mouth every other day     levothyroxine (SYNTHROID) 112 MCG tablet TAKE 1 TABLET (112 MCG TOTAL) BY MOUTH EVERY OTHER DAY. IN ADDITION TO EVERY OTHER DAY 100 MCG     omalizumab (XOLAIR) 150 mg vial Inject subcutaneously     pantoprazole (PROTONIX) 40 MG DR tablet Take 1 tablet by mouth once daily     rosuvastatin (CRESTOR) 40 MG tablet Take 1 tablet by mouth once daily     tamsulosin (FLOMAX) 0.4 mg capsule Take 1 capsule by mouth once daily     famotidine (PEPCID) 20 MG tablet Take 20 mg by mouth 2 (  two) times daily     No current facility-administered medications on file prior to visit.    Family History  Problem Relation Age of Onset   Coronary Artery Disease (Blocked arteries around heart) Mother    Breast cancer Mother    Stroke Father    Colon cancer Father    Coronary Artery Disease (Blocked arteries around heart) Brother      Social History   Tobacco Use  Smoking Status Former   Types: Cigarettes  Smokeless Tobacco Never     Social History    Socioeconomic History   Marital status: Married  Tobacco Use   Smoking status: Former    Types: Cigarettes   Smokeless tobacco: Never  Substance and Sexual Activity   Alcohol use: Yes    Comment: social   Drug use: Never    Objective:    Vitals:   05/12/22 1110  BP: 125/77  Pulse: 102  Temp: 36.7 C (98 F)  SpO2: 97%  Weight: 88.5 kg (195 lb)  Height: 160 cm ('5\' 3"'$ )    Body mass index is 34.54 kg/m.  Gen: A&Ox3, no distress  Unlabored respirations Skin: warm and dry.  Smooth, mobile approximately 2 cm subcutaneous mass just to the right of midline in the mid upper back, mildly tender, no overlying skin changes.  Assessment and Plan:  Diagnoses and all orders for this visit:  Subcutaneous mass    Likely lipoma versus sebaceous cyst. Symptomatic with discomfort as well as increasing size.  We discussed excision and risks of bleeding, infection, pain, scarring, wound healing problems or undesired cosmetic result, recurrence of the lesion.  Questions welcomed and answered.  Will schedule at patient's convenience.  Madelaine Whipple Raquel James, MD

## 2022-05-15 ENCOUNTER — Telehealth: Payer: Self-pay | Admitting: Internal Medicine

## 2022-05-15 NOTE — Telephone Encounter (Signed)
err

## 2022-05-16 NOTE — Patient Instructions (Signed)
SURGICAL WAITING ROOM VISITATION  Patients having surgery or a procedure may have no more than 2 support people in the waiting area - these visitors may rotate.    Children under the age of 49 must have an adult with them who is not the patient.  Due to an increase in RSV and influenza rates and associated hospitalizations, children ages 33 and under may not visit patients in [place].  If the patient needs to stay at the hospital during part of their recovery, the visitor guidelines for inpatient rooms apply. Pre-op nurse will coordinate an appropriate time for 1 support person to accompany patient in pre-op.  This support person may not rotate.    Please refer to the [facility] website for the visitor guidelines for Inpatients (after your surgery is over and you are in a regular room).     Your procedure is scheduled on: 05/19/22   Report to [facility] Main Entrance    Report to admitting at 11:15 AM   Call this number if you have problems the morning of surgery [phone]   Do not eat food or drink :After Midnight.          If you have questions, please contact your surgeon's office.   FOLLOW BOWEL PREP AND ANY ADDITIONAL PRE OP INSTRUCTIONS YOU RECEIVED FROM YOUR SURGEON'S OFFICE!!!     Oral Hygiene is also important to reduce your risk of infection.                                    Remember - BRUSH YOUR TEETH THE MORNING OF SURGERY WITH YOUR REGULAR TOOTHPASTE  DENTURES WILL BE REMOVED PRIOR TO SURGERY PLEASE DO NOT APPLY "Poly grip" OR ADHESIVES!!!   Take these medicines the morning of surgery with A SIP OF WATER: Inhalers, Pepcid, Hydroxyzine, Levothyroxine, Metoprolol                               You may not have any metal on your body including hair pins, jewelry, and body piercing             Do not wear make-up, lotions, powders, perfumes, or deodorant  Do not wear nail polish including gel and S&S, artificial/acrylic nails, or any other  type of covering on natural nails including finger and toenails. If you have artificial nails, gel coating, etc. that needs to be removed by a nail salon please have this removed prior to surgery or surgery may need to be canceled/ delayed if the surgeon/ anesthesia feels like they are unable to be safely monitored.   Do not shave  48 hours prior to surgery.    Do not bring valuables to the hospital. [place].   Contacts, glasses, dentures or bridgework may not be worn into surgery.  DO NOT [place]. PHARMACY WILL DISPENSE MEDICATIONS LISTED ON YOUR MEDICATION LIST TO YOU DURING YOUR ADMISSION [place]!    Patients discharged on the day of surgery will not be allowed to drive home.  Someone NEEDS to stay with you for the first 24 hours after anesthesia.   Special Instructions: Bring a copy of your healthcare power of attorney and living will documents the  day of surgery if you haven't scanned them before.              Please read over the following fact sheets you were given: IF [place] ([phone] Schneiders   If you received a COVID test during your pre-op visit  it is requested that you wear a mask when out in public, stay away from anyone that may not be feeling well and notify your surgeon if you develop symptoms. If you test positive for Covid or have been in contact with anyone that has tested positive in the last 10 days please notify you surgeon.    [facility] - Preparing for Surgery Before surgery, you can play an important role.  Because skin is not sterile, your skin needs to be as free of germs as possible.  You can reduce the number of germs on your skin by washing with CHG (chlorahexidine gluconate) soap before surgery.  CHG is an antiseptic cleaner which kills germs and bonds with the skin to continue killing germs even after  washing. Please DO NOT use if you have an allergy to CHG or antibacterial soaps.  If your skin becomes reddened/irritated stop using the CHG and inform your nurse when you arrive at Short Stay. Do not shave (including legs and underarms) for at least 48 hours prior to the first CHG shower.  You may shave your face/neck.  Please follow these instructions carefully:  1.  Shower with CHG Soap the night before surgery and the  morning of surgery.  2.  If you choose to wash your hair, wash your hair first as usual with your normal  shampoo.  3.  After you shampoo, rinse your hair and body thoroughly to remove the shampoo.                             4.  Use CHG as you would any other liquid soap.  You can apply chg directly to the skin and wash.  Gently with a scrungie or clean washcloth.  5.  Apply the CHG Soap to your body ONLY FROM THE NECK DOWN.   Do   not use on face/ open                           Wound or open sores. Avoid contact with eyes, ears mouth and   genitals (private parts).                       Wash face,  Genitals (private parts) with your normal soap.             6.  Wash thoroughly, paying special attention to the area where your    surgery  will be performed.  7.  Thoroughly rinse your body with warm water from the neck down.  8.  DO NOT shower/wash with your normal soap after using and rinsing off the CHG Soap.                9.  Pat yourself dry with a clean towel.            10.  Wear clean pajamas.            11.  Place clean sheets on your bed the night of your first shower and do not  sleep with pets.  Day of Surgery : Do not apply any lotions/deodorants the morning of surgery.  Please wear clean clothes to the hospital/surgery center.  FAILURE TO FOLLOW THESE INSTRUCTIONS MAY RESULT IN THE CANCELLATION OF YOUR SURGERY  PATIENT SIGNATURE_________________________________  NURSE  SIGNATURE__________________________________  ________________________________________________________________________

## 2022-05-16 NOTE — Progress Notes (Signed)
COVID Vaccine Completed: yes  Date of COVID positive in last 90 days:  PCP - Cathlean Cower, MD Cardiologist - Glenetta Hew, MD  Chest x-ray - 11/03/21 Epic EKG - 10/12/21 Epic Stress Test - 2010 ECHO - 08/10/21 Epic Cardiac Cath -  Pacemaker/ICD device last checked: Spinal Cord Stimulator:  Bowel Prep -   Sleep Study -  CPAP -   Fasting Blood Sugar -  Checks Blood Sugar _____ times a day  Last dose of GLP1 agonist-  N/A GLP1 instructions:  N/A   Last dose of SGLT-2 inhibitors-  N/A SGLT-2 instructions: N/A   Blood Thinner Instructions: Aspirin Instructions: Last Dose:  Activity level:  Can go up a flight of stairs and perform activities of daily living without stopping and without symptoms of chest pain or shortness of breath.  Able to exercise without symptoms  Unable to go up a flight of stairs without symptoms of     Anesthesia review: CAD, CP, SOB, heart palpitations, asthma, cardiac clearance needed  Patient denies shortness of breath, fever, cough and chest pain at PAT appointment  Patient verbalized understanding of instructions that were given to them at the PAT appointment. Patient was also instructed that they will need to review over the PAT instructions again at home before surgery.

## 2022-05-17 ENCOUNTER — Encounter (HOSPITAL_COMMUNITY): Payer: Self-pay

## 2022-05-17 ENCOUNTER — Encounter (HOSPITAL_COMMUNITY)
Admission: RE | Admit: 2022-05-17 | Discharge: 2022-05-17 | Disposition: A | Discharge status: Home / Self Care | Payer: No Typology Code available for payment source | Source: Ambulatory Visit | Attending: Surgery | Admitting: Surgery

## 2022-05-17 VITALS — BP 118/76 | HR 75 | Temp 97.9°F | Resp 18 | Ht 63.0 in | Wt 201.1 lb

## 2022-05-17 DIAGNOSIS — Z01812 Encounter for preprocedural laboratory examination: Secondary | ICD-10-CM | POA: Diagnosis not present

## 2022-05-17 DIAGNOSIS — R931 Abnormal findings on diagnostic imaging of heart and coronary circulation: Secondary | ICD-10-CM | POA: Diagnosis not present

## 2022-05-17 HISTORY — DX: Cardiac murmur, unspecified: R01.1

## 2022-05-17 HISTORY — DX: Headache, unspecified: R51.9

## 2022-05-17 HISTORY — DX: Pneumonia, unspecified organism: J18.9

## 2022-05-17 HISTORY — DX: Personal history of urinary calculi: Z87.442

## 2022-05-17 LAB — CBC
HCT: 40 % (ref 36.0–46.0)
Hemoglobin: 13.3 g/dL (ref 12.0–15.0)
MCH: 31.1 pg (ref 26.0–34.0)
MCHC: 33.3 g/dL (ref 30.0–36.0)
MCV: 93.5 fL (ref 80.0–100.0)
Platelets: 217 10*3/uL (ref 150–400)
RBC: 4.28 MIL/uL (ref 3.87–5.11)
RDW: 12.7 % (ref 11.5–15.5)
WBC: 7.6 10*3/uL (ref 4.0–10.5)
nRBC: 0 % (ref 0.0–0.2)

## 2022-05-17 LAB — BASIC METABOLIC PANEL
Anion gap: 8 (ref 5–15)
BUN: 11 mg/dL (ref 8–23)
CO2: 24 mmol/L (ref 22–32)
Calcium: 8.8 mg/dL — ABNORMAL LOW (ref 8.9–10.3)
Chloride: 107 mmol/L (ref 98–111)
Creatinine, Ser: 0.84 mg/dL (ref 0.44–1.00)
GFR, Estimated: >60 mL/min (ref >60)
Glucose, Bld: 95 mg/dL (ref 70–99)
Potassium: 3.4 mmol/L — ABNORMAL LOW (ref 3.5–5.1)
Sodium: 139 mmol/L (ref 135–145)

## 2022-05-17 NOTE — Progress Notes (Signed)
Called patient and updated arrival time DOS 0915

## 2022-05-19 ENCOUNTER — Other Ambulatory Visit: Payer: Self-pay

## 2022-05-19 ENCOUNTER — Encounter (HOSPITAL_COMMUNITY)
Admission: RE | Disposition: A | Discharge status: Home / Self Care | Payer: Self-pay | Source: Ambulatory Visit | Attending: Surgery

## 2022-05-19 ENCOUNTER — Ambulatory Visit (HOSPITAL_COMMUNITY): Payer: No Typology Code available for payment source | Admitting: Certified Registered Nurse Anesthetist

## 2022-05-19 ENCOUNTER — Encounter (HOSPITAL_COMMUNITY): Payer: Self-pay | Admitting: Surgery

## 2022-05-19 ENCOUNTER — Ambulatory Visit (HOSPITAL_COMMUNITY)
Admission: RE | Admit: 2022-05-19 | Discharge: 2022-05-19 | Disposition: A | Discharge status: Home / Self Care | Payer: No Typology Code available for payment source | Source: Ambulatory Visit | Attending: Surgery | Admitting: Surgery

## 2022-05-19 ENCOUNTER — Ambulatory Visit (HOSPITAL_BASED_OUTPATIENT_CLINIC_OR_DEPARTMENT_OTHER): Payer: No Typology Code available for payment source | Admitting: Certified Registered Nurse Anesthetist

## 2022-05-19 DIAGNOSIS — F419 Anxiety disorder, unspecified: Secondary | ICD-10-CM | POA: Diagnosis not present

## 2022-05-19 DIAGNOSIS — R222 Localized swelling, mass and lump, trunk: Secondary | ICD-10-CM | POA: Insufficient documentation

## 2022-05-19 DIAGNOSIS — Z90710 Acquired absence of both cervix and uterus: Secondary | ICD-10-CM | POA: Diagnosis not present

## 2022-05-19 DIAGNOSIS — R931 Abnormal findings on diagnostic imaging of heart and coronary circulation: Secondary | ICD-10-CM

## 2022-05-19 DIAGNOSIS — K219 Gastro-esophageal reflux disease without esophagitis: Secondary | ICD-10-CM | POA: Insufficient documentation

## 2022-05-19 DIAGNOSIS — E039 Hypothyroidism, unspecified: Secondary | ICD-10-CM | POA: Diagnosis not present

## 2022-05-19 DIAGNOSIS — N393 Stress incontinence (female) (male): Secondary | ICD-10-CM | POA: Insufficient documentation

## 2022-05-19 DIAGNOSIS — I2510 Atherosclerotic heart disease of native coronary artery without angina pectoris: Secondary | ICD-10-CM | POA: Insufficient documentation

## 2022-05-19 DIAGNOSIS — J45909 Unspecified asthma, uncomplicated: Secondary | ICD-10-CM

## 2022-05-19 DIAGNOSIS — M1990 Unspecified osteoarthritis, unspecified site: Secondary | ICD-10-CM | POA: Diagnosis not present

## 2022-05-19 DIAGNOSIS — I8393 Asymptomatic varicose veins of bilateral lower extremities: Secondary | ICD-10-CM | POA: Diagnosis not present

## 2022-05-19 DIAGNOSIS — K5790 Diverticulosis of intestine, part unspecified, without perforation or abscess without bleeding: Secondary | ICD-10-CM | POA: Diagnosis not present

## 2022-05-19 DIAGNOSIS — Z87891 Personal history of nicotine dependence: Secondary | ICD-10-CM | POA: Insufficient documentation

## 2022-05-19 DIAGNOSIS — Z9049 Acquired absence of other specified parts of digestive tract: Secondary | ICD-10-CM | POA: Insufficient documentation

## 2022-05-19 DIAGNOSIS — N312 Flaccid neuropathic bladder, not elsewhere classified: Secondary | ICD-10-CM | POA: Diagnosis not present

## 2022-05-19 DIAGNOSIS — M5137 Other intervertebral disc degeneration, lumbosacral region: Secondary | ICD-10-CM | POA: Diagnosis not present

## 2022-05-19 DIAGNOSIS — E785 Hyperlipidemia, unspecified: Secondary | ICD-10-CM | POA: Insufficient documentation

## 2022-05-19 DIAGNOSIS — Z09 Encounter for follow-up examination after completed treatment for conditions other than malignant neoplasm: Secondary | ICD-10-CM | POA: Diagnosis not present

## 2022-05-19 DIAGNOSIS — F418 Other specified anxiety disorders: Secondary | ICD-10-CM | POA: Diagnosis not present

## 2022-05-19 HISTORY — PX: MASS EXCISION: SHX2000

## 2022-05-19 SURGERY — EXCISION MASS
Anesthesia: Monitor Anesthesia Care

## 2022-05-19 MED ORDER — HYDROMORPHONE HCL 1 MG/ML IJ SOLN: 0.2500 mg | INTRAMUSCULAR | Status: DC | PRN

## 2022-05-19 MED ORDER — BUPIVACAINE LIPOSOME 1.3 % IJ SUSP
INTRAMUSCULAR | Status: AC
  Filled 2022-05-19: qty 10

## 2022-05-19 MED ORDER — SODIUM CHLORIDE 0.9 % IV SOLN: 250.0000 mL | INTRAVENOUS | Status: DC | PRN

## 2022-05-19 MED ORDER — CHLORHEXIDINE GLUCONATE 0.12 % MT SOLN
15.0000 mL | Freq: Once | OROMUCOSAL | Status: AC
  Administered 2022-05-19: 15 mL via OROMUCOSAL

## 2022-05-19 MED ORDER — ORAL CARE MOUTH RINSE: 15.0000 mL | Freq: Once | OROMUCOSAL | Status: AC

## 2022-05-19 MED ORDER — SODIUM CHLORIDE 0.9% FLUSH: 3.0000 mL | Freq: Two times a day (BID) | INTRAVENOUS | Status: DC

## 2022-05-19 MED ORDER — DEXMEDETOMIDINE HCL IN NACL 80 MCG/20ML IV SOLN
INTRAVENOUS | Status: DC | PRN
  Administered 2022-05-19: 8 ug via BUCCAL

## 2022-05-19 MED ORDER — TRAMADOL HCL 50 MG PO TABS: 50.0000 mg | ORAL_TABLET | Freq: Four times a day (QID) | ORAL | 0 refills | Status: AC | PRN

## 2022-05-19 MED ORDER — BUPIVACAINE HCL 0.25 % IJ SOLN
INTRAMUSCULAR | Status: AC
  Filled 2022-05-19: qty 1

## 2022-05-19 MED ORDER — CHLORHEXIDINE GLUCONATE 4 % EX LIQD: 60.0000 mL | Freq: Once | CUTANEOUS | Status: DC

## 2022-05-19 MED ORDER — LACTATED RINGERS IV SOLN: INTRAVENOUS | Status: DC

## 2022-05-19 MED ORDER — PROMETHAZINE HCL 25 MG/ML IJ SOLN: 6.2500 mg | INTRAMUSCULAR | Status: DC | PRN

## 2022-05-19 MED ORDER — DEXAMETHASONE SODIUM PHOSPHATE 10 MG/ML IJ SOLN
INTRAMUSCULAR | Status: DC | PRN
  Administered 2022-05-19: 5 mg via INTRAVENOUS

## 2022-05-19 MED ORDER — CEFAZOLIN SODIUM-DEXTROSE 2-4 GM/100ML-% IV SOLN
2.0000 g | INTRAVENOUS | Status: AC
  Administered 2022-05-19: 2 g via INTRAVENOUS
  Filled 2022-05-19: qty 100

## 2022-05-19 MED ORDER — FENTANYL CITRATE (PF) 100 MCG/2ML IJ SOLN
INTRAMUSCULAR | Status: DC | PRN
  Administered 2022-05-19 (×2): 25 ug via INTRAVENOUS

## 2022-05-19 MED ORDER — ONDANSETRON HCL 4 MG/2ML IJ SOLN
INTRAMUSCULAR | Status: DC | PRN
  Administered 2022-05-19: 4 mg via INTRAVENOUS

## 2022-05-19 MED ORDER — ACETAMINOPHEN 325 MG PO TABS
ORAL_TABLET | ORAL | Status: AC
  Administered 2022-05-19: 650 mg via ORAL
  Filled 2022-05-19: qty 2

## 2022-05-19 MED ORDER — BUPIVACAINE HCL (PF) 0.25 % IJ SOLN
INTRAMUSCULAR | Status: DC | PRN
  Administered 2022-05-19: 20 mL

## 2022-05-19 MED ORDER — ACETAMINOPHEN 650 MG RE SUPP: 650.0000 mg | RECTAL | Status: DC | PRN

## 2022-05-19 MED ORDER — FENTANYL CITRATE PF 50 MCG/ML IJ SOSY: 25.0000 ug | PREFILLED_SYRINGE | INTRAMUSCULAR | Status: DC | PRN

## 2022-05-19 MED ORDER — OXYCODONE HCL 5 MG/5ML PO SOLN: 5.0000 mg | Freq: Once | ORAL | Status: DC | PRN

## 2022-05-19 MED ORDER — 0.9 % SODIUM CHLORIDE (POUR BTL) OPTIME
TOPICAL | Status: DC | PRN
  Administered 2022-05-19: 1000 mL

## 2022-05-19 MED ORDER — SODIUM CHLORIDE 0.9% FLUSH: 3.0000 mL | INTRAVENOUS | Status: DC | PRN

## 2022-05-19 MED ORDER — DEXAMETHASONE SODIUM PHOSPHATE 10 MG/ML IJ SOLN
INTRAMUSCULAR | Status: AC
  Filled 2022-05-19: qty 1

## 2022-05-19 MED ORDER — PROPOFOL 500 MG/50ML IV EMUL
INTRAVENOUS | Status: DC | PRN
  Administered 2022-05-19: 100 ug/kg/min via INTRAVENOUS

## 2022-05-19 MED ORDER — MIDAZOLAM HCL 2 MG/2ML IJ SOLN
INTRAMUSCULAR | Status: AC
  Filled 2022-05-19: qty 2

## 2022-05-19 MED ORDER — PHENYLEPHRINE 80 MCG/ML (10ML) SYRINGE FOR IV PUSH (FOR BLOOD PRESSURE SUPPORT)
PREFILLED_SYRINGE | INTRAVENOUS | Status: DC | PRN
  Administered 2022-05-19: 80 ug via INTRAVENOUS

## 2022-05-19 MED ORDER — MIDAZOLAM HCL 2 MG/2ML IJ SOLN
INTRAMUSCULAR | Status: DC | PRN
  Administered 2022-05-19: 2 mg via INTRAVENOUS

## 2022-05-19 MED ORDER — ONDANSETRON HCL 4 MG/2ML IJ SOLN
INTRAMUSCULAR | Status: AC
  Filled 2022-05-19: qty 2

## 2022-05-19 MED ORDER — OXYCODONE HCL 5 MG PO TABS: 5.0000 mg | ORAL_TABLET | Freq: Once | ORAL | Status: DC | PRN

## 2022-05-19 MED ORDER — ACETAMINOPHEN 325 MG PO TABS: 650.0000 mg | ORAL_TABLET | ORAL | Status: DC | PRN

## 2022-05-19 MED ORDER — ACETAMINOPHEN 500 MG PO TABS
1000.0000 mg | ORAL_TABLET | ORAL | Status: AC
  Administered 2022-05-19: 1000 mg via ORAL
  Filled 2022-05-19: qty 2

## 2022-05-19 MED ORDER — OXYCODONE HCL 5 MG PO TABS: 5.0000 mg | ORAL_TABLET | ORAL | Status: DC | PRN

## 2022-05-19 MED ORDER — OXYCODONE HCL 5 MG PO TABS
ORAL_TABLET | ORAL | Status: AC
  Administered 2022-05-19: 5 mg via ORAL
  Filled 2022-05-19: qty 1

## 2022-05-19 MED ORDER — FENTANYL CITRATE (PF) 100 MCG/2ML IJ SOLN
INTRAMUSCULAR | Status: AC
  Filled 2022-05-19: qty 2

## 2022-05-19 SURGICAL SUPPLY — 30 items
ADH SKN CLS APL DERMABOND .7 (GAUZE/BANDAGES/DRESSINGS) ×1
BAG COUNTER SPONGE SURGICOUNT (BAG) IMPLANT
BAG SPNG CNTER NS LX DISP (BAG)
COVER SURGICAL LIGHT HANDLE (MISCELLANEOUS) ×2 IMPLANT
DERMABOND ADVANCED .7 DNX12 (GAUZE/BANDAGES/DRESSINGS) IMPLANT
DRAPE LAPAROSCOPIC ABDOMINAL (DRAPES) IMPLANT
DRAPE LAPAROTOMY T 102X78X121 (DRAPES) IMPLANT
DRAPE LAPAROTOMY TRNSV 102X78 (DRAPES) IMPLANT
DRAPE UTILITY XL STRL (DRAPES) ×2 IMPLANT
ELECT REM PT RETURN 15FT ADLT (MISCELLANEOUS) ×2 IMPLANT
GAUZE SPONGE 4X4 12PLY STRL (GAUZE/BANDAGES/DRESSINGS) ×2 IMPLANT
GLOVE BIO SURGEON STRL SZ 6 (GLOVE) ×2 IMPLANT
GLOVE INDICATOR 6.5 STRL GRN (GLOVE) ×2 IMPLANT
GOWN STRL REUS W/ TWL LRG LVL3 (GOWN DISPOSABLE) ×2 IMPLANT
GOWN STRL REUS W/ TWL XL LVL3 (GOWN DISPOSABLE) IMPLANT
GOWN STRL REUS W/TWL LRG LVL3 (GOWN DISPOSABLE) ×1
GOWN STRL REUS W/TWL XL LVL3 (GOWN DISPOSABLE)
KIT BASIN OR (CUSTOM PROCEDURE TRAY) ×2 IMPLANT
KIT TURNOVER KIT A (KITS) IMPLANT
MARKER SKIN DUAL TIP RULER LAB (MISCELLANEOUS) IMPLANT
NEEDLE HYPO 22GX1.5 SAFETY (NEEDLE) ×2 IMPLANT
PACK GENERAL/GYN (CUSTOM PROCEDURE TRAY) ×2 IMPLANT
SPIKE FLUID TRANSFER (MISCELLANEOUS) IMPLANT
STAPLER VISISTAT 35W (STAPLE) IMPLANT
SUT MNCRL AB 4-0 PS2 18 (SUTURE) ×2 IMPLANT
SUT VIC AB 3-0 SH 27 (SUTURE) ×1
SUT VIC AB 3-0 SH 27XBRD (SUTURE) ×2 IMPLANT
SYR CONTROL 10ML LL (SYRINGE) ×2 IMPLANT
TOWEL OR 17X26 10 PK STRL BLUE (TOWEL DISPOSABLE) ×2 IMPLANT
TOWEL OR NON WOVEN STRL DISP B (DISPOSABLE) ×2 IMPLANT

## 2022-05-19 NOTE — Op Note (Signed)
Operative Note  SAVAHANNA ALMENDARIZ  [id number]  [id number]  05/19/2022   Surgeon: Romana Juniper MD FACS   Assistant: Eben Burow MD ([id number]) I was personally present during the key and critical portions of this procedure and immediately available throughout the entire procedure, as documented in my operative note.   Procedure performed: excision of subcutaneous mass 2x2cm   Preop diagnosis: subcutaneous mass Post-op diagnosis/intraop findings: same   Specimens: subcutaneous mass Retained items: no  EBL: minimal cc Complications: none   Description of procedure: After obtaining informed consent the patient was taken to the operating room and in the left lateral decubitus position with all pressure points appropriately padded.  Monitored anesthesia care was initiated,, preoperative antibiotics were administered, SCDs applied, and a formal timeout was performed.  The skin overlying the small palpable subcutaneous mass on the right upper back was prepped and usual sterile fashion.  After infiltration with local, a transverse incision was made and the soft tissue dissected with cautery until the surface of the mass was encountered.  This appeared to be a benign lipoma.  This measured approximately 2 x 2 cm.  This was excised and handed off for pathology.  Hemostasis was ensured within the wound.  The incision was closed with interrupted deep dermal 3-0 Vicryl and running subcuticular 4-0 Monocryl, followed by the application of Dermabond.  The patient was then awakened, returned to the supine position and taken to PACU in stable condition.    All counts were correct at the completion of the case.

## 2022-05-19 NOTE — Anesthesia Preprocedure Evaluation (Signed)
Anesthesia Evaluation  Patient identified by MRN, date of birth, ID band Patient awake    Reviewed: Allergy & Precautions, NPO status , Patient's Chart, lab work & pertinent test results  History of Anesthesia Complications (+) PONV and history of anesthetic complications  Airway Mallampati: I       Dental no notable dental hx. (+) Teeth Intact   Pulmonary asthma , former smoker   Pulmonary exam normal breath sounds clear to auscultation       Cardiovascular + CAD  Normal cardiovascular exam Rhythm:Regular Rate:Normal     Neuro/Psych  Headaches PSYCHIATRIC DISORDERS Anxiety Depression       GI/Hepatic ,GERD  Medicated and Controlled,,  Endo/Other  Hypothyroidism    Renal/GU      Musculoskeletal  (+) Arthritis , Osteoarthritis,    Abdominal  (+) + obese  Peds  Hematology   Anesthesia Other Findings   Reproductive/Obstetrics                             Anesthesia Physical Anesthesia Plan  ASA: 3  Anesthesia Plan: MAC   Post-op Pain Management: Minimal or no pain anticipated   Induction: Intravenous  PONV Risk Score and Plan: 3 and Ondansetron, Dexamethasone, Midazolam and Treatment may vary due to age or medical condition  Airway Management Planned: Simple Face Mask  Additional Equipment: None  Intra-op Plan:   Post-operative Plan:   Informed Consent: I have reviewed the patients History and Physical, chart, labs and discussed the procedure including the risks, benefits and alternatives for the proposed anesthesia with the patient or authorized representative who has indicated his/her understanding and acceptance.     Dental advisory given  Plan Discussed with: CRNA  Anesthesia Plan Comments:         Anesthesia Quick Evaluation

## 2022-05-19 NOTE — Anesthesia Procedure Notes (Addendum)
Procedure Name: MAC Date/Time: 05/19/2022 12:00 PM  Performed by: West Pugh, CRNAPre-anesthesia Checklist: Patient identified, Emergency Drugs available, Suction available, Patient being monitored and Timeout performed Patient Re-evaluated:Patient Re-evaluated prior to induction Oxygen Delivery Method: Simple face mask Preoxygenation: Pre-oxygenation with 100% oxygen Induction Type: IV induction Placement Confirmation: positive ETCO2 Dental Injury: Teeth and Oropharynx as per pre-operative assessment

## 2022-05-19 NOTE — Discharge Instructions (Addendum)
GENERAL SURGERY: POST OP INSTRUCTIONS  EAT Gradually transition to a high fiber diet with a fiber supplement over the next few weeks after discharge.  Start with a pureed / full liquid diet (see below)  WALK Walk an hour a day (cumulative, not all at once).  Control your pain to do that.    CONTROL PAIN Control pain so that you can walk, sleep, tolerate sneezing/coughing, go up/down stairs.  HAVE A BOWEL MOVEMENT DAILY Keep your bowels regular to avoid problems.  OK to try a laxative to override constipation.  OK to use an antidairrheal to slow down diarrhea.  Call if not better after 2 tries  CALL IF YOU HAVE PROBLEMS/CONCERNS Call if you are still struggling despite following these instructions. Call if you have concerns not answered by these instructions    DIET: Follow a light bland diet & liquids the first 24 hours after arrival home, such as soup, liquids, starches, etc.  Be sure to drink plenty of fluids.  Quickly advance to a usual solid diet within a few days.  Avoid fast food or heavy meals as your are more likely to get nauseated or have irregular bowels.  A low-sugar, high-fiber diet for the rest of your life is ideal.   Take your usually prescribed home medications unless otherwise directed. PAIN CONTROL: Pain is best controlled by a usual combination of three different methods TOGETHER: Ice/Heat Over the counter pain medication Prescription pain medication Most patients will experience some swelling and bruising around the incisions.  Ice packs or heating pads (30-60 minutes up to 6 times a day) will help. Use ice for the first few days to help decrease swelling and bruising, then switch to heat to help relax tight/sore spots and speed recovery.  Some people prefer to use ice alone, heat alone, alternating between ice & heat.  Experiment to what works for you.  Swelling and bruising can take several weeks to resolve.   It is helpful to take an over-the-counter pain  medication regularly for the first few weeks.  Choose one of the following that works best for you: Naproxen (Aleve, etc)  Two 220mg  tabs twice a day OR Ibuprofen (Advil, etc) Three 200mg  tabs four times a day (every meal & bedtime) AND Acetaminophen (Tylenol, etc) 500-650mg  four times a day (every meal & bedtime) A  prescription for pain medication (such as oxycodone, hydrocodone, etc) should be given to you upon discharge.  Take your pain medication as prescribed.  If you are having problems/concerns with the prescription medicine (does not control pain, nausea, vomiting, rash, itching, etc), please call us [phone] to see if we need to switch you to a different pain medicine that will work better for you and/or control your side effect better. If you need a refill on your pain medication, please contact your pharmacy.  They will contact our office to request authorization. Prescriptions will not be filled after 5 pm or on week-ends. Avoid getting constipated.  Between the surgery and the pain medications, it is common to experience some constipation.  Increasing fluid intake and taking a fiber supplement (such as Metamucil, Citrucel, FiberCon, MiraLax, etc) 1-2 times a day regularly will usually help prevent this problem from occurring.  A mild laxative (prune juice, Milk of Magnesia, MiraLax, etc) should be taken according to package directions if there are no bowel movements after 48 hours.   Wash / shower every day, starting 2 days after surgery.  You may shower over the  glue which is waterproof.  Continue to shower over incision(s) after the dressing is off. No rubbing, scrubbing, lotions or ointments to incision.  Glue will flake off after 1-2 weeks.  You may leave the incision open to air.  You may have skin tapes (Steri Strips) covering the incision(s).  Leave them on until one week, then remove.  You may replace a dressing/Band-Aid to cover the incision for comfort if you wish.       ACTIVITIES as tolerated:   You may resume regular (light) daily activities beginning the next day--such as daily self-care, walking, climbing stairs--gradually increasing activities as tolerated.  If you can walk 30 minutes without difficulty, it is safe to try more intense activity such as jogging, treadmill, bicycling, low-impact aerobics, swimming, etc. Save the most intensive and strenuous activity for last such as sit-ups, heavy lifting, contact sports, etc  Refrain from any heavy lifting or straining until you are off narcotics for pain control.   DO NOT PUSH THROUGH PAIN.  Let pain be your guide: If it hurts to do something, don't do it.  Pain is your body warning you to avoid that activity for another week until the pain goes down. You may drive when you are no longer taking prescription pain medication, you can comfortably wear a seatbelt, and you can safely maneuver your car and apply brakes. You may have sexual intercourse when it is comfortable.  FOLLOW UP in our office Please call [facility] at [phone] to set up an appointment to see your surgeon in the office for a follow-up appointment approximately 2-3 weeks after your surgery. Make sure that you call for this appointment the day you arrive home to insure a convenient appointment time. 9. IF YOU HAVE DISABILITY OR FAMILY LEAVE FORMS, BRING THEM TO THE OFFICE FOR PROCESSING.  DO NOT GIVE THEM TO YOUR DOCTOR.   WHEN TO CALL us [phone]: Poor pain control Reactions / problems with new medications (rash/itching, nausea, etc)  Fever over 101.5 F (38.5 C) Worsening swelling or bruising Continued bleeding from incision. Increased pain, redness, or drainage from the incision Difficulty breathing / swallowing   The clinic staff is available to answer your questions during regular business hours (8:30am-5pm).  Please don't hesitate to call and ask to speak to one of our nurses for clinical concerns.   If you have a medical  emergency, go to the nearest emergency room or call 911.  A surgeon from [facility] is always on call at the [facility], [place], [place], [place], [place]  [address] ? MAIN: [phone] ? TOLL FREE: [phone] ?  FAX [phone] [email or web address]

## 2022-05-19 NOTE — Transfer of Care (Signed)
Immediate Anesthesia Transfer of Care Note  Patient: [name]  Procedure(s) Performed: EXCISION OF SUBCUTANEOUS MASS, MID-UPPER BACK  Patient Location: PACU  Anesthesia Type:MAC  Level of Consciousness: drowsy and patient cooperative  Airway & Oxygen Therapy: Patient Spontanous Breathing and Patient connected to face mask oxygen  Post-op Assessment: Report given to RN and Post -op Vital signs reviewed and stable  Post vital signs: Reviewed and stable  Last Vitals:  Vitals Value Taken Time  BP 97/60 05/19/22 1245  Temp 36.7 C 05/19/22 1245  Pulse 68 05/19/22 1247  Resp 27 05/19/22 1247  SpO2 100 % 05/19/22 1247  Vitals shown include unvalidated device data.  Last Pain:  Vitals:   05/19/22 0944  TempSrc:   PainSc: 0-No pain         Complications: No notable events documented.

## 2022-05-19 NOTE — Anesthesia Postprocedure Evaluation (Signed)
Anesthesia Post Note  Patient: [name]  Procedure(s) Performed: EXCISION OF SUBCUTANEOUS MASS, MID-UPPER BACK     Patient location during evaluation: PACU Anesthesia Type: MAC Level of consciousness: awake and alert Pain management: pain level controlled Vital Signs Assessment: post-procedure vital signs reviewed and stable Respiratory status: spontaneous breathing, nonlabored ventilation and respiratory function stable Cardiovascular status: blood pressure returned to baseline and stable Postop Assessment: no apparent nausea or vomiting Anesthetic complications: no   No notable events documented.  Last Vitals:  Vitals:   05/19/22 1355 05/19/22 1410  BP:  106/66  Pulse: 65 66  Resp: 19 18  Temp:    SpO2: 95% 94%    Last Pain:  Vitals:   05/19/22 1330  TempSrc:   PainSc: [name]

## 2022-05-19 NOTE — Interval H&P Note (Signed)
History and Physical Interval Note:  05/19/2022 9:48 AM  [name]  has presented today for surgery, with the diagnosis of SUBCUTANEOUS MASS.  The various methods of treatment have been discussed with the patient and family. After consideration of risks, benefits and other options for treatment, the patient has consented to  Procedure(s): EXCISION OF SUBCUTANEOUS MASS, MID-UPPER BACK (N/A) as a surgical intervention.  The patient's history has been reviewed, patient examined, no change in status, stable for surgery.  I have reviewed the patient's chart and labs.  Questions were answered to the patient's satisfaction.     [name]

## 2022-05-20 ENCOUNTER — Encounter (HOSPITAL_COMMUNITY): Payer: Self-pay | Admitting: Surgery

## 2022-05-22 LAB — SURGICAL PATHOLOGY

## 2022-05-23 ENCOUNTER — Other Ambulatory Visit: Payer: Self-pay | Admitting: Obstetrics and Gynecology

## 2022-05-23 DIAGNOSIS — R102 Pelvic and perineal pain: Secondary | ICD-10-CM

## 2022-06-01 ENCOUNTER — Other Ambulatory Visit: Payer: Self-pay | Admitting: Physician Assistant

## 2022-06-01 ENCOUNTER — Ambulatory Visit
Admission: RE | Admit: 2022-06-01 | Discharge: 2022-06-01 | Disposition: A | Discharge status: Home / Self Care | Payer: No Typology Code available for payment source | Source: Ambulatory Visit | Attending: Obstetrics and Gynecology | Admitting: Obstetrics and Gynecology

## 2022-06-01 DIAGNOSIS — R102 Pelvic and perineal pain: Secondary | ICD-10-CM

## 2022-06-01 MED ORDER — IOPAMIDOL (ISOVUE-300) INJECTION 61%
100.0000 mL | Freq: Once | INTRAVENOUS | Status: AC | PRN
  Administered 2022-06-01: 100 mL via INTRAVENOUS

## 2022-06-01 NOTE — Telephone Encounter (Signed)
Patient of Dr. Harding. Please review for refill. Thank you!  

## 2022-06-02 ENCOUNTER — Ambulatory Visit
Admission: RE | Admit: 2022-06-02 | Discharge: 2022-06-02 | Disposition: A | Discharge status: Home / Self Care | Payer: No Typology Code available for payment source | Source: Ambulatory Visit | Attending: Obstetrics and Gynecology | Admitting: Obstetrics and Gynecology

## 2022-06-02 DIAGNOSIS — Z1231 Encounter for screening mammogram for malignant neoplasm of breast: Secondary | ICD-10-CM

## 2022-06-13 ENCOUNTER — Ambulatory Visit (INDEPENDENT_AMBULATORY_CARE_PROVIDER_SITE_OTHER): Payer: No Typology Code available for payment source

## 2022-06-13 ENCOUNTER — Ambulatory Visit: Payer: No Typology Code available for payment source | Admitting: Internal Medicine

## 2022-06-13 VITALS — BP 116/68 | HR 79 | Temp 98.1°F | Ht 63.0 in | Wt 198.0 lb

## 2022-06-13 DIAGNOSIS — R079 Chest pain, unspecified: Secondary | ICD-10-CM

## 2022-06-13 DIAGNOSIS — E538 Deficiency of other specified B group vitamins: Secondary | ICD-10-CM | POA: Diagnosis not present

## 2022-06-13 DIAGNOSIS — R7302 Impaired glucose tolerance (oral): Secondary | ICD-10-CM

## 2022-06-13 DIAGNOSIS — F32A Depression, unspecified: Secondary | ICD-10-CM

## 2022-06-13 DIAGNOSIS — E559 Vitamin D deficiency, unspecified: Secondary | ICD-10-CM

## 2022-06-13 DIAGNOSIS — E785 Hyperlipidemia, unspecified: Secondary | ICD-10-CM | POA: Diagnosis not present

## 2022-06-13 DIAGNOSIS — Z0001 Encounter for general adult medical examination with abnormal findings: Secondary | ICD-10-CM

## 2022-06-13 LAB — CBC WITH DIFFERENTIAL/PLATELET
Basophils Absolute: 0.1 10*3/uL (ref 0.0–0.1)
Basophils Relative: 1.1 % (ref 0.0–3.0)
Eosinophils Absolute: 0.5 10*3/uL (ref 0.0–0.7)
Eosinophils Relative: 9.3 % — ABNORMAL HIGH (ref 0.0–5.0)
HCT: 41 % (ref 36.0–46.0)
Hemoglobin: 13.8 g/dL (ref 12.0–15.0)
Lymphocytes Relative: 29.1 % (ref 12.0–46.0)
Lymphs Abs: 1.7 10*3/uL (ref 0.7–4.0)
MCHC: 33.6 g/dL (ref 30.0–36.0)
MCV: 92.1 fl (ref 78.0–100.0)
Monocytes Absolute: 0.6 10*3/uL (ref 0.1–1.0)
Monocytes Relative: 9.7 % (ref 3.0–12.0)
Neutro Abs: 2.9 10*3/uL (ref 1.4–7.7)
Neutrophils Relative %: 50.8 % (ref 43.0–77.0)
Platelets: 232 10*3/uL (ref 150.0–400.0)
RBC: 4.45 Mil/uL (ref 3.87–5.11)
RDW: 13.6 % (ref 11.5–15.5)
WBC: 5.7 10*3/uL (ref 4.0–10.5)

## 2022-06-13 LAB — BASIC METABOLIC PANEL
BUN: 17 mg/dL (ref 6–23)
CO2: 27 mEq/L (ref 19–32)
Calcium: 9.4 mg/dL (ref 8.4–10.5)
Chloride: 104 mEq/L (ref 96–112)
Creatinine, Ser: 0.86 mg/dL (ref 0.40–1.20)
GFR: 71.28 mL/min (ref >60.00)
Glucose, Bld: 85 mg/dL (ref 70–99)
Potassium: 3.9 mEq/L (ref 3.5–5.1)
Sodium: 139 mEq/L (ref 135–145)

## 2022-06-13 LAB — URINALYSIS, ROUTINE W REFLEX MICROSCOPIC
Bilirubin Urine: NEGATIVE
Hgb urine dipstick: NEGATIVE
Ketones, ur: NEGATIVE
Leukocytes,Ua: NEGATIVE
Nitrite: NEGATIVE
RBC / HPF: NONE SEEN (ref 0–?)
Specific Gravity, Urine: 1.015 (ref 1.000–1.030)
Total Protein, Urine: NEGATIVE
Urine Glucose: NEGATIVE
Urobilinogen, UA: 0.2 (ref 0.0–1.0)
pH: 6 (ref 5.0–8.0)

## 2022-06-13 LAB — VITAMIN D 25 HYDROXY (VIT D DEFICIENCY, FRACTURES): VITD: 32.53 ng/mL (ref 30.00–100.00)

## 2022-06-13 LAB — MICROALBUMIN / CREATININE URINE RATIO
Creatinine,U: 83.6 mg/dL
Microalb Creat Ratio: 1.6 mg/g (ref 0.0–30.0)
Microalb, Ur: 1.4 mg/dL (ref 0.0–1.9)

## 2022-06-13 LAB — LIPID PANEL
Cholesterol: 124 mg/dL (ref 0–200)
HDL: 57.3 mg/dL (ref >39.00)
LDL Cholesterol: 45 mg/dL (ref 0–99)
NonHDL: 66.77
Total CHOL/HDL Ratio: 2
Triglycerides: 109 mg/dL (ref 0.0–149.0)
VLDL: 21.8 mg/dL (ref 0.0–40.0)

## 2022-06-13 LAB — HEPATIC FUNCTION PANEL
ALT: 16 U/L (ref 0–35)
AST: 20 U/L (ref 0–37)
Albumin: 4.1 g/dL (ref 3.5–5.2)
Alkaline Phosphatase: 71 U/L (ref 39–117)
Bilirubin, Direct: 0 mg/dL (ref 0.0–0.3)
Total Bilirubin: 0.4 mg/dL (ref 0.2–1.2)
Total Protein: 7.2 g/dL (ref 6.0–8.3)

## 2022-06-13 LAB — TSH: TSH: 2.93 u[IU]/mL (ref 0.35–5.50)

## 2022-06-13 LAB — HEMOGLOBIN A1C: Hgb A1c MFr Bld: 5.8 % (ref 4.6–6.5)

## 2022-06-13 LAB — VITAMIN B12: Vitamin B-12: 437 pg/mL (ref 211–911)
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
## 2022-06-13 NOTE — Patient Instructions (Signed)
Please continue all other medications as before, and refills have been done if requested.  Please have the pharmacy call with any other refills you may need.  Please continue your efforts at being more active, low cholesterol diet, and weight control.  You are otherwise up to date with prevention measures today.  Please keep your appointments with your specialists as you may have planned - April 2024 cystoscopy with urology, and GYN tomorrow  Please go to the LAB at the blood drawing area for the tests to be done  You will be contacted by phone if any changes need to be made immediately.  Otherwise, you will receive a letter about your results with an explanation, but please check with MyChart first.  Please remember to sign up for MyChart if you have not done so, as this will be important to you in the future with finding out test results, communicating by private email, and scheduling acute appointments online when needed.

## 2022-06-14 ENCOUNTER — Encounter: Payer: Self-pay | Admitting: Gastroenterology

## 2022-06-15 ENCOUNTER — Encounter: Payer: Self-pay | Admitting: Internal Medicine

## 2022-06-17 ENCOUNTER — Encounter: Payer: Self-pay | Admitting: Internal Medicine

## 2022-06-17 NOTE — Assessment & Plan Note (Signed)
Age and sex appropriate education and counseling updated with regular exercise and diet Referrals for preventative services - none needed Immunizations addressed - declines covid booster, flu shot, but for shingrx at the pharmacy Smoking counseling  - none needed Evidence for depression or other mood disorder - mild recent worsening, declines start tx such as SSRI for now Most recent labs reviewed. I have personally reviewed and have noted: 1) the patient's medical and social history 2) The patient's current medications and supplements 3) The patient's height, weight, and BMI have been recorded in the chart

## 2022-06-17 NOTE — Assessment & Plan Note (Signed)
Mild worsening recent, denies SI or HI, declines further tx at this time such as SSRI or counseling referral

## 2022-06-17 NOTE — Assessment & Plan Note (Signed)
Etiology unclear, atypical and fleeting, for cxr,  to f/u any worsening symptoms or concerns

## 2022-06-17 NOTE — Assessment & Plan Note (Signed)
Last vitamin D Lab Results  Component Value Date   VD25OH 32.53 06/13/2022   Low, to start oral replacement

## 2022-06-17 NOTE — Assessment & Plan Note (Signed)
Lab Results  Component Value Date   LDLCALC 45 06/13/2022   Stable, pt to continue current statin crestor 40 mg qd

## 2022-06-23 ENCOUNTER — Other Ambulatory Visit: Payer: Self-pay | Admitting: Internal Medicine

## 2022-06-23 NOTE — Telephone Encounter (Signed)
Please refill as per office routine med refill policy (all routine meds to be refilled for 3 mo or monthly (per pt preference) up to one year from last visit, then month to month grace period for 3 mo, then further med refills will have to be denied) ? ?

## 2022-06-27 ENCOUNTER — Encounter: Payer: Self-pay | Admitting: Allergy & Immunology

## 2022-06-27 ENCOUNTER — Ambulatory Visit: Payer: No Typology Code available for payment source | Admitting: Allergy & Immunology

## 2022-06-27 ENCOUNTER — Other Ambulatory Visit: Payer: Self-pay

## 2022-06-27 VITALS — BP 122/74 | HR 76 | Temp 98.6°F | Resp 97 | Ht 63.0 in | Wt 198.9 lb

## 2022-06-27 DIAGNOSIS — R21 Rash and other nonspecific skin eruption: Secondary | ICD-10-CM

## 2022-06-27 DIAGNOSIS — J4540 Moderate persistent asthma, uncomplicated: Secondary | ICD-10-CM | POA: Diagnosis not present

## 2022-06-27 DIAGNOSIS — L508 Other urticaria: Secondary | ICD-10-CM

## 2022-06-27 NOTE — Progress Notes (Signed)
FOLLOW UP  Date of Service/Encounter:  06/27/22   Assessment:   Pruritis - with sensitization to dust mite    Chronic urticaria - doing better on Xolair every 2 weeks (administered at home)   Mild eosinophilia (AEC 500)   Intermittent asthma, uncomplicated  Plan/Recommendations:   1. Moderate intermittent asthma, uncomplicated - Lung testing not done. - I think we are doing well from from a breathing perspective.  - Daily controller medication(s): Wixela 250/'50mg'$  one puff once daily - Rescue medications: albuterol 4 puffs every 4-6 hours as needed WITH COUGHING/WHEEZING - Changes during respiratory infections or worsening symptoms: Add on Advair 250/57mg to 1 puff twice daily for TWO WEEKS TO PREVENT PREDNISONE. - Asthma control goals:  * Full participation in all desired activities (may need albuterol before activity) * Albuterol use two time or less a week on average (not counting use with activity) * Cough interfering with sleep two time or less a month * Oral steroids no more than once a year * No hospitalizations  2. Pruritus with urticaria - Hold the Xolair for now. - We are going to get you in to see Dermatology to see if we can get a biopsy to figure out what is going on. - You can continue with the antihistamines:   AM: Allegra 1-2 tablets  PM: Allegra 1-2 tablets  - Continue with the use of clobetasol as needed.  - Consider addition of Dupixent for prurigo nodularis.  3. Return in about 3 months (around 04/11/2021).   Subjective:   Isabel SCHALLHORNis a 67y.o. female presenting today for follow up of  Chief Complaint  Patient presents with   Follow-up    Isabel HIMMELMANhas a history of the following: Patient Active Problem List   Diagnosis Date Noted   Cough 10/26/2021   Wheezing 10/26/2021   Elevated coronary artery calcium score 07/30/2021   Vitamin D deficiency 06/11/2021   Dysuria 08/31/2020   Chronic pruritus 05/30/2020   Rash  04/03/2020   Chest pain 11/14/2019   Insomnia 11/14/2019   Pelvic prolapse 10/06/2019   Leg pain, bilateral 05/01/2019   Acute diverticulitis 12/23/2018   Asthma 10/02/2018   Pain in right hand 07/12/2018   Pain in both feet 11/08/2017   Nodule of chest wall 10/11/2017   Mass of right side of neck 10/11/2017   Varicose veins of bilateral lower extremities with other complications [id number]  Complication of anesthesia    Right groin pain 04/21/2017   Hyperlipidemia LDL goal <100 03/23/2017   Dorsalgia 03/01/2017   Rapid palpitations 03/01/2017   Preop cardiovascular exam 03/01/2017   Right lumbar radiculopathy 08/15/2016   Rib pain on right side 03/03/2016   Nosebleed 03/03/2016   Acute colitis 08/30/2013   GERD (gastroesophageal reflux disease) 08/14/2012   Pain and swelling of lower leg 10/23/2011   Depression 07/10/2011   History of renal stone 04/15/2011   Pleural effusion 04/14/2011   Hypokalemia 04/14/2011   Chronic low back pain 03/29/2011   Obesity 03/29/2011   Encounter for well adult exam with abnormal findings 12/30/2010   THYROID NODULE, RIGHT 02/21/2010   NECK PAIN, RIGHT 02/21/2010   Bilateral lower extremity edema 02/21/2010   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  [name] is a 67 y.o. female presenting for a follow up visit.  She was last seen in January 2024 by Althea Charon, one of our nurse practitioners.  At that time, her asthma was under good control with albuterol as needed as well as Advair added during flares.  She has a history of urticaria which was under fair control with Xolair every 2 weeks.  She was endorsing a rash on her upper back.  She was started on  Allegra 1 to 2 tablets twice a day as well as clobetasol as needed for the red itchy areas.  Since last visit, she has not really changed.   Asthma/Respiratory Symptom History: She is doing well from an asthma perspective. She is using  Wixela one puff every morning. She does sometimes forget and do this in the evening. She at least gets one puff in once daily. She has not been on prednisone at all for her symptoms. She has not been to the ED. She definitely notes an improvement with her breathing since being on the Xolair.   Skin Symptom History: She has been on the clobetasol which is not helping much. She was seeing Dr. Nevada Crane. She never actually saw Dr. Nevada Crane, just his PAs.  She thinks that the Xolair helps at times, but this is not always consistent. She is using the clobetasol intermittently. She is aggravated over the lesions and wants to get to the bottom.   She did have some recent back pain to remove a lump that she has had for years. This was benign. This was done at [facility] with Dr. Kae Heller. She had this removed at the end of January and this was thought to be a benign lipoma. This has bene present for a while but then it suddenly got larger. 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   Review of her workup from initial visit in February 2022. She had a normal tryptase, ESR, CRP, CMP, chronic urticaria panel, ANA, and alpha gal. She was sensitized to dust mites on testing in February 2022. She had testing that was positive to dust mites back in February 2022.  She is having some issues with bladder issues - she tells me that her bladder wall is thickened. She has issues with voiding uncontrollably alternating with difficulty voiding. She also reports that she has some back issues as well. She is wondering if this is all related.   Abdominal CT  IMPRESSION: 1. Questionable nonspecific mild diffuse bladder wall thickening in the  nondistended bladder. Consider correlation with urinalysis to exclude acute cystitis. 2. No evidence of bowel obstruction or acute bowel inflammation. Marked left colonic diverticulosis, with no evidence of acute diverticulitis. 3. Suggestion of diffuse hepatic steatosis. 4. Worsened bilateral lower lumbar facet arthropathy with 6 mm anterolisthesis at L4-5.  Otherwise, there have been no changes to her past medical history, surgical history, family history, or social history.    Review of Systems  Constitutional: Negative.  Negative for chills, fever, malaise/fatigue and weight loss.  HENT: Negative.  Negative for congestion, ear discharge, ear pain and sinus pain.   Eyes:  Negative for pain, discharge and redness.  Respiratory:  Negative for cough, sputum production, shortness of breath and wheezing.   Cardiovascular: Negative.  Negative for chest pain and palpitations.  Gastrointestinal:  Negative for abdominal pain, constipation, diarrhea, heartburn, nausea and vomiting.  Skin: Negative.  Negative for itching and rash.  Neurological:  Negative for dizziness and headaches.  Endo/Heme/Allergies:  Positive for environmental allergies. Does not bruise/bleed easily.  All other systems reviewed and are negative.      Objective:   Blood pressure 122/74, pulse 76, temperature 98.6 F (37 C), resp. rate (!) 97, height '5\' 3"'$  (1.6 m), weight 198 lb 14.4 oz (90.2 kg). Body mass index is 35.23 kg/m.    Physical Exam Vitals reviewed.  Constitutional:      Appearance: She is well-developed.  HENT:     Head: Normocephalic and atraumatic.     Right Ear: Tympanic membrane, ear canal and external ear normal.     Left Ear: Tympanic membrane, ear canal and external ear normal.     Nose: No nasal deformity, septal deviation, mucosal edema or rhinorrhea.     Right Turbinates: Enlarged, swollen and pale.     Left Turbinates: Enlarged, swollen and pale.     Right Sinus: No maxillary sinus  tenderness or frontal sinus tenderness.     Left Sinus: No maxillary sinus tenderness or frontal sinus tenderness.     Comments: No nasal polyps.    Mouth/Throat:     Mouth: Mucous membranes are not pale and not dry.     Pharynx: Uvula midline.  Eyes:     General: Lids are normal. No allergic shiner.       Right eye: No discharge.        Left eye: No discharge.     Conjunctiva/sclera: Conjunctivae normal.     Right eye: Right conjunctiva is not injected. No chemosis.    Left eye: Left conjunctiva is not injected. No chemosis.    Pupils: Pupils are equal, round, and reactive to light.  Cardiovascular:     Rate and Rhythm: Normal rate and regular rhythm.     Heart sounds: Normal heart sounds.  Pulmonary:     Effort: Pulmonary effort is normal. No tachypnea, accessory muscle usage or respiratory distress.     Breath sounds: Normal breath sounds. No wheezing, rhonchi or rales.     Comments: Moving air well in all lung fields. Chest:     Chest wall: No tenderness.  Lymphadenopathy:     Cervical: No cervical adenopathy.  Skin:    General: Skin is warm.     Capillary Refill: Capillary refill takes less than 2 seconds.     Coloration: Skin is not pale.     Findings: Lesion present. No abrasion, erythema, petechiae or rash. Rash is not papular, urticarial or vesicular.     Comments: Overall, skin looks much better.  She does have some residual hyperpigmented lesions with some excoriations, but it certainly is not as bad as when I have seen her in the past.  Neurological:     Mental Status: She is alert.  Psychiatric:        Behavior: Behavior is cooperative.      Diagnostic studies: none      Salvatore Marvel, MD  Allergy and [facility]

## 2022-06-27 NOTE — Patient Instructions (Addendum)
1. Moderate intermittent asthma, uncomplicated - Lung testing not done. - I think we are doing well from from a breathing perspective.  - Daily controller medication(s): Wixela 250/'50mg'$  one puff once daily - Rescue medications: albuterol 4 puffs every 4-6 hours as needed WITH COUGHING/WHEEZING - Changes during respiratory infections or worsening symptoms: Add on Advair 250/68mg to 1 puff twice daily for TWO WEEKS TO PREVENT PREDNISONE. - Asthma control goals:  * Full participation in all desired activities (may need albuterol before activity) * Albuterol use two time or less a week on average (not counting use with activity) * Cough interfering with sleep two time or less a month * Oral steroids no more than once a year * No hospitalizations  2. Pruritus with urticaria - Hold the Xolair for now. - We are going to get you in to see Dermatology to see if we can get a biopsy to figure out what is going on. - You can continue with the antihistamines:   AM: Allegra 1-2 tablets  PM: Allegra 1-2 tablets  - Continue with the use of clobetasol as needed.  - Consider addition of Dupixent for prurigo nodularis.  3. Return in about 3 months (around 04/11/2021).    Please inform [place] any Emergency Department visits, hospitalizations, or changes in symptoms. Call [place]efore going to the ED for breathing or allergy symptoms since we might be able to fit you in for a sick visit. Feel free to contact u[place]anytime with any questions, problems, or concerns.  It was a pleasure to see you again today!  Websites that have reliable patient information: 1. American Academy of Asthma, Allergy, and Immunology: www.aaaai.org 2. Food Allergy Research and Education (FARE): foodallergy.org 3. Mothers of Asthmatics: http://www.asthmacommunitynetwork.org 4. American College of Allergy, Asthma, and Immunology: www.acaai.org   COVID-19 Vaccine Information can be found at:  [email or web address] questions related to vaccine distribution or appointments, please email [email or web address] or call [phone]   We realize that you might be concerned about having an allergic reaction to the COVID19 vaccines. To help with that concern, WE ARE OFFERING THE COVID19 VACCINES IN OUR OFFICE! Ask the front desk for dates!     "Like" [facility] Facebook and Instagram for our latest updates!      A healthy democracy works best when ANew York Life Insuranceparticipate! Make sure you are registered to vote! If you have moved or changed any of your contact information, you will need to get this updated before voting!  In some cases, you MAY be able to register to vote online: [email or web address]

## 2022-06-30 ENCOUNTER — Other Ambulatory Visit: Payer: Self-pay | Admitting: Family

## 2022-07-14 ENCOUNTER — Telehealth: Payer: Self-pay | Admitting: Internal Medicine

## 2022-07-14 ENCOUNTER — Encounter: Payer: Self-pay | Admitting: Family Medicine

## 2022-07-14 ENCOUNTER — Ambulatory Visit: Payer: No Typology Code available for payment source | Admitting: Physician Assistant

## 2022-07-14 ENCOUNTER — Ambulatory Visit: Payer: No Typology Code available for payment source | Admitting: Family Medicine

## 2022-07-14 VITALS — BP 110/60 | HR 77 | Temp 98.5°F | Ht 63.0 in | Wt 195.4 lb

## 2022-07-14 DIAGNOSIS — R051 Acute cough: Secondary | ICD-10-CM

## 2022-07-14 DIAGNOSIS — U071 COVID-19: Secondary | ICD-10-CM | POA: Diagnosis not present

## 2022-07-14 LAB — POC COVID19 BINAXNOW: SARS Coronavirus 2 Ag: POSITIVE — AB

## 2022-07-14 MED ORDER — MOLNUPIRAVIR EUA 200MG CAPSULE: 4.0000 | ORAL_CAPSULE | Freq: Two times a day (BID) | ORAL | 0 refills | Status: AC

## 2022-07-14 MED ORDER — BENZONATATE 100 MG PO CAPS: 100.0000 mg | ORAL_CAPSULE | Freq: Two times a day (BID) | ORAL | 0 refills | Status: DC | PRN

## 2022-07-14 NOTE — Progress Notes (Signed)
Established Patient Office Visit   Subjective  Patient ID: [name], female    DOB: 1955/07/03  Age: 67 y.o. MRN: [id number]  Chief Complaint  Patient presents with   Cough    Sx started this Tuesday night. Head congestion. Coughing up yellow phelgms. Taking mucinex    Pt is a 67yo female with  has a past medical history of Anxiety, Arthritis, Bilateral lower extremity edema, CAD, DDD, Diverticulosis of colon, GERD, Headache, Heart murmur, Heart palpitations, History of iron deficiency anemia, History of kidney stones, Hyperlipidemia, Hypothyroidism, Hypotonic bladder, Mild persistent asthma, stress urinary incontinence, and Varicose veins of both lower extremities followed by Dr. Jenny Reichmann and seen for acute concern.    Patient endorses productive cough and nasal congestion x 3 days.  Patient states head feels tight.  Denies fever, chills, sore throat, nausea, vomiting, ear pain/pressure.  Tried Mucinex for symptoms.  Using albuterol inhaler feels mildly better today.  States missed Xolair injections 1 week ago.   Cough      Review of Systems  Respiratory:  Positive for cough.    Negative unless stated above    Objective:     BP 110/60 (BP Location: Right Arm, Patient Position: Sitting, Cuff Size: Large)   Pulse 77   Temp 98.5 F (36.9 C) (Oral)   Ht 5\' 3"  (1.6 m)   Wt 195 lb 6.4 oz (88.6 kg)   SpO2 99%   BMI 34.61 kg/m    Physical Exam Constitutional:      General: She is not in acute distress.    Appearance: Normal appearance.  HENT:     Head: Normocephalic and atraumatic.     Right Ear: Hearing and tympanic membrane normal.     Left Ear: Hearing and tympanic membrane normal.     Nose:     Right Turbinates: Enlarged.     Left Turbinates: Enlarged.     Right Sinus: Maxillary sinus tenderness present.     Left Sinus: Maxillary sinus tenderness present.     Mouth/Throat:     Mouth: Mucous membranes are moist.     Tonsils: No tonsillar exudate.  Eyes:      Extraocular Movements: Extraocular movements intact.     Conjunctiva/sclera: Conjunctivae normal.     Pupils: Pupils are equal, round, and reactive to light.  Cardiovascular:     Rate and Rhythm: Normal rate and regular rhythm.     Heart sounds: Normal heart sounds. No murmur heard.    No gallop.  Pulmonary:     Effort: Pulmonary effort is normal. No respiratory distress.     Breath sounds: Normal breath sounds. No wheezing, rhonchi or rales.     Comments: cough Skin:    General: Skin is warm and dry.  Neurological:     Mental Status: She is alert and oriented to person, place, and time.      No results found for any visits on 07/14/22.    Assessment & Plan:  COVID-19 virus infection -     molnupiravir EUA; Take 4 capsules (800 mg total) by mouth 2 (two) times daily for 5 days.  Dispense: 40 capsule; Refill: 0  Acute cough -     POC COVID-19 BinaxNow -     Benzonatate; Take 1 capsule (100 mg total) by mouth 2 (two) times daily as needed for cough.  Dispense: 20 capsule; Refill: 0  POC testing positive for COVID.  Discussed r/b/a of antiviral med.  Pt wishes to start  Molnupiravir.  Continue supportive care.  Given strict precautions.  Return if symptoms worsen or fail to improve.   Billie Ruddy, MD

## 2022-07-14 NOTE — Telephone Encounter (Signed)
Pt was seen by Dr. Volanda Napoleon on 07/14/22.  Pt called to ask if she can continue taking the  cephALEXin (KEFLEX) 250 MG capsule along with the molnupiravir EUA (LAGEVRIO) 200 mg CAPS capsule  and the benzonatate (TESSALON) 100 MG capsule ?  Please advise.

## 2022-07-20 NOTE — Telephone Encounter (Signed)
OK to take.  Not sure where the Keflex came from as it was not prescribed by this provider.

## 2022-07-24 NOTE — Telephone Encounter (Signed)
Relay message ok to take to pt. Pt states she did took it. No further action needed.

## 2022-08-03 ENCOUNTER — Other Ambulatory Visit (HOSPITAL_COMMUNITY): Payer: Self-pay

## 2022-08-03 MED ORDER — IMVEXXY MAINTENANCE PACK 4 MCG VA INST
VAGINAL_INSERT | VAGINAL | 2 refills | Status: DC
  Filled 2022-08-11: qty 8, 28d supply, fill #0
  Filled 2022-09-08: qty 8, 28d supply, fill #1
  Filled 2022-10-06: qty 8, 28d supply, fill #2
  Filled 2022-11-06: qty 8, 28d supply, fill #3
  Filled 2022-11-07: qty 8, 24d supply, fill #3
  Filled 2023-02-26: qty 8, 24d supply, fill #4

## 2022-08-11 ENCOUNTER — Other Ambulatory Visit (HOSPITAL_COMMUNITY): Payer: Self-pay

## 2022-08-14 ENCOUNTER — Other Ambulatory Visit (HOSPITAL_COMMUNITY): Payer: Self-pay

## 2022-08-15 ENCOUNTER — Other Ambulatory Visit: Payer: Self-pay

## 2022-08-15 DIAGNOSIS — E785 Hyperlipidemia, unspecified: Secondary | ICD-10-CM

## 2022-08-15 DIAGNOSIS — Z79899 Other long term (current) drug therapy: Secondary | ICD-10-CM

## 2022-08-15 DIAGNOSIS — I2510 Atherosclerotic heart disease of native coronary artery without angina pectoris: Secondary | ICD-10-CM

## 2022-08-16 ENCOUNTER — Encounter: Payer: Self-pay | Admitting: Dermatology

## 2022-08-16 ENCOUNTER — Ambulatory Visit: Payer: No Typology Code available for payment source | Admitting: Dermatology

## 2022-08-16 VITALS — BP 100/68

## 2022-08-16 DIAGNOSIS — D225 Melanocytic nevi of trunk: Secondary | ICD-10-CM

## 2022-08-16 DIAGNOSIS — X32XXXA Exposure to sunlight, initial encounter: Secondary | ICD-10-CM

## 2022-08-16 DIAGNOSIS — W57XXXA Bitten or stung by nonvenomous insect and other nonvenomous arthropods, initial encounter: Secondary | ICD-10-CM

## 2022-08-16 DIAGNOSIS — D1801 Hemangioma of skin and subcutaneous tissue: Secondary | ICD-10-CM

## 2022-08-16 DIAGNOSIS — Z1283 Encounter for screening for malignant neoplasm of skin: Secondary | ICD-10-CM | POA: Diagnosis not present

## 2022-08-16 DIAGNOSIS — S40861A Insect bite (nonvenomous) of right upper arm, initial encounter: Secondary | ICD-10-CM

## 2022-08-16 DIAGNOSIS — L578 Other skin changes due to chronic exposure to nonionizing radiation: Secondary | ICD-10-CM

## 2022-08-16 DIAGNOSIS — L821 Other seborrheic keratosis: Secondary | ICD-10-CM | POA: Diagnosis not present

## 2022-08-16 DIAGNOSIS — L814 Other melanin hyperpigmentation: Secondary | ICD-10-CM

## 2022-08-16 DIAGNOSIS — S50861A Insect bite (nonvenomous) of right forearm, initial encounter: Secondary | ICD-10-CM

## 2022-08-16 DIAGNOSIS — W908XXA Exposure to other nonionizing radiation, initial encounter: Secondary | ICD-10-CM

## 2022-08-16 LAB — HEPATIC FUNCTION PANEL
ALT: 19 IU/L (ref 0–32)
AST: 27 IU/L (ref 0–40)
Albumin: 4.1 g/dL (ref 3.9–4.9)
Alkaline Phosphatase: 78 IU/L (ref 44–121)
Bilirubin Total: 0.3 mg/dL (ref 0.0–1.2)
Bilirubin, Direct: <0.1 mg/dL (ref 0.00–0.40)
Total Protein: 6.3 g/dL (ref 6.0–8.5)

## 2022-08-16 LAB — LIPID PANEL
Chol/HDL Ratio: 2.1 ratio (ref 0.0–4.4)
Cholesterol, Total: 120 mg/dL (ref 100–199)
HDL: 56 mg/dL (ref >39)
LDL Chol Calc (NIH): 47 mg/dL (ref 0–99)
Triglycerides: 91 mg/dL (ref 0–149)
VLDL Cholesterol Cal: 17 mg/dL (ref 5–40)

## 2022-08-16 NOTE — Progress Notes (Signed)
   New Patient Visit   Subjective  [name] is a 67 y.o. female who presents for the following: Skin Cancer Screening and Full Body Skin Exam. She has no personal history of skin cancer. She complains of bug bites on the right arm and back. She uses Clobetasol cream as needed given by PCP. She does pick at them.   The patient presents for Total-Body Skin Exam (TBSE) for skin cancer screening and mole check. The patient has spots, moles and lesions to be evaluated, some may be new or changing and the patient has concerns that these could be cancer.    The following portions of the chart were reviewed this encounter and updated as appropriate: medications, allergies, medical history  Review of Systems:  No other skin or systemic complaints except as noted in HPI or Assessment and Plan.  Objective  Well appearing patient in no apparent distress; mood and affect are within normal limits.  A full examination was performed including scalp, head, eyes, ears, nose, lips, neck, chest, axillae, abdomen, back, buttocks, bilateral upper extremities, bilateral lower extremities, hands, feet, fingers, toes, fingernails, and toenails. All findings within normal limits unless otherwise noted below.   Relevant physical exam findings are noted in the Assessment and Plan.    Assessment & Plan   LENTIGINES, SEBORRHEIC KERATOSES, HEMANGIOMAS - Benign normal skin lesions - Benign-appearing - Call for any changes  MELANOCYTIC NEVI - Tan-brown and/or pink-flesh-colored symmetric macules and papules - Benign appearing on exam today - Observation - Call clinic for new or changing moles - Recommend daily use of broad spectrum spf 30+ sunscreen to sun-exposed areas.   ACTINIC DAMAGE - Chronic condition, secondary to cumulative UV/sun exposure - diffuse scaly erythematous macules with underlying dyspigmentation - Recommend daily broad spectrum sunscreen SPF 30+ to sun-exposed areas, reapply every  2 hours as needed.  - Staying in the shade or wearing long sleeves, sun glasses (UVA+UVB protection) and wide brim hats (4-inch brim around the entire circumference of the hat) are also recommended for sun protection.  - Call for new or changing lesions.   ARTHROPOD BITES Exam: pink papules with scabbed excoriations   I recommended the following: Cleansers (Cetaphil, Cerave, Neutrogena) Moisturizers (Cetaphil, CeraVe Neutrogena)  Samples of Cerave anti-itch lotion given.  Patient acknowledges that there are ants and other "long bugs" in her home due to recent construction.  She sees them biting her.  The papules on her arms and trunk are consistent with the clinical appearance of Bite marks and are scabbed and healing well.  Pt states that she's concerned that her body is not healing after the bites and she wants a bx to determine why her skin is not healing.   However, there are no "unhealed" lesions to biopsy today.  I explained to pt that the only way to prevent new lesions is to exterminate the ants in her home that are biting her.    Recommend she continue Clobetasol BID for up to 2 weeks at at time and mix with CeraVe healing Ointment to promote faster healing.  She can also restart collagen supplements pending the approval of her nephrologist.   SKIN CANCER SCREENING PERFORMED TODAY.      No follow-ups on file.  Jaclynn Guarneri, CMA, am acting as scribe for Langston Reusing, MD.   Documentation: I have reviewed the above documentation for accuracy and completeness, and I agree with the above.  Langston Reusing, MD

## 2022-08-16 NOTE — Patient Instructions (Signed)
    Due to recent changes in healthcare laws, you may see results of your pathology and/or laboratory studies on MyChart before the doctors have had a chance to review them. We understand that in some cases there may be results that are confusing or concerning to you. Please understand that not all results are received at the same time and often the doctors may need to interpret multiple results in order to provide you with the best plan of care or course of treatment. Therefore, we ask that you please give us 2 business days to thoroughly review all your results before contacting the office for clarification. Should we see a critical lab result, you will be contacted sooner.   If You Need Anything After Your Visit  If you have any questions or concerns for your doctor, please call our main line at [phone] If no one answers, please leave a voicemail as directed and we will return your call as soon as possible. Messages left after 4 pm will be answered the following business day.   You may also send us a message via MyChart. We typically respond to MyChart messages within 1-2 business days.  For prescription refills, please ask your pharmacy to contact our office. Our fax number is [phone].  If you have an urgent issue when the clinic is closed that cannot wait until the next business day, you can page your doctor at the number below.    Please note that while we do our best to be available for urgent issues outside of office hours, we are not available 24/7.   If you have an urgent issue and are unable to reach us, you may choose to seek medical care at your doctor's office, retail clinic, urgent care center, or emergency room.  If you have a medical emergency, please immediately call 911 or go to the emergency department. In the event of inclement weather, please call our main line at [phone] for an update on the status of any delays or closures.  Dermatology Medication  Tips: Please keep the boxes that topical medications come in in order to help keep track of the instructions about where and how to use these. Pharmacies typically print the medication instructions only on the boxes and not directly on the medication tubes.   If your medication is too expensive, please contact our office at [phone] or send us a message through MyChart.   We are unable to tell what your co-pay for medications will be in advance as this is different depending on your insurance coverage. However, we may be able to find a substitute medication at lower cost or fill out paperwork to get insurance to cover a needed medication.   If a prior authorization is required to get your medication covered by your insurance company, please allow us 1-2 business days to complete this process.  Drug prices often vary depending on where the prescription is filled and some pharmacies may offer cheaper prices.  The website www.goodrx.com contains coupons for medications through different pharmacies. The prices here do not account for what the cost may be with help from insurance (it may be cheaper with your insurance), but the website can give you the price if you did not use any insurance.  - You can print the associated coupon and take it with your prescription to the pharmacy.  - You may also stop by our office during regular business hours and pick up a GoodRx coupon card.  -   If you need your prescription sent electronically to a different pharmacy, notify our office through [facility] MyChart or by phone at [phone]    Skin Education :   I counseled the patient regarding the following: Sun screen (SPF 30 or greater) should be applied during peak UV exposure (between 10am and 2pm) and reapplied after exercise or swimming.  The ABCDEs of melanoma were reviewed with the patient, and the importance of monthly self-examination of moles was emphasized. Should any moles change in shape or  color, or itch, bleed or burn, pt will contact our office for evaluation sooner then their interval appointment.  Plan: Sunscreen Recommendations I recommended a broad spectrum sunscreen with a SPF of 30 or higher. I explained that SPF 30 sunscreens block approximately 97 percent of the sun's harmful rays. Sunscreens should be applied at least 15 minutes prior to expected sun exposure and then every 2 hours after that as long as sun exposure continues. If swimming or exercising sunscreen should be reapplied every 45 minutes to an hour after getting wet or sweating. One ounce, or the equivalent of a shot glass full of sunscreen, is adequate to protect the skin not covered by a bathing suit. I also recommended a lip balm with a sunscreen as well. Sun protective clothing can be used in lieu of sunscreen but must be worn the entire time you are exposed to the sun's rays.  

## 2022-08-22 ENCOUNTER — Encounter: Payer: Self-pay | Admitting: Cardiology

## 2022-08-22 ENCOUNTER — Ambulatory Visit: Payer: No Typology Code available for payment source | Attending: Cardiology | Admitting: Cardiology

## 2022-08-22 VITALS — BP 106/60 | HR 68 | Ht 63.0 in | Wt 197.8 lb

## 2022-08-22 DIAGNOSIS — I83893 Varicose veins of bilateral lower extremities with other complications: Secondary | ICD-10-CM

## 2022-08-22 DIAGNOSIS — R931 Abnormal findings on diagnostic imaging of heart and coronary circulation: Secondary | ICD-10-CM

## 2022-08-22 DIAGNOSIS — E785 Hyperlipidemia, unspecified: Secondary | ICD-10-CM | POA: Diagnosis not present

## 2022-08-22 DIAGNOSIS — R600 Localized edema: Secondary | ICD-10-CM

## 2022-08-22 DIAGNOSIS — R002 Palpitations: Secondary | ICD-10-CM

## 2022-08-22 MED ORDER — METOPROLOL TARTRATE 25 MG PO TABS: 25.0000 mg | ORAL_TABLET | Freq: Two times a day (BID) | ORAL | 3 refills | Status: DC
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
## 2022-08-22 NOTE — Patient Instructions (Signed)
Medication Instructions:   Metoprolol tartrate 25 mg one tablet twice a day -   may take as discussed -   *If you need a refill on your cardiac medications before your next appointment, please call your pharmacy*   Lab Work: Not needed    Testing/Procedures:  Not needed  Follow-Up: At [facility], you and your health needs are our priority.  As part of our continuing mission to provide you with exceptional heart care, we have created designated Provider Care Teams.  These Care Teams include your primary Cardiologist (physician) and Advanced Practice Providers (APPs -  Physician Assistants and Nurse Practitioners) who all work together to provide you with the care you need, when you need it.     Your next appointment:   12 month(s)  The format for your next appointment:   In Person  Provider:   Marjie Skiff, PA-C    Then, Bryan Lemma, MD will plan to see you again in 24 month(s).

## 2022-08-23 ENCOUNTER — Telehealth: Payer: Self-pay | Admitting: Allergy & Immunology

## 2022-08-23 ENCOUNTER — Other Ambulatory Visit (HOSPITAL_COMMUNITY): Payer: Self-pay

## 2022-08-23 NOTE — Telephone Encounter (Signed)
Patient called and stated she needs a refill on the medication omalizumab omalizumab Geoffry Paradise) 150 MG/ML prefilled syringe sent to [facility] - [facility], [place] - [address] (Ph: [phone])

## 2022-08-23 NOTE — Telephone Encounter (Signed)
Error

## 2022-08-24 ENCOUNTER — Other Ambulatory Visit (HOSPITAL_COMMUNITY): Payer: Self-pay

## 2022-08-24 ENCOUNTER — Other Ambulatory Visit: Payer: Self-pay | Admitting: *Deleted

## 2022-08-24 MED ORDER — OMALIZUMAB 150 MG/ML ~~LOC~~ SOSY: 300.0000 mg | PREFILLED_SYRINGE | SUBCUTANEOUS | 11 refills | Status: DC

## 2022-08-24 MED ORDER — OMALIZUMAB 150 MG/ML ~~LOC~~ SOSY
300.0000 mg | PREFILLED_SYRINGE | SUBCUTANEOUS | 3 refills | Status: DC
  Filled 2022-08-25 – 2022-11-06 (×3): qty 4, 28d supply, fill #0

## 2022-08-24 NOTE — Telephone Encounter (Signed)
Rx sent 

## 2022-08-25 ENCOUNTER — Other Ambulatory Visit (HOSPITAL_COMMUNITY): Payer: Self-pay

## 2022-08-28 ENCOUNTER — Other Ambulatory Visit (HOSPITAL_COMMUNITY): Payer: Self-pay

## 2022-09-02 ENCOUNTER — Encounter: Payer: Self-pay | Admitting: Cardiology

## 2022-09-02 NOTE — Assessment & Plan Note (Signed)
Still having some spells.   Seen with father  ID increased 25 mg twice daily of Lopressor.

## 2022-09-02 NOTE — Assessment & Plan Note (Signed)
[name] will have the procedure done.  And Lasix.  Also support recommended for elevation.  The swelling is not related to heart failure and there is no PND orthopnea.

## 2022-09-02 NOTE — Assessment & Plan Note (Signed)
On statin (rosuvastatin 40 mg).  Lipids well-controlled.  No change

## 2022-09-02 NOTE — Assessment & Plan Note (Signed)
Mildly elevated Coronary Calcium Score with only mild disease in the LAD.  Warrants treatment of lipids and other risk factors, but low risk.  Plan: Lipids are well-controlled on rosuvastatin 40 mg. We are now upping metoprolol to 25 mg twice daily. Calcium score: Studies: Recommendation.  Continue to stay active and eat healthy

## 2022-09-08 ENCOUNTER — Other Ambulatory Visit (HOSPITAL_COMMUNITY): Payer: Self-pay

## 2022-09-28 ENCOUNTER — Ambulatory Visit: Payer: No Typology Code available for payment source | Admitting: Allergy & Immunology

## 2022-09-28 ENCOUNTER — Encounter: Payer: Self-pay | Admitting: Allergy & Immunology

## 2022-09-28 ENCOUNTER — Other Ambulatory Visit: Payer: Self-pay

## 2022-09-28 VITALS — BP 118/72 | HR 72 | Temp 97.6°F | Resp 16 | Wt 196.2 lb

## 2022-09-28 DIAGNOSIS — R21 Rash and other nonspecific skin eruption: Secondary | ICD-10-CM | POA: Diagnosis not present

## 2022-09-28 DIAGNOSIS — L508 Other urticaria: Secondary | ICD-10-CM

## 2022-09-28 DIAGNOSIS — L299 Pruritus, unspecified: Secondary | ICD-10-CM

## 2022-09-28 DIAGNOSIS — J4540 Moderate persistent asthma, uncomplicated: Secondary | ICD-10-CM | POA: Diagnosis not present

## 2022-09-28 MED ORDER — CLOBETASOL PROPIONATE 0.05 % EX CREA
TOPICAL_CREAM | CUTANEOUS | 2 refills | Status: DC
  Filled 2022-10-31: qty 30, 20d supply, fill #0
  Filled 2022-11-06: qty 30, 15d supply, fill #0

## 2022-09-28 NOTE — Progress Notes (Signed)
Please refer patient to Dermatologist Dr. Terri Piedra per Dr. Dellis Anes.

## 2022-09-28 NOTE — Patient Instructions (Addendum)
1. Moderate intermittent asthma, uncomplicated - Lung testing not done. - I think we are doing well from from a breathing perspective.  - Daily controller medication(s): Wixela 250/50mg  one puff once daily - Rescue medications: albuterol 4 puffs every 4-6 hours as needed WITH COUGHING/WHEEZING - Changes during respiratory infections or worsening symptoms: Add on Advair 250/74mcg to 1 puff twice daily for TWO WEEKS TO PREVENT PREDNISONE. - Asthma control goals:  * Full participation in all desired activities (may need albuterol before activity) * Albuterol use two time or less a week on average (not counting use with activity) * Cough interfering with sleep two time or less a month * Oral steroids no more than once a year * No hospitalizations  2. Pruritus with urticaria - Continue Xolair every 2 weeks. - Upload a picture of your Medicare card to MyChart and Tammy will check on the coverage and what we need to do.  - We will refer you to see Dr. Terri Piedra and then we will make a determination about Xolair versus Dupixent (both are every two weeks).  - You can continue with the antihistamines:   AM: Pepcid (famotidine) + hydroxyzine 10mg    NOON and as needed: Claritin (loratadine) 1-2 times daily  PM: Pepcid (famotidine) + hydroxyzine 10mg   - Continue with the use of clobetasol as needed (try using consistently for 1-2 weeks twice daily until they are completely healed).  - Consider addition of Dupixent for prurigo nodularis.  3. Return in about 6 months or earlier if needed.     Please inform us of any Emergency Department visits, hospitalizations, or changes in symptoms. Call us before going to the ED for breathing or allergy symptoms since we might be able to fit you in for a sick visit. Feel free to contact us anytime with any questions, problems, or concerns.  It was a pleasure to see you again today!  Websites that have reliable patient information: 1. American Academy of Asthma,  Allergy, and Immunology: www.aaaai.org 2. Food Allergy Research and Education (FARE): foodallergy.org 3. Mothers of Asthmatics: http://www.asthmacommunitynetwork.org 4. American College of Allergy, Asthma, and Immunology: www.acaai.org   COVID-19 Vaccine Information can be found at: [email or web address] For questions related to vaccine distribution or appointments, please email [email or web address] or call [phone].   We realize that you might be concerned about having an allergic reaction to the COVID19 vaccines. To help with that concern, WE ARE OFFERING THE COVID19 VACCINES IN OUR OFFICE! Ask the front desk for dates!     "Like" [place] on Facebook and Instagram for our latest updates!      A healthy democracy works best when [employer] participate! Make sure you are registered to vote! If you have moved or changed any of your contact information, you will need to get this updated before voting!  In some cases, you MAY be able to register to vote online: [email or web address]

## 2022-09-28 NOTE — Progress Notes (Signed)
FOLLOW UP  Date of Service/Encounter:  09/28/22   Assessment:   Pruritis - with sensitization to dust mite    Chronic urticaria - doing better on Xolair every 2 weeks (administered at home)   Mild eosinophilia (AEC 500)   Intermittent asthma, uncomplicated    Plan/Recommendations:   1. Moderate intermittent asthma, uncomplicated - Lung testing not done. - I think we are doing well from from a breathing perspective.  - Daily controller medication(s): Wixela 250/50mg  one puff once daily - Rescue medications: albuterol 4 puffs every 4-6 hours as needed WITH COUGHING/WHEEZING - Changes during respiratory infections or worsening symptoms: Add on Advair 250/69mcg to 1 puff twice daily for TWO WEEKS TO PREVENT PREDNISONE. - Asthma control goals:  * Full participation in all desired activities (may need albuterol before activity) * Albuterol use two time or less a week on average (not counting use with activity) * Cough interfering with sleep two time or less a month * Oral steroids no more than once a year * No hospitalizations  2. Pruritus with urticaria - Continue Xolair every 2 weeks. - Upload a picture of your Medicare card to MyChart and Tammy will check on the coverage and what we need to do.  - We will refer you to see Dr. Terri Piedra and then we will make a determination about Xolair versus Dupixent (both are every two weeks).  - You can continue with the antihistamines:   AM: Pepcid (famotidine) + hydroxyzine 10mg    NOON and as needed: Claritin (loratadine) 1-2 times daily  PM: Pepcid (famotidine) + hydroxyzine 10mg   - Continue with the use of clobetasol as needed (try using consistently for 1-2 weeks twice daily until they are completely healed).  - Consider addition of Dupixent for prurigo nodularis.  3. Return in about 6 months or earlier if needed.    Subjective:   [name] is a 67 y.o. female presenting today for follow up of  Chief Complaint  Patient  presents with   Follow-up    [name] has a history of the following: Patient Active Problem List   Diagnosis Date Noted   Cough 10/26/2021   Wheezing 10/26/2021   Elevated coronary artery calcium score 07/30/2021   Vitamin D deficiency 06/11/2021   Dysuria 08/31/2020   Chronic pruritus 05/30/2020   Rash 04/03/2020   Chest pain 11/14/2019   Insomnia 11/14/2019   Pelvic prolapse 10/06/2019   Leg pain, bilateral 05/01/2019   Acute diverticulitis 12/23/2018   Asthma 10/02/2018   Pain in right hand 07/12/2018   Pain in both feet 11/08/2017   Nodule of chest wall 10/11/2017   Mass of right side of neck 10/11/2017   Varicose veins of bilateral lower extremities with other complications 08/30/2017   Complication of anesthesia    Right groin pain 04/21/2017   Hyperlipidemia LDL goal <100 03/23/2017   Dorsalgia 03/01/2017   Rapid palpitations 03/01/2017   Preop cardiovascular exam 03/01/2017   Right lumbar radiculopathy 08/15/2016   Rib pain on right side 03/03/2016   Nosebleed 03/03/2016   Acute colitis 08/30/2013   GERD (gastroesophageal reflux disease) 08/14/2012   Pain and swelling of lower leg 10/23/2011   Depression 07/10/2011   History of renal stone 04/15/2011   Pleural effusion 04/14/2011   Hypokalemia 04/14/2011   Chronic low back pain 03/29/2011   Obesity 03/29/2011   Encounter for well adult exam with abnormal findings 12/30/2010   THYROID NODULE, RIGHT 02/21/2010   NECK PAIN, RIGHT  02/21/2010   Bilateral lower extremity edema 02/21/2010   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  [name] is a 67 y.o. female presenting for a follow up visit.  She was last seen in  March 2024.  At that time, we did not do lung testing.  We continued with Wixela 250 mcg 1 puff once daily as well as albuterol as needed.  For her pruritus, we decided to hold off on the Xolair until she saw dermatology.  We continue with Allegra 1 to 2 tablets twice daily.  She also continued with clobetasol as needed.  Evidently she restarted the Xolair after our visit.  She did go to see Dr. Langston Reusing. She did not feel that it helped much. No biopsy was done. She was off of the Xolair of weeks. She felt that things worsened without it. She is concerned about the price when she goes to Medicare. She has seen Dr. Margo Aye in the past and has had biopsies done. She is interested in trying Dr. Terri Piedra.  They just found out that they have mold underneath the house yesterday. They had a different person come out to inspect the crawlspace.   Xolair is working well. She feels that the itching returns on the day before she is supposed to get it. She remains on the Allegra. She is doing one tablet twice daily. She is essentially doing one tablet 180mg  twice daily. She has not needed prednisone.  She does continue to have these lesions on her arm as well as 1 on her upper left back in her thigh.  They appear like eschars.  She uses clobetasol, but it seems that she does not use it consistently until they are healed.  She will use it once and then stop using it.  She has tried Bactroban in the past as well, but again she is not consistent.  I recommended that she go ahead and use it consistently for  Asthma/Respiratory Symptom History: She remain son the Wixela one puff at night time. This is working well. She remains on the albuterol very rarely - she has not used it since she started the [place]. She is sleeping well at night and is not waking herself up coughing.   She has kidney stones currently. There is hope that they will pass on their own. She remains on an antibiotic (Keflex 250mg  once daily). She has been  on it for around 6 months.   Otherwise, there have been no changes to her past medical history, surgical history, family history, or social history.    Review of Systems  Constitutional: Negative.  Negative for chills, fever, malaise/fatigue and weight loss.  HENT: Negative.  Negative for congestion, ear discharge, ear pain and sinus pain.   Eyes:  Negative for blurred vision, pain, discharge and redness.  Respiratory:  Negative for cough, sputum production, shortness of breath and wheezing.   Cardiovascular: Negative.  Negative for chest pain and palpitations.  Gastrointestinal:  Negative for abdominal pain, constipation, diarrhea, heartburn, nausea and vomiting.  Skin: Negative.  Negative for itching and rash.  Neurological:  Negative for dizziness and headaches.  Endo/Heme/Allergies:  Positive for environmental allergies. Does not bruise/bleed easily.  All other systems  reviewed and are negative.      Objective:   Blood pressure 118/72, pulse 72, temperature 97.6 F (36.4 C), resp. rate 16, weight 196 lb 4 oz (89 kg), SpO2 96 %. Body mass index is 34.76 kg/m.    Physical Exam Vitals reviewed.  Constitutional:      Appearance: She is well-developed.  HENT:     Head: Normocephalic and atraumatic.     Right Ear: Tympanic membrane, ear canal and external ear normal.     Left Ear: Tympanic membrane, ear canal and external ear normal.     Nose: No nasal deformity, septal deviation, mucosal edema or rhinorrhea.     Right Turbinates: Enlarged, swollen and pale.     Left Turbinates: Enlarged, swollen and pale.     Right Sinus: No maxillary sinus tenderness or frontal sinus tenderness.     Left Sinus: No maxillary sinus tenderness or frontal sinus tenderness.     Comments: No nasal polyps.    Mouth/Throat:     Mouth: Mucous membranes are not pale and not dry.     Pharynx: Uvula midline.  Eyes:     General: Lids are normal. No allergic shiner.       Right eye: No discharge.         Left eye: No discharge.     Conjunctiva/sclera: Conjunctivae normal.     Right eye: Right conjunctiva is not injected. No chemosis.    Left eye: Left conjunctiva is not injected. No chemosis.    Pupils: Pupils are equal, round, and reactive to light.  Cardiovascular:     Rate and Rhythm: Normal rate and regular rhythm.     Heart sounds: Normal heart sounds.  Pulmonary:     Effort: Pulmonary effort is normal. No tachypnea, accessory muscle usage or respiratory distress.     Breath sounds: Normal breath sounds. No wheezing, rhonchi or rales.     Comments: Moving air well in all lung fields. Chest:     Chest wall: No tenderness.  Lymphadenopathy:     Cervical: No cervical adenopathy.  Skin:    General: Skin is warm.     Capillary Refill: Capillary refill takes less than 2 seconds.     Coloration: Skin is not pale.     Findings: Lesion present. No abrasion, erythema, petechiae or rash. Rash is not papular, urticarial or vesicular.     Comments: Overall, skin looks much better.  She has some lesions on her right arm and her upper back with eschar formation. There is no oozing or surrounding erythema. There are no excoriations present.   Neurological:     Mental Status: She is alert.  Psychiatric:        Behavior: Behavior is cooperative.      Diagnostic studies: none       Malachi Bonds, MD  [facility]

## 2022-10-03 ENCOUNTER — Ambulatory Visit: Payer: No Typology Code available for payment source | Admitting: Family Medicine

## 2022-10-03 ENCOUNTER — Other Ambulatory Visit: Payer: Self-pay | Admitting: Internal Medicine

## 2022-10-03 VITALS — BP 116/60 | HR 105 | Temp 98.2°F | Ht 63.0 in | Wt 195.3 lb

## 2022-10-03 DIAGNOSIS — R062 Wheezing: Secondary | ICD-10-CM

## 2022-10-03 DIAGNOSIS — J209 Acute bronchitis, unspecified: Secondary | ICD-10-CM

## 2022-10-03 MED ORDER — AZITHROMYCIN 250 MG PO TABS: ORAL_TABLET | ORAL | 0 refills | Status: AC

## 2022-10-03 MED ORDER — HYDROCODONE BIT-HOMATROP MBR 5-1.5 MG/5ML PO SOLN: 5.0000 mL | Freq: Three times a day (TID) | ORAL | 0 refills | Status: DC | PRN

## 2022-10-03 MED ORDER — PREDNISONE 20 MG PO TABS: ORAL_TABLET | ORAL | 0 refills | Status: DC
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
## 2022-10-11 ENCOUNTER — Telehealth: Payer: Self-pay

## 2022-10-11 NOTE — Telephone Encounter (Signed)
Referral has been faxed to Dr. Dorita Sciara office.  Patient has been informed via MyChart.

## 2022-10-11 NOTE — Telephone Encounter (Signed)
-----   Message from Alfonse Spruce, MD sent at 09/28/2022  9:05 AM EDT ----- Derm referral placed for Dr. Terri Piedra.

## 2022-10-12 ENCOUNTER — Telehealth: Payer: Self-pay | Admitting: Internal Medicine

## 2022-10-12 DIAGNOSIS — E1065 Type 1 diabetes mellitus with hyperglycemia: Secondary | ICD-10-CM

## 2022-10-12 DIAGNOSIS — R739 Hyperglycemia, unspecified: Secondary | ICD-10-CM

## 2022-10-12 DIAGNOSIS — E559 Vitamin D deficiency, unspecified: Secondary | ICD-10-CM

## 2022-10-12 NOTE — Telephone Encounter (Signed)
Pt has an appt on 8/13 she is requesting labs before this appt. Please let her know if this is ok and when.

## 2022-10-12 NOTE — Telephone Encounter (Signed)
Ok labs are in 

## 2022-10-25 ENCOUNTER — Other Ambulatory Visit (HOSPITAL_COMMUNITY): Payer: Self-pay

## 2022-10-25 ENCOUNTER — Telehealth: Payer: Self-pay | Admitting: Allergy & Immunology

## 2022-10-25 NOTE — Telephone Encounter (Signed)
Patient stated she wanted her Xolair sent over to the [facility] as she stated that Dr.Gallagher will give her a note for the pharmacy and the note will make it more cheaper. She stated she didn't get it ordered last week.   Best Contact:[phone]

## 2022-10-25 NOTE — Telephone Encounter (Signed)
Spoke to patient regarding her Xolair and advised due to change to [facility] will need to get Rx through patient assistance due to affordablilty will mail consent for patient to sign and return

## 2022-10-25 NOTE — Telephone Encounter (Signed)
I have no idea what that means... I am routing to Tammy to see if she can help with the cost question.

## 2022-10-27 ENCOUNTER — Other Ambulatory Visit (HOSPITAL_COMMUNITY): Payer: Self-pay

## 2022-10-31 ENCOUNTER — Other Ambulatory Visit (HOSPITAL_COMMUNITY): Payer: Self-pay

## 2022-10-31 ENCOUNTER — Encounter: Payer: Self-pay | Admitting: Pharmacist

## 2022-10-31 ENCOUNTER — Other Ambulatory Visit: Payer: Self-pay

## 2022-10-31 MED ORDER — LEVOTHYROXINE SODIUM 112 MCG PO TABS: 112.0000 ug | ORAL_TABLET | Freq: Every day | ORAL | 3 refills | Status: DC

## 2022-10-31 MED ORDER — TAMSULOSIN HCL 0.4 MG PO CAPS
0.4000 mg | ORAL_CAPSULE | Freq: Every day | ORAL | 1 refills | Status: DC
  Filled 2022-10-31: qty 90, 90d supply, fill #0

## 2022-10-31 MED FILL — Fluticasone-Salmeterol Aer Powder BA 250-50 MCG/ACT: RESPIRATORY_TRACT | 90 days supply | Qty: 180 | Fill #0 | Status: CN

## 2022-11-03 ENCOUNTER — Other Ambulatory Visit: Payer: Self-pay

## 2022-11-06 ENCOUNTER — Other Ambulatory Visit (HOSPITAL_COMMUNITY): Payer: Self-pay

## 2022-11-06 ENCOUNTER — Other Ambulatory Visit: Payer: Self-pay

## 2022-11-06 ENCOUNTER — Other Ambulatory Visit: Payer: Self-pay | Admitting: Allergy & Immunology

## 2022-11-06 ENCOUNTER — Other Ambulatory Visit: Payer: Self-pay | Admitting: Internal Medicine

## 2022-11-06 MED ORDER — ALBUTEROL SULFATE HFA 108 (90 BASE) MCG/ACT IN AERS
2.0000 | INHALATION_SPRAY | RESPIRATORY_TRACT | 3 refills | Status: DC | PRN
  Filled 2022-11-06: qty 18, 17d supply, fill #0

## 2022-11-06 MED ORDER — ACETAMINOPHEN 500 MG PO TABS
500.0000 mg | ORAL_TABLET | ORAL | 2 refills | Status: DC | PRN
  Filled 2022-11-06: qty 30, fill #0

## 2022-11-06 MED FILL — Fluticasone-Salmeterol Aer Powder BA 250-50 MCG/ACT: RESPIRATORY_TRACT | 30 days supply | Qty: 60 | Fill #0 | Status: AC

## 2022-11-07 ENCOUNTER — Encounter: Payer: Self-pay | Admitting: Internal Medicine

## 2022-11-07 ENCOUNTER — Ambulatory Visit (INDEPENDENT_AMBULATORY_CARE_PROVIDER_SITE_OTHER): Payer: No Typology Code available for payment source

## 2022-11-07 ENCOUNTER — Other Ambulatory Visit (HOSPITAL_COMMUNITY): Payer: Self-pay

## 2022-11-07 ENCOUNTER — Ambulatory Visit (INDEPENDENT_AMBULATORY_CARE_PROVIDER_SITE_OTHER): Payer: No Typology Code available for payment source | Admitting: Internal Medicine

## 2022-11-07 ENCOUNTER — Encounter: Payer: Self-pay | Admitting: Pharmacist

## 2022-11-07 ENCOUNTER — Other Ambulatory Visit: Payer: Self-pay

## 2022-11-07 VITALS — BP 126/84 | HR 74 | Temp 97.9°F | Ht 63.0 in | Wt 193.0 lb

## 2022-11-07 DIAGNOSIS — M25532 Pain in left wrist: Secondary | ICD-10-CM

## 2022-11-07 DIAGNOSIS — M19112 Post-traumatic osteoarthritis, left shoulder: Secondary | ICD-10-CM

## 2022-11-07 DIAGNOSIS — M25511 Pain in right shoulder: Secondary | ICD-10-CM

## 2022-11-07 DIAGNOSIS — M47812 Spondylosis without myelopathy or radiculopathy, cervical region: Secondary | ICD-10-CM | POA: Diagnosis not present

## 2022-11-07 DIAGNOSIS — M19032 Primary osteoarthritis, left wrist: Secondary | ICD-10-CM | POA: Diagnosis not present

## 2022-11-07 DIAGNOSIS — G8929 Other chronic pain: Secondary | ICD-10-CM

## 2022-11-07 DIAGNOSIS — M19011 Primary osteoarthritis, right shoulder: Secondary | ICD-10-CM | POA: Diagnosis not present

## 2022-11-07 DIAGNOSIS — M19012 Primary osteoarthritis, left shoulder: Secondary | ICD-10-CM | POA: Diagnosis not present

## 2022-11-07 DIAGNOSIS — W19XXXA Unspecified fall, initial encounter: Secondary | ICD-10-CM

## 2022-11-07 DIAGNOSIS — M79602 Pain in left arm: Secondary | ICD-10-CM | POA: Diagnosis not present

## 2022-11-07 DIAGNOSIS — M25512 Pain in left shoulder: Secondary | ICD-10-CM

## 2022-11-07 DIAGNOSIS — M542 Cervicalgia: Secondary | ICD-10-CM

## 2022-11-07 NOTE — Progress Notes (Unsigned)
Subjective:  Patient ID: [name], female    DOB: 1955-06-05  Age: 67 y.o. MRN: [id number]  CC: Osteoarthritis   HPI [name] presents for f/up ----  Discussed the use of AI scribe software for clinical note transcription with the patient, who gave verbal consent to proceed.  History of Present Illness   The patient presents with a chief complaint of left shoulder pain and limited range of motion, which has been ongoing since a fall in December. The pain is described as intermittent but has worsened over the last few days. The patient reports difficulty in raising the arm fully and has been experiencing challenges with daily activities such as dressing. Despite self-management with ice, Epsom salt, and over-the-counter pain medication (Ibuprofen), the pain persists and is particularly sensitive at the top of the shoulder.       Outpatient Medications Prior to Visit  Medication Sig Dispense Refill   acetaminophen (TYLENOL) 500 MG tablet Take 1 tablet (500 mg total) by mouth as needed. Take 500 mg by mouth. 30 tablet 2   albuterol (VENTOLIN HFA) 108 (90 Base) MCG/ACT inhaler Inhale 2 puffs into the lungs every 4 (four) hours as needed for wheezing or shortness of breath. 18 g 3   cephALEXin (KEFLEX) 250 MG capsule Take 250 mg by mouth daily.     Cholecalciferol 50 MCG (2000 UT) TABS 1 tab by mouth once daily (Patient taking differently: Take 2,000 Units by mouth in the morning.) 30 tablet 99   clobetasol cream (TEMOVATE) 0.05 % USE 1 APPLICATION SPARINGLY ONCE A DAY AS NEEDED TO RED ITCHY AREAS. DO NOT USE ON FACE, NECK, GROIN, OR ARMPIT REGION. DO NOT USE LONGER THAN 2 WEEKS IN A ROW 30 g 2   cyclobenzaprine (FLEXERIL) 10 MG tablet Take by mouth.     docusate sodium (COLACE) 100 MG capsule Take 100 mg by mouth 2 (two) times daily as needed for mild constipation.     Estradiol (IMVEXXY MAINTENANCE PACK) 4 MCG INST Insert 1 vaginal insert as directed twice a week 24 each 2    ESTRADIOL ACETATE VA Place 1 Dose vaginally 2 (two) times a week. Take (1 ml) (0.2mg /ml )cream  twice a week on Monday and Friday     famotidine (PEPCID) 20 MG tablet Take 20 mg by mouth 2 (two) times daily.     fluticasone-salmeterol (ADVAIR DISKUS) 250-50 MCG/ACT AEPB Inhale 1 puff into the lungs 2 (two) times daily. 180 each 3   furosemide (LASIX) 40 MG tablet TAKE 1 TABLET BY MOUTH EVERY DAY AS NEEDED FOR FLUID OR EDEMA 90 tablet 1   guaiFENesin (MUCINEX PO) Take by mouth.     HYDROcodone bit-homatropine (HYCODAN) 5-1.5 MG/5ML syrup Take 5 mLs by mouth every 8 (eight) hours as needed for cough. 120 mL 0   hydrOXYzine (ATARAX) 10 MG tablet TAKE 1 TABLET BY MOUTH THREE TIMES A DAY AS NEEDED 270 tablet 1   hyoscyamine (ANASPAZ) 0.125 MG TBDP disintergrating tablet Take by mouth.     levothyroxine (SYNTHROID) 100 MCG tablet Take 1 tablet (100 mcg total) by mouth every other day. 45 tablet 3   levothyroxine (SYNTHROID) 112 MCG tablet Take 1 tablet (112 mcg total) by mouth every other day. In addition to qod 100 mcg 45 tablet 3   levothyroxine (SYNTHROID) 112 MCG tablet Take 1 tablet (112 mcg total) by mouth daily. 90 tablet 3   loratadine (CLARITIN) 10 MG tablet Take 10-20 mg by mouth 2 (  two) times daily as needed for allergies.     metoprolol tartrate (LOPRESSOR) 25 MG tablet Take 1 tablet (25 mg total) by mouth 2 (two) times daily. 180 tablet 3   omalizumab (XOLAIR) 150 MG/ML prefilled syringe Inject 300 mg into the skin every 14 (fourteen) days. 4 mL 11   omalizumab (XOLAIR) 150 MG/ML prefilled syringe Inject 300 mg into the skin every 14 (fourteen) days. 4 mL 3   pantoprazole (PROTONIX) 40 MG tablet TAKE 1 TABLET BY MOUTH EVERY DAY 90 tablet 3   predniSONE (DELTASONE) 20 MG tablet Take two tablets by mouth once daily for 5 days. 10 tablet 0   rosuvastatin (CRESTOR) 40 MG tablet TAKE 1 TABLET BY MOUTH EVERY DAY 90 tablet 3   tamsulosin (FLOMAX) 0.4 MG CAPS capsule Take 1 capsule (0.4 mg total) by  mouth daily. 90 capsule 1   zolpidem (AMBIEN) 10 MG tablet TAKE 1 TABLET AT BEDTIME ASNEEDED FOR SLEEP 90 tablet 1   Facility-Administered Medications Prior to Visit  Medication Dose Route Frequency Provider Last Rate Last Admin   omalizumab Geoffry Paradise) injection 300 mg  300 mg Subcutaneous Q28 days Alfonse Spruce, MD   300 mg at 01/19/21 0928    ROS Review of Systems  Constitutional: Negative.  Negative for diaphoresis and fatigue.  HENT: Negative.    Eyes: Negative.   Respiratory: Negative.  Negative for cough, chest tightness and wheezing.   Cardiovascular:  Negative for chest pain, palpitations and leg swelling.  Gastrointestinal: Negative.  Negative for abdominal pain, diarrhea and vomiting.  Endocrine: Negative.   Genitourinary: Negative.  Negative for difficulty urinating.  Musculoskeletal:  Positive for arthralgias and neck pain. Negative for joint swelling and myalgias.  Skin: Negative.   Neurological: Negative.  Negative for dizziness and weakness.  Hematological:  Negative for adenopathy. Does not bruise/bleed easily.  Psychiatric/Behavioral: Negative.      Objective:  BP 126/84 (BP Location: Right Arm, Patient Position: Sitting, Cuff Size: Large)   Pulse 74   Temp 97.9 F (36.6 C) (Oral)   Ht 5\' 3"  (1.6 m)   Wt 193 lb (87.5 kg)   SpO2 97%   BMI 34.19 kg/m   BP Readings from Last 3 Encounters:  11/07/22 126/84  10/03/22 116/60  09/28/22 118/72    Wt Readings from Last 3 Encounters:  11/07/22 193 lb (87.5 kg)  10/03/22 195 lb 4.8 oz (88.6 kg)  09/28/22 196 lb 4 oz (89 kg)    Physical Exam Vitals reviewed.  HENT:     Mouth/Throat:     Mouth: Mucous membranes are moist.  Eyes:     General: No scleral icterus.    Conjunctiva/sclera: Conjunctivae normal.  Cardiovascular:     Rate and Rhythm: Normal rate and regular rhythm.     Heart sounds: No murmur heard. Pulmonary:     Effort: Pulmonary effort is normal.     Breath sounds: No stridor. No  wheezing, rhonchi or rales.  Abdominal:     General: Abdomen is flat.     Palpations: There is no mass.     Tenderness: There is no abdominal tenderness. There is no guarding.     Hernia: No hernia is present.  Musculoskeletal:        General: No swelling.     Right shoulder: Normal. No swelling or bony tenderness.     Left shoulder: Bony tenderness present. No swelling. Decreased range of motion.     Right wrist: Normal.  Left wrist: Normal. No swelling, deformity, effusion or tenderness. Normal range of motion.     Cervical back: Neck supple.     Right lower leg: No edema.     Left lower leg: No edema.  Lymphadenopathy:     Cervical: No cervical adenopathy.  Skin:    General: Skin is warm.  Neurological:     General: No focal deficit present.     Mental Status: She is alert.  Psychiatric:        Mood and Affect: Mood normal.        Behavior: Behavior normal.     Lab Results  Component Value Date   WBC 5.7 06/13/2022   HGB 13.8 06/13/2022   HCT 41.0 06/13/2022   PLT 232.0 06/13/2022   GLUCOSE 85 06/13/2022   CHOL 120 08/15/2022   TRIG 91 08/15/2022   HDL 56 08/15/2022   LDLCALC 47 08/15/2022   ALT 19 08/15/2022   AST 27 08/15/2022   NA 139 06/13/2022   K 3.9 06/13/2022   CL 104 06/13/2022   CREATININE 0.86 06/13/2022   BUN 17 06/13/2022   CO2 27 06/13/2022   TSH 2.93 06/13/2022   INR 0.94 01/27/2009   HGBA1C 5.8 06/13/2022   MICROALBUR 1.4 06/13/2022    MM 3D SCREEN BREAST BILATERAL  Result Date: 06/06/2022 CLINICAL DATA:  Screening. EXAM: DIGITAL SCREENING BILATERAL MAMMOGRAM WITH TOMOSYNTHESIS AND CAD TECHNIQUE: Bilateral screening digital craniocaudal and mediolateral oblique mammograms were obtained. Bilateral screening digital breast tomosynthesis was performed. The images were evaluated with computer-aided detection. COMPARISON:  Previous exam(s). ACR Breast Density Category c: The breasts are heterogeneously dense, which may obscure small masses.  FINDINGS: There are no findings suspicious for malignancy. IMPRESSION: No mammographic evidence of malignancy. A result letter of this screening mammogram will be mailed directly to the patient. RECOMMENDATION: Screening mammogram in one year. (Code:SM-B-01Y) BI-RADS CATEGORY  1: Negative. Electronically Signed   By: Annia Belt M.D.   On: 06/06/2022 08:42   DG Wrist Complete Left  Result Date: 11/07/2022 CLINICAL DATA:  Pain after a fall 7 months ago. EXAM: LEFT WRIST - COMPLETE 3+ VIEW COMPARISON:  Left hand 06/10/2021 FINDINGS: Mild degenerative changes in the STT joints. No evidence of acute fracture or dislocation. No focal bone lesion. No bone destruction. Soft tissues are unremarkable. IMPRESSION: Mild degenerative changes in the left wrist. No acute displaced fractures identified. Electronically Signed   By: Burman Nieves M.D.   On: 11/07/2022 15:37   DG Shoulder Right  Result Date: 11/07/2022 CLINICAL DATA:  Pain after a fall about 7 months ago. EXAM: RIGHT SHOULDER - 2+ VIEW COMPARISON:  None Available. FINDINGS: Degenerative changes in the glenohumeral joint with joint space narrowing and subcortical cyst formation on the glenoid. No evidence of acute fracture or dislocation. No destructive or expansile bone lesions. Soft tissues are unremarkable. IMPRESSION: Degenerative changes in the right shoulder. No acute bony abnormalities. Electronically Signed   By: Burman Nieves M.D.   On: 11/07/2022 15:36   DG Shoulder Left  Result Date: 11/07/2022 CLINICAL DATA:  Left upper extremity pain after a fall. Fell 7 months ago. EXAM: LEFT SHOULDER - 2+ VIEW COMPARISON:  None Available. FINDINGS: Degenerative changes in the glenohumeral and acromioclavicular joints. No evidence of acute fracture or dislocation. No focal bone lesion or bone destruction. Soft tissues are unremarkable. IMPRESSION: Degenerative changes in the left shoulder. No acute bony abnormalities. Electronically Signed   By: Burman Nieves M.D.   On:  11/07/2022 15:35   DG Cervical Spine Complete  Result Date: 11/07/2022 CLINICAL DATA:  Pain after a fall. Neck and left upper extremity pain for 2-3 days. Fell 7 months ago. EXAM: CERVICAL SPINE - COMPLETE 4+ VIEW COMPARISON:  None Available. FINDINGS: Normal alignment of the cervical spine. No vertebral compression deformities. No focal bone lesion or bone destruction. Bone cortex appears intact. C1-2 articulation appears intact. No bony encroachment upon the neural foramina. Degenerative changes throughout the cervical spine with narrowed interspaces and endplate osteophyte formation, most prominent at C5-6 and C6-7 levels. Degenerative changes in the facet joints. No prevertebral soft tissue swelling. IMPRESSION: Normal alignment. Degenerative changes. No acute displaced fractures identified. Electronically Signed   By: Burman Nieves M.D.   On: 11/07/2022 15:35     Assessment & Plan:   Fall, initial encounter -     DG Cervical Spine Complete; Future -     DG Shoulder Left; Future -     DG Shoulder Right; Future -     DG Wrist Complete Left; Future  Neck pain, bilateral -     DG Cervical Spine Complete; Future  Wrist pain, acute, left -     DG Wrist Complete Left; Future  Chronic pain of both shoulders -     DG Shoulder Left; Future -     DG Shoulder Right; Future  Post-traumatic osteoarthritis of left shoulder- Plain films are positive for DDD but her pain and decreased ROM are out of proportion so I have asked her to see ortho to be evaluated for a rotator cuff disorder. -     Ambulatory referral to Orthopedic Surgery     Follow-up: Return in about 3 months (around 02/07/2023).  Sanda Linger, MD

## 2022-11-07 NOTE — Patient Instructions (Signed)
Shoulder Pain Many things can cause shoulder pain, including: An injury to the shoulder. Overuse of the shoulder. Arthritis. The source of the pain can be: Inflammation. An injury to the shoulder joint. An injury to a tendon, ligament, or bone. Follow these instructions at home: Pay attention to changes in your symptoms. Let your health care provider know about them. Follow these instructions to relieve your pain. If you have a removable sling: Wear the sling as told by your provider. Remove it only as told by your provider. Check the skin around the sling every day. Tell your provider about any concerns. Loosen the sling if your fingers tingle, become numb, or become cold. Keep the sling clean. If the sling is not waterproof: Do not let it get wet. Remove it to shower or bathe. Move your arm as little as possible, but keep your hand moving to prevent swelling. Managing pain, stiffness, and swelling  If told, put ice on the painful area. If you have a removable sling or immobilizer, remove it as told by your provider. Put ice in a plastic bag. Place a towel between your skin and the bag. Leave the ice on for 20 minutes, 2-3 times a day. If your skin turns bright red, remove the ice right away to prevent skin damage. The risk of damage is higher if you cannot feel pain, heat, or cold. Move your fingers often to reduce stiffness and swelling. Squeeze a soft ball or a foam pad as much as possible. This helps to keep the shoulder from swelling. It also helps to strengthen the arm. General instructions Take over-the-counter and prescription medicines only as told by your provider. Exercise may help with pain management. Perform exercises if told by your provider. You may be referred to a physical therapist to help in your recovery process. Keep all follow-up visits in order to avoid any type of permanent shoulder disability or chronic pain problems. Contact a health care provider  if: Your pain is not relieved with medicines. New pain develops in your arm, hand, or fingers. You loosen your sling and your arm, hand, or fingers remain tingly, numb, swollen, or painful. Get help right away if: Your arm, hand, or fingers turn white or blue. This information is not intended to replace advice given to you by your health care provider. Make sure you discuss any questions you have with your health care provider. Document Revised: 11/11/2021 Document Reviewed: 11/11/2021 Elsevier Patient Education  2024 Elsevier Inc.  

## 2022-11-08 ENCOUNTER — Encounter: Payer: Self-pay | Admitting: Internal Medicine

## 2022-11-08 ENCOUNTER — Other Ambulatory Visit (HOSPITAL_COMMUNITY): Payer: Self-pay

## 2022-11-09 ENCOUNTER — Other Ambulatory Visit (HOSPITAL_COMMUNITY): Payer: Self-pay

## 2022-11-10 ENCOUNTER — Other Ambulatory Visit: Payer: Self-pay | Admitting: Physician Assistant

## 2022-11-13 ENCOUNTER — Other Ambulatory Visit: Payer: Self-pay

## 2022-11-20 ENCOUNTER — Other Ambulatory Visit: Payer: Self-pay

## 2022-11-20 ENCOUNTER — Encounter: Payer: Self-pay | Admitting: Orthopedic Surgery

## 2022-11-20 ENCOUNTER — Ambulatory Visit (INDEPENDENT_AMBULATORY_CARE_PROVIDER_SITE_OTHER): Payer: No Typology Code available for payment source | Admitting: Orthopedic Surgery

## 2022-11-20 DIAGNOSIS — M19012 Primary osteoarthritis, left shoulder: Secondary | ICD-10-CM

## 2022-11-20 DIAGNOSIS — M25519 Pain in unspecified shoulder: Secondary | ICD-10-CM

## 2022-11-20 MED ORDER — BUPIVACAINE HCL 0.5 % IJ SOLN
9.0000 mL | INTRAMUSCULAR | Status: AC | PRN
Start: 2022-11-20 — End: 2022-11-20
  Administered 2022-11-20: 9 mL via INTRA_ARTICULAR

## 2022-11-20 MED ORDER — LIDOCAINE HCL 1 % IJ SOLN
5.0000 mL | INTRAMUSCULAR | Status: AC | PRN
Start: 2022-11-20 — End: 2022-11-20
  Administered 2022-11-20: 5 mL

## 2022-11-20 MED ORDER — METHYLPREDNISOLONE ACETATE 40 MG/ML IJ SUSP
40.0000 mg | INTRAMUSCULAR | Status: AC | PRN
Start: 2022-11-20 — End: 2022-11-20
  Administered 2022-11-20: 40 mg via INTRA_ARTICULAR
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
## 2022-11-22 ENCOUNTER — Other Ambulatory Visit: Payer: Self-pay | Admitting: Internal Medicine

## 2022-11-22 ENCOUNTER — Encounter (INDEPENDENT_AMBULATORY_CARE_PROVIDER_SITE_OTHER): Payer: Self-pay

## 2022-11-22 ENCOUNTER — Other Ambulatory Visit (HOSPITAL_COMMUNITY): Payer: Self-pay

## 2022-11-25 ENCOUNTER — Other Ambulatory Visit (HOSPITAL_COMMUNITY): Payer: Self-pay

## 2022-11-27 ENCOUNTER — Other Ambulatory Visit: Payer: Self-pay | Admitting: Internal Medicine

## 2022-11-27 ENCOUNTER — Other Ambulatory Visit (HOSPITAL_COMMUNITY): Payer: Self-pay

## 2022-11-27 ENCOUNTER — Telehealth: Payer: Self-pay | Admitting: Internal Medicine

## 2022-11-27 ENCOUNTER — Other Ambulatory Visit: Payer: Self-pay

## 2022-11-27 NOTE — Telephone Encounter (Signed)
Prescription Request  11/27/2022  LOV: 06/13/2022  What is the name of the medication or equipment? zolpidem (AMBIEN) 10 MG tablet   Have you contacted your pharmacy to request a refill? [facility] [place]  [address] [place]  [phone]  Which pharmacy would you like this sent to?  Patient notified that their request is being sent to the clinical staff for review and that they should receive a response within 2 business days.   Please advise at Mobile [phone] (mobile)

## 2022-11-28 ENCOUNTER — Other Ambulatory Visit: Payer: Self-pay

## 2022-11-28 ENCOUNTER — Other Ambulatory Visit (HOSPITAL_COMMUNITY): Payer: Self-pay

## 2022-11-28 MED ORDER — ZOLPIDEM TARTRATE 10 MG PO TABS
ORAL_TABLET | Freq: Every evening | ORAL | 1 refills | Status: DC | PRN
  Filled 2022-11-28: qty 90, 90d supply, fill #0
  Filled 2023-02-26 (×2): qty 30, 30d supply, fill #1

## 2022-11-28 NOTE — Telephone Encounter (Signed)
Done erx 

## 2022-11-29 ENCOUNTER — Telehealth: Payer: Self-pay | Admitting: Internal Medicine

## 2022-11-29 ENCOUNTER — Other Ambulatory Visit (INDEPENDENT_AMBULATORY_CARE_PROVIDER_SITE_OTHER): Payer: Medicare HMO

## 2022-11-29 DIAGNOSIS — R739 Hyperglycemia, unspecified: Secondary | ICD-10-CM

## 2022-11-29 DIAGNOSIS — E559 Vitamin D deficiency, unspecified: Secondary | ICD-10-CM

## 2022-11-29 LAB — HEPATIC FUNCTION PANEL
ALT: 17 U/L (ref 0–35)
AST: 17 U/L (ref 0–37)
Albumin: 3.9 g/dL (ref 3.5–5.2)
Alkaline Phosphatase: 62 U/L (ref 39–117)
Bilirubin, Direct: 0.1 mg/dL (ref 0.0–0.3)
Total Bilirubin: 0.4 mg/dL (ref 0.2–1.2)
Total Protein: 6.7 g/dL (ref 6.0–8.3)

## 2022-11-29 LAB — BASIC METABOLIC PANEL
BUN: 16 mg/dL (ref 6–23)
CO2: 26 mEq/L (ref 19–32)
Calcium: 9.1 mg/dL (ref 8.4–10.5)
Chloride: 105 mEq/L (ref 96–112)
Creatinine, Ser: 0.8 mg/dL (ref 0.40–1.20)
GFR: 77.5 mL/min (ref >60.00)
Glucose, Bld: 107 mg/dL — ABNORMAL HIGH (ref 70–99)
Potassium: 3.6 mEq/L (ref 3.5–5.1)
Sodium: 139 mEq/L (ref 135–145)

## 2022-11-29 LAB — LIPID PANEL
Cholesterol: 123 mg/dL (ref 0–200)
HDL: 60.5 mg/dL (ref >39.00)
LDL Cholesterol: 45 mg/dL (ref 0–99)
NonHDL: 62.99
Total CHOL/HDL Ratio: 2
Triglycerides: 89 mg/dL (ref 0.0–149.0)
VLDL: 17.8 mg/dL (ref 0.0–40.0)

## 2022-11-29 LAB — VITAMIN D 25 HYDROXY (VIT D DEFICIENCY, FRACTURES): VITD: 43.96 ng/mL (ref 30.00–100.00)

## 2022-11-29 LAB — HEMOGLOBIN A1C: Hgb A1c MFr Bld: 5.8 % (ref 4.6–6.5)

## 2022-11-29 NOTE — Telephone Encounter (Signed)
Patient would like a thyroid panel and a routine urine and a cbc added to her blood work from this morning.  Please add this to the orders.  Please call patient to advise.  [phone]

## 2022-11-29 NOTE — Telephone Encounter (Signed)
UA not needed unless she has new symptoms  CBC felt not needed since it has been normal 7 times since feb 2023  TSH felt not needed since it has been normal twice already in the past yr, and thyroid nodules do not cause changes in thyroid testing    thanks

## 2022-11-29 NOTE — Telephone Encounter (Signed)
Notified pt w/MD response.../lmb 

## 2022-11-29 NOTE — Progress Notes (Signed)
The test results show that your current treatment is OK, as the tests are stable.  Please continue the same plan.  There is no other need for change of treatment or further evaluation based on these results, at this time.  thanks 

## 2022-12-05 ENCOUNTER — Encounter: Payer: Self-pay | Admitting: Internal Medicine

## 2022-12-05 ENCOUNTER — Ambulatory Visit (INDEPENDENT_AMBULATORY_CARE_PROVIDER_SITE_OTHER)
Admission: RE | Admit: 2022-12-05 | Discharge: 2022-12-05 | Disposition: A | Discharge status: Home / Self Care | Payer: No Typology Code available for payment source | Source: Ambulatory Visit | Attending: Internal Medicine | Admitting: Internal Medicine

## 2022-12-05 ENCOUNTER — Ambulatory Visit (INDEPENDENT_AMBULATORY_CARE_PROVIDER_SITE_OTHER): Payer: No Typology Code available for payment source | Admitting: Internal Medicine

## 2022-12-05 VITALS — BP 126/78 | HR 80 | Temp 98.4°F | Ht 63.0 in | Wt 194.0 lb

## 2022-12-05 DIAGNOSIS — R7302 Impaired glucose tolerance (oral): Secondary | ICD-10-CM

## 2022-12-05 DIAGNOSIS — E2839 Other primary ovarian failure: Secondary | ICD-10-CM

## 2022-12-05 DIAGNOSIS — E785 Hyperlipidemia, unspecified: Secondary | ICD-10-CM

## 2022-12-05 DIAGNOSIS — E559 Vitamin D deficiency, unspecified: Secondary | ICD-10-CM | POA: Diagnosis not present

## 2022-12-05 DIAGNOSIS — E039 Hypothyroidism, unspecified: Secondary | ICD-10-CM | POA: Diagnosis not present

## 2022-12-05 DIAGNOSIS — F32A Depression, unspecified: Secondary | ICD-10-CM | POA: Diagnosis not present

## 2022-12-05 DIAGNOSIS — J4540 Moderate persistent asthma, uncomplicated: Secondary | ICD-10-CM | POA: Diagnosis not present

## 2022-12-05 DIAGNOSIS — Z23 Encounter for immunization: Secondary | ICD-10-CM | POA: Diagnosis not present

## 2022-12-05 DIAGNOSIS — E538 Deficiency of other specified B group vitamins: Secondary | ICD-10-CM

## 2022-12-05 DIAGNOSIS — Z1211 Encounter for screening for malignant neoplasm of colon: Secondary | ICD-10-CM

## 2022-12-05 NOTE — Patient Instructions (Signed)
You had the Prevnar 20 pneumonia shot today  Please continue all other medications as before, and refills have been done if requested.  Please have the pharmacy call with any other refills you may need.  Please continue your efforts at being more active, low cholesterol diet, and weight control.  You are otherwise up to date with prevention measures today.  Please keep your appointments with your specialists as you may have planned  You will be contacted regarding the referral for: colonscopy  Please schedule the bone density test before leaving today at the scheduling desk (where you check out)  Please make an Appointment to return in 6 months, or sooner if needed, also with Lab Appointment for testing done 3-5 days before at the FIRST FLOOR Lab (so this is for TWO appointments - please see the scheduling desk as you leave)
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
## 2022-12-06 ENCOUNTER — Encounter: Payer: Self-pay | Admitting: Internal Medicine

## 2022-12-06 NOTE — Assessment & Plan Note (Signed)
Stable overall, to continue inhaler prn °

## 2022-12-06 NOTE — Addendum Note (Signed)
Addended by: Corwin Levins on: 12/06/2022 09:29 PM   Modules accepted: Orders

## 2022-12-06 NOTE — Assessment & Plan Note (Signed)
Stable overall, declines need for change in tx or counseling 

## 2022-12-06 NOTE — Assessment & Plan Note (Signed)
Last vitamin D Lab Results  Component Value Date   VD25OH 43.96 11/29/2022   Stable, cont oral replacement

## 2022-12-06 NOTE — Assessment & Plan Note (Signed)
Lab Results  Component Value Date   TSH 2.93 06/13/2022   Stable, pt to continue levothyroxine 100 mcg /112 mcg qod

## 2022-12-06 NOTE — Assessment & Plan Note (Signed)
Lab Results  Component Value Date   LDLCALC 45 11/29/2022   Stable, pt to continue current statin crestor 40 qd

## 2022-12-07 ENCOUNTER — Other Ambulatory Visit: Payer: Self-pay

## 2022-12-07 ENCOUNTER — Other Ambulatory Visit (HOSPITAL_COMMUNITY): Payer: Self-pay

## 2022-12-07 DIAGNOSIS — L308 Other specified dermatitis: Secondary | ICD-10-CM | POA: Diagnosis not present

## 2022-12-07 DIAGNOSIS — L281 Prurigo nodularis: Secondary | ICD-10-CM | POA: Diagnosis not present

## 2022-12-07 MED ORDER — TRIAMCINOLONE ACETONIDE 0.1 % EX CREA
TOPICAL_CREAM | CUTANEOUS | 0 refills | Status: DC
  Filled 2022-12-07 (×2): qty 454, 30d supply, fill #0

## 2022-12-12 NOTE — Telephone Encounter (Signed)
Patient is calling back to check on the status about her injection.

## 2022-12-12 NOTE — Telephone Encounter (Signed)
L/m for patient with instructions on how to order her Xolair. I have reached out to [facility] pharmacy to give them ok to ship to home for admin

## 2022-12-15 ENCOUNTER — Other Ambulatory Visit (HOSPITAL_COMMUNITY): Payer: Self-pay

## 2022-12-20 ENCOUNTER — Other Ambulatory Visit: Payer: Self-pay

## 2022-12-20 ENCOUNTER — Other Ambulatory Visit: Payer: Self-pay | Admitting: Internal Medicine

## 2022-12-20 ENCOUNTER — Other Ambulatory Visit (HOSPITAL_COMMUNITY): Payer: Self-pay

## 2022-12-20 MED ORDER — LEVOTHYROXINE SODIUM 112 MCG PO TABS
112.0000 ug | ORAL_TABLET | Freq: Every day | ORAL | 3 refills | Status: DC
  Filled 2022-12-20: qty 30, 30d supply, fill #0
  Filled 2023-01-30: qty 30, 30d supply, fill #1
  Filled 2023-02-26 (×2): qty 30, 30d supply, fill #2
  Filled 2023-04-20: qty 30, 30d supply, fill #3
  Filled 2023-05-21: qty 30, 30d supply, fill #4
  Filled 2023-11-01: qty 30, 30d supply, fill #5
  Filled 2023-11-26: qty 30, 30d supply, fill #6

## 2022-12-21 ENCOUNTER — Other Ambulatory Visit: Payer: Self-pay

## 2022-12-22 ENCOUNTER — Encounter: Payer: Self-pay | Admitting: Gastroenterology

## 2022-12-22 ENCOUNTER — Encounter (HOSPITAL_COMMUNITY): Payer: Self-pay

## 2022-12-22 ENCOUNTER — Other Ambulatory Visit (HOSPITAL_COMMUNITY): Payer: Self-pay

## 2022-12-22 ENCOUNTER — Other Ambulatory Visit: Payer: Self-pay

## 2022-12-26 ENCOUNTER — Other Ambulatory Visit: Payer: Self-pay | Admitting: Physician Assistant

## 2022-12-26 NOTE — Telephone Encounter (Signed)
Patient of Dr. Harding. Please review for refill. Thank you!  

## 2023-01-03 ENCOUNTER — Ambulatory Visit (INDEPENDENT_AMBULATORY_CARE_PROVIDER_SITE_OTHER): Payer: No Typology Code available for payment source | Admitting: Orthopedic Surgery

## 2023-01-03 ENCOUNTER — Other Ambulatory Visit: Payer: Self-pay

## 2023-01-03 ENCOUNTER — Other Ambulatory Visit (HOSPITAL_COMMUNITY): Payer: Self-pay

## 2023-01-03 DIAGNOSIS — M19012 Primary osteoarthritis, left shoulder: Secondary | ICD-10-CM

## 2023-01-03 DIAGNOSIS — M25519 Pain in unspecified shoulder: Secondary | ICD-10-CM

## 2023-01-03 DIAGNOSIS — M19011 Primary osteoarthritis, right shoulder: Secondary | ICD-10-CM

## 2023-01-03 NOTE — Telephone Encounter (Signed)
Pt called wanting to know who to call to request a refill thank you!

## 2023-01-04 NOTE — Telephone Encounter (Signed)
Called patient again and advised info on [facility] to reorder Xolair [phone]

## 2023-01-05 ENCOUNTER — Encounter: Payer: Self-pay | Admitting: Orthopedic Surgery

## 2023-01-05 MED ORDER — BUPIVACAINE HCL 0.5 % IJ SOLN
9.0000 mL | INTRAMUSCULAR | Status: AC | PRN
Start: 2023-01-03 — End: 2023-01-03
  Administered 2023-01-03: 9 mL via INTRA_ARTICULAR

## 2023-01-05 MED ORDER — LIDOCAINE HCL 1 % IJ SOLN
5.0000 mL | INTRAMUSCULAR | Status: AC | PRN
Start: 2023-01-03 — End: 2023-01-03
  Administered 2023-01-03: 5 mL

## 2023-01-05 MED ORDER — METHYLPREDNISOLONE ACETATE 40 MG/ML IJ SUSP
40.0000 mg | INTRAMUSCULAR | Status: AC | PRN
Start: 2023-01-03 — End: 2023-01-03
  Administered 2023-01-03: 40 mg via INTRA_ARTICULAR
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
## 2023-01-13 ENCOUNTER — Other Ambulatory Visit: Payer: Self-pay

## 2023-01-15 ENCOUNTER — Telehealth: Payer: Self-pay | Admitting: *Deleted

## 2023-01-15 ENCOUNTER — Other Ambulatory Visit: Payer: Self-pay

## 2023-01-15 DIAGNOSIS — Z86010 Personal history of colonic polyps: Secondary | ICD-10-CM

## 2023-01-15 NOTE — Telephone Encounter (Signed)
I have a morning hospital outpatient procedure block at [facility] on Thursday, November 14  HD

## 2023-01-15 NOTE — Telephone Encounter (Signed)
Called patient back to schedule PV.  Patient stated that 02/15/23 will not work for her.  Its her husbands last day at work, he is retiring and she will not have a care partner on that day.

## 2023-01-15 NOTE — Telephone Encounter (Signed)
Silvio Pate,    My next available hospital outpatient procedure block is on Thursday, 02/15/2023.  I have at least one opening in that block, so please contact this patient, offer her that date, and give appropriate preparation instructions at a previsit.  H Danis

## 2023-01-15 NOTE — Telephone Encounter (Signed)
Explained to pt. That her procedure would need to be scheduled at the hospital and why,she verbalized understanding,procedure canceled for 10/17 and pre-visit 9/26,made her aware that the office will be in contact with her to get  scheduled for the hospital,all questions answered.

## 2023-01-15 NOTE — Telephone Encounter (Signed)
Pt.requested that Dr.Danis remain the physician to do her procedure.

## 2023-01-15 NOTE — Telephone Encounter (Signed)
Colonoscopy has been scheduled at [facility] on 02/15/23 at 9:15 am. Please make patient aware and reschedule appropriate PV. Thanks

## 2023-01-15 NOTE — Telephone Encounter (Signed)
Yesi,   This pt is a documented difficult intubation and his procedure will need to be done at the hospital.   Thanks,  Cathlyn Parsons

## 2023-01-16 NOTE — Telephone Encounter (Signed)
Called and spoke with patient. Colonoscopy has been scheduled at [facility] on 03/08/23 at 7:30 am, arriving at 6 am. Pt's PV appt has been rescheduled to 02/22/23 at 8 am. Pt is aware that I have mailed updated appt information.

## 2023-01-30 ENCOUNTER — Other Ambulatory Visit: Payer: Self-pay

## 2023-01-30 ENCOUNTER — Ambulatory Visit (INDEPENDENT_AMBULATORY_CARE_PROVIDER_SITE_OTHER): Payer: No Typology Code available for payment source

## 2023-01-30 ENCOUNTER — Other Ambulatory Visit (HOSPITAL_COMMUNITY): Payer: Self-pay

## 2023-01-30 ENCOUNTER — Other Ambulatory Visit: Payer: Self-pay | Admitting: Internal Medicine

## 2023-01-30 DIAGNOSIS — Z23 Encounter for immunization: Secondary | ICD-10-CM

## 2023-01-30 MED ORDER — LEVOTHYROXINE SODIUM 100 MCG PO TABS
100.0000 ug | ORAL_TABLET | ORAL | 3 refills | Status: DC
Start: 2023-01-30 — End: 2024-02-11
  Filled 2023-01-30: qty 15, 30d supply, fill #0
  Filled 2023-02-26 (×2): qty 15, 30d supply, fill #1
  Filled 2023-04-20: qty 15, 30d supply, fill #2
  Filled 2023-05-21: qty 15, 30d supply, fill #3
  Filled 2023-07-09: qty 15, 30d supply, fill #4
  Filled 2023-08-03: qty 15, 30d supply, fill #5
  Filled 2023-09-12: qty 15, 30d supply, fill #6
  Filled 2023-10-09: qty 15, 30d supply, fill #7
  Filled 2023-11-05: qty 15, 30d supply, fill #8
  Filled 2023-12-03: qty 15, 30d supply, fill #9
  Filled 2023-12-31 (×2): qty 15, 30d supply, fill #10

## 2023-01-30 MED ORDER — PANTOPRAZOLE SODIUM 40 MG PO TBEC
40.0000 mg | DELAYED_RELEASE_TABLET | Freq: Every day | ORAL | 3 refills | Status: DC
Start: 2023-01-30 — End: 2023-06-18
  Filled 2023-01-30: qty 30, 30d supply, fill #0
  Filled 2023-02-26 (×2): qty 30, 30d supply, fill #1
  Filled 2023-04-20: qty 30, 30d supply, fill #2
  Filled 2023-05-21: qty 30, 30d supply, fill #3
  Filled 2023-06-16: qty 30, 30d supply, fill #4

## 2023-01-30 MED FILL — Fluticasone-Salmeterol Aer Powder BA 250-50 MCG/ACT: RESPIRATORY_TRACT | 30 days supply | Qty: 60 | Fill #1 | Status: CN

## 2023-01-30 NOTE — Progress Notes (Signed)
Patient presented in office today for her HD Flu Vaccine. HD Flu Vaccine was administered into the patients Right Deltoid Muscle. Patient tolerated the injection well and the injection site looked normal. Patient advised to report to the office immediately if she notices any adverse reactions.

## 2023-01-31 ENCOUNTER — Other Ambulatory Visit: Payer: Self-pay

## 2023-01-31 ENCOUNTER — Other Ambulatory Visit: Payer: Self-pay | Admitting: Internal Medicine

## 2023-02-02 ENCOUNTER — Other Ambulatory Visit (HOSPITAL_COMMUNITY): Payer: Self-pay

## 2023-02-02 ENCOUNTER — Other Ambulatory Visit: Payer: Self-pay

## 2023-02-02 MED ORDER — EPINEPHRINE 0.3 MG/0.3ML IJ SOAJ: 0.3000 mg | Freq: Once | INTRAMUSCULAR | 1 refills | Status: AC

## 2023-02-07 ENCOUNTER — Other Ambulatory Visit: Payer: Self-pay | Admitting: Internal Medicine

## 2023-02-08 ENCOUNTER — Encounter: Payer: Medicare HMO | Admitting: Gastroenterology

## 2023-02-15 ENCOUNTER — Other Ambulatory Visit: Payer: Self-pay

## 2023-02-15 MED ORDER — FLUTICASONE-SALMETEROL 250-50 MCG/ACT IN AEPB: 1.0000 | INHALATION_SPRAY | Freq: Two times a day (BID) | RESPIRATORY_TRACT | 1 refills | Status: DC

## 2023-02-18 ENCOUNTER — Telehealth: Payer: Self-pay | Admitting: *Deleted

## 2023-02-18 NOTE — Telephone Encounter (Signed)
Yesi,   This pt is a documented difficult intubation and her procedure will need to be done at the hospital.   Thanks,  Olawale Marney 

## 2023-02-19 NOTE — Telephone Encounter (Signed)
Pt procedure scheduled at [facility] endo 11/14

## 2023-02-22 ENCOUNTER — Other Ambulatory Visit (HOSPITAL_COMMUNITY): Payer: Self-pay

## 2023-02-22 ENCOUNTER — Ambulatory Visit: Payer: Medicare HMO

## 2023-02-22 ENCOUNTER — Other Ambulatory Visit: Payer: Self-pay

## 2023-02-22 VITALS — Ht 62.0 in | Wt 192.0 lb

## 2023-02-22 DIAGNOSIS — Z860100 Personal history of colon polyps, unspecified: Secondary | ICD-10-CM

## 2023-02-22 MED ORDER — NA SULFATE-K SULFATE-MG SULF 17.5-3.13-1.6 GM/177ML PO SOLN
1.0000 | Freq: Once | ORAL | 0 refills | Status: AC
  Filled 2023-02-22: qty 354, 1d supply, fill #0

## 2023-02-22 NOTE — Progress Notes (Signed)
No egg or soy allergy known to patient  No issues known to pt with past sedation with any surgeries or procedures Patient denies ever being told they had issues or difficulty with intubation  No FH of Malignant Hyperthermia Pt is not on diet pills Pt is not on  home 02  Pt is not on blood thinners  Pt denies issues with constipation  No A fib or A flutter Have any cardiac testing pending--no Pt instructed to use Single[employer]com or GoodRx for a price reduction on prep  Ambulates independently

## 2023-02-23 ENCOUNTER — Other Ambulatory Visit: Payer: Self-pay

## 2023-02-26 ENCOUNTER — Other Ambulatory Visit (HOSPITAL_COMMUNITY): Payer: Self-pay

## 2023-02-26 ENCOUNTER — Telehealth: Payer: Self-pay | Admitting: Allergy & Immunology

## 2023-02-26 ENCOUNTER — Other Ambulatory Visit: Payer: Self-pay

## 2023-02-26 NOTE — Telephone Encounter (Signed)
Patient called and stated she needs a refill on Xolair medication and requested a call back.

## 2023-02-27 ENCOUNTER — Telehealth: Payer: Self-pay | Admitting: *Deleted

## 2023-02-27 NOTE — Telephone Encounter (Signed)
Spoke to patient she did not need refill only to call and order same.

## 2023-02-27 NOTE — Telephone Encounter (Signed)
Called patient and advised number again to her to call to order her xolair . Per [employer] she is good to call and order for delivery to home

## 2023-02-28 ENCOUNTER — Other Ambulatory Visit (HOSPITAL_COMMUNITY): Payer: Self-pay

## 2023-02-28 MED ORDER — IMVEXXY MAINTENANCE PACK 4 MCG VA INST
4.0000 ug | VAGINAL_INSERT | VAGINAL | 1 refills | Status: DC
  Filled 2023-02-28 – 2023-03-03 (×3): qty 8, 28d supply, fill #0
  Filled 2023-03-06 – 2023-03-09 (×2): qty 24, 84d supply, fill #0

## 2023-03-01 ENCOUNTER — Telehealth: Payer: Self-pay

## 2023-03-01 NOTE — Telephone Encounter (Signed)
Confirmed with patient that she will be attending procedure next week 03/08/23 with Dr. Myrtie Neither.

## 2023-03-02 ENCOUNTER — Other Ambulatory Visit: Payer: Self-pay

## 2023-03-02 ENCOUNTER — Other Ambulatory Visit (HOSPITAL_COMMUNITY): Payer: Self-pay

## 2023-03-03 ENCOUNTER — Other Ambulatory Visit (HOSPITAL_COMMUNITY): Payer: Self-pay

## 2023-03-03 ENCOUNTER — Telehealth: Payer: Self-pay | Admitting: Gastroenterology

## 2023-03-03 NOTE — Telephone Encounter (Signed)
Patient calling in today concerned about what medications to stop prior to colonoscopy. In particular, her thyroid medication.  I discussed with the patient that she is not on any blood thinners or diabetic medications. Recommended she continue her prescribed dose of medications even on the day of her upcoming colonoscopy. Patient verbalized understanding and appreciated the advice. Recommended she call back if any more questions arise.

## 2023-03-05 ENCOUNTER — Other Ambulatory Visit (HOSPITAL_COMMUNITY): Payer: Self-pay

## 2023-03-05 MED ORDER — ESTRADIOL 0.1 MG/GM VA CREA
TOPICAL_CREAM | VAGINAL | 1 refills | Status: DC
  Filled 2023-03-05: qty 42.5, 30d supply, fill #0

## 2023-03-05 NOTE — Telephone Encounter (Signed)
Thank you-she is on my hospital outpatient endoscopy scheduled for this week.  HD

## 2023-03-06 ENCOUNTER — Other Ambulatory Visit (HOSPITAL_COMMUNITY): Payer: Self-pay

## 2023-03-07 ENCOUNTER — Other Ambulatory Visit (HOSPITAL_BASED_OUTPATIENT_CLINIC_OR_DEPARTMENT_OTHER): Payer: Self-pay

## 2023-03-07 ENCOUNTER — Other Ambulatory Visit (HOSPITAL_COMMUNITY): Payer: Self-pay

## 2023-03-07 NOTE — Anesthesia Preprocedure Evaluation (Addendum)
Anesthesia Evaluation  Patient identified by MRN, date of birth, ID band Patient awake    Reviewed: Allergy & Precautions, NPO status , Patient's Chart, lab work & pertinent test results, reviewed documented beta blocker date and time   History of Anesthesia Complications (+) PONV  Airway Mallampati: II  TM Distance: >3 FB Neck ROM: Full    Dental  (+) Dental Advisory Given   Pulmonary asthma , COPD,  COPD inhaler, former smoker   breath sounds clear to auscultation       Cardiovascular hypertension, Pt. on medications and Pt. on home beta blockers (-) angina  Rhythm:Regular Rate:Normal  '23 ECHO: EF 65-70%, normal LVF, Grade 1 DD, normal RVF, no significant valvular abnormalities   Neuro/Psych  Headaches  Anxiety Depression       GI/Hepatic Neg liver ROS,GERD  Medicated and Controlled,,  Endo/Other  Hypothyroidism    Renal/GU negative Renal ROS     Musculoskeletal   Abdominal   Peds  Hematology negative hematology ROS (+)   Anesthesia Other Findings   Reproductive/Obstetrics                             Anesthesia Physical Anesthesia Plan  ASA: 3  Anesthesia Plan: MAC   Post-op Pain Management: Minimal or no pain anticipated   Induction:   PONV Risk Score and Plan: 3 and Treatment may vary due to age or medical condition  Airway Management Planned: Nasal Cannula and Natural Airway  Additional Equipment: None  Intra-op Plan:   Post-operative Plan:   Informed Consent: I have reviewed the patients History and Physical, chart, labs and discussed the procedure including the risks, benefits and alternatives for the proposed anesthesia with the patient or authorized representative who has indicated his/her understanding and acceptance.     Dental advisory given  Plan Discussed with: CRNA and Surgeon  Anesthesia Plan Comments:         Anesthesia Quick Evaluation

## 2023-03-08 ENCOUNTER — Ambulatory Visit (HOSPITAL_BASED_OUTPATIENT_CLINIC_OR_DEPARTMENT_OTHER): Payer: Medicare HMO | Admitting: Anesthesiology

## 2023-03-08 ENCOUNTER — Ambulatory Visit (HOSPITAL_COMMUNITY): Payer: Medicare HMO | Admitting: Anesthesiology

## 2023-03-08 ENCOUNTER — Encounter (HOSPITAL_COMMUNITY): Payer: Self-pay | Admitting: Gastroenterology

## 2023-03-08 ENCOUNTER — Other Ambulatory Visit (HOSPITAL_COMMUNITY): Payer: Self-pay

## 2023-03-08 ENCOUNTER — Encounter (HOSPITAL_COMMUNITY)
Admission: RE | Disposition: A | Discharge status: Home / Self Care | Payer: Self-pay | Source: Home / Self Care | Attending: Gastroenterology

## 2023-03-08 ENCOUNTER — Other Ambulatory Visit: Payer: Self-pay

## 2023-03-08 ENCOUNTER — Ambulatory Visit (HOSPITAL_COMMUNITY)
Admission: RE | Admit: 2023-03-08 | Discharge: 2023-03-08 | Disposition: A | Discharge status: Home / Self Care | Payer: Medicare HMO | Attending: Gastroenterology | Admitting: Gastroenterology

## 2023-03-08 DIAGNOSIS — Z1211 Encounter for screening for malignant neoplasm of colon: Secondary | ICD-10-CM | POA: Diagnosis not present

## 2023-03-08 DIAGNOSIS — Q438 Other specified congenital malformations of intestine: Secondary | ICD-10-CM | POA: Diagnosis not present

## 2023-03-08 DIAGNOSIS — Z860101 Personal history of adenomatous and serrated colon polyps: Secondary | ICD-10-CM | POA: Diagnosis not present

## 2023-03-08 DIAGNOSIS — K5730 Diverticulosis of large intestine without perforation or abscess without bleeding: Secondary | ICD-10-CM | POA: Insufficient documentation

## 2023-03-08 DIAGNOSIS — Z87891 Personal history of nicotine dependence: Secondary | ICD-10-CM | POA: Insufficient documentation

## 2023-03-08 DIAGNOSIS — J4489 Other specified chronic obstructive pulmonary disease: Secondary | ICD-10-CM | POA: Insufficient documentation

## 2023-03-08 DIAGNOSIS — I10 Essential (primary) hypertension: Secondary | ICD-10-CM | POA: Diagnosis not present

## 2023-03-08 DIAGNOSIS — Z860100 Personal history of colon polyps, unspecified: Secondary | ICD-10-CM

## 2023-03-08 DIAGNOSIS — I2510 Atherosclerotic heart disease of native coronary artery without angina pectoris: Secondary | ICD-10-CM | POA: Diagnosis not present

## 2023-03-08 HISTORY — PX: COLONOSCOPY WITH PROPOFOL: SHX5780

## 2023-03-08 SURGERY — COLONOSCOPY WITH PROPOFOL
Anesthesia: Monitor Anesthesia Care

## 2023-03-08 MED ORDER — LIDOCAINE 2% (20 MG/ML) 5 ML SYRINGE
INTRAMUSCULAR | Status: DC | PRN
  Administered 2023-03-08: 80 mg via INTRAVENOUS

## 2023-03-08 MED ORDER — PROPOFOL 10 MG/ML IV BOLUS
INTRAVENOUS | Status: DC | PRN
  Administered 2023-03-08: 30 mg via INTRAVENOUS
  Administered 2023-03-08 (×2): 50 mg via INTRAVENOUS

## 2023-03-08 MED ORDER — SODIUM CHLORIDE 0.9 % IV SOLN: INTRAVENOUS | Status: DC

## 2023-03-08 MED ORDER — PROPOFOL 500 MG/50ML IV EMUL
INTRAVENOUS | Status: DC | PRN
  Administered 2023-03-08: 100 ug/kg/min via INTRAVENOUS

## 2023-03-08 SURGICAL SUPPLY — 22 items
ELECT REM PT RETURN 9FT ADLT (ELECTROSURGICAL)
ELECTRODE REM PT RTRN 9FT ADLT (ELECTROSURGICAL) IMPLANT
FCP BXJMBJMB 240X2.8X (CUTTING FORCEPS)
FLOOR PAD 36X40 (MISCELLANEOUS) ×1
FORCEPS BIOP RAD 4 LRG CAP 4 (CUTTING FORCEPS) IMPLANT
FORCEPS BIOP RJ4 240 W/NDL (CUTTING FORCEPS)
FORCEPS BXJMBJMB 240X2.8X (CUTTING FORCEPS) IMPLANT
INJECTOR/SNARE I SNARE (MISCELLANEOUS) IMPLANT
LUBRICANT JELLY 4.5OZ STERILE (MISCELLANEOUS) IMPLANT
MANIFOLD NEPTUNE II (INSTRUMENTS) IMPLANT
NDL SCLEROTHERAPY 25GX240 (NEEDLE) IMPLANT
NEEDLE SCLEROTHERAPY 25GX240 (NEEDLE)
PAD FLOOR 36X40 (MISCELLANEOUS) ×2 IMPLANT
PROBE APC STR FIRE (PROBE) IMPLANT
PROBE INJECTION GOLD (MISCELLANEOUS)
PROBE INJECTION GOLD 7FR (MISCELLANEOUS) IMPLANT
SNARE ROTATE MED OVAL 20MM (MISCELLANEOUS) IMPLANT
SYR 50ML LL SCALE MARK (SYRINGE) IMPLANT
TRAP SPECIMEN MUCOUS 40CC (MISCELLANEOUS) IMPLANT
TUBING ENDO SMARTCAP PENTAX (MISCELLANEOUS) IMPLANT
TUBING IRRIGATION ENDOGATOR (MISCELLANEOUS) ×2 IMPLANT
WATER STERILE IRR 1000ML POUR (IV SOLUTION) IMPLANT
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
## 2023-03-08 NOTE — Transfer of Care (Signed)
Immediate Anesthesia Transfer of Care Note  Patient: [name]  Procedure(s) Performed: COLONOSCOPY WITH PROPOFOL  Patient Location: PACU  Anesthesia Type:MAC  Level of Consciousness: awake, alert , and oriented  Airway & Oxygen Therapy: Patient Spontanous Breathing and Patient connected to nasal cannula oxygen  Post-op Assessment: Report given to RN and Post -op Vital signs reviewed and stable  Post vital signs: Reviewed and stable  Last Vitals:  Vitals Value Taken Time  BP 113/65 03/08/23 0809  Temp    Pulse 66 03/08/23 0811  Resp    SpO2 100 % 03/08/23 0811  Vitals shown include unfiled device data.  Last Pain:  Vitals:   03/08/23 0655  TempSrc: Temporal  PainSc: 0-No pain         Complications: No notable events documented.

## 2023-03-08 NOTE — Op Note (Signed)
[facility] Patient Name: [name] Procedure Date : 03/08/2023 MRN: [id number] Attending MD: Starr Lake. Myrtie Neither , MD, [id number] Date of Birth: Apr 16, 1956 CSN: [id number] Age: 67 Admit Type: Outpatient Procedure:                Colonoscopy Indications:              Surveillance: Personal history of adenomatous                            polyps on last colonoscopy 5 years ago                           Diminutive tubular adenoma x 2 in March 2019                           Diminutive tubular adenoma June 2013                           (Procedure being performed in hospital outpatient                            endoscopy lab due to history of difficult                            intubation) Providers:                Sherilyn Cooter L. Myrtie Neither, MD, Suzy Bouchard, RN, Weston Settle, RN, Rozetta Nunnery, Technician Referring MD:             Corwin Levins, MD Medicines:                Monitored Anesthesia Care Complications:            No immediate complications. Estimated Blood Loss:     Estimated blood loss: none. Procedure:                Pre-Anesthesia Assessment:                           - Prior to the procedure, a History and Physical                            was performed, and patient medications and                            allergies were reviewed. The patient's tolerance of                            previous anesthesia was also reviewed. The risks                            and benefits of the procedure and the sedation                            options and risks  were discussed with the patient.                            All questions were answered, and informed consent                            was obtained. Prior Anticoagulants: The patient has                            taken no anticoagulant or antiplatelet agents. ASA                            Grade Assessment: II - A patient with mild systemic                            disease. After  reviewing the risks and benefits,                            the patient was deemed in satisfactory condition to                            undergo the procedure.                           After obtaining informed consent, the colonoscope                            was passed under direct vision. Throughout the                            procedure, the patient's blood pressure, pulse, and                            oxygen saturations were monitored continuously. The                            [id number]-HQ190TL ([id number] Olympus peds colonoscope was                            introduced through the anus and advanced to the the                            cecum, identified by appendiceal orifice and                            ileocecal valve. The colonoscopy was somewhat                            difficult due to a redundant colon and significant                            looping. Successful completion of the procedure was  aided by using manual pressure and straightening                            and shortening the scope to obtain bowel loop                            reduction. The patient tolerated the procedure                            well. The quality of the bowel preparation was                            excellent. The ileocecal valve, appendiceal                            orifice, and rectum were photographed. Scope In: 7:50:16 AM Scope Out: 8:04:13 AM Scope Withdrawal Time: 0 hours 8 minutes 27 seconds  Total Procedure Duration: 0 hours 13 minutes 57 seconds  Findings:      The perianal and digital rectal examinations were normal.      Repeat examination of right colon under NBI performed.      Multiple diverticula were found in the left colon.      The colon (entire examined portion) was redundant.      The exam was otherwise without abnormality on direct and retroflexion       views. Impression:               - Diverticulosis in the left colon.                            - Redundant colon.                           - The examination was otherwise normal on direct                            and retroflexion views.                           - No specimens collected. Recommendation:           - Patient has a contact number available for                            emergencies. The signs and symptoms of potential                            delayed complications were discussed with the                            patient. Return to normal activities tomorrow.                            Written discharge instructions were provided to the  patient.                           - Resume previous diet.                           - Continue present medications.                           - Repeat colonoscopy in 10 years for screening                            purposes. Procedure Code(s):        --- Professional ---                           B1478, Colorectal cancer screening; colonoscopy on                            individual at high risk Diagnosis Code(s):        --- Professional ---                           Z86.010, Personal history of colonic polyps CPT copyright 2022 American Medical Association. All rights reserved. The codes documented in this report are preliminary and upon coder review may  be revised to meet current compliance requirements. Nai Dasch L. Myrtie Neither, MD 03/08/2023 8:09:32 AM This report has been signed electronically. Number of Addenda: 0

## 2023-03-08 NOTE — Anesthesia Postprocedure Evaluation (Signed)
Anesthesia Post Note  Patient: [name]  Procedure(s) Performed: COLONOSCOPY WITH PROPOFOL     Patient location during evaluation: Endoscopy Anesthesia Type: MAC Level of consciousness: awake and alert, patient cooperative and oriented Pain management: pain level controlled Vital Signs Assessment: post-procedure vital signs reviewed and stable Respiratory status: spontaneous breathing, nonlabored ventilation and respiratory function stable Cardiovascular status: blood pressure returned to baseline and stable Postop Assessment: no apparent nausea or vomiting Anesthetic complications: no   There were no known notable events for this encounter.  Last Vitals:  Vitals:   03/08/23 0815 03/08/23 0824  BP: 121/70 120/74  Pulse: 71 68  Resp: 10 14  Temp:  36.5 C  SpO2: 100% 100%    Last Pain:  Vitals:   03/08/23 0824  TempSrc:   PainSc: 0-No pain                 Corinne Goucher,E. Shoshannah Faubert

## 2023-03-08 NOTE — Interval H&P Note (Signed)
History and Physical Interval Note:  03/08/2023 7:18 AM  [name]  has presented today for surgery, with the diagnosis of hx of colon polyps.  The various methods of treatment have been discussed with the patient and family. After consideration of risks, benefits and other options for treatment, the patient has consented to  Procedure(s): COLONOSCOPY WITH PROPOFOL (N/A) as a surgical intervention.  The patient's history has been reviewed, patient examined, no change in status, stable for surgery.  I have reviewed the patient's chart and labs.  Questions were answered to the patient's satisfaction.     Charlie Pitter III

## 2023-03-09 ENCOUNTER — Encounter (HOSPITAL_COMMUNITY): Payer: Self-pay | Admitting: Gastroenterology

## 2023-03-09 ENCOUNTER — Other Ambulatory Visit (HOSPITAL_COMMUNITY): Payer: Self-pay

## 2023-03-10 ENCOUNTER — Other Ambulatory Visit (HOSPITAL_COMMUNITY): Payer: Self-pay

## 2023-03-19 ENCOUNTER — Other Ambulatory Visit: Payer: Self-pay | Admitting: Cardiology

## 2023-03-24 DIAGNOSIS — R300 Dysuria: Secondary | ICD-10-CM | POA: Diagnosis not present

## 2023-03-24 DIAGNOSIS — N390 Urinary tract infection, site not specified: Secondary | ICD-10-CM | POA: Diagnosis not present

## 2023-03-26 ENCOUNTER — Ambulatory Visit (INDEPENDENT_AMBULATORY_CARE_PROVIDER_SITE_OTHER): Payer: Medicare HMO | Admitting: Internal Medicine

## 2023-03-26 ENCOUNTER — Encounter: Payer: Self-pay | Admitting: Internal Medicine

## 2023-03-26 ENCOUNTER — Other Ambulatory Visit: Payer: Self-pay

## 2023-03-26 ENCOUNTER — Other Ambulatory Visit (HOSPITAL_COMMUNITY): Payer: Self-pay

## 2023-03-26 VITALS — BP 122/70 | HR 76 | Temp 98.2°F | Ht 62.0 in | Wt 191.0 lb

## 2023-03-26 DIAGNOSIS — G4700 Insomnia, unspecified: Secondary | ICD-10-CM | POA: Diagnosis not present

## 2023-03-26 DIAGNOSIS — R7302 Impaired glucose tolerance (oral): Secondary | ICD-10-CM | POA: Diagnosis not present

## 2023-03-26 DIAGNOSIS — E785 Hyperlipidemia, unspecified: Secondary | ICD-10-CM | POA: Diagnosis not present

## 2023-03-26 DIAGNOSIS — R319 Hematuria, unspecified: Secondary | ICD-10-CM | POA: Diagnosis not present

## 2023-03-26 DIAGNOSIS — E538 Deficiency of other specified B group vitamins: Secondary | ICD-10-CM | POA: Diagnosis not present

## 2023-03-26 DIAGNOSIS — E559 Vitamin D deficiency, unspecified: Secondary | ICD-10-CM

## 2023-03-26 DIAGNOSIS — F411 Generalized anxiety disorder: Secondary | ICD-10-CM | POA: Diagnosis not present

## 2023-03-26 DIAGNOSIS — E039 Hypothyroidism, unspecified: Secondary | ICD-10-CM

## 2023-03-26 DIAGNOSIS — N390 Urinary tract infection, site not specified: Secondary | ICD-10-CM | POA: Diagnosis not present

## 2023-03-26 DIAGNOSIS — F32A Depression, unspecified: Secondary | ICD-10-CM

## 2023-03-26 LAB — LIPID PANEL
Cholesterol: 121 mg/dL (ref 0–200)
HDL: 60.2 mg/dL (ref >39.00)
LDL Cholesterol: 48 mg/dL (ref 0–99)
NonHDL: 60.81
Total CHOL/HDL Ratio: 2
Triglycerides: 64 mg/dL (ref 0.0–149.0)
VLDL: 12.8 mg/dL (ref 0.0–40.0)

## 2023-03-26 LAB — VITAMIN B12: Vitamin B-12: 392 pg/mL (ref 211–911)

## 2023-03-26 LAB — URINALYSIS, ROUTINE W REFLEX MICROSCOPIC
Bilirubin Urine: NEGATIVE
Hgb urine dipstick: NEGATIVE
Ketones, ur: NEGATIVE
Nitrite: NEGATIVE
RBC / HPF: NONE SEEN (ref 0–?)
Specific Gravity, Urine: 1.01 (ref 1.000–1.030)
Total Protein, Urine: NEGATIVE
Urine Glucose: NEGATIVE
Urobilinogen, UA: 0.2 (ref 0.0–1.0)
pH: 7 (ref 5.0–8.0)

## 2023-03-26 LAB — BASIC METABOLIC PANEL
BUN: 9 mg/dL (ref 6–23)
CO2: 28 meq/L (ref 19–32)
Calcium: 9.6 mg/dL (ref 8.4–10.5)
Chloride: 104 meq/L (ref 96–112)
Creatinine, Ser: 0.78 mg/dL (ref 0.40–1.20)
GFR: 79.71 mL/min (ref >60.00)
Glucose, Bld: 65 mg/dL — ABNORMAL LOW (ref 70–99)
Potassium: 3.5 meq/L (ref 3.5–5.1)
Sodium: 140 meq/L (ref 135–145)

## 2023-03-26 LAB — CBC WITH DIFFERENTIAL/PLATELET
Basophils Absolute: 0.1 10*3/uL (ref 0.0–0.1)
Basophils Relative: 1 % (ref 0.0–3.0)
Eosinophils Absolute: 0.3 10*3/uL (ref 0.0–0.7)
Eosinophils Relative: 5.5 % — ABNORMAL HIGH (ref 0.0–5.0)
HCT: 42.1 % (ref 36.0–46.0)
Hemoglobin: 14.2 g/dL (ref 12.0–15.0)
Lymphocytes Relative: 20.8 % (ref 12.0–46.0)
Lymphs Abs: 1.1 10*3/uL (ref 0.7–4.0)
MCHC: 33.8 g/dL (ref 30.0–36.0)
MCV: 92.9 fL (ref 78.0–100.0)
Monocytes Absolute: 0.5 10*3/uL (ref 0.1–1.0)
Monocytes Relative: 8.9 % (ref 3.0–12.0)
Neutro Abs: 3.4 10*3/uL (ref 1.4–7.7)
Neutrophils Relative %: 63.8 % (ref 43.0–77.0)
Platelets: 238 10*3/uL (ref 150.0–400.0)
RBC: 4.53 Mil/uL (ref 3.87–5.11)
RDW: 13.6 % (ref 11.5–15.5)
WBC: 5.4 10*3/uL (ref 4.0–10.5)

## 2023-03-26 LAB — MICROALBUMIN / CREATININE URINE RATIO
Creatinine,U: 45.8 mg/dL
Microalb Creat Ratio: 1.5 mg/g (ref 0.0–30.0)
Microalb, Ur: <0.7 mg/dL (ref 0.0–1.9)

## 2023-03-26 LAB — HEPATIC FUNCTION PANEL
ALT: 19 U/L (ref 0–35)
AST: 21 U/L (ref 0–37)
Albumin: 4.2 g/dL (ref 3.5–5.2)
Alkaline Phosphatase: 80 U/L (ref 39–117)
Bilirubin, Direct: 0.1 mg/dL (ref 0.0–0.3)
Total Bilirubin: 0.4 mg/dL (ref 0.2–1.2)
Total Protein: 7.3 g/dL (ref 6.0–8.3)

## 2023-03-26 LAB — HEMOGLOBIN A1C: Hgb A1c MFr Bld: 5.8 % (ref 4.6–6.5)

## 2023-03-26 LAB — VITAMIN D 25 HYDROXY (VIT D DEFICIENCY, FRACTURES): VITD: 60.78 ng/mL (ref 30.00–100.00)

## 2023-03-26 MED ORDER — AMITRIPTYLINE HCL 150 MG PO TABS
150.0000 mg | ORAL_TABLET | Freq: Every day | ORAL | 1 refills | Status: DC
  Filled 2023-03-26: qty 90, 90d supply, fill #0
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
## 2023-03-26 NOTE — Patient Instructions (Signed)
Please take all new medication as prescribed - the elavil instead of the ambien  Please continue all other medications as before, and refills have been done if requested.  Please have the pharmacy call with any other refills you may need.  Please continue your efforts at being more active, low cholesterol diet, and weight control.  Please keep your appointments with your specialists as you may have planned  You will be contacted regarding the referral for: counseling  Please go to the LAB at the blood drawing area for the tests to be done  You will be contacted by phone if any changes need to be made immediately.  Otherwise, you will receive a letter about your results with an explanation, but please check with MyChart first.  Please make an Appointment to return in 6 months, or sooner if needed

## 2023-03-27 ENCOUNTER — Ambulatory Visit (INDEPENDENT_AMBULATORY_CARE_PROVIDER_SITE_OTHER): Payer: Medicare HMO | Admitting: Allergy & Immunology

## 2023-03-27 ENCOUNTER — Encounter: Payer: Self-pay | Admitting: Internal Medicine

## 2023-03-27 ENCOUNTER — Encounter: Payer: Self-pay | Admitting: Allergy & Immunology

## 2023-03-27 VITALS — BP 96/62 | HR 75 | Temp 100.2°F | Resp 14 | Ht 62.4 in | Wt 190.9 lb

## 2023-03-27 DIAGNOSIS — J4540 Moderate persistent asthma, uncomplicated: Secondary | ICD-10-CM

## 2023-03-27 DIAGNOSIS — L508 Other urticaria: Secondary | ICD-10-CM | POA: Diagnosis not present

## 2023-03-27 DIAGNOSIS — N390 Urinary tract infection, site not specified: Secondary | ICD-10-CM | POA: Insufficient documentation

## 2023-03-27 DIAGNOSIS — L299 Pruritus, unspecified: Secondary | ICD-10-CM | POA: Diagnosis not present

## 2023-03-27 LAB — THYROID PANEL WITH TSH
Free Thyroxine Index: 2.1 (ref 1.4–3.8)
T3 Uptake: 26 % (ref 22–35)
T4, Total: 8.1 ug/dL (ref 5.1–11.9)
TSH: 4.36 m[IU]/L (ref 0.40–4.50)

## 2023-03-27 NOTE — Progress Notes (Signed)
The test results show that your current treatment is OK, as the tests are stable.  Please continue the same plan.  There is no other need for change of treatment or further evaluation based on these results, at this time.  thanks The test results show that your current treatment is OK, as the tests are stable.  Please continue the same plan.  There is no other need for change of treatment or further evaluation based on these results, at this time.  thanks

## 2023-03-27 NOTE — Assessment & Plan Note (Signed)
With mild worsening per pt, declines SSRI but ok for counseling

## 2023-03-27 NOTE — Assessment & Plan Note (Signed)
Pt to continue atarax tid prn

## 2023-03-27 NOTE — Assessment & Plan Note (Signed)
Lab Results  Component Value Date   LDLCALC 48 03/26/2023   Stable, pt to continue current statin crestor 40 mg qd

## 2023-03-27 NOTE — Assessment & Plan Note (Signed)
Ok for change ambien at bedtime to elavil 150 at bedtime prn

## 2023-03-27 NOTE — Progress Notes (Signed)
FOLLOW UP  Date of Service/Encounter:  03/27/23   Assessment:   Pruritis - with sensitization to dust mite    Chronic urticaria - doing better on Xolair every 2 weeks (administered at home)   Mild eosinophilia (AEC 500)   Moderate persistent asthma, uncomplicated    Plan/Recommendations:   1. Moderate intermittent asthma, uncomplicated - Lung testing looks excellent.  - I think we are doing well from from a breathing perspective.  - We are not going to make any changes at this point in time. - Daily controller medication(s): Wixela 250/50mg  one puff TWICE daily - Rescue medications: albuterol 4 puffs every 4-6 hours as needed WITH COUGHING/WHEEZING - Asthma control goals:  * Full participation in all desired activities (may need albuterol before activity) * Albuterol use two time or less a week on average (not counting use with activity) * Cough interfering with sleep two time or less a month * Oral steroids no more than once a year * No hospitalizations  2. Pruritus with urticaria - Continue Xolair every 2 weeks. - Continue to follow with Dermatology.  - Release of information form provided so we can see exactly what they did.  - Continue with triamcinolone twice daily as needed.  - You can continue with the antihistamines:   AM: Pepcid (famotidine) + hydroxyzine 10mg    NOON and as needed: Claritin (loratadine) 1-2 times daily  PM: Pepcid (famotidine) + hydroxyzine 10mg    3. Return in about 6 months or earlier if needed.    Subjective:   [name] is a 67 y.o. female presenting today for follow up of  Chief Complaint  Patient presents with   Asthma    Says she has had to use her inhaler quite often the past few weeks due to flares.     [name] has a history of the following: Patient Active Problem List   Diagnosis Date Noted   Wrist pain, acute, left 11/07/2022   Neck pain, bilateral 11/07/2022   Fall 11/07/2022   Chronic pain of both  shoulders 11/07/2022   Cough 10/26/2021   Wheezing 10/26/2021   Elevated coronary artery calcium score 07/30/2021   Vitamin D deficiency 06/11/2021   Dysuria 08/31/2020   Chronic pruritus 05/30/2020   Rash 04/03/2020   Chest pain 11/14/2019   Insomnia 11/14/2019   Pelvic prolapse 10/06/2019   Leg pain, bilateral 05/01/2019   Disease of vein 05/01/2019   Acute diverticulitis 12/23/2018   Diverticulitis 12/09/2018   Asthma 10/02/2018   Pain in right hand 07/12/2018   Pain in both feet 11/08/2017   Nodule of chest wall 10/11/2017   Mass of right side of neck 10/11/2017   Varicose veins of bilateral lower extremities with other complications 08/30/2017   Complication of anesthesia    Right groin pain 04/21/2017   Hyperlipidemia LDL goal <100 03/23/2017   Dorsalgia 03/01/2017   Rapid palpitations 03/01/2017   Preop cardiovascular exam 03/01/2017   Right lumbar radiculopathy 08/15/2016   Rib pain on right side 03/03/2016   Nosebleed 03/03/2016   Acute colitis 08/30/2013   GERD (gastroesophageal reflux disease) 08/14/2012   Pain and swelling of lower leg 10/23/2011   Depression 07/10/2011   History of renal stone 04/15/2011   Pleural effusion 04/14/2011   Hypokalemia 04/14/2011   Chronic low back pain 03/29/2011   Obesity 03/29/2011   Encounter for well adult exam with abnormal findings 12/30/2010   THYROID NODULE, RIGHT 02/21/2010   NECK PAIN, RIGHT 02/21/2010  Bilateral lower extremity edema 02/21/2010   Atony of bladder 11/10/2009   Fatigue 03/11/2009   SINUSITIS, CHRONIC 11/27/2008   Hypothyroidism 07/19/2007   Anxiety state 07/19/2007   Allergic rhinitis 07/19/2007   DIVERTICULOSIS, COLON 07/19/2007   DEGENERATIVE DISC DISEASE 07/19/2007   Hx of colonic polyps 07/19/2007    History obtained from: chart review and patient.  Discussed the use of AI scribe software for clinical note transcription with the patient and/or guardian, who gave verbal consent to  proceed.  [name] is a 67 y.o. female presenting for a follow up visit.  She was last seen in June 2024.  At that time, lung testing was not done.  We continued with Wixela 250 mcg 1 puff once daily as well as albuterol as needed.  For her pruritus with urticaria, we continue with Xolair every 2 weeks.  She was very worried about coverage of her Xolair with transitioning to Medicare.  We did refer her to see a dermatologist in case she had something more severe than urticaria going on.  We continued with her antihistamines including Pepcid and hydroxyzine in the morning, Claritin 1-2 times daily as needed, and Pepcid and hydroxyzine at night.  We added on clobetasol to use twice daily for 1 to 2 weeks until lesions have completely healed.  We did discuss starting Dupixent for prurigo nodularis, but she wanted to discuss with the dermatologist first.  In the meantime, she has done fairly well.  Asthma/Respiratory Symptom History: [name] reports a recent increase in asthma symptoms, necessitating the use of their rescue inhaler. Despite these symptoms, the patient confirms adherence to their Wixela inhaler, taken twice daily, which they report as helpful.  She has not been on prednisone for her breathing.  She has not been to the emergency room for her breathing issues.  Allergic Rhinitis Symptom History: They describe a dry nose, attributing it to the heat and dry air, and have experienced nosebleeds that are more severe than usual.   Skin Symptom History: The patient is also receiving Xolair injections every two weeks for their dermatological condition. They recently visited a dermatologist who injected their lesions, leading to significant improvement. However, they note the development of new lesions on their leg. They have been using a prescribed cream, which they find beneficial.  She does not think that they did a biopsy.  This steroid injection seemed to have helped a number of the lesions, but  she has some that remain on her left thigh.  She gives herself the Xolair at home and she gets the Xolair from an outside pharmacy.  She said that refilling it has become a problem.  The patient also reports changes in their insurance, which has caused some confusion and difficulty in obtaining their medications.  She is changing to a [employer] in January 2025.  Otherwise, there have been no changes to her past medical history, surgical history, family history, or social history.    Review of systems otherwise negative other than that mentioned in the HPI.    Objective:   Blood pressure 96/62, pulse 75, temperature 100.2 F (37.9 C), temperature source Temporal, resp. rate 14, height 5' 2.4" (1.585 m), weight 190 lb 14.4 oz (86.6 kg), SpO2 97%. Body mass index is 34.47 kg/m.    Physical Exam Vitals reviewed.  Constitutional:      Appearance: She is well-developed.     Comments: Pleasant.  Cooperative.  HENT:     Head: Normocephalic and atraumatic.  Right Ear: Tympanic membrane, ear canal and external ear normal.     Left Ear: Tympanic membrane, ear canal and external ear normal.     Nose: No nasal deformity, septal deviation, mucosal edema or rhinorrhea.     Right Turbinates: Enlarged, swollen and pale.     Left Turbinates: Enlarged, swollen and pale.     Right Sinus: No maxillary sinus tenderness or frontal sinus tenderness.     Left Sinus: No maxillary sinus tenderness or frontal sinus tenderness.     Comments: No nasal polyps.    Mouth/Throat:     Mouth: Mucous membranes are not pale and not dry.     Pharynx: Uvula midline.  Eyes:     General: Lids are normal. No allergic shiner.       Right eye: No discharge.        Left eye: No discharge.     Conjunctiva/sclera: Conjunctivae normal.     Right eye: Right conjunctiva is not injected. No chemosis.    Left eye: Left conjunctiva is not injected. No chemosis.    Pupils: Pupils are equal, round, and  reactive to light.  Cardiovascular:     Rate and Rhythm: Normal rate and regular rhythm.     Heart sounds: Normal heart sounds.  Pulmonary:     Effort: Pulmonary effort is normal. No tachypnea, accessory muscle usage or respiratory distress.     Breath sounds: Normal breath sounds. No wheezing, rhonchi or rales.     Comments: Moving air well in all lung fields. Chest:     Chest wall: No tenderness.  Lymphadenopathy:     Cervical: No cervical adenopathy.  Skin:    General: Skin is warm.     Capillary Refill: Capillary refill takes less than 2 seconds.     Coloration: Skin is not pale.     Findings: Lesion present. No abrasion, erythema, petechiae or rash. Rash is not papular, urticarial or vesicular.     Comments: Overall, skin looks much better.  She has some lesions on her left arm with some eschar formation.   Neurological:     Mental Status: She is alert.  Psychiatric:        Behavior: Behavior is cooperative.      Diagnostic studies:    Spirometry: results normal (FEV1: 1.89/86%, FVC: 2.70/97%, FEV1/FVC: 70%).    Spirometry consistent with normal pattern.   Allergy Studies: none       Malachi Bonds, MD  [facility]

## 2023-03-27 NOTE — Assessment & Plan Note (Signed)
Last vitamin D Lab Results  Component Value Date   VD25OH 60.78 03/26/2023   Stable, cont oral replacement

## 2023-03-27 NOTE — Assessment & Plan Note (Addendum)
Likely by hx, to continue nitrofurantoin, f/u culture as cultrue results pending.,  if culture negative pt would need urology referral for further eval such as cystoscopy given the gross hematuria

## 2023-03-27 NOTE — Assessment & Plan Note (Addendum)
Lab Results  Component Value Date   TSH 4.36 03/26/2023   Stable, pt to continue levothyroxine 100/112 qod

## 2023-03-27 NOTE — Patient Instructions (Addendum)
1. Moderate intermittent asthma, uncomplicated - Lung testing looks excellent.  - I think we are doing well from from a breathing perspective.  - We are not going to make any changes at this point in time. - Daily controller medication(s): Wixela 250/50mg  one puff TWICE daily - Rescue medications: albuterol 4 puffs every 4-6 hours as needed WITH COUGHING/WHEEZING - Asthma control goals:  * Full participation in all desired activities (may need albuterol before activity) * Albuterol use two time or less a week on average (not counting use with activity) * Cough interfering with sleep two time or less a month * Oral steroids no more than once a year * No hospitalizations  2. Pruritus with urticaria - Continue Xolair every 2 weeks. - Continue to follow with Dermatology.  - Release of information form provided so we can see exactly what they did.  - Continue with triamcinolone twice daily as needed.  - You can continue with the antihistamines:   AM: Pepcid (famotidine) + hydroxyzine 10mg    NOON and as needed: Claritin (loratadine) 1-2 times daily  PM: Pepcid (famotidine) + hydroxyzine 10mg    3. Return in about 6 months or earlier if needed.     Please inform us of any Emergency Department visits, hospitalizations, or changes in symptoms. Call us before going to the ED for breathing or allergy symptoms since we might be able to fit you in for a sick visit. Feel free to contact us anytime with any questions, problems, or concerns.  It was a pleasure to see you again today!  Websites that have reliable patient information: 1. American Academy of Asthma, Allergy, and Immunology: www.aaaai.org 2. Food Allergy Research and Education (FARE): foodallergy.org 3. Mothers of Asthmatics: http://www.asthmacommunitynetwork.org 4. American College of Allergy, Asthma, and Immunology: www.acaai.org   COVID-19 Vaccine Information can be found at:  [email or web address].[email or web address] For questions related to vaccine distribution or appointments, please email [email or web address] or call [phone].   We realize that you might be concerned about having an allergic reaction to the COVID19 vaccines. To help with that concern, WE ARE OFFERING THE COVID19 VACCINES IN OUR OFFICE! Ask the front desk for dates!     "Like" [place] on Facebook and Instagram for our latest updates!      A healthy democracy works best when [employer] participate! Make sure you are registered to vote! If you have moved or changed any of your contact information, you will need to get this updated before voting!  In some cases, you MAY be able to register to vote online: [email or web address]

## 2023-03-29 ENCOUNTER — Telehealth: Payer: Self-pay | Admitting: Internal Medicine

## 2023-03-29 NOTE — Telephone Encounter (Signed)
Pt called wanted to know if this is the right dosage for this medication before take it. Please advise.  amitriptyline (ELAVIL) 150 MG tablet

## 2023-03-30 MED ORDER — AMITRIPTYLINE HCL 75 MG PO TABS: ORAL_TABLET | ORAL | 5 refills | Status: DC

## 2023-03-30 NOTE — Telephone Encounter (Signed)
See below

## 2023-03-30 NOTE — Telephone Encounter (Signed)
Called and left voicemail, please relay message when Pt calls back.

## 2023-03-30 NOTE — Telephone Encounter (Signed)
This is a higher dose, but I did change the script to 75 mg and take 1-2 at bedtime if she prefers to try this first - thanks

## 2023-04-06 ENCOUNTER — Telehealth: Payer: Self-pay | Admitting: Internal Medicine

## 2023-04-06 NOTE — Telephone Encounter (Signed)
Error

## 2023-04-11 ENCOUNTER — Other Ambulatory Visit: Payer: Self-pay | Admitting: Internal Medicine

## 2023-04-12 ENCOUNTER — Other Ambulatory Visit: Payer: Self-pay

## 2023-04-20 ENCOUNTER — Other Ambulatory Visit (HOSPITAL_COMMUNITY): Payer: Self-pay

## 2023-04-20 ENCOUNTER — Other Ambulatory Visit: Payer: Self-pay | Admitting: Obstetrics and Gynecology

## 2023-04-20 DIAGNOSIS — Z0000 Encounter for general adult medical examination without abnormal findings: Secondary | ICD-10-CM

## 2023-04-23 ENCOUNTER — Other Ambulatory Visit (HOSPITAL_COMMUNITY): Payer: Self-pay

## 2023-04-26 ENCOUNTER — Other Ambulatory Visit (HOSPITAL_COMMUNITY): Payer: Self-pay

## 2023-05-04 DIAGNOSIS — R3911 Hesitancy of micturition: Secondary | ICD-10-CM | POA: Diagnosis not present

## 2023-05-04 DIAGNOSIS — R310 Gross hematuria: Secondary | ICD-10-CM | POA: Diagnosis not present

## 2023-05-04 DIAGNOSIS — N3021 Other chronic cystitis with hematuria: Secondary | ICD-10-CM | POA: Diagnosis not present

## 2023-05-04 DIAGNOSIS — R350 Frequency of micturition: Secondary | ICD-10-CM | POA: Diagnosis not present

## 2023-05-21 ENCOUNTER — Other Ambulatory Visit: Payer: Self-pay

## 2023-05-21 ENCOUNTER — Other Ambulatory Visit (HOSPITAL_COMMUNITY): Payer: Self-pay

## 2023-05-22 ENCOUNTER — Other Ambulatory Visit (HOSPITAL_COMMUNITY): Payer: Self-pay

## 2023-05-22 DIAGNOSIS — L814 Other melanin hyperpigmentation: Secondary | ICD-10-CM | POA: Diagnosis not present

## 2023-05-22 DIAGNOSIS — L308 Other specified dermatitis: Secondary | ICD-10-CM | POA: Diagnosis not present

## 2023-05-22 DIAGNOSIS — L821 Other seborrheic keratosis: Secondary | ICD-10-CM | POA: Diagnosis not present

## 2023-05-22 DIAGNOSIS — N3021 Other chronic cystitis with hematuria: Secondary | ICD-10-CM | POA: Diagnosis not present

## 2023-05-22 DIAGNOSIS — D492 Neoplasm of unspecified behavior of bone, soft tissue, and skin: Secondary | ICD-10-CM | POA: Diagnosis not present

## 2023-05-22 DIAGNOSIS — D225 Melanocytic nevi of trunk: Secondary | ICD-10-CM | POA: Diagnosis not present

## 2023-05-22 DIAGNOSIS — R1084 Generalized abdominal pain: Secondary | ICD-10-CM | POA: Diagnosis not present

## 2023-05-22 MED ORDER — ONDANSETRON 4 MG PO TBDP
4.0000 mg | ORAL_TABLET | Freq: Four times a day (QID) | ORAL | 0 refills | Status: DC | PRN
  Filled 2023-05-22: qty 20, 5d supply, fill #0

## 2023-05-22 MED ORDER — CEFPODOXIME PROXETIL 200 MG PO TABS
200.0000 mg | ORAL_TABLET | Freq: Two times a day (BID) | ORAL | 0 refills | Status: DC
  Filled 2023-05-22: qty 14, 7d supply, fill #0

## 2023-06-01 ENCOUNTER — Encounter: Payer: Self-pay | Admitting: Allergy & Immunology

## 2023-06-01 ENCOUNTER — Other Ambulatory Visit (HOSPITAL_BASED_OUTPATIENT_CLINIC_OR_DEPARTMENT_OTHER): Payer: Self-pay

## 2023-06-04 ENCOUNTER — Ambulatory Visit
Admission: RE | Admit: 2023-06-04 | Discharge: 2023-06-04 | Disposition: A | Discharge status: Home / Self Care | Payer: Medicare Other | Source: Ambulatory Visit | Attending: Obstetrics and Gynecology | Admitting: Obstetrics and Gynecology

## 2023-06-04 DIAGNOSIS — Z1231 Encounter for screening mammogram for malignant neoplasm of breast: Secondary | ICD-10-CM | POA: Diagnosis not present

## 2023-06-04 DIAGNOSIS — Z0000 Encounter for general adult medical examination without abnormal findings: Secondary | ICD-10-CM

## 2023-06-04 NOTE — Telephone Encounter (Signed)
 I advised patient per [place] she is no longer eligible for Xolair  PAP due to income

## 2023-06-05 ENCOUNTER — Other Ambulatory Visit (HOSPITAL_COMMUNITY): Payer: Self-pay

## 2023-06-05 ENCOUNTER — Other Ambulatory Visit: Payer: Self-pay

## 2023-06-05 DIAGNOSIS — L309 Dermatitis, unspecified: Secondary | ICD-10-CM | POA: Diagnosis not present

## 2023-06-05 MED ORDER — TRIAMCINOLONE ACETONIDE 0.1 % EX OINT
TOPICAL_OINTMENT | Freq: Two times a day (BID) | CUTANEOUS | 1 refills | Status: DC | PRN
  Filled 2023-06-05: qty 80, 28d supply, fill #0
  Filled 2023-06-05: qty 80, 30d supply, fill #0

## 2023-06-14 DIAGNOSIS — H524 Presbyopia: Secondary | ICD-10-CM | POA: Diagnosis not present

## 2023-06-14 DIAGNOSIS — H52223 Regular astigmatism, bilateral: Secondary | ICD-10-CM | POA: Diagnosis not present

## 2023-06-14 DIAGNOSIS — H5203 Hypermetropia, bilateral: Secondary | ICD-10-CM | POA: Diagnosis not present

## 2023-06-14 DIAGNOSIS — H25813 Combined forms of age-related cataract, bilateral: Secondary | ICD-10-CM | POA: Diagnosis not present

## 2023-06-16 ENCOUNTER — Other Ambulatory Visit (HOSPITAL_COMMUNITY): Payer: Self-pay

## 2023-06-16 ENCOUNTER — Other Ambulatory Visit: Payer: Self-pay | Admitting: Internal Medicine

## 2023-06-17 ENCOUNTER — Other Ambulatory Visit (HOSPITAL_COMMUNITY): Payer: Self-pay

## 2023-06-18 ENCOUNTER — Other Ambulatory Visit: Payer: Self-pay

## 2023-06-19 ENCOUNTER — Other Ambulatory Visit: Payer: Self-pay | Admitting: Internal Medicine

## 2023-06-19 ENCOUNTER — Other Ambulatory Visit (HOSPITAL_COMMUNITY): Payer: Self-pay

## 2023-06-19 ENCOUNTER — Other Ambulatory Visit: Payer: Self-pay

## 2023-06-19 DIAGNOSIS — N210 Calculus in bladder: Secondary | ICD-10-CM | POA: Insufficient documentation

## 2023-06-19 MED ORDER — ESTRADIOL 0.1 MG/GM VA CREA
TOPICAL_CREAM | VAGINAL | 1 refills | Status: DC
Start: 2023-06-19 — End: 2023-09-12
  Filled 2023-06-19 (×2): qty 42.5, 30d supply, fill #0
  Filled 2023-07-16: qty 42.5, 30d supply, fill #1

## 2023-06-19 MED ORDER — PANTOPRAZOLE SODIUM 40 MG PO TBEC
40.0000 mg | DELAYED_RELEASE_TABLET | Freq: Every day | ORAL | 3 refills | Status: DC
  Filled 2023-06-19 (×2): qty 90, 90d supply, fill #0
  Filled 2023-09-22 – 2023-11-01 (×2): qty 90, 90d supply, fill #1

## 2023-06-19 MED ORDER — PIMECROLIMUS 1 % EX CREA: TOPICAL_CREAM | Freq: Two times a day (BID) | CUTANEOUS | 5 refills | Status: DC

## 2023-06-20 ENCOUNTER — Other Ambulatory Visit (HOSPITAL_COMMUNITY): Payer: Self-pay

## 2023-06-27 ENCOUNTER — Other Ambulatory Visit (HOSPITAL_COMMUNITY): Payer: Self-pay

## 2023-07-09 ENCOUNTER — Other Ambulatory Visit: Payer: Self-pay

## 2023-07-09 ENCOUNTER — Other Ambulatory Visit (HOSPITAL_COMMUNITY): Payer: Self-pay

## 2023-07-10 ENCOUNTER — Other Ambulatory Visit (HOSPITAL_COMMUNITY): Payer: Self-pay

## 2023-07-16 ENCOUNTER — Other Ambulatory Visit: Payer: Self-pay

## 2023-07-16 ENCOUNTER — Other Ambulatory Visit (HOSPITAL_COMMUNITY): Payer: Self-pay

## 2023-07-18 ENCOUNTER — Other Ambulatory Visit (HOSPITAL_COMMUNITY): Payer: Self-pay

## 2023-07-27 DIAGNOSIS — K5730 Diverticulosis of large intestine without perforation or abscess without bleeding: Secondary | ICD-10-CM | POA: Diagnosis not present

## 2023-07-27 DIAGNOSIS — R1084 Generalized abdominal pain: Secondary | ICD-10-CM | POA: Diagnosis not present

## 2023-07-27 DIAGNOSIS — Z9049 Acquired absence of other specified parts of digestive tract: Secondary | ICD-10-CM | POA: Diagnosis not present

## 2023-07-27 DIAGNOSIS — R1032 Left lower quadrant pain: Secondary | ICD-10-CM | POA: Diagnosis not present

## 2023-08-03 ENCOUNTER — Other Ambulatory Visit (HOSPITAL_COMMUNITY): Payer: Self-pay

## 2023-08-07 DIAGNOSIS — L309 Dermatitis, unspecified: Secondary | ICD-10-CM | POA: Diagnosis not present

## 2023-08-08 DIAGNOSIS — N3021 Other chronic cystitis with hematuria: Secondary | ICD-10-CM | POA: Diagnosis not present

## 2023-08-14 ENCOUNTER — Telehealth: Payer: Self-pay | Admitting: Internal Medicine

## 2023-08-14 NOTE — Telephone Encounter (Signed)
 Copied from CRM [id number]. Topic: Clinical - Medication Question >> Aug 14, 2023 10:31 AM Bambi Bonine D wrote: Reason for CRM: Patient stated that she accidentally took her morning and night medication this morning. Patient stated that she is not feeling weird or any different but wants to know what Dr.John advises her to do.

## 2023-08-14 NOTE — Telephone Encounter (Signed)
 I have reviewed the meds - there is little to no danger in accidetnly taking her pm meds in the am.   Ok to continue with PM meds tonight as per usual.

## 2023-08-15 ENCOUNTER — Other Ambulatory Visit: Payer: Self-pay | Admitting: Allergy & Immunology

## 2023-08-18 NOTE — Progress Notes (Unsigned)
 Cardiology Office Note:    Date:  08/22/2023   ID:  [name], DOB [birth year], MRN [id number]  PCP:  Roslyn Coombe, MD  Cardiologist:  Randene Bustard, MD     Referring MD: Roslyn Coombe, MD   Chief Complaint: follow-up of CAD and palpitations   History of Present Illness:    [name] is a 68 y.o. female with a history of mild nonobstructive CAD on coronary CTA in 07/2021, tachypalpitations with short runs of PAT and rare PACs/PVCs noted on monitor in 07/2021, varicose veins of bilateral lower extremities with chronic edema s/p ablation, hyperlipidemia, hypothyroidism, and mild asthma who is followed by Dr. Addie Holstein and presents today for routine follow-up.   Remote Echo in 2013 showed LVEF of 55-60% with normal wall motion and normal diastolic function. Coronary CTA in 02/2017 showed coronary calcium  score of 34 isolated to the proximal LAD (placing patient in the 82nd percentile for age and sex) with less than 30% calcified plaque in the proximal LAD.  Event monitor ordered in 02/2018 showed underlying sinus rhythm with occasional PACs/PVCs but no concerning arrhythmias. Repeat coronary CTA in 07/2021 for further evaluation of chest pain showed a coronary calcium  score of 59.1 (79th percentile for age and sex) and mild non-obstructive CAD with 25-49% stenosis of mid LAD. Echo in 07/2021 showed LVEF of 65-70% with normal wall motion and grade 1 diastolic dysfunction, normal RV function, and no significant valvular disease. Repeat Zio monitor was also performed in 07/2021 evaluation of worsening palpitations which showed runs of paroxysmal atrial tachycardia (longest run lasting 11.4 seconds) and rare PACs/PVCs but no significant arrhythmias.   She was last seen by Dr. Addie Holstein in 07/2022 at which time she reported occasional episode of shortness of breath and swelling at the end of the as well as intermittent episodes of heart racing. However, she rarely required extra doses of Lasix  or  Lopressor . Lopressor  was increased.  Patient presents today for follow-up. Here with husband.  She denies any chest pain.  She reports more dyspnea on exertion over the last month. She describes getting short of breath with mild activity such as walking up 3 steps to her house or walking a short distance to her garden. Dyspnea does improved sometimes with her inhaler. No dyspnea at rest. No orthopnea. She states she will rarely wake up in the middle of the night feeling short of breath but this sounds more due to her asthma than PND.  She also reports worsening lower extremity edema over the last month and states her fluid/weight has been fluctuating a lot.  Her weight will sometimes be up 3 to 5 pounds in 1 day and then go back down.  She states she tries to limit her salt intake.  She also wears compression stockings which she states work "okay but not great."  She also has been having worsening palpitations at night and has been taking her as needed Lopressor  almost every night for the last 3 weeks.  No significant palpitations during the day.  EKGs/Labs/Other Studies Reviewed:    The following studies were reviewed:  Echocardiogram 08/10/2021: Impressions:  1. Left ventricular ejection fraction, by estimation, is 65 to 70%. Left  ventricular ejection fraction by 3D volume is 69 %. The left ventricle has  normal function. The left ventricle has no regional wall motion  abnormalities. Left ventricular diastolic   parameters are consistent with Grade I diastolic dysfunction (impaired  relaxation). The average left ventricular global  longitudinal strain is  -21.9 %. The global longitudinal strain is normal.   2. Right ventricular systolic function is normal. The right ventricular  size is normal.   3. The mitral valve is grossly normal. Trivial mitral valve  regurgitation.   4. The aortic valve is tricuspid. Aortic valve regurgitation is not  visualized.   5. The inferior vena cava is normal in  size with greater than 50%  respiratory variability, suggesting right atrial pressure of 3 mmHg.   6. Cannot exclude PFO.  _______________   Coronary CTA 08/12/2021: Impressions: 1. Coronary calcium  score of 59.1. This was 53 percentile for age and sex matched control. 2. Normal coronary origin with right dominance. 3. CAD-RADS 2. Mild non-obstructive CAD (25-49%). Consider non-atherosclerotic causes of chest pain. Consider preventive therapy and risk factor modification. _______________   Aleda Hurl 08/07/2021 to 08/10/2021:   Predominant underlying rhythm: Sinus Rhythm-HR range 67-133 bpm, AVG 91 bpm.   Rare isolated PACs (with some couplets and triplets), rare isolated PVCs   5 Atrial Tachycardia (Nonsustained Paroxysmal Supraventricular Tachycardia) Runs: Fastest and longest-30 beats (11.4 sec) with average rate 157 bpm (range 100-197 BPM);   Only 2 triggered events were noted with 1 diary entry.  These were both in sinus rhythm.   No sustained tachycardia or bradycardia arrhythmias.  No pauses.  No sustained paroxysmal ventricular tachycardia, atrial flutter, atrial fibrillation.   Overall relatively benign findings with short 5 to 10-second bursts of premature beats. Nothing overly concerning noted on current monitor. Unfortunately, no significant symptoms are noted so is difficult to tell if we may potentially miss something.  EKG:  EKG ordered today.   EKG Interpretation Date/Time:  Wednesday August 22 2023 09:40:38 EDT Ventricular Rate:  77 PR Interval:  136 QRS Duration:  88 QT Interval:  398 QTC Calculation: 450 R Axis:   -9  Text Interpretation: Normal sinus rhythm Normal ECG No significant changes compared to prior tracings Confirmed by Sharren Decree [phone]) on 08/22/2023 9:47:01 AM    Recent Labs: 03/26/2023: ALT 19; BUN 9; Creatinine, Ser 0.78; Hemoglobin 14.2; Platelets 238.0; Potassium 3.5; Sodium 140; TSH 4.36  Recent Lipid Panel    Component Value Date/Time    CHOL 121 03/26/2023 1055   CHOL 120 08/15/2022 0838   TRIG 64.0 03/26/2023 1055   HDL 60.20 03/26/2023 1055   HDL 56 08/15/2022 0838   CHOLHDL 2 03/26/2023 1055   VLDL 12.8 03/26/2023 1055   LDLCALC 48 03/26/2023 1055   LDLCALC 47 08/15/2022 0838    Physical Exam:    Vital Signs: BP 118/76 (BP Location: Left Arm, Patient Position: Sitting)   Pulse 77   Ht 5\' 2"  (1.575 m)   Wt 197 lb (89.4 kg)   SpO2 98%   BMI 36.03 kg/m     Wt Readings from Last 3 Encounters:  08/22/23 197 lb (89.4 kg)  03/27/23 190 lb 14.4 oz (86.6 kg)  03/26/23 191 lb (86.6 kg)     General: 68 y.o. obese Caucasian female in no acute distress. HEENT: Normocephalic and atraumatic. Sclera clear.  Neck: Supple. No carotid bruits. No JVD. Heart: RRR. Distinct S1 and S2. No murmurs, gallops, or rubs.  Lungs: No increased work of breathing. Decreased breath sounds in bilateral bases. No wheezes, rhonchi, or rales.  Abdomen: Soft, non-distended, and non-tender to palpation.  Extremities: Mostly non-pitting edema of bilateral lower extremities. Wearing compression stockings. Skin: Warm and dry. Neuro: No focal deficits. Psych: Normal affect. Responds appropriately.   Assessment:  1. Dyspnea on exertion   2. Bilateral lower extremity edema   3. Palpitations   4. Paroxysmal atrial tachycardia (HCC)   5. Non-obstructive CAD   6. Hyperlipidemia, unspecified hyperlipidemia type     Plan:    Dyspne on Exertion Patient reports worsening dyspnea on exertion with mild activity over the last month as well as worsening edema and fluctuating weight. Weight is up 7 lbs since 03/2023. - Will order Echo. - Will check BNP and BMET.  - Continue Lasix  40mg  daily. Can take an extra 40mg  as needed for weight gain (3lbs in 1 day or 5lbs in 1 week).  - If BNP is significantly elevated, will likely have her take Lasix  40mg  twice daily for 3-5 days and then go back to above regimen. - Advised patient to let us  know if  symptoms worsen.    Chronic Lower Extremity Edema Varicose Veins S/p recent venous ablation of right leg in 11/2021. Edema not felt to be due to CHF. - She reports worsening edema over the last week.  - Will check BNP and BMET given worsening dyspnea on exertion as well. - Currently on Lasix  40mg  daily PNR but she states she has been taking this almost every day for the last week. Recommended continuing this on a daily basis and can take an extra 40mg  as needed for weight gain above.  - Continue compression stockings and low sodium diet.  Palpitations Paroxysmal Atrial Tachycardia History of tachypalpitations. Recent monitor in 07/2021 showed 5 runs of paroxysmal atrial tachycardia with the longest run lasting 11.4 seconds and rare PACs/PVCs but no significant arrhythmias.  - She has had worsening palpitations at night and has been taking her PRN Lopressor  almost every night for the last week which has helped. - Will stop PRN Lopressor  and start Toprol -XL 12.5mg  daily at night.   Non-Obstructive CAD Coronary CTA was ordered in 07/2021 for further evaluation of vague chest pain and worsening dyspnea on exertion and showed a coronary calcium  score of 59.1 (79th percentile for age and sex) with only mild non-obstructive disease (25-49% stenosis) in the mid LAD. No significant change compared to prior CTA in 2018.  - No chest pain. - Not on Aspirin  given allergy. - Continue statin.    Hyperlipidemia Lipid panel in 03/2023: Total Cholesterol 121, Triglycerides 64, 60, LDL 48. LDL goal <70 given CAD. - Continue Crestor  40mg  daily.   Disposition: Follow up in 3 months.    Signed, Tyquan Carmickle E Carnell Beavers, PA-C  08/22/2023 1:19 PM    [facility]

## 2023-08-22 ENCOUNTER — Other Ambulatory Visit (HOSPITAL_COMMUNITY): Payer: Self-pay

## 2023-08-22 ENCOUNTER — Ambulatory Visit: Payer: Self-pay | Attending: Student | Admitting: Student

## 2023-08-22 ENCOUNTER — Encounter: Payer: Self-pay | Admitting: Student

## 2023-08-22 VITALS — BP 118/76 | HR 77 | Ht 62.0 in | Wt 197.0 lb

## 2023-08-22 DIAGNOSIS — R0609 Other forms of dyspnea: Secondary | ICD-10-CM

## 2023-08-22 DIAGNOSIS — I4719 Other supraventricular tachycardia: Secondary | ICD-10-CM | POA: Diagnosis not present

## 2023-08-22 DIAGNOSIS — R002 Palpitations: Secondary | ICD-10-CM

## 2023-08-22 DIAGNOSIS — R600 Localized edema: Secondary | ICD-10-CM

## 2023-08-22 DIAGNOSIS — E785 Hyperlipidemia, unspecified: Secondary | ICD-10-CM

## 2023-08-22 DIAGNOSIS — I2510 Atherosclerotic heart disease of native coronary artery without angina pectoris: Secondary | ICD-10-CM | POA: Diagnosis not present

## 2023-08-22 MED ORDER — METOPROLOL SUCCINATE ER 25 MG PO TB24
12.5000 mg | ORAL_TABLET | Freq: Every evening | ORAL | 6 refills | Status: DC
  Filled 2023-08-22: qty 30, 60d supply, fill #0
  Filled 2023-10-20: qty 30, 60d supply, fill #1

## 2023-08-22 MED ORDER — FUROSEMIDE 40 MG PO TABS
40.0000 mg | ORAL_TABLET | Freq: Every evening | ORAL | 1 refills | Status: DC
  Filled 2023-08-22: qty 90, 90d supply, fill #0
  Filled 2023-11-26: qty 90, 90d supply, fill #1

## 2023-08-22 NOTE — Patient Instructions (Signed)
 Medication Instructions:  STOP METOPROLOL  TARTRATE START METOPROLOL  SUCC 12.5MG  AT NIGHT *If you need a refill on your cardiac medications before your next appointment, please call your pharmacy*  Lab Work: BMET AND BNP TODAY If you have labs (blood work) drawn today and your tests are completely normal, you will receive your results only by:  MyChart Message (if you have MyChart) OR A paper copy in the mail If you have any lab test that is abnormal or we need to change your treatment, we will call you to review the results.  Testing/Procedures: Your physician has requested that you have an echocardiogram. Echocardiography is a painless test that uses sound waves to create images of your heart. It provides your doctor with information about the size and shape of your heart and how well your heart's chambers and valves are working. This procedure takes approximately one hour. There are no restrictions for this procedure. Please do NOT wear cologne, perfume, aftershave, or lotions (deodorant is allowed). Please arrive 15 minutes prior to your appointment time.  Please note: We ask at that you not bring children with you during ultrasound (echo/ vascular) testing. Due to room size and safety concerns, children are not allowed in the ultrasound rooms during exams. Our front office staff cannot provide observation of children in our lobby area while testing is being conducted. An adult accompanying a patient to their appointment will only be allowed in the ultrasound room at the discretion of the ultrasound technician under special circumstances. We apologize for any inconvenience.   Follow-Up: At [facility], you and your health needs are our priority.  As part of our continuing mission to provide you with exceptional heart care, our providers are all part of one team.  This team includes your primary Cardiologist (physician) and Advanced Practice Providers or APPs (Physician Assistants and  Nurse Practitioners) who all work together to provide you with the care you need, when you need it.  Your next appointment:   3 month(s)  Provider:   Randene Bustard, MD or Sharren Decree, PA-C           Heart Failure Education: Weigh yourself EVERY morning after you go to the bathroom but before you eat or drink anything. Write this number down in a weight log/diary. If you gain 3 pounds overnight or 5 pounds in a week, TAKE EXTRA 40MG  OF FUROSEMIDE . Take your medicines as prescribed. If you have concerns about your medications, please call us  before you stop taking them.  Eat low salt foods--Limit salt (sodium) to 2000 mg per day. This will help prevent your body from holding onto fluid. Read food labels as many processed foods have a lot of sodium, especially canned goods and prepackaged meats. If you would like some assistance choosing low sodium foods, we would be happy to set you up with a nutritionist. Limit all fluids for the day to less than 2 liters (64 ounces). Fluid includes all drinks, coffee, juice, ice chips, soup, jello, and all other liquids. Stay as active as you can everyday. Staying active will give you more energy and make your muscles stronger. Start with 5 minutes at a time and work your way up to 30 minutes a day. Break up your activities--do some in the morning and some in the afternoon. Start with 3 days per week and work your way up to 5 days as you can.  If you have chest pain, feel short of breath, dizzy, or lightheaded, STOP. If you  don't feel better after a short rest, call 911. If you do feel better, call the office to let us  know you have symptoms with exercise.

## 2023-08-24 LAB — BASIC METABOLIC PANEL WITH GFR
BUN/Creatinine Ratio: 15 (ref 12–28)
BUN: 13 mg/dL (ref 8–27)
CO2: 18 mmol/L — ABNORMAL LOW (ref 20–29)
Calcium: 9.5 mg/dL (ref 8.7–10.3)
Chloride: 105 mmol/L (ref 96–106)
Creatinine, Ser: 0.88 mg/dL (ref 0.57–1.00)
Glucose: 59 mg/dL — ABNORMAL LOW (ref 70–99)
Potassium: 4.6 mmol/L (ref 3.5–5.2)
Sodium: 142 mmol/L (ref 134–144)
eGFR: 73 mL/min/{1.73_m2} (ref >59)

## 2023-08-24 LAB — BRAIN NATRIURETIC PEPTIDE: BNP: 43 pg/mL (ref 0.0–100.0)

## 2023-08-27 ENCOUNTER — Other Ambulatory Visit: Payer: Self-pay | Admitting: *Deleted

## 2023-08-27 DIAGNOSIS — R0609 Other forms of dyspnea: Secondary | ICD-10-CM

## 2023-08-27 DIAGNOSIS — R600 Localized edema: Secondary | ICD-10-CM

## 2023-08-31 ENCOUNTER — Other Ambulatory Visit: Payer: Self-pay

## 2023-08-31 DIAGNOSIS — R0609 Other forms of dyspnea: Secondary | ICD-10-CM | POA: Diagnosis not present

## 2023-08-31 DIAGNOSIS — R600 Localized edema: Secondary | ICD-10-CM | POA: Diagnosis not present

## 2023-09-01 LAB — BASIC METABOLIC PANEL WITH GFR
BUN/Creatinine Ratio: 16 (ref 12–28)
BUN: 14 mg/dL (ref 8–27)
CO2: 22 mmol/L (ref 20–29)
Calcium: 9.4 mg/dL (ref 8.7–10.3)
Chloride: 103 mmol/L (ref 96–106)
Creatinine, Ser: 0.89 mg/dL (ref 0.57–1.00)
Glucose: 103 mg/dL — ABNORMAL HIGH (ref 70–99)
Potassium: 4 mmol/L (ref 3.5–5.2)
Sodium: 142 mmol/L (ref 134–144)
eGFR: 72 mL/min/{1.73_m2} (ref >59)

## 2023-09-04 ENCOUNTER — Ambulatory Visit: Payer: Self-pay

## 2023-09-04 ENCOUNTER — Ambulatory Visit: Payer: Self-pay | Admitting: Student

## 2023-09-10 ENCOUNTER — Other Ambulatory Visit: Payer: Self-pay | Admitting: Cardiology

## 2023-09-12 ENCOUNTER — Other Ambulatory Visit: Payer: Self-pay

## 2023-09-12 ENCOUNTER — Other Ambulatory Visit (HOSPITAL_COMMUNITY): Payer: Self-pay

## 2023-09-12 MED ORDER — ESTRADIOL 0.1 MG/GM VA CREA
TOPICAL_CREAM | VAGINAL | 6 refills | Status: DC
  Filled 2023-09-12: qty 42.5, 90d supply, fill #0
  Filled 2023-11-22: qty 42.5, 90d supply, fill #1

## 2023-09-22 ENCOUNTER — Other Ambulatory Visit (HOSPITAL_COMMUNITY): Payer: Self-pay

## 2023-09-24 ENCOUNTER — Ambulatory Visit: Payer: Self-pay | Admitting: Internal Medicine

## 2023-09-24 ENCOUNTER — Other Ambulatory Visit (HOSPITAL_COMMUNITY): Payer: Self-pay

## 2023-09-24 ENCOUNTER — Ambulatory Visit (INDEPENDENT_AMBULATORY_CARE_PROVIDER_SITE_OTHER): Payer: Medicare HMO | Admitting: Internal Medicine

## 2023-09-24 ENCOUNTER — Encounter: Payer: Self-pay | Admitting: Internal Medicine

## 2023-09-24 VITALS — BP 126/74 | HR 85 | Temp 98.2°F | Ht 62.0 in | Wt 195.0 lb

## 2023-09-24 DIAGNOSIS — E785 Hyperlipidemia, unspecified: Secondary | ICD-10-CM

## 2023-09-24 DIAGNOSIS — E66812 Obesity, class 2: Secondary | ICD-10-CM

## 2023-09-24 DIAGNOSIS — R7302 Impaired glucose tolerance (oral): Secondary | ICD-10-CM | POA: Diagnosis not present

## 2023-09-24 DIAGNOSIS — E039 Hypothyroidism, unspecified: Secondary | ICD-10-CM | POA: Diagnosis not present

## 2023-09-24 DIAGNOSIS — E559 Vitamin D deficiency, unspecified: Secondary | ICD-10-CM

## 2023-09-24 DIAGNOSIS — Z0001 Encounter for general adult medical examination with abnormal findings: Secondary | ICD-10-CM

## 2023-09-24 DIAGNOSIS — Z0000 Encounter for general adult medical examination without abnormal findings: Secondary | ICD-10-CM | POA: Diagnosis not present

## 2023-09-24 DIAGNOSIS — Z6835 Body mass index (BMI) 35.0-35.9, adult: Secondary | ICD-10-CM

## 2023-09-24 DIAGNOSIS — E538 Deficiency of other specified B group vitamins: Secondary | ICD-10-CM

## 2023-09-24 LAB — LIPID PANEL
Cholesterol: 116 mg/dL (ref 0–200)
HDL: 61.9 mg/dL (ref >39.00)
LDL Cholesterol: 38 mg/dL (ref 0–99)
NonHDL: 53.81
Total CHOL/HDL Ratio: 2
Triglycerides: 80 mg/dL (ref 0.0–149.0)
VLDL: 16 mg/dL (ref 0.0–40.0)

## 2023-09-24 LAB — HEPATIC FUNCTION PANEL
ALT: 20 U/L (ref 0–35)
AST: 24 U/L (ref 0–37)
Albumin: 4.2 g/dL (ref 3.5–5.2)
Alkaline Phosphatase: 71 U/L (ref 39–117)
Bilirubin, Direct: 0.1 mg/dL (ref 0.0–0.3)
Total Bilirubin: 0.4 mg/dL (ref 0.2–1.2)
Total Protein: 7.2 g/dL (ref 6.0–8.3)

## 2023-09-24 LAB — CBC WITH DIFFERENTIAL/PLATELET
Basophils Absolute: 0.1 10*3/uL (ref 0.0–0.1)
Basophils Relative: 1.2 % (ref 0.0–3.0)
Eosinophils Absolute: 0.3 10*3/uL (ref 0.0–0.7)
Eosinophils Relative: 6.1 % — ABNORMAL HIGH (ref 0.0–5.0)
HCT: 41.1 % (ref 36.0–46.0)
Hemoglobin: 13.6 g/dL (ref 12.0–15.0)
Lymphocytes Relative: 32.4 % (ref 12.0–46.0)
Lymphs Abs: 1.5 10*3/uL (ref 0.7–4.0)
MCHC: 33.2 g/dL (ref 30.0–36.0)
MCV: 92.5 fl (ref 78.0–100.0)
Monocytes Absolute: 0.4 10*3/uL (ref 0.1–1.0)
Monocytes Relative: 8.8 % (ref 3.0–12.0)
Neutro Abs: 2.3 10*3/uL (ref 1.4–7.7)
Neutrophils Relative %: 51.5 % (ref 43.0–77.0)
Platelets: 216 10*3/uL (ref 150.0–400.0)
RBC: 4.44 Mil/uL (ref 3.87–5.11)
RDW: 13.8 % (ref 11.5–15.5)
WBC: 4.5 10*3/uL (ref 4.0–10.5)

## 2023-09-24 LAB — TSH: TSH: 0.95 u[IU]/mL (ref 0.35–5.50)

## 2023-09-24 LAB — BASIC METABOLIC PANEL WITH GFR
BUN: 15 mg/dL (ref 6–23)
CO2: 28 meq/L (ref 19–32)
Calcium: 9.4 mg/dL (ref 8.4–10.5)
Chloride: 104 meq/L (ref 96–112)
Creatinine, Ser: 0.8 mg/dL (ref 0.40–1.20)
GFR: 77.05 mL/min (ref >60.00)
Glucose, Bld: 83 mg/dL (ref 70–99)
Potassium: 3.8 meq/L (ref 3.5–5.1)
Sodium: 140 meq/L (ref 135–145)

## 2023-09-24 LAB — VITAMIN B12: Vitamin B-12: 512 pg/mL (ref 211–911)

## 2023-09-24 LAB — VITAMIN D 25 HYDROXY (VIT D DEFICIENCY, FRACTURES): VITD: 42.75 ng/mL (ref 30.00–100.00)

## 2023-09-24 LAB — HEMOGLOBIN A1C: Hgb A1c MFr Bld: 5.8 % (ref 4.6–6.5)

## 2023-09-24 MED ORDER — PHENTERMINE HCL 30 MG PO CAPS
30.0000 mg | ORAL_CAPSULE | ORAL | 1 refills | Status: DC
  Filled 2023-09-24: qty 90, 90d supply, fill #0
  Filled 2023-12-31 (×2): qty 90, 90d supply, fill #1

## 2023-09-24 NOTE — Assessment & Plan Note (Signed)
 Last vitamin D Lab Results  Component Value Date   VD25OH 60.78 03/26/2023   Stable, cont oral replacement

## 2023-09-24 NOTE — Progress Notes (Signed)
 The test results show that your current treatment is OK, as the tests are stable.  Please continue the same plan.  There is no other need for change of treatment or further evaluation based on these results, at this time.  thanks

## 2023-09-24 NOTE — Patient Instructions (Addendum)
 Please take all new medication as prescribed - the phentermine   Please continue all other medications as before, and refills have been done if requested.  Please have the pharmacy call with any other refills you may need.  Please continue your efforts at being more active, low cholesterol diet, and weight control.  You are otherwise up to date with prevention measures today.  Please keep your appointments with your specialists as you may have planned  - orthopedic for the left shoulder  Please go to the LAB at the blood drawing area for the tests to be done  You will be contacted by phone if any changes need to be made immediately.  Otherwise, you will receive a letter about your results with an explanation, but please check with MyChart first.  Please make an Appointment to return in 6 months, or sooner if needed

## 2023-09-24 NOTE — Assessment & Plan Note (Signed)
 Hard to control, ok for add phentermine  30 qd

## 2023-09-24 NOTE — Assessment & Plan Note (Signed)

## 2023-09-24 NOTE — Assessment & Plan Note (Signed)
 Lab Results  Component Value Date   TSH 4.36 03/26/2023   Stable, pt to continue levothyroxine  112 mcg qd
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
## 2023-09-24 NOTE — Assessment & Plan Note (Signed)
 Lab Results  Component Value Date   LDLCALC 48 03/26/2023   Stable, pt to continue current statin crestor 40 mg qd

## 2023-09-25 ENCOUNTER — Encounter: Payer: Self-pay | Admitting: Allergy & Immunology

## 2023-09-25 ENCOUNTER — Other Ambulatory Visit: Payer: Self-pay

## 2023-09-25 ENCOUNTER — Ambulatory Visit: Payer: Medicare HMO | Admitting: Allergy & Immunology

## 2023-09-25 ENCOUNTER — Other Ambulatory Visit (HOSPITAL_COMMUNITY): Payer: Self-pay

## 2023-09-25 VITALS — BP 110/68 | HR 91 | Temp 99.6°F | Resp 20 | Ht 63.5 in | Wt 197.8 lb

## 2023-09-25 DIAGNOSIS — J4540 Moderate persistent asthma, uncomplicated: Secondary | ICD-10-CM | POA: Diagnosis not present

## 2023-09-25 DIAGNOSIS — L299 Pruritus, unspecified: Secondary | ICD-10-CM | POA: Diagnosis not present

## 2023-09-25 DIAGNOSIS — L508 Other urticaria: Secondary | ICD-10-CM

## 2023-09-25 LAB — URINALYSIS, ROUTINE W REFLEX MICROSCOPIC
Bilirubin Urine: NEGATIVE
Hgb urine dipstick: NEGATIVE
Ketones, ur: NEGATIVE
Nitrite: NEGATIVE
Specific Gravity, Urine: <=1.005 — AB (ref 1.000–1.030)
Total Protein, Urine: NEGATIVE
Urine Glucose: NEGATIVE
Urobilinogen, UA: 0.2 (ref 0.0–1.0)
pH: 6 (ref 5.0–8.0)

## 2023-09-25 MED ORDER — HYDROXYZINE HCL 10 MG PO TABS
10.0000 mg | ORAL_TABLET | Freq: Three times a day (TID) | ORAL | 5 refills | Status: AC | PRN
  Filled 2023-09-25 (×2): qty 90, 30d supply, fill #0
  Filled 2023-12-10 – 2023-12-11 (×2): qty 90, 30d supply, fill #1
  Filled 2024-02-29: qty 90, 30d supply, fill #2
  Filled 2024-04-10: qty 90, 30d supply, fill #3

## 2023-09-25 NOTE — Progress Notes (Signed)
 FOLLOW UP  Date of Service/Encounter:  09/25/23  Consult requested by: Roslyn Coombe, MD   Assessment:   Pruritis - with sensitization to dust mite    Chronic urticaria - doing fairly well despite not being on Xolair  since February 2025   Mild eosinophilia (AEC 500)   Moderate persistent asthma, uncomplicated    Plan/Recommendations:   1. Moderate intermittent asthma, uncomplicated - Lung testing looks excellent today.  - Daily controller medication(s): Wixela 250/50mg  one puff TWICE daily - Rescue medications: albuterol  4 puffs every 4-6 hours as needed WITH COUGHING/WHEEZING - Asthma control goals:  * Full participation in all desired activities (may need albuterol  before activity) * Albuterol  use two time or less a week on average (not counting use with activity) * Cough interfering with sleep two time or less a month * Oral steroids no more than once a year * No hospitalizations  2. Pruritus with urticaria - Stopped the Xolair  in Feb 2025 due to insurance coverage.  - Continue to follow with Dermatology.  - Continue with triamcinolone  twice daily as needed.  - Continue with the pimecrolimus  twice daily as needed (SAFE TO USE HEAD TO TOE). - You can continue with the antihistamines:   AM AS NEEDED: Pepcid (famotidine) + hydroxyzine  10mg    NOON AS NEEDED: Claritin  (loratadine ) 1-2 times daily  PM AS NEEDED: Pepcid (famotidine) + hydroxyzine  10mg    3. Return in about 6 months (around 03/26/2024). You can have the follow up appointment with Dr. Idolina Maker or a Nurse Practicioner (our Nurse Practitioners are excellent and always have Physician oversight!).    This note in its entirety was forwarded to the Provider who requested this consultation.  Subjective:   [name] is a 68 y.o. female presenting today for evaluation of  Chief Complaint  Patient presents with   Follow-up    Urticaria--has a few spots on arms and legs. Dermatology put her on steroid  injections. Next on is in Aug.  Asthma--Has been good until a few days ago. Weather change is a factor.     [name] has a history of the following: Patient Active Problem List   Diagnosis Date Noted   Urinary bladder stone 06/19/2023   UTI (urinary tract infection) 03/27/2023   Wrist pain, acute, left 11/07/2022   Neck pain, bilateral 11/07/2022   Fall 11/07/2022   Chronic pain of both shoulders 11/07/2022   Cough 10/26/2021   Wheezing 10/26/2021   Elevated coronary artery calcium  score 07/30/2021   Vitamin D  deficiency 06/11/2021   Dysuria 08/31/2020   Chronic pruritus 05/30/2020   Rash 04/03/2020   Chest pain 11/14/2019   Insomnia 11/14/2019   Pelvic prolapse 10/06/2019   Leg pain, bilateral 05/01/2019   Disease of vein 05/01/2019   Acute diverticulitis 12/23/2018   Diverticulitis 12/09/2018   Asthma 10/02/2018   Pain in right hand 07/12/2018   Pain in both feet 11/08/2017   Nodule of chest wall 10/11/2017   Mass of right side of neck 10/11/2017   Varicose veins of bilateral lower extremities with other complications 08/30/2017   Complication of anesthesia    Right groin pain 04/21/2017   Hyperlipidemia LDL goal <100 03/23/2017   Dorsalgia 03/01/2017   Rapid palpitations 03/01/2017   Preop cardiovascular exam 03/01/2017   Right lumbar radiculopathy 08/15/2016   Rib pain on right side 03/03/2016   Nosebleed 03/03/2016   Acute colitis 08/30/2013   GERD (gastroesophageal reflux disease) 08/14/2012   Pain and swelling of  lower leg 10/23/2011   Depression 07/10/2011   History of renal stone 04/15/2011   Pleural effusion 04/14/2011   Hypokalemia 04/14/2011   Chronic low back pain 03/29/2011   Obesity 03/29/2011   Encounter for well adult exam with abnormal findings 12/30/2010   THYROID  NODULE, RIGHT 02/21/2010   NECK PAIN, RIGHT 02/21/2010   Bilateral lower extremity edema 02/21/2010   Atony of bladder 11/10/2009   Fatigue 03/11/2009   SINUSITIS, CHRONIC  11/27/2008   Hypothyroidism 07/19/2007   Anxiety state 07/19/2007   Allergic rhinitis 07/19/2007   DIVERTICULOSIS, COLON 07/19/2007   DEGENERATIVE DISC DISEASE 07/19/2007   Hx of colonic polyps 07/19/2007    History obtained from: chart review and patient.  Discussed the use of AI scribe software for clinical note transcription with the patient and/or guardian, who gave verbal consent to proceed.  [name] was referred by Roslyn Coombe, MD.     [name] is a 68 y.o. female presenting for a follow up visit. She was last seen in December 2024. At that time, lung testing looked excellent. WE did not make any medication changes. We continued with Wixela 250/50 one puff twice daily and albuterol  as needed. For her pruritus, we continued with Xolair  every 2 weeks and antihistamines in the morning and evening.   Since the last visit, she has done relatively well all things considered.    Asthma/Respiratory Symptom History: She remains on the Wixela twice daily. [name]'s asthma has been well controlled. She has not required rescue medication, experienced nocturnal awakenings due to lower respiratory symptoms, nor have activities of daily living been limited. She has required no Emergency Department or Urgent Care visits for her asthma. She has required zero courses of systemic steroids for asthma exacerbations since the last visit. ACT score today is 25, indicating excellent asthma symptom control.   Allergic Rhinitis Symptom History: Environmental allergies are fairly well controlled with the current regimen. She did recently started loratadine  which she is using only during the allergy season. This is working well to control her symptoms.   Skin Symptom History: She reports that she continues to have bites intermittently that irritate her skin, but this is much less frequent than it has been in the past. She does have the pimecrolimus  that she has bneen using. She doesn ot need refills for this.  She actually feels fairly good with her Xolair  on board.   Otherwise, there is no history of other atopic diseases, including drug allergies, stinging insect allergies, or contact dermatitis. There is no significant infectious history. Vaccinations are up to date.   Review of systems otherwise negative other than that mentioned in the HPI.    Objective:   Blood pressure 110/68, pulse 91, temperature 99.6 F (37.6 C), temperature source Temporal, resp. rate 20, height 5' 3.5" (1.613 m), weight 197 lb 12.8 oz (89.7 kg), SpO2 96%. Body mass index is 34.49 kg/m.     Physical Exam Vitals reviewed.  Constitutional:      Appearance: She is well-developed.     Comments: Pleasant.  Cooperative.  HENT:     Head: Normocephalic and atraumatic.     Right Ear: Tympanic membrane, ear canal and external ear normal.     Left Ear: Tympanic membrane, ear canal and external ear normal.     Nose: No nasal deformity, septal deviation, mucosal edema or rhinorrhea.     Right Turbinates: Enlarged, swollen and pale.     Left Turbinates: Enlarged, swollen and pale.  Right Sinus: No maxillary sinus tenderness or frontal sinus tenderness.     Left Sinus: No maxillary sinus tenderness or frontal sinus tenderness.     Comments: No nasal polyps.    Mouth/Throat:     Mouth: Mucous membranes are not pale and not dry.     Pharynx: Uvula midline.  Eyes:     General: Lids are normal. No allergic shiner.       Right eye: No discharge.        Left eye: No discharge.     Conjunctiva/sclera: Conjunctivae normal.     Right eye: Right conjunctiva is not injected. No chemosis.    Left eye: Left conjunctiva is not injected. No chemosis.    Pupils: Pupils are equal, round, and reactive to light.  Cardiovascular:     Rate and Rhythm: Normal rate and regular rhythm.     Heart sounds: Normal heart sounds.  Pulmonary:     Effort: Pulmonary effort is normal. No tachypnea, accessory muscle usage or respiratory  distress.     Breath sounds: Normal breath sounds. No wheezing, rhonchi or rales.     Comments: Moving air well in all lung fields. Chest:     Chest wall: No tenderness.  Lymphadenopathy:     Cervical: No cervical adenopathy.  Skin:    General: Skin is warm.     Capillary Refill: Capillary refill takes less than 2 seconds.     Coloration: Skin is not pale.     Findings: Lesion present. No abrasion, erythema, petechiae or rash. Rash is not papular, urticarial or vesicular.     Comments: Overall, skin looks much better.  She has some lesions on her left arm with some eschar formation. No oozing. Minimal excoriations.   Neurological:     Mental Status: She is alert.  Psychiatric:        Behavior: Behavior is cooperative.      Diagnostic studies:    Spirometry: results normal (FEV1: 2.03/93%, FVC: 2.52/91%, FEV1/FVC: 81%).    Spirometry consistent with normal pattern.    Allergy Studies: none          Drexel Gentles, MD [facility] 

## 2023-09-25 NOTE — Progress Notes (Signed)
spiro

## 2023-09-25 NOTE — Patient Instructions (Addendum)
 1. Moderate intermittent asthma, uncomplicated - Lung testing looks excellent today.  - Daily controller medication(s): Wixela 250/50mg  one puff TWICE daily - Rescue medications: albuterol  4 puffs every 4-6 hours as needed WITH COUGHING/WHEEZING - Asthma control goals:  * Full participation in all desired activities (may need albuterol  before activity) * Albuterol  use two time or less a week on average (not counting use with activity) * Cough interfering with sleep two time or less a month * Oral steroids no more than once a year * No hospitalizations  2. Pruritus with urticaria - Stopped the Xolair  in Feb 2025 due to insurance coverage.  - Continue to follow with Dermatology.  - Continue with triamcinolone  twice daily as needed.  - Continue with the pimecrolimus  twice daily as needed (SAFE TO USE HEAD TO TOE). - You can continue with the antihistamines:   AM AS NEEDED: Pepcid (famotidine) + hydroxyzine  10mg    NOON AS NEEDED: Claritin  (loratadine ) 1-2 times daily  PM AS NEEDED: Pepcid (famotidine) + hydroxyzine  10mg    3. Return in about 6 months (around 03/26/2024). You can have the follow up appointment with Dr. Idolina Maker or a Nurse Practicioner (our Nurse Practitioners are excellent and always have Physician oversight!).    Please inform us  of any Emergency Department visits, hospitalizations, or changes in symptoms. Call us  before going to the ED for breathing or allergy symptoms since we might be able to fit you in for a sick visit. Feel free to contact us  anytime with any questions, problems, or concerns.  It was a pleasure to see you again today!  Websites that have reliable patient information: 1. American Academy of Asthma, Allergy, and Immunology: www.aaaai.org 2. Food Allergy Research and Education (FARE): foodallergy.org 3. Mothers of Asthmatics: http://www.asthmacommunitynetwork.org 4. American College of Allergy, Asthma, and Immunology: www.acaai.org      "Like" us   on Facebook and Instagram for our latest updates!      A healthy democracy works best when [employer] participate! Make sure you are registered to vote! If you have moved or changed any of your contact information, you will need to get this updated before voting! Scan the QR codes below to learn more!

## 2023-09-26 ENCOUNTER — Ambulatory Visit (HOSPITAL_COMMUNITY)
Admission: RE | Admit: 2023-09-26 | Discharge: 2023-09-26 | Disposition: A | Discharge status: Home / Self Care | Source: Ambulatory Visit | Attending: Cardiology | Admitting: Cardiology

## 2023-09-26 DIAGNOSIS — R0609 Other forms of dyspnea: Secondary | ICD-10-CM | POA: Diagnosis not present

## 2023-09-26 HISTORY — PX: TRANSTHORACIC ECHOCARDIOGRAM: SHX275

## 2023-09-26 LAB — ECHOCARDIOGRAM COMPLETE
Area-P 1/2: 4.6 cm2
S' Lateral: 2.7 cm

## 2023-10-02 NOTE — Telephone Encounter (Signed)
 Where is she noticing the fluid retention? Just her legs? Her BNP in 07/2023 was not suggestive of CHF. She can continue to take Lasix  40mg  daily and then can take an extra dose as needed for weight gain (3lbs in 1 day or 5lbs in 1 week) or worsening edema. However, if she is needing multiple extra doses of Lasix  a week, she should let us  know.   Her BP has been well controlled at last several office visits and she has no documented history of hypertension, so I don't think there is an underlying BP issue. However, she can always monitor this at home if this is something she is concerned about.  Thank you!

## 2023-10-05 ENCOUNTER — Ambulatory Visit: Admitting: Orthopedic Surgery

## 2023-10-05 ENCOUNTER — Other Ambulatory Visit: Payer: Self-pay

## 2023-10-05 ENCOUNTER — Encounter: Payer: Self-pay | Admitting: Orthopedic Surgery

## 2023-10-05 DIAGNOSIS — M19012 Primary osteoarthritis, left shoulder: Secondary | ICD-10-CM | POA: Diagnosis not present
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
## 2023-10-06 MED ORDER — METHYLPREDNISOLONE ACETATE 40 MG/ML IJ SUSP
40.0000 mg | INTRAMUSCULAR | Status: AC | PRN
  Administered 2023-10-05: 40 mg via INTRA_ARTICULAR

## 2023-10-06 MED ORDER — LIDOCAINE HCL 1 % IJ SOLN
5.0000 mL | INTRAMUSCULAR | Status: AC | PRN
  Administered 2023-10-05: 5 mL

## 2023-10-06 MED ORDER — BUPIVACAINE HCL 0.5 % IJ SOLN
9.0000 mL | INTRAMUSCULAR | Status: AC | PRN
  Administered 2023-10-05: 9 mL via INTRA_ARTICULAR

## 2023-10-10 ENCOUNTER — Other Ambulatory Visit (HOSPITAL_COMMUNITY): Payer: Self-pay

## 2023-10-22 ENCOUNTER — Other Ambulatory Visit: Payer: Self-pay

## 2023-10-22 ENCOUNTER — Other Ambulatory Visit (HOSPITAL_COMMUNITY): Payer: Self-pay

## 2023-10-24 ENCOUNTER — Other Ambulatory Visit (HOSPITAL_COMMUNITY): Payer: Self-pay

## 2023-11-02 ENCOUNTER — Encounter: Payer: Self-pay | Admitting: Pharmacist

## 2023-11-02 ENCOUNTER — Other Ambulatory Visit (HOSPITAL_COMMUNITY): Payer: Self-pay

## 2023-11-02 ENCOUNTER — Other Ambulatory Visit: Payer: Self-pay

## 2023-11-02 DIAGNOSIS — J029 Acute pharyngitis, unspecified: Secondary | ICD-10-CM | POA: Diagnosis not present

## 2023-11-02 DIAGNOSIS — J069 Acute upper respiratory infection, unspecified: Secondary | ICD-10-CM | POA: Diagnosis not present

## 2023-11-02 DIAGNOSIS — R051 Acute cough: Secondary | ICD-10-CM | POA: Diagnosis not present

## 2023-11-02 MED ORDER — AZITHROMYCIN 250 MG PO TABS
ORAL_TABLET | ORAL | 0 refills | Status: AC
  Filled 2023-11-02 (×2): qty 6, 5d supply, fill #0

## 2023-11-02 MED ORDER — BENZONATATE 100 MG PO CAPS
100.0000 mg | ORAL_CAPSULE | Freq: Three times a day (TID) | ORAL | 0 refills | Status: DC | PRN
  Filled 2023-11-02 (×2): qty 20, 7d supply, fill #0

## 2023-11-05 ENCOUNTER — Other Ambulatory Visit: Payer: Self-pay

## 2023-11-05 ENCOUNTER — Other Ambulatory Visit (HOSPITAL_COMMUNITY): Payer: Self-pay

## 2023-11-06 ENCOUNTER — Encounter: Payer: Self-pay | Admitting: Internal Medicine

## 2023-11-06 ENCOUNTER — Ambulatory Visit: Admitting: Internal Medicine

## 2023-11-06 ENCOUNTER — Other Ambulatory Visit (HOSPITAL_COMMUNITY): Payer: Self-pay

## 2023-11-06 VITALS — BP 96/64 | HR 84 | Temp 98.1°F | Ht 63.5 in | Wt 192.8 lb

## 2023-11-06 DIAGNOSIS — E559 Vitamin D deficiency, unspecified: Secondary | ICD-10-CM | POA: Diagnosis not present

## 2023-11-06 DIAGNOSIS — J309 Allergic rhinitis, unspecified: Secondary | ICD-10-CM | POA: Diagnosis not present

## 2023-11-06 DIAGNOSIS — J4540 Moderate persistent asthma, uncomplicated: Secondary | ICD-10-CM | POA: Diagnosis not present

## 2023-11-06 DIAGNOSIS — J069 Acute upper respiratory infection, unspecified: Secondary | ICD-10-CM

## 2023-11-06 MED ORDER — DOXYCYCLINE HYCLATE 100 MG PO TABS
100.0000 mg | ORAL_TABLET | Freq: Two times a day (BID) | ORAL | 0 refills | Status: DC
  Filled 2023-11-06: qty 20, 10d supply, fill #0
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
## 2023-11-06 NOTE — Assessment & Plan Note (Signed)
 Last vitamin D  Lab Results  Component Value Date   VD25OH 42.75 09/24/2023   Stable, cont oral replacement

## 2023-11-06 NOTE — Assessment & Plan Note (Signed)
Stable overall, cont inhaler prn 

## 2023-11-06 NOTE — Assessment & Plan Note (Signed)
 Mild to mod, for restart claritin  10 mg every day prn, consider add nasacort  asd,  to f/u any worsening symptoms or concerns

## 2023-11-06 NOTE — Patient Instructions (Signed)
 Please take all new medication as prescribed  - the doxycyline 100 mg twice per day  Please continue all other medications as before, and refills have been done if requested.  Please have the pharmacy call with any other refills you may need.  Please keep your appointments with your specialists as you may have planned

## 2023-11-06 NOTE — Assessment & Plan Note (Signed)
 Mild to mod, for antibx course doxycyline 100 bid,  to f/u any worsening symptoms or concerns

## 2023-11-21 ENCOUNTER — Encounter: Payer: Self-pay | Admitting: Cardiology

## 2023-11-21 ENCOUNTER — Other Ambulatory Visit (HOSPITAL_COMMUNITY): Payer: Self-pay

## 2023-11-21 ENCOUNTER — Ambulatory Visit: Attending: Cardiology | Admitting: Cardiology

## 2023-11-21 VITALS — BP 110/60 | HR 96 | Ht 62.0 in | Wt 190.0 lb

## 2023-11-21 DIAGNOSIS — E66812 Obesity, class 2: Secondary | ICD-10-CM

## 2023-11-21 DIAGNOSIS — I83893 Varicose veins of bilateral lower extremities with other complications: Secondary | ICD-10-CM

## 2023-11-21 DIAGNOSIS — R0609 Other forms of dyspnea: Secondary | ICD-10-CM

## 2023-11-21 DIAGNOSIS — E785 Hyperlipidemia, unspecified: Secondary | ICD-10-CM | POA: Diagnosis not present

## 2023-11-21 DIAGNOSIS — R600 Localized edema: Secondary | ICD-10-CM

## 2023-11-21 DIAGNOSIS — Z6835 Body mass index (BMI) 35.0-35.9, adult: Secondary | ICD-10-CM

## 2023-11-21 DIAGNOSIS — R002 Palpitations: Secondary | ICD-10-CM | POA: Diagnosis not present

## 2023-11-21 DIAGNOSIS — R931 Abnormal findings on diagnostic imaging of heart and coronary circulation: Secondary | ICD-10-CM | POA: Diagnosis not present

## 2023-11-21 MED ORDER — METOPROLOL SUCCINATE ER 25 MG PO TB24
25.0000 mg | ORAL_TABLET | Freq: Every evening | ORAL | 3 refills | Status: AC
  Filled 2023-11-21: qty 90, 90d supply, fill #0
  Filled 2024-02-29: qty 90, 90d supply, fill #1

## 2023-11-21 NOTE — Patient Instructions (Addendum)
 Medication Instructions:   Increase Metoprolol  to 50 mg daily at bedtime  *If you need a refill on your cardiac medications before your next appointment, please call your pharmacy*   Lab Work: Not needed    Testing/Procedures: Will be schedule at [facility] at [facility]  Your physician has recommended that you have a cardiopulmonary stress test (CPX). CPX testing is a non-invasive measurement of heart and lung function. It replaces a traditional treadmill stress test. This type of test provides a tremendous amount of information that relates not only to your present condition but also for future outcomes. This test combines measurements of you ventilation, respiratory gas exchange in the lungs, electrocardiogram (EKG), blood pressure and physical response before, during, and following an exercise protocol.    Follow-Up: At [facility], you and your health needs are our priority.  As part of our continuing mission to provide you with exceptional heart care, we have created designated Provider Care Teams.  These Care Teams include your primary Cardiologist (physician) and Advanced Practice Providers (APPs -  Physician Assistants and Nurse Practitioners) who all work together to provide you with the care you need, when you need it.     Your next appointment:   4 month(s)  The format for your next appointment:   In Person  Provider:   Alm Clay, MD   Other Instructions    CPX TEST PATIENT INSTRUCTIONS    You are scheduled for a Cardiopulmonary Exercise (CPX) Test at [facility] on:  ____/____/____ at ____:____ am/pm.  Expect to be in the lab for 2 hours. Please plan to arrive 30 MINUTES PRIOR to your appointment. You may be asked to reschedule your test if you arrive 20 minutes or more after your scheduled appointment time.  Main Campus address: [address] [place], [place] [address]  *You may arrive to Main Entrance A or Entrance C (Free valet  parking is available at both).  - Main Entrance A (from [facility]): proceed to admitting for check-in  - Entrance C (from [facility]): proceed to [employer] parking or under hospital deck parking using this code _______________  Check-in: [facility] waiting [facility]   General instructions for the day of the test (Please follow all instructions from your physician):   Refrain from ingesting a heavy meal, alcohol, or caffeine or using tobacco products within 2 hours of the test (DO NOT FAST for more than 8 hours). You may have all other non-alcoholic, non-caffeinated beverages a light snack (crackers, a piece of fruit, carrot sticks, toast, bagel, etc.) up to your appointment time.   Avoid significant exertion or exercise within 24 hours of your test.   Be prepared to exercise and sweat. Your clothing should permit freedom of movement and include walking or running shoes. Women bring loose-fitting, short-sleeved blouse.   This evaluation may be fatiguing, and you may wish to have someone accompany you to the assessment to drive you home afterward.   Bring a list of your medications with you, including dosage and frequency you take the medication (i.e., once per day, twice per day, etc).   Take all medications as prescribed, unless noted below or instructed to do so by your physician.  o Please do not take the following medications prior to your CPX:  Brief description of the test:  A brief lung function test will be performed. This will involve you taking deep breaths and blowing out hard and fast through your  mouth. During these, a clip will be on your nose and you will be breathing through a breathing device.  For the exercise portion of the test you will be walking on a treadmill, or riding a stationary bike, to your maximal effort or until symptoms such as chest pain, shortness of breath, leg pain or dizziness limit your exercise. You will be breathing in and  out of a breathing device through your mouth (a clip will be on your nose again). Your heart rate, ECG, blood pressure, oxygen saturation, breathing rate and depth, amount of oxygen you consume and amount of carbon dioxide you produce will be measured and monitored throughout the exercise test.  If you need to cancel or reschedule your appointment please call: [phone]  If you have any further questions please call your physician or Josette Pesa, MS, ACSM-

## 2023-11-21 NOTE — Progress Notes (Signed)
 Cardiology Office Note:  .   Date:  11/24/2023  ID:  [name], DOB [birth year], MRN [id number] PCP: Norleen Lynwood ORN, MD  [facility] Providers Cardiologist:  Alm Clay, MD     Chief Complaint  Patient presents with   Follow-up    76-month follow-up-saw Callie Goodrich,PA in April for exertional dyspnea    Patient Profile: .     [name] is a moderately obese 68 y.o. female  with a PMH who presents here for 9-month follow-up evaluation of exertional dyspnea at the request of Burnard Door, [place].    Referring Provider: Norleen Lynwood ORN, MD.  Pertinent PMH: Mild nonobstructive CAD on coronary CTA in 07/2021, tachypalpitations with short runs of PAT and rare PACs/PVCs noted on monitor in 07/2021, varicose veins of bilateral lower extremities with chronic edema s/p ablation, hyperlipidemia, hypothyroidism, and mild asthma    I last saw her on 07/2022 at which time she reported occasional episode of shortness of breath and swelling at the end of the as well as intermittent episodes of heart racing. However, she rarely required extra doses of Lasix  or Lopressor . Lopressor  was increased.     [name] was last seen on August 22, 2023 here by Aline Door, PA with complaints of exertional dyspnea over the past month or so.  She described getting short of breath with mild activity such as walking up 3 steps to her house or walking a short distance to her garden. Dyspnea tended to improve sometimes with her inhaler. No dyspnea at rest. No orthopnea. She noted rarely waking up in the middle of the night feeling short of breath but this sounds more due to her asthma than PND.  She also reported worsening lower extremity edema over the last month and states her fluid/weight has been fluctuating a lot.  Her weight will sometimes be up 3 to 5 pounds in 1 day and then go back down.  She was trying to limit her salt intake.  She also wears compression stockings which she states work  okay but not great.  She also noted worsening palpitations at night and has been taking her as needed Lopressor  almost every night for the last 3 weeks.  No significant palpitations during the day.  2D echo ordered along with BNP; brief increase in furosemide  dose recommended and allowing for sliding scale for weight gain or worsening edema. Started low-dose Toprol  12.5 mg (converting from PRN Lopressor )  Subjective  Discussed the use of AI scribe software for clinical note transcription with the patient, who gave verbal consent to proceed.  History of Present Illness [name] is a 68 year old female who presents with increased shortness of breath and palpitations.  She experiences ongoing shortness of breath, which has worsened over time, now occurring even during routine activities such as walking into a grocery store. The sensation is described as chest tightness, like a 'balloon that's getting ready to bust', and her legs feeling very heavy. This shortness of breath occurs daily and sometimes wakes her up at night, requiring her to sit up on the side of the bed to catch her breath. She uses inhalers to manage her symptoms and takes furosemide  twice a day on some days to help with leg swelling.  She experiences sporadic palpitations lasting about fifteen minutes, occurring every other day over the past two weeks. The sensation is described as her heart beating really fast, with pressure, and sometimes feeling like it's skipping  beats or 'caught in the middle of a hiccup'. She has been on metoprolol  succinate, which was initially prescribed to help manage these symptoms, but she has noticed an increase in frequency recently. She also takes phentermine , which can cause palpitations, but she did not initially experience issues with it.  She mentions having an ear infection a few weeks ago and has used methocarbamol  for pain relief recently. She also had injections for a shoulder abscess. She  sleeps on one pillow and sometimes wakes up at night due to breathing difficulties. She tries to stay active by pulling weeds but finds herself limited by her symptoms.   Cardiovascular ROS: positive for - chest pain, dyspnea on exertion, edema, palpitations, and rapid heart rate negative for - orthopnea, paroxysmal nocturnal dyspnea, shortness of breath, or syncope/near syncope or TIA/amaurosis fugax, claudication    Objective   Pertinent cardiac medications: Toprol  12.5 mg daily and furosemide  40 mg daily. Also notable medications phentermine  and albuterol   Current Meds  Medication Sig   albuterol  (VENTOLIN  HFA) 108 (90 Base) MCG/ACT inhaler Inhale 2 puffs into the lungs every 4 (four) hours as needed for wheezing or shortness of breath.   amitriptyline  (ELAVIL ) 75 MG tablet 1-2 TAB BY MOUTH ONCE AT BEDTIME AS NEEDED   calcium  carbonate (TUMS - DOSED IN MG ELEMENTAL CALCIUM ) 500 MG chewable tablet Chew 1 tablet by mouth as needed for indigestion or heartburn.   Cholecalciferol  50 MCG (2000 UT) TABS 1 tab by mouth once daily   clobetasol  cream (TEMOVATE ) 0.05 % USE 1 APPLICATION SPARINGLY ONCE A DAY AS NEEDED TO RED ITCHY AREAS. DO NOT USE ON FACE, NECK, GROIN, OR ARMPIT REGION. DO NOT USE LONGER THAN 2 WEEKS IN A ROW   docusate sodium (COLACE) 100 MG capsule Take 100 mg by mouth 2 (two) times daily as needed for mild constipation.   EPINEPHrine  0.3 mg/0.3 mL IJ SOAJ injection Inject into the muscle.   estradiol  (ESTRACE ) 0.1 MG/GM vaginal cream Insert blueberry-size amount of cream into vagina before bed twice a week   famotidine (PEPCID) 20 MG tablet Take 20 mg by mouth 2 (two) times daily. (Patient taking differently: Take 20 mg by mouth at bedtime.)   furosemide  (LASIX ) 40 MG tablet Take 1 tablet (40 mg total) by mouth every evening. MAY TAKE EXTRA FOR WEIGHT GAIN >3 lbs OVERNIGHT OR >5 lbs WEEKLY   hydrOXYzine  (ATARAX ) 10 MG tablet Take 1 tablet (10 mg total) by mouth 3 (three) times  daily as needed.   ibuprofen  (ADVIL ) 200 MG tablet Take 200 mg by mouth every 6 (six) hours as needed.   levothyroxine  (SYNTHROID ) 100 MCG tablet Take 1 tablet (100 mcg total) by mouth every other day.   levothyroxine  (SYNTHROID ) 112 MCG tablet Take 1 tablet (112 mcg total) by mouth daily.   loratadine  (CLARITIN ) 10 MG tablet Take 10-20 mg by mouth 2 (two) times daily as needed for allergies.   methocarbamol  (ROBAXIN ) 500 MG tablet Take 500 mg by mouth every 6 (six) hours as needed for muscle spasms.   ondansetron  (ZOFRAN -ODT) 4 MG disintegrating tablet Take 1 tablet (4 mg total) by mouth every 6 (six) hours as needed for nausea and vomiting   pantoprazole  (PROTONIX ) 40 MG tablet TAKE 1 TABLET BY MOUTH EVERY DAY   phentermine  30 MG capsule Take 1 capsule (30 mg total) by mouth every morning.   pimecrolimus  (ELIDEL ) 1 % cream Apply topically 2 (two) times daily. (Patient taking differently: Apply topically 2 (two) times  daily as needed.)   rosuvastatin  (CRESTOR ) 40 MG tablet TAKE 1 TABLET BY MOUTH EVERY DAY   triamcinolone  ointment (KENALOG ) 0.1 % Apply topically to the affected area 2 (two) times daily as needed.   Vaginal Lubricant (REPLENS VA) Place vaginally.   WIXELA INHUB  250-50 MCG/ACT AEPB INHALE 1 PUFF INTO THE LUNGS IN THE MORNING AND AT BEDTIME.   [Refilled] metoprolol  succinate (TOPROL  XL) 25 MG 24 hr tablet Take 1/2 tablet (12.5 mg total) by mouth every evening.    Studies Reviewed: SABRA   EKG Interpretation Date/Time:  Wednesday November 21 2023 10:28:32 EDT Ventricular Rate:  95 PR Interval:  146 QRS Duration:  90 QT Interval:  374 QTC Calculation: 469 R Axis:   -7  Text Interpretation: Normal sinus rhythm Normal ECG When compared with ECG of 22-Aug-2023 09:40, No significant change was found Confirmed by Anner Lenis ([id number]) on 11/21/2023 11:19:10 AM    Lab Results  Component Value Date   CHOL 116 09/24/2023   HDL 61.90 09/24/2023   LDLCALC 38 09/24/2023   TRIG 80.0  09/24/2023   CHOLHDL 2 09/24/2023   Lab Results  Component Value Date   NA 140 09/24/2023   K 3.8 09/24/2023   CREATININE 0.80 09/24/2023   GFR 77.05 09/24/2023   GLUCOSE 83 09/24/2023   Lab Results  Component Value Date   HGBA1C 5.8 09/24/2023   Remote Studies: Echo in 2013 showed LVEF of 55-60% with normal wall motion and normal diastolic function.  Echo in 07/2021 showed LVEF of 65-70% with normal wall motion and grade 1 diastolic dysfunction, normal RV function, and no significant valvular disease. Coronary CTA in 02/2017 showed coronary calcium  score of 34 isolated to the proximal LAD (placing patient in the 82nd percentile for age and sex) with less than 30% calcified plaque in the proximal LAD.   Event monitor ordered in 02/2018 showed underlying sinus rhythm with occasional PACs/PVCs but no concerning arrhythmias.     New studies ECHO: (09/26/2023) 1. Left ventricular ejection fraction, by estimation, is 70 to 75%. Left ventricular ejection fraction by 3D volume is 73 %. The left ventricle has hyperdynamic function. The left ventricle has no regional wall motion abnormalities. Left ventricular diastolic parameters are consistent with Grade I diastolic dysfunction (impaired relaxation).   2. Right ventricular systolic function is normal. The right ventricular size is normal. There is normal pulmonary artery systolic pressure.   3. The mitral valve is normal in structure. No evidence of mitral valve regurgitation. No evidence of mitral stenosis.   4. The aortic valve is tricuspid. Aortic valve regurgitation is not visualized. No aortic stenosis is present.   5. The inferior vena cava is normal in size with greater than 50% respiratory variability, suggesting right atrial pressure of 3 mmHg.   Cor CTA (07/2021) 1. Coronary calcium  score of 59.1. This was 87 percentile for age and sex matched control.  2. Normal coronary origin with right dominance.  3. CAD-RADS 2. Mild  non-obstructive CAD (25-49%). Consider non-atherosclerotic causes of chest pain. Consider preventive therapy and risk factor modification.   Zio Patch 2 days and 23 hours (07/2021): Overall relatively benign findings with short 5 to 10-second bursts of premature beats.     Predominant underlying rhythm: Sinus Rhythm-HR range 67-133 bpm, AVG 91 bpm.;   Rare isolated PACs (with some couplets and triplets), rare isolated PVCs   5 Atrial Tachycardia (Nonsustained Paroxysmal Supraventricular Tachycardia) Runs: Fastest and longest-30 beats (11.4 sec) with average rate 157 bpm (  range 100-197 BPM);   Only 2 triggered events were noted with 1 diary entry.  These were both in sinus rhythm.   No sustained tachycardia or bradycardia arrhythmias.  No pauses.  No sustained paroxysmal ventricular tachycardia, atrial flutter, atrial fibrillation.    Risk Assessment/Calculations:         Physical Exam:   VS:  BP 110/60   Pulse 96   Ht 5' 2 (1.575 m)   Wt 190 lb (86.2 kg)   BMI 34.75 kg/m    Wt Readings from Last 3 Encounters:  11/21/23 190 lb (86.2 kg)  11/06/23 192 lb 12.8 oz (87.5 kg)  09/25/23 197 lb 12.8 oz (89.7 kg)    GEN: Well nourished, well groomed in no acute distress; moderately obese but otherwise healthy-appearing NECK: No JVD; No carotid bruits CARDIAC: Normal S1, S2; RRR, no murmurs, rubs, gallops RESPIRATORY:  Clear to auscultation without rales, wheezing or rhonchi ; nonlabored, good air movement. ABDOMEN: Soft, non-tender, non-distended EXTREMITIES:  No edema; No deformity      ASSESSMENT AND PLAN: .    Problem List Items Addressed This Visit       Cardiology Problems   Elevated coronary artery calcium  score (Chronic)   Mildly elevated CAC with minimal LAD disease.  Recommend aggressive risk factor modification.  With her worsening exertional dyspnea not unreasonable to evaluate with stress test.  -Lipids well-controlled with an LDL 38 on 40 mg rosuvastatin  -BP adequately  controlled with minimal dose of Toprol  12.5 mg daily actually being used for palpitations -(Had previously been on Lopressor  25 mg twice daily so low threshold to titrate Toprol  further)      Relevant Medications   metoprolol  succinate (TOPROL  XL) 25 MG 24 hr tablet   Other Relevant Orders   EKG 12-Lead (Completed)   Hyperlipidemia LDL goal <70 (Chronic)   Lipids well-controlled with an LDL of 45 on 40 mg rosuvastatin .  Labs followed by PCP.  Continue rosuvastatin .      Relevant Medications   metoprolol  succinate (TOPROL  XL) 25 MG 24 hr tablet   Other Relevant Orders   EKG 12-Lead (Completed)   Varicose veins of bilateral lower extremities with other complications (Chronic)   Overall stable. Edema improved with increased furosemide  use. Compression stockings and leg elevation provide relief. Likely related to heart function and fluid retention. - Continue furosemide  as needed, up to twice daily. - Continue compression stockings and leg elevation.       Relevant Medications   metoprolol  succinate (TOPROL  XL) 25 MG 24 hr tablet     Other   DOE (dyspnea on exertion) - Primary   Increasing exertional dyspnea and chest tightness with daily activities. Echocardiogram shows hyperdynamic heart function, EF 70-75%, minor abnormal relaxation. Differential includes cardiac, pulmonary, or deconditioning causes. - Order cardiopulmonary exercise test. - Hold metoprolol  night before test, resume after. - Increase metoprolol  succinate to 25 mg at night.  Reactive airway disease may contribute to dyspnea. Inhalers provide relief. Previous spirometry showed improvement. - Order cardiopulmonary exercise test to assess lung function.      Relevant Orders   Cardiopulmonary exercise test   Obesity   Relevant Medications   calcium  carbonate (TUMS - DOSED IN MG ELEMENTAL CALCIUM ) 500 MG chewable tablet   Rapid palpitations (Chronic)   Has had multiple different monitors ordered but now symptoms  seem to be worsening. Palpitations now occurring every other day, fast heartbeats lasting 15 minutes. Previous monitoring showed short episodes of increased heart rate. Phentermine   may contribute. - Increase metoprolol  succinate to 25 mg at night. - Consider discontinuing phentermine  if palpitations worsen.            Informed Consent   Shared Decision Making/Informed Consent{ The risks [chest pain, shortness of breath, cardiac arrhythmias, dizziness, blood pressure fluctuations, myocardial infarction, stroke/transient ischemic attack, and life-threatening complications (estimated to be 1 in 10,000)], benefits (risk stratification, diagnosing coronary artery disease, treatment guidance) and alternatives of an exercise tolerance test were discussed in detail with [name] and she agrees to proceed.      Follow-Up: Return in about 4 months (around 03/23/2024) for [employer].      Signed, Alm MICAEL Clay, MD, MS Alm Clay, M.D., M.S. Interventional Chartered certified accountant  Pager # [phone]

## 2023-11-22 ENCOUNTER — Other Ambulatory Visit (HOSPITAL_COMMUNITY): Payer: Self-pay

## 2023-11-22 ENCOUNTER — Other Ambulatory Visit: Payer: Self-pay

## 2023-11-23 ENCOUNTER — Other Ambulatory Visit: Payer: Self-pay

## 2023-11-23 ENCOUNTER — Other Ambulatory Visit (HOSPITAL_COMMUNITY): Payer: Self-pay

## 2023-11-23 ENCOUNTER — Other Ambulatory Visit (HOSPITAL_BASED_OUTPATIENT_CLINIC_OR_DEPARTMENT_OTHER): Payer: Self-pay

## 2023-11-24 ENCOUNTER — Encounter: Payer: Self-pay | Admitting: Cardiology

## 2023-11-24 NOTE — Assessment & Plan Note (Addendum)
 Increasing exertional dyspnea and chest tightness with daily activities. Echocardiogram shows hyperdynamic heart function, EF 70-75%, minor abnormal relaxation. Differential includes cardiac, pulmonary, or deconditioning causes. - Order cardiopulmonary exercise test. - Hold metoprolol  night before test, resume after. - Increase metoprolol  succinate to 25 mg at night.  Reactive airway disease may contribute to dyspnea. Inhalers provide relief. Previous spirometry showed improvement. - Order cardiopulmonary exercise test to assess lung function.

## 2023-11-24 NOTE — Assessment & Plan Note (Signed)
 Lipids well-controlled with an LDL of 45 on 40 mg rosuvastatin .  Labs followed by PCP.  Continue rosuvastatin .

## 2023-11-24 NOTE — Assessment & Plan Note (Addendum)
 Overall stable. Edema improved with increased furosemide  use. Compression stockings and leg elevation provide relief. Likely related to heart function and fluid retention. - Continue furosemide  as needed, up to twice daily. - Continue compression stockings and leg elevation.

## 2023-11-24 NOTE — Assessment & Plan Note (Signed)
 Has had multiple different monitors ordered but now symptoms seem to be worsening. Palpitations now occurring every other day, fast heartbeats lasting 15 minutes. Previous monitoring showed short episodes of increased heart rate. Phentermine  may contribute. - Increase metoprolol  succinate to 25 mg at night. - Consider discontinuing phentermine  if palpitations worsen.

## 2023-11-24 NOTE — Assessment & Plan Note (Signed)
 Mildly elevated CAC with minimal LAD disease.  Recommend aggressive risk factor modification.  With her worsening exertional dyspnea not unreasonable to evaluate with stress test.  -Lipids well-controlled with an LDL 38 on 40 mg rosuvastatin  -BP adequately controlled with minimal dose of Toprol  12.5 mg daily actually being used for palpitations -(Had previously been on Lopressor  25 mg twice daily so low threshold to titrate Toprol  further)

## 2023-11-26 ENCOUNTER — Other Ambulatory Visit (HOSPITAL_BASED_OUTPATIENT_CLINIC_OR_DEPARTMENT_OTHER): Payer: Self-pay

## 2023-11-27 ENCOUNTER — Other Ambulatory Visit (HOSPITAL_COMMUNITY): Payer: Self-pay

## 2023-11-27 ENCOUNTER — Telehealth: Payer: Self-pay | Admitting: Cardiology

## 2023-11-27 NOTE — Telephone Encounter (Signed)
 Called patient back about message. Informed patient that her AVS most have been a typo since Dr. Anner clearly just increased her Metoprolol  12.5 mg to 25 mg, per office visit notes on 11/21/23. Informed patient that Metoprolol  25 mg was sent in to take nightly.  Patient also wanted to know what to do to prepare for her CPX. Informed her of instructions, and patient verbalized understanding.

## 2023-11-27 NOTE — Telephone Encounter (Signed)
 Pt c/o medication issue:  1. Name of Medication:   metoprolol  succinate (TOPROL  XL) 25 MG 24 hr tablet Take 1 tablet (25 mg total) by mouth every evening.    2. How are you currently taking this medication (dosage and times per day)? Not sure   3. Are you having a reaction (difficulty breathing--STAT)? No   4. What is your medication issue? Pt states she noticed today that her AVS from appt 7/30 said increased metoprolol  to 50mg  daily but ov note says 25mg  daily. Please clarify which one she should be taking.

## 2023-12-03 ENCOUNTER — Other Ambulatory Visit (HOSPITAL_COMMUNITY): Payer: Self-pay

## 2023-12-04 ENCOUNTER — Ambulatory Visit (HOSPITAL_COMMUNITY): Attending: Cardiology

## 2023-12-04 DIAGNOSIS — R0609 Other forms of dyspnea: Secondary | ICD-10-CM | POA: Diagnosis not present

## 2023-12-04 DIAGNOSIS — Z79899 Other long term (current) drug therapy: Secondary | ICD-10-CM | POA: Insufficient documentation

## 2023-12-06 DIAGNOSIS — R0600 Dyspnea, unspecified: Secondary | ICD-10-CM | POA: Diagnosis not present

## 2023-12-10 ENCOUNTER — Telehealth: Payer: Self-pay | Admitting: Cardiology

## 2023-12-10 NOTE — Telephone Encounter (Signed)
  Patient is calling to follow up on her Cardiopulmonary test results

## 2023-12-10 NOTE — Telephone Encounter (Signed)
 Spoke with pt, aware will call her once testing is reviewed by provider.

## 2023-12-11 ENCOUNTER — Encounter (HOSPITAL_COMMUNITY): Payer: Self-pay

## 2023-12-11 ENCOUNTER — Other Ambulatory Visit: Payer: Self-pay

## 2023-12-11 ENCOUNTER — Other Ambulatory Visit (HOSPITAL_COMMUNITY): Payer: Self-pay

## 2023-12-11 DIAGNOSIS — R051 Acute cough: Secondary | ICD-10-CM | POA: Diagnosis not present

## 2023-12-11 DIAGNOSIS — R062 Wheezing: Secondary | ICD-10-CM | POA: Diagnosis not present

## 2023-12-11 MED ORDER — BENZONATATE 100 MG PO CAPS
ORAL_CAPSULE | ORAL | 0 refills | Status: DC
  Filled 2023-12-11: qty 20, 7d supply, fill #0

## 2023-12-11 MED ORDER — PREDNISONE 20 MG PO TABS
ORAL_TABLET | ORAL | 0 refills | Status: DC
  Filled 2023-12-11: qty 10, 5d supply, fill #0

## 2023-12-12 DIAGNOSIS — H25813 Combined forms of age-related cataract, bilateral: Secondary | ICD-10-CM | POA: Diagnosis not present

## 2023-12-13 ENCOUNTER — Other Ambulatory Visit (HOSPITAL_COMMUNITY): Payer: Self-pay

## 2023-12-18 ENCOUNTER — Ambulatory Visit: Payer: Self-pay | Admitting: Cardiology

## 2023-12-18 DIAGNOSIS — R0609 Other forms of dyspnea: Secondary | ICD-10-CM

## 2023-12-19 NOTE — Progress Notes (Signed)
 Last read by Rock MARLA Shams at 11:38PM on 12/18/2023.

## 2023-12-19 NOTE — Telephone Encounter (Signed)
 Should not have any issue with eye surgeries.  I do think that if you wanted to answer the question about the shortness of breath that the tests being done with the psych or grandmother is probably better for you if that issue with walking.  However, if you are not likely to be able to exercise even with a bicycle, then I think we would want to switch to an imaging study like a Myoview  stress test which can be done with a chemical stress.  The main question is do think thank you would be able to exercise on the bicycle ergometer?  If not, then if you want to answer the question we may need to go to a Lexiscan  Myoview  stress test.  Alm Clay, MD

## 2023-12-21 NOTE — Telephone Encounter (Signed)
 Reena I think we can just change the Lexiscan  Myoview .  Joesphine just including you as a FYI).

## 2023-12-22 NOTE — Telephone Encounter (Signed)
 Patient originally scheduled for CPX.  This was performed but showed equivocal findings due to inability to achieve adequate exercise.  She indicated she would likely not be able to perform well on the bicycle either.  We therefore chose to move to a imaging study considering Lexiscan  Myoview  to allow for pharmacologic testing.   Informed Consent   Shared Decision Making/Informed Consent The risks [chest pain, shortness of breath, cardiac arrhythmias, dizziness, blood pressure fluctuations, myocardial infarction, stroke/transient ischemic attack, nausea, vomiting, allergic reaction, radiation exposure, metallic taste sensation and life-threatening complications (estimated to be 1 in 10,000)], benefits (risk stratification, diagnosing coronary artery disease, treatment guidance) and alternatives of a nuclear stress test were discussed in detail with Ms. Tegethoff and she agrees to proceed.      Alm Clay, MD

## 2023-12-26 ENCOUNTER — Telehealth (HOSPITAL_COMMUNITY): Payer: Self-pay | Admitting: *Deleted

## 2023-12-26 NOTE — Telephone Encounter (Signed)
 Left message on voicemail per DPR in reference to upcoming appointment scheduled on 01/01/2024 at 7:15 with detailed instructions given per Myocardial Perfusion Study Information Sheet for the test. LM to arrive 15 minutes early, and that it is imperative to arrive on time for appointment to keep from having the test rescheduled. If you need to cancel or reschedule your appointment, please call the office within 24 hours of your appointment. Failure to do so may result in a cancellation of your appointment, and a $50 no show fee. Phone number given for call back for any questions.

## 2023-12-31 ENCOUNTER — Other Ambulatory Visit (HOSPITAL_COMMUNITY): Payer: Self-pay

## 2023-12-31 ENCOUNTER — Telehealth: Payer: Self-pay | Admitting: Cardiology

## 2023-12-31 ENCOUNTER — Other Ambulatory Visit: Payer: Self-pay

## 2023-12-31 ENCOUNTER — Other Ambulatory Visit: Payer: Self-pay | Admitting: Cardiology

## 2023-12-31 DIAGNOSIS — R0609 Other forms of dyspnea: Secondary | ICD-10-CM

## 2023-12-31 NOTE — Telephone Encounter (Signed)
 Spoke with the patient and advised that she is fine to take medication for pain.

## 2023-12-31 NOTE — Telephone Encounter (Signed)
 Pt would like a c/b regarding whether she is able to take Tylenol  or Ibuprofen  if need be before test tomorrow. Please advise

## 2024-01-01 ENCOUNTER — Other Ambulatory Visit (HOSPITAL_COMMUNITY): Payer: Self-pay

## 2024-01-01 ENCOUNTER — Ambulatory Visit (HOSPITAL_COMMUNITY)
Admission: RE | Admit: 2024-01-01 | Discharge: 2024-01-01 | Discharge status: Home / Self Care | Source: Ambulatory Visit | Attending: Cardiology | Admitting: Cardiology

## 2024-01-01 DIAGNOSIS — R0609 Other forms of dyspnea: Secondary | ICD-10-CM | POA: Diagnosis not present

## 2024-01-01 LAB — MYOCARDIAL PERFUSION IMAGING
LV dias vol: 84 mL (ref 46–106)
LV sys vol: 24 mL (ref 3.8–5.2)
Nuc Stress EF: 71 %
Peak HR: 89 {beats}/min
Rest HR: 75 {beats}/min
Rest Nuclear Isotope Dose: 9.5 mCi
SDS: 0
SRS: 7
SSS: 0
ST Depression (mm): 0 mm
Stress Nuclear Isotope Dose: 32 mCi
TID: 1.17

## 2024-01-01 MED ORDER — TECHNETIUM TC 99M TETROFOSMIN IV KIT
9.5000 | PACK | Freq: Once | INTRAVENOUS | Status: AC | PRN
  Administered 2024-01-01: 9.5 via INTRAVENOUS

## 2024-01-01 MED ORDER — REGADENOSON 0.4 MG/5ML IV SOLN
0.4000 mg | Freq: Once | INTRAVENOUS | Status: AC
  Administered 2024-01-01: 0.4 mg via INTRAVENOUS

## 2024-01-01 MED ORDER — REGADENOSON 0.4 MG/5ML IV SOLN
INTRAVENOUS | Status: AC
Start: 2024-01-01 — End: 2024-01-01
  Filled 2024-01-01: qty 5

## 2024-01-01 MED ORDER — TECHNETIUM TC 99M TETROFOSMIN IV KIT
32.0000 | PACK | Freq: Once | INTRAVENOUS | Status: AC | PRN
  Administered 2024-01-01: 32 via INTRAVENOUS

## 2024-01-05 ENCOUNTER — Ambulatory Visit: Payer: Self-pay | Admitting: Cardiology

## 2024-01-07 ENCOUNTER — Encounter: Payer: Self-pay | Admitting: Cardiology

## 2024-01-10 ENCOUNTER — Ambulatory Visit: Payer: Self-pay | Admitting: Cardiology

## 2024-01-21 ENCOUNTER — Other Ambulatory Visit

## 2024-01-21 ENCOUNTER — Other Ambulatory Visit (HOSPITAL_COMMUNITY): Payer: Self-pay

## 2024-01-21 ENCOUNTER — Ambulatory Visit (INDEPENDENT_AMBULATORY_CARE_PROVIDER_SITE_OTHER): Admitting: Family Medicine

## 2024-01-21 ENCOUNTER — Encounter: Payer: Self-pay | Admitting: Family Medicine

## 2024-01-21 ENCOUNTER — Ambulatory Visit: Payer: Self-pay

## 2024-01-21 VITALS — BP 110/64 | HR 82 | Temp 98.4°F | Resp 20 | Ht 62.0 in | Wt 194.0 lb

## 2024-01-21 DIAGNOSIS — Z23 Encounter for immunization: Secondary | ICD-10-CM

## 2024-01-21 DIAGNOSIS — Z711 Person with feared health complaint in whom no diagnosis is made: Secondary | ICD-10-CM

## 2024-01-21 DIAGNOSIS — M542 Cervicalgia: Secondary | ICD-10-CM | POA: Diagnosis not present

## 2024-01-21 DIAGNOSIS — K921 Melena: Secondary | ICD-10-CM

## 2024-01-21 DIAGNOSIS — L255 Unspecified contact dermatitis due to plants, except food: Secondary | ICD-10-CM | POA: Diagnosis not present

## 2024-01-21 MED ORDER — PREDNISONE 10 MG (21) PO TBPK
ORAL_TABLET | ORAL | 0 refills | Status: DC
  Filled 2024-01-21: qty 21, fill #0
  Filled 2024-01-21: qty 21, 6d supply, fill #0

## 2024-01-21 NOTE — Telephone Encounter (Signed)
 FYI Only or Action Required?: FYI only for provider.  Patient was last seen in primary care on 11/06/2023 by [name] ORN, MD.  Called Nurse Triage reporting Rash, Neck Pain, and Blood Pressure Concerns.  Symptoms began different times noted in assessment.  Interventions attempted: OTC medications: hemorrhoids cream and an allergy med from previous doctor for her skin rash and Rest, hydration, or home remedies.  Symptoms are: variable.  Triage Disposition: See PCP When Office is Open (Within 3 Days), See Physician Within 24 Hours  Patient/caregiver understands and will follow disposition?: Yes                       Copied from CRM #[id number]. Topic: Clinical - Red Word Triage >> Jan 21, 2024  7:53 AM Mesmerise C wrote: [facility] that prompted transfer to Nurse Triage: Patient stated she's had sharp neck pain it's sporadic, also has had a rash on her stomach it started out as 2 dots on her stomach and has been spreading since Reason for Disposition  [1] Systolic BP 90-110 AND [2] taking blood pressure medications AND [3] NOT feeling weak or lightheaded  Mild widespread rash  (Exception: Heat rash lasting 3 days or less.)  Answer Assessment - Initial Assessment Questions 1. ONSET: When did the pain begin?      Several weeks ago 2. LOCATION: Where does it hurt?      Left side of neck 3. PATTERN Does the pain come and go, or has it been constant since it started?      Comes and goes 4. SEVERITY: How bad is the pain?  (Scale 0-10; or none or slight stiffness, mild, moderate, severe)     5 5. RADIATION: Does the pain go anywhere else, shoot into your arms?     Left side of neck 6. CORD SYMPTOMS: Any weakness or numbness of the arms or legs?     ----- 7. CAUSE: What do you think is causing the neck pain?     unsure 8. NECK OVERUSE: Any recent activities that involved turning or twisting the neck?     no 9. OTHER SYMPTOMS: Do you have any other  symptoms? (e.g., headache, fever, chest pain, difficulty breathing, neck swelling)    Rash that started last week  Answer Assessment - Initial Assessment Questions Patient has multiple complaints  Denies chest pain  Rash---wed last week, two dots an inch apart on abdomen and then right forearm has a bigger place, face Was taking allergy shots until January Areas itch at times No pain Medication from allergy doctor--hasn't helped   Neck pain--house nurse comes with her insurance  Insurance nurse came 3 days ago and found her blood pressure drops when standing up Sitting 120/90 Standing 104/90 Drinks plenty of water 107/62 pulse of 76 sitting at 8:12am 98/66 pulse of 84 standing at 8:16am Denies any dizziness, light headedness, or feeling bad when standing  Neck pain Comes and goes Pain x several weeks Tylenol  or Ibuprofen   Blood in stool Got hemorrhoid cream Cranberry red colored 2 days ago Only seen a little since then Hasn't happened since   Patient is advised that if anything worsens to go to the Emergency Room. Patient verbalized understanding.     1. APPEARANCE of RASH: What does the rash look like? (e.g., blisters, dry flaky skin, red spots, redness, sores)     Red slightly raised 2. SIZE: How big are the spots? (e.g., tip of pen, eraser, coin; inches,  centimeters)     varies 3. LOCATION: Where is the rash located?      two dots an inch apart on abdomen and then right forearm has a bigger place, face 4. COLOR: What color is the rash? (Note: It is difficult to assess rash color in people with darker-colored skin. When this situation occurs, simply ask the caller to describe what they see.)     red 5. ONSET: When did the rash begin?     Last week after being outside 6. FEVER: Do you have a fever? If Yes, ask: What is your temperature, how was it measured, and when did it start?     denies 7. ITCHING: Does the rash itch? If Yes, ask: How bad  is the itch? (Scale 1-10; or mild, moderate, severe)     A little--pt used allergy med from previous allergy doctor but it didn't help 8. CAUSE: What do you think is causing the rash?     unsure 9. MEDICINE FACTORS: Have you started any new medicines within the last 2 weeks? (e.g., antibiotics)      ----- 10. OTHER SYMPTOMS: Do you have any other symptoms? (e.g., dizziness, headache, sore throat, joint pain)   -----  Protocols used: Rash or Redness - Widespread-A-AH, Neck Pain or Stiffness-A-AH, Blood Pressure - Low-A-AH

## 2024-01-21 NOTE — Patient Instructions (Signed)
 Tylenol  3,000 mg max in a day which could be 1,000 mg (2 tablets) three times per day You can increase Ibuprofen  to 2-3 tablets three times per day as needed.

## 2024-01-21 NOTE — Progress Notes (Signed)
 Assessment & Plan Rhus dermatitis Rash likely due to yard exposure. Orders:   predniSONE  (STERAPRED UNI-PAK 21 TAB) 10 MG (21) TBPK tablet; Use as directed.  Musculoskeletal neck pain No imaging needed as pain is muscular.  - Use muscle rub like Biofreeze, heating pad, or massage for relief. - Continue Tylenol  and ibuprofen  as needed. Orders:   predniSONE  (STERAPRED UNI-PAK 21 TAB) 10 MG (21) TBPK tablet; Use as directed.  Frank blood in stool Need to rule out ongoing bleeding. Orders:   Fecal occult blood, imunochemical; Future  Person with feared complaint in whom no diagnosis is made Orthostatic vitals are normal.     Encounter for immunization  Orders:   Flu vaccine HIGH DOSE PF(Fluzone Trivalent)    Follow up plan: Return if symptoms worsen or fail to improve.  Niki Rung, MSN, APRN, FNP-C  Subjective:  HPI: Discussed the use of AI scribe software for clinical note transcription with the patient, who gave verbal consent to proceed.  History of Present Illness   [name] is a 68 year old female who presents with a rash, neck pain, and rectal bleeding.  She developed a rash after working in the yard on Wednesday. The rash initially appeared on her arms, legs, and abdomen, but is less prominent on her legs. The rash occasionally itches but does not blister. She initially applied calamine lotion without relief and then used clobetasol  cream.  She reports neck pain that began approximately a week and a half ago. The pain is described as sharp and occurs with certain movements, such as bending over. It is localized to the back of her neck and feels like a spasm, with tenderness in the area. The pain is aggravated by bending over to touch her toes. She has been managing the pain with extra strength Tylenol  (500 mg) and ibuprofen  (200 mg), taking one of each.  She experienced rectal bleeding on Thursday, noticing dark red blood in the toilet, described as  'cranberry' colored. She has not had a bowel movement for three days since this event. She has a history of hemorrhoids, particularly after surgeries, but has not had issues with them recently. She used Preparation H after the bleeding episode, which has not recurred, except for a small smear. Her last colonoscopy was last year, which was normal.  She mentions a history of blood pressure dropping upon standing, which was noted by a nurse during a home visit. She experienced a sensation of 'something just stabbing' in her neck during these episodes, but did not report dizziness.       ROS: Negative unless specifically indicated above in HPI.   Relevant past medical history reviewed and updated as indicated.   Allergies and medications reviewed and updated.   Current Outpatient Medications:    albuterol  (VENTOLIN  HFA) 108 (90 Base) MCG/ACT inhaler, Inhale 2 puffs into the lungs every 4 (four) hours as needed for wheezing or shortness of breath., Disp: 18 g, Rfl: 3   amitriptyline  (ELAVIL ) 75 MG tablet, 1-2 TAB BY MOUTH ONCE AT BEDTIME AS NEEDED, Disp: 180 tablet, Rfl: 2   calcium  carbonate (TUMS - DOSED IN MG ELEMENTAL CALCIUM ) 500 MG chewable tablet, Chew 1 tablet by mouth as needed for indigestion or heartburn., Disp: , Rfl:    Cholecalciferol  50 MCG (2000 UT) TABS, 1 tab by mouth once daily, Disp: 30 tablet, Rfl: 99   clobetasol  cream (TEMOVATE ) 0.05 %, USE 1 APPLICATION SPARINGLY ONCE A DAY AS NEEDED TO RED ITCHY  AREAS. DO NOT USE ON FACE, NECK, GROIN, OR ARMPIT REGION. DO NOT USE LONGER THAN 2 WEEKS IN A ROW, Disp: 30 g, Rfl: 2   docusate sodium (COLACE) 100 MG capsule, Take 100 mg by mouth 2 (two) times daily as needed for mild constipation., Disp: , Rfl:    EPINEPHrine  0.3 mg/0.3 mL IJ SOAJ injection, Inject into the muscle., Disp: , Rfl:    estradiol  (ESTRACE ) 0.1 MG/GM vaginal cream, Insert blueberry-size amount of cream into vagina before bed twice a week, Disp: 42.5 g, Rfl: 6    famotidine (PEPCID) 20 MG tablet, Take 20 mg by mouth 2 (two) times daily. (Patient taking differently: Take 20 mg by mouth at bedtime.), Disp: , Rfl:    furosemide  (LASIX ) 40 MG tablet, Take 1 tablet (40 mg total) by mouth every evening. MAY TAKE EXTRA FOR WEIGHT GAIN >3 lbs OVERNIGHT OR >5 lbs WEEKLY, Disp: 90 tablet, Rfl: 1   hydrOXYzine  (ATARAX ) 10 MG tablet, Take 1 tablet (10 mg total) by mouth 3 (three) times daily as needed., Disp: 90 tablet, Rfl: 5   ibuprofen  (ADVIL ) 200 MG tablet, Take 200 mg by mouth every 6 (six) hours as needed., Disp: , Rfl:    levothyroxine  (SYNTHROID ) 100 MCG tablet, Take 1 tablet (100 mcg total) by mouth every other day., Disp: 45 tablet, Rfl: 3   levothyroxine  (SYNTHROID ) 112 MCG tablet, Take 1 tablet (112 mcg total) by mouth daily. (Patient taking differently: Take 112 mcg by mouth every other day.), Disp: 90 tablet, Rfl: 3   loratadine  (CLARITIN ) 10 MG tablet, Take 10-20 mg by mouth 2 (two) times daily as needed for allergies., Disp: , Rfl:    metoprolol  succinate (TOPROL  XL) 25 MG 24 hr tablet, Take 1 tablet (25 mg total) by mouth every evening., Disp: 90 tablet, Rfl: 3   pantoprazole  (PROTONIX ) 40 MG tablet, TAKE 1 TABLET BY MOUTH EVERY DAY, Disp: 90 tablet, Rfl: 3   phentermine  30 MG capsule, Take 1 capsule (30 mg total) by mouth every morning., Disp: 90 capsule, Rfl: 1   pimecrolimus  (ELIDEL ) 1 % cream, Apply topically 2 (two) times daily., Disp: 30 g, Rfl: 5   predniSONE  (STERAPRED UNI-PAK 21 TAB) 10 MG (21) TBPK tablet, Use as directed., Disp: 21 each, Rfl: 0   rosuvastatin  (CRESTOR ) 40 MG tablet, TAKE 1 TABLET BY MOUTH EVERY DAY, Disp: 90 tablet, Rfl: 3   triamcinolone  ointment (KENALOG ) 0.1 %, Apply topically to the affected area 2 (two) times daily as needed., Disp: 80 g, Rfl: 1   Vaginal Lubricant (REPLENS VA), Place vaginally., Disp: , Rfl:    WIXELA INHUB  250-50 MCG/ACT AEPB, INHALE 1 PUFF INTO THE LUNGS IN THE MORNING AND AT BEDTIME., Disp: 180 each,  Rfl: 1   benzonatate  (TESSALON ) 100 MG capsule, Take 1 capsule (100 mg total) by mouth 3 (three) times a day as needed for cough., Disp: 20 capsule, Rfl: 0   estradiol  (ESTRACE ) 0.1 MG/GM vaginal cream, INSERT BLUEBERRY SIZE AMOUNT OF CREAM INTO VAGINA BEFORE BED TWICE/WEEK (Patient not taking: Reported on 11/06/2023), Disp: 42.5 g, Rfl: 1   methocarbamol  (ROBAXIN ) 500 MG tablet, Take 500 mg by mouth every 6 (six) hours as needed for muscle spasms. (Patient not taking: Reported on 01/21/2024), Disp: , Rfl:    omalizumab  (XOLAIR ) 150 MG/ML prefilled syringe, Inject 300 mg into the skin every 14 (fourteen) days. (Patient not taking: Reported on 01/21/2024), Disp: 4 mL, Rfl: 11   ondansetron  (ZOFRAN -ODT) 4 MG disintegrating tablet, Take 1  tablet (4 mg total) by mouth every 6 (six) hours as needed for nausea and vomiting, Disp: 20 tablet, Rfl: 0  Current Facility-Administered Medications:    omalizumab  (XOLAIR ) injection 300 mg, 300 mg, Subcutaneous, Q28 days, Iva, Marty Saltness, MD, 300 mg at 01/19/21 9071  Allergies  Allergen Reactions   Amoxicillin Shortness Of Breath and Rash    REACTION: rash, sob - tol kelfex and rocephn hosp 06/2009 Has patient had a PCN reaction causing immediate rash, facial/tongue/throat swelling, SOB or lightheadedness with hypotension: YES Has patient had a PCN reaction causing severe rash involving mucus membranes or skin necrosis: YES Has patient had a PCN reaction that required hospitalization UNKNOWN Has patient had a PCN reaction occurring within the last 10 years: NO If all of the above answers are NO, then may proceed with Cephalosporin use   Aspirin  Shortness Of Breath and Rash    Tolerates ibuprofen  without issue   Latex Shortness Of Breath and Dermatitis    Blisters on skin   Sulfa Antibiotics Rash, Shortness Of Breath and Hives    But reports bactrim tolerence  Substance with sulfonamide structure and antibacterial mechanism of action (substance)    Tetracyclines & Related Hives, Shortness Of Breath and Itching   Nitrofuran Derivatives Hives and Itching   Avelox [Moxifloxacin Hcl In Nacl] Hives    TOLERATES CIPRO   Diphenhydramine  Palpitations   Misc. Sulfonamide Containing Compounds Rash and Hives    Objective:   BP 110/64   Pulse 82   Temp 98.4 F (36.9 C)   Resp 20   Ht 5' 2 (1.575 m)   Wt 194 lb (88 kg)   SpO2 97%   BMI 35.48 kg/m    Orthostatic VS for the past 72 hrs (Last 3 readings):  Orthostatic BP Patient Position BP Location Cuff Size Orthostatic Pulse  01/21/24 1017 105/72 Standing Right Arm Large 81  01/21/24 1016 111/75 Sitting Right Arm Large 81  01/21/24 1015 99/66 Supine Right Arm Large 75    Physical Exam Vitals reviewed.  Constitutional:      General: She is not in acute distress.    Appearance: Normal appearance. She is not ill-appearing, toxic-appearing or diaphoretic.  HENT:     Head: Normocephalic and atraumatic.  Eyes:     General: No scleral icterus.       Right eye: No discharge.        Left eye: No discharge.     Conjunctiva/sclera: Conjunctivae normal.  Cardiovascular:     Rate and Rhythm: Normal rate and regular rhythm.     Heart sounds: Normal heart sounds. No murmur heard.    No friction rub. No gallop.  Pulmonary:     Effort: Pulmonary effort is normal. No respiratory distress.     Breath sounds: Normal breath sounds. No stridor. No wheezing, rhonchi or rales.  Musculoskeletal:        General: Normal range of motion.     Cervical back: Normal range of motion. Muscular tenderness present. No spinous process tenderness.  Skin:    General: Skin is warm and dry.     Capillary Refill: Capillary refill takes less than 2 seconds.  Neurological:     General: No focal deficit present.     Mental Status: She is alert and oriented to person, place, and time. Mental status is at baseline.  Psychiatric:        Mood and Affect: Mood normal.        Behavior: Behavior normal.  Thought Content: Thought content normal.        Judgment: Judgment normal.

## 2024-01-22 ENCOUNTER — Other Ambulatory Visit (INDEPENDENT_AMBULATORY_CARE_PROVIDER_SITE_OTHER)

## 2024-01-22 ENCOUNTER — Telehealth: Payer: Self-pay

## 2024-01-22 DIAGNOSIS — K921 Melena: Secondary | ICD-10-CM | POA: Diagnosis not present

## 2024-01-22 LAB — FECAL OCCULT BLOOD, IMMUNOCHEMICAL: Fecal Occult Bld: POSITIVE — AB

## 2024-01-22 NOTE — Telephone Encounter (Signed)
 Copied from CRM [id number]. Topic: General - Other >> Jan 22, 2024  4:03 PM Thersia BROCKS wrote: Reason for CRM: Patient called in regarding lab results , would like a callback

## 2024-01-23 ENCOUNTER — Ambulatory Visit: Payer: Self-pay

## 2024-01-23 ENCOUNTER — Encounter: Payer: Self-pay | Admitting: Family Medicine

## 2024-01-23 NOTE — Telephone Encounter (Signed)
 FYI Only or Action Required?: Action required by provider: lab or test result follow-up needed.  Patient was last seen in primary care on 01/21/2024 by Merlynn Niki FALCON, FNP.  Called Nurse Triage reporting Results.  Symptoms began today.  Interventions attempted: Nothing.  Symptoms are: unchanged.  Triage Disposition: Call PCP Within 24 Hours  Patient/caregiver understands and will follow disposition?: Yes     Copied from CRM #[id number]. Topic: Clinical - Lab/Test Results >> Jan 23, 2024  3:16 PM Suzen RAMAN wrote: Reason for CRM:  Patient  has additional question about Fecal occult blood, imunochemical test. Reason for Disposition  Caller requesting lab results  (Exception: Routine or non-urgent lab result.)  Answer Assessment - Initial Assessment Questions 1. REASON FOR CALL or QUESTION: What is your reason for calling today? or How can I best     Pt got the results on mychart and wants to know what next steps are. Pt also sent a mychart message earlier today.  2. CALLER: Document the source of call. (e.g., laboratory staff, caregiver or patient).    patient  Protocols used: PCP Call - No Triage-A-AH

## 2024-01-25 NOTE — Telephone Encounter (Signed)
 Called and spoke with Pt , Pt states understanding no further questions at this time.

## 2024-01-25 NOTE — Telephone Encounter (Signed)
 Called and let Pt know, Pt states understanding no further questions at this time.

## 2024-01-25 NOTE — Telephone Encounter (Signed)
 Copied from CRM #[id number]. Topic: Clinical - Lab/Test Results >> Jan 25, 2024 11:37 AM Dedra B wrote: Reason for CRM: Pt is following up regarding her fecal occult blood steps and wants to know what are the next steps. Pls call.

## 2024-01-28 ENCOUNTER — Ambulatory Visit: Payer: Self-pay | Admitting: Family Medicine

## 2024-01-28 NOTE — Telephone Encounter (Signed)
 Patient has been advised via most recent telephone note

## 2024-01-29 ENCOUNTER — Ambulatory Visit: Admitting: Family Medicine

## 2024-01-29 ENCOUNTER — Encounter: Payer: Self-pay | Admitting: Family Medicine

## 2024-01-29 ENCOUNTER — Other Ambulatory Visit (HOSPITAL_COMMUNITY): Payer: Self-pay

## 2024-01-29 ENCOUNTER — Ambulatory Visit

## 2024-01-29 ENCOUNTER — Ambulatory Visit (INDEPENDENT_AMBULATORY_CARE_PROVIDER_SITE_OTHER)

## 2024-01-29 VITALS — BP 118/60 | HR 66 | Temp 98.7°F | Ht 63.5 in | Wt 190.0 lb

## 2024-01-29 VITALS — BP 118/60 | HR 66 | Ht 63.5 in | Wt 190.2 lb

## 2024-01-29 DIAGNOSIS — K921 Melena: Secondary | ICD-10-CM | POA: Diagnosis not present

## 2024-01-29 DIAGNOSIS — L299 Pruritus, unspecified: Secondary | ICD-10-CM | POA: Diagnosis not present

## 2024-01-29 DIAGNOSIS — M4802 Spinal stenosis, cervical region: Secondary | ICD-10-CM | POA: Diagnosis not present

## 2024-01-29 DIAGNOSIS — M542 Cervicalgia: Secondary | ICD-10-CM | POA: Diagnosis not present

## 2024-01-29 DIAGNOSIS — L0390 Cellulitis, unspecified: Secondary | ICD-10-CM | POA: Diagnosis not present

## 2024-01-29 DIAGNOSIS — Z0000 Encounter for general adult medical examination without abnormal findings: Secondary | ICD-10-CM

## 2024-01-29 DIAGNOSIS — M47812 Spondylosis without myelopathy or radiculopathy, cervical region: Secondary | ICD-10-CM | POA: Diagnosis not present

## 2024-01-29 MED ORDER — CEPHALEXIN 500 MG PO CAPS
500.0000 mg | ORAL_CAPSULE | Freq: Two times a day (BID) | ORAL | 0 refills | Status: AC
  Filled 2024-01-29: qty 14, 7d supply, fill #0

## 2024-01-29 MED ORDER — CLOBETASOL PROPIONATE 0.05 % EX CREA
1.0000 | TOPICAL_CREAM | Freq: Two times a day (BID) | CUTANEOUS | 0 refills | Status: DC
  Filled 2024-01-29: qty 30, 30d supply, fill #0
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
## 2024-01-29 NOTE — Progress Notes (Signed)
 Acute Office Visit  Subjective:     Patient ID: [name], female    DOB: [birth year], 68 y.o.   MRN: [id number]  Chief Complaint  Patient presents with   Rash    Bug bite a few weeks ago. Has a rash like on her right arm, stomach, and right side. Itching, has been trying cortisone with no relief.   Acute Visit    Neck and shoulder pain continuing, was seen for this on 09/29 also. Finished prednisone , is using tylenol  and ibuprofen  with minimal relief    HPI  Discussed the use of AI scribe software for clinical note transcription with the patient, who gave verbal consent to proceed.  History of Present Illness [name] is a 68 year old female who presents with persistent skin lesions and neck pain.  Cutaneous lesions and pruritus - Clusters of three open, oozy skin lesions located on the stomach and side - Lesions heal over a few days but recur in the same areas - Lesions are erythematous and sometimes leave brown scars after healing - Significant pruritus, especially nocturnal - Multiple topical creams used without relief  Neck pain - Sudden onset of deep neck pain - Pain is exacerbated by prolonged bending over - Current pain is more intense than previous episodes attributed to bone spurs - No prior imaging of the cervical spine - No associated chills or fever  Antibiotic allergies and tolerances - Allergic to amoxicillin, sulfa, and tetracycline - Tolerates Keflex      ROS Per HPI      Objective:    BP 118/60 (BP Location: Left Arm, Patient Position: Sitting)   Pulse 66   Temp 98.7 F (37.1 C) (Temporal)   Ht 5' 3.5 (1.613 m)   Wt 190 lb (86.2 kg)   SpO2 98%   BMI 33.13 kg/m    Physical Exam Vitals and nursing note reviewed.  Constitutional:      General: She is not in acute distress. HENT:     Head: Normocephalic and atraumatic.     Mouth/Throat:     Mouth: Mucous membranes are moist.     Pharynx: Oropharynx is clear.  Eyes:      Extraocular Movements: Extraocular movements intact.     Pupils: Pupils are equal, round, and reactive to light.  Neck:     Comments: L ROM to neck with side-to-side motion, no erythema, no bruising, no swelling, no obvious deformity Cardiovascular:     Rate and Rhythm: Normal rate and regular rhythm.     Pulses: Normal pulses.     Heart sounds: Normal heart sounds.  Pulmonary:     Effort: Pulmonary effort is normal. No respiratory distress.     Breath sounds: Normal breath sounds. No wheezing, rhonchi or rales.  Musculoskeletal:        General: Normal range of motion.     Right lower leg: No edema.     Left lower leg: No edema.  Lymphadenopathy:     Cervical: No cervical adenopathy.  Skin:    Comments: Right forearm with 5 x 4 cm area of erythema, warmth, tenderness consistent with acute cellulitis.  No discharge or bleeding.  Multiple scabs generalized over the trunk and arms that are consistent with bug bites  Neurological:     General: No focal deficit present.     Mental Status: She is alert and oriented to person, place, and time.  Psychiatric:        Mood and  Affect: Mood normal.        Thought Content: Thought content normal.     No results found for any visits on 01/29/24.      Assessment & Plan:   Assessment and Plan Assessment & Plan Acute cellulitis, pruritus Chronic erythematous skin lesions with possible infection. Previous minimal relief with steroids. Allergies to amoxicillin, sulfa, and tetracycline. Tolerant to Keflex . - Prescribed Keflex  500 mg twice daily for 7 days. - Prescribed stronger topical cream for pruritus.  Blood in stool Recent hematochezia with confirmed blood in stool sample. Gastroenterology evaluation pending. - Proceed with gastroenterology appointment.  Neck pain Intermittent deep neck pain, possibly related to cervical spine issues. History of fall in December. - Order cervical spine X-ray. - Consider referral to physical therapy  if X-ray is unremarkable.     Orders Placed This Encounter  Procedures   DG Cervical Spine Complete    Standing Status:   Future    Number of Occurrences:   1    Expiration Date:   01/28/2025    Reason for Exam (SYMPTOM  OR DIAGNOSIS REQUIRED):   neck pain    Preferred imaging location?:   [facility]     Meds ordered this encounter  Medications   cephALEXin  (KEFLEX ) 500 MG capsule    Sig: Take 1 capsule (500 mg total) by mouth 2 (two) times daily for 7 days.    Dispense:  14 capsule    Refill:  0   clobetasol  cream (TEMOVATE ) 0.05 %    Sig: Apply 1 Application topically 2 (two) times daily.    Dispense:  30 g    Refill:  0    Return if symptoms worsen or fail to improve.  Corean LITTIE Ku, FNP

## 2024-01-29 NOTE — Patient Instructions (Addendum)
 We are getting an xray today. We will be in contact with any abnormal results that require further attention.  I have sent in clobetasol  cream for you to use to your pharmacy.  You may mix this with equal parts Eucerin cream/Vaseline/Aquaphor and apply to affected area BID   I have sent in an antibiotic for you to take 1 tablet by mouth twice a day for 7 days.  Please eat with this medication, it can upset your stomach if you do not.  Take all of the medication even if you are feeling better.  Follow-up with me for new or worsening symptoms.

## 2024-01-29 NOTE — Patient Instructions (Signed)
 Ms. [name],  Thank you for taking the time for your Medicare Wellness Visit. I appreciate your continued commitment to your health goals. Please review the care plan we discussed, and feel free to reach out if I can assist you further.  Medicare recommends these wellness visits once per year to help you and your care team stay ahead of potential health issues. These visits are designed to focus on prevention, allowing your provider to concentrate on managing your acute and chronic conditions during your regular appointments.  Please note that Annual Wellness Visits do not include a physical exam. Some assessments may be limited, especially if the visit was conducted virtually. If needed, we may recommend a separate in-person follow-up with your provider.  Ongoing Care Seeing your primary care provider every 3 to 6 months helps us  monitor your health and provide consistent, personalized care.   Referrals If a referral was made during today's visit and you haven't received any updates within two weeks, please contact the referred provider directly to check on the status.  Recommended Screenings:  Health Maintenance  Topic Date Due   Zoster (Shingles) Vaccine (1 of 2) Never done   COVID-19 Vaccine (5 - Mixed Product risk 2024-25 season) 12/24/2023   Medicare Annual Wellness Visit  01/28/2025   Breast Cancer Screening  06/03/2025   DTaP/Tdap/Td vaccine (3 - Td or Tdap) 04/26/2028   Colon Cancer Screening  03/07/2033   Pneumococcal Vaccine for age over 23  Completed   Flu Shot  Completed   DEXA scan (bone density measurement)  Completed   Hepatitis C Screening  Completed   Meningitis B Vaccine  Aged Out       03/08/2023    6:54 AM  Advanced Directives  Does Patient Have a Medical Advance Directive? No   Advance Care Planning is important because it: Ensures you receive medical care that aligns with your values, goals, and preferences. Provides guidance to your family and loved  ones, reducing the emotional burden of decision-making during critical moments.  Vision: Annual vision screenings are recommended for early detection of glaucoma, cataracts, and diabetic retinopathy. These exams can also reveal signs of chronic conditions such as diabetes and high blood pressure.  Dental: Annual dental screenings help detect early signs of oral cancer, gum disease, and other conditions linked to overall health, including heart disease and diabetes.

## 2024-02-04 ENCOUNTER — Ambulatory Visit: Payer: Self-pay | Admitting: Family Medicine

## 2024-02-04 ENCOUNTER — Encounter: Payer: Self-pay | Admitting: Internal Medicine

## 2024-02-04 DIAGNOSIS — M542 Cervicalgia: Secondary | ICD-10-CM

## 2024-02-05 NOTE — Addendum Note (Signed)
 Addended by: NORLEEN LYNWOOD ORN on: 02/05/2024 10:40 AM   Modules accepted: Orders

## 2024-02-06 ENCOUNTER — Other Ambulatory Visit (HOSPITAL_COMMUNITY): Payer: Self-pay

## 2024-02-06 ENCOUNTER — Other Ambulatory Visit: Payer: Self-pay

## 2024-02-06 DIAGNOSIS — L810 Postinflammatory hyperpigmentation: Secondary | ICD-10-CM | POA: Diagnosis not present

## 2024-02-06 DIAGNOSIS — L308 Other specified dermatitis: Secondary | ICD-10-CM | POA: Diagnosis not present

## 2024-02-06 MED ORDER — TRIAMCINOLONE ACETONIDE 0.1 % EX OINT
1.0000 | TOPICAL_OINTMENT | Freq: Two times a day (BID) | CUTANEOUS | 5 refills | Status: AC | PRN
  Filled 2024-02-06 (×2): qty 80, 40d supply, fill #0
  Filled 2024-05-17: qty 80, 40d supply, fill #1

## 2024-02-07 ENCOUNTER — Encounter: Payer: Self-pay | Admitting: Orthopedic Surgery

## 2024-02-07 ENCOUNTER — Ambulatory Visit: Admitting: Orthopedic Surgery

## 2024-02-07 DIAGNOSIS — M47812 Spondylosis without myelopathy or radiculopathy, cervical region: Secondary | ICD-10-CM
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
## 2024-02-07 NOTE — Addendum Note (Signed)
 Addended by: Lochlann Mastrangelo on: 02/07/2024 03:54 PM   Modules accepted: Orders

## 2024-02-08 ENCOUNTER — Telehealth: Payer: Self-pay | Admitting: Orthopedic Surgery

## 2024-02-08 NOTE — Telephone Encounter (Signed)
 Pt called wanting to know if she needs to go off the bupropion and if she needs to take anything to be still during her MRI. She gets anxious and wants to know what she can take. Call back number is [phone].

## 2024-02-11 ENCOUNTER — Other Ambulatory Visit: Payer: Self-pay | Admitting: Surgical

## 2024-02-11 ENCOUNTER — Other Ambulatory Visit: Payer: Self-pay | Admitting: Internal Medicine

## 2024-02-11 ENCOUNTER — Other Ambulatory Visit (HOSPITAL_COMMUNITY): Payer: Self-pay

## 2024-02-11 MED ORDER — DIAZEPAM 5 MG PO TABS
ORAL_TABLET | ORAL | 0 refills | Status: DC
  Filled 2024-02-11: qty 2, 1d supply, fill #0

## 2024-02-11 NOTE — Telephone Encounter (Signed)
 Sent in

## 2024-02-11 NOTE — Telephone Encounter (Signed)
 Ok for valium  pls call thx

## 2024-02-12 ENCOUNTER — Other Ambulatory Visit (HOSPITAL_COMMUNITY): Payer: Self-pay

## 2024-02-12 ENCOUNTER — Other Ambulatory Visit: Payer: Self-pay

## 2024-02-12 MED ORDER — LEVOTHYROXINE SODIUM 100 MCG PO TABS
100.0000 ug | ORAL_TABLET | ORAL | 3 refills | Status: DC
  Filled 2024-02-12: qty 45, 90d supply, fill #0

## 2024-02-12 NOTE — Telephone Encounter (Signed)
 Called and advised pt.

## 2024-02-14 DIAGNOSIS — H25013 Cortical age-related cataract, bilateral: Secondary | ICD-10-CM | POA: Diagnosis not present

## 2024-02-14 DIAGNOSIS — H25043 Posterior subcapsular polar age-related cataract, bilateral: Secondary | ICD-10-CM | POA: Diagnosis not present

## 2024-02-14 DIAGNOSIS — H18413 Arcus senilis, bilateral: Secondary | ICD-10-CM | POA: Diagnosis not present

## 2024-02-14 DIAGNOSIS — H2512 Age-related nuclear cataract, left eye: Secondary | ICD-10-CM | POA: Diagnosis not present

## 2024-02-14 DIAGNOSIS — H2513 Age-related nuclear cataract, bilateral: Secondary | ICD-10-CM | POA: Diagnosis not present

## 2024-02-15 ENCOUNTER — Ambulatory Visit
Admission: RE | Admit: 2024-02-15 | Discharge: 2024-02-15 | Disposition: A | Discharge status: Home / Self Care | Source: Ambulatory Visit | Attending: Orthopedic Surgery | Admitting: Orthopedic Surgery

## 2024-02-15 DIAGNOSIS — M5010 Cervical disc disorder with radiculopathy, unspecified cervical region: Secondary | ICD-10-CM | POA: Diagnosis not present

## 2024-02-15 DIAGNOSIS — M4722 Other spondylosis with radiculopathy, cervical region: Secondary | ICD-10-CM | POA: Diagnosis not present

## 2024-02-15 DIAGNOSIS — M47812 Spondylosis without myelopathy or radiculopathy, cervical region: Secondary | ICD-10-CM

## 2024-02-19 ENCOUNTER — Other Ambulatory Visit (HOSPITAL_COMMUNITY): Payer: Self-pay

## 2024-02-19 ENCOUNTER — Telehealth: Payer: Self-pay

## 2024-02-19 ENCOUNTER — Other Ambulatory Visit: Payer: Self-pay | Admitting: Internal Medicine

## 2024-02-19 MED ORDER — MIRTAZAPINE 15 MG PO TABS
15.0000 mg | ORAL_TABLET | Freq: Every evening | ORAL | 5 refills | Status: DC | PRN
  Filled 2024-02-19: qty 30, 30d supply, fill #0

## 2024-02-19 NOTE — Progress Notes (Signed)
 Ok to let pt know  Due to the nurse concerns, I ask the patient to change from elavil  75 mg to another type of sleep medicine that is easy to take and does not have the similar possible side effect - called mirtazipine  - done to [facility] pharmacy

## 2024-02-19 NOTE — Telephone Encounter (Unsigned)
 Copied from CRM [id number]. Topic: Clinical - Medication Question >> Feb 19, 2024 11:41 AM Herma G wrote: Reason for CRM: Staff from [employer] called to request a call back from Dr. Nicola team for pt at [phone] to discuss safer alternatives to amitriptyline  (ELAVIL ) 75 MG tablet as she states that she discussed with the pt that this med take along withe other ones that she's taking increases the risk or some side effects. Info will also be faxed over to clinic.

## 2024-02-19 NOTE — Telephone Encounter (Signed)
 Ok to let pt know  Due to the nurse concerns, I ask the patient to change from elavil  75 mg to another type of sleep medicine that is easy to take and does not have the similar possible side effect - called mirtazipine  - done to [facility] pharmacy

## 2024-02-20 ENCOUNTER — Other Ambulatory Visit: Payer: Self-pay | Admitting: Obstetrics and Gynecology

## 2024-02-20 DIAGNOSIS — Z1231 Encounter for screening mammogram for malignant neoplasm of breast: Secondary | ICD-10-CM

## 2024-02-24 NOTE — Progress Notes (Signed)
 " Cardiology Office Note:  .   Date:  02/26/2024  ID:  Rock MARLA Shams, DOB Oct 20, 1955, MRN [id number] PCP: Norleen Lynwood ORN, MD  [facility] Providers Cardiologist:  Alm Clay, MD     Chief Complaint  Patient presents with   Follow-up    To discuss test results.  Major issue was exertional dyspnea.    Patient Profile: .     FEDERICA ALLPORT is a obese 68 y.o. female  with a PMH noted below who presents here for 22-month follow-up to discuss test results.  She was referred at the request of Norleen Lynwood ORN, MD, and seen by Aline Door, PA.SABRA   Referring Provider: Norleen Lynwood ORN, MD.  Pertinent PMH: Mild nonobstructive CAD on coronary CTA in 07/2021, tachypalpitations with short runs of PAT and rare PACs/PVCs noted on monitor in 07/2021, varicose veins of bilateral lower extremities with chronic edema s/p ablation, hyperlipidemia, hypothyroidism, and mild asthma     NORIE LATENDRESSE was last seen on November 21, 2022 as a 39-month follow-up evaluation for exertional dyspnea (shortness of breath walking up 3 steps..  No resting dyspnea.  No chest pain.  Symptoms tend to improve with inhaler.  Also noted worsening lower extremity edema.  Noted that support stockings did not work quite great .  When seen by Callie Goodrich she was already on low-dose Toprol  converting from as needed Lopressor  but also ordered a 2D echo and BNP. = Decided to assess her exertional dyspnea with a CPX> and increased Toprol  to 25 mg to treat palpitations. Unfortunately, CPX was not helpful, noting equivocal results due to inability to get adequate exercise.  She did not feel that she was able to actually do the exercise.  We therefore changed to Lexiscan  Myoview .  Subjective  Discussed the use of AI scribe software for clinical note transcription with the patient, who gave verbal consent to proceed.  History of Present Illness LOUISE RAWSON is a 68 year old female with coronary artery disease who presents for  follow-up after a stress test.  She underwent a chemical stress test due to inability to complete the treadmill portion. She takes metoprolol  succinate at night, which has helped with her symptoms.  She experiences sporadic neck pain that sometimes radiates to the back of her head and arm, occurring even when sitting still. She is scheduled to see an orthopedic doctor who has ordered an MRI of her head and neck.  She feels short of breath when walking across a parking lot but not when lying down at night. She sleeps on two pillows, removing one halfway through the night. She experiences swelling in her legs, which is partially relieved by taking a diuretic in the morning and occasionally in the evening. Elevating her feet provides some relief, but not significantly.  No chest pain, pressure, or tightness during activities. She occasionally experiences short spells of heart palpitations. She sometimes feels 'extra tired' and lacks energy suddenly. No episodes of passing out, dizziness, or lightheadedness.  Her blood pressure fluctuates, sometimes being low, and she monitors it at home. She drinks about seven cups of water daily and tries to drink a glass of water upon waking. She does not take aspirin  and has an upcoming eye surgery planned.     Objective   Current Meds  Medication Sig   albuterol  (VENTOLIN  HFA) 108 (90 Base) MCG/ACT inhaler Inhale 2 puffs into the lungs every 4 (four) hours as needed for wheezing or shortness  of breath.   calcium  carbonate (TUMS - DOSED IN MG ELEMENTAL CALCIUM ) 500 MG chewable tablet Chew 1 tablet by mouth as needed for indigestion or heartburn.   Cholecalciferol  50 MCG (2000 UT) TABS 1 tab by mouth once daily   clobetasol  cream (TEMOVATE ) 0.05 % Apply 1 Application topically 2 (two) times daily.   docusate sodium (COLACE) 100 MG capsule Take 100 mg by mouth 2 (two) times daily as needed for mild constipation.   EPINEPHrine  0.3 mg/0.3 mL IJ SOAJ injection  Inject into the muscle.   estradiol  (ESTRACE ) 0.1 MG/GM vaginal cream Insert blueberry-size amount of cream into vagina before bed twice a week   famotidine (PEPCID) 20 MG tablet Take 20 mg by mouth 2 (two) times daily. (Patient taking differently: Take 20 mg by mouth at bedtime.)   furosemide  (LASIX ) 40 MG tablet Take 1 tablet (40 mg total) by mouth every evening. MAY TAKE EXTRA FOR WEIGHT GAIN >3 lbs OVERNIGHT OR >5 lbs WEEKLY   hydrOXYzine  (ATARAX ) 10 MG tablet Take 1 tablet (10 mg total) by mouth 3 (three) times daily as needed.   ibuprofen  (ADVIL ) 200 MG tablet Take 200 mg by mouth every 6 (six) hours as needed.   levothyroxine  (SYNTHROID ) 100 MCG tablet Take 1 tablet (100 mcg total) by mouth every other day.   levothyroxine  (SYNTHROID ) 112 MCG tablet Take 1 tablet (112 mcg total) by mouth daily. (Patient taking differently: Take 112 mcg by mouth every other day.)   loratadine  (CLARITIN ) 10 MG tablet Take 10-20 mg by mouth 2 (two) times daily as needed for allergies.   methocarbamol  (ROBAXIN ) 500 MG tablet Take 500 mg by mouth every 6 (six) hours as needed for muscle spasms.   metoprolol  succinate (TOPROL  XL) 25 MG 24 hr tablet Take 1 tablet (25 mg total) by mouth every evening.   pantoprazole  (PROTONIX ) 40 MG tablet TAKE 1 TABLET BY MOUTH EVERY DAY   phentermine  30 MG capsule Take 1 capsule (30 mg total) by mouth every morning.   pimecrolimus  (ELIDEL ) 1 % cream Apply topically 2 (two) times daily.   rosuvastatin  (CRESTOR ) 40 MG tablet TAKE 1 TABLET BY MOUTH EVERY DAY   triamcinolone  ointment (KENALOG ) 0.1 % Apply 1 Application topically to affected areas 2 (two) times daily as needed for flares.   Vaginal Lubricant (REPLENS VA) Place vaginally.   WIXELA INHUB  250-50 MCG/ACT AEPB INHALE 1 PUFF INTO THE LUNGS IN THE MORNING AND AT BEDTIME.   [DISCONTINUED] estradiol  (ESTRACE ) 0.1 MG/GM vaginal cream INSERT BLUEBERRY SIZE AMOUNT OF CREAM INTO VAGINA BEFORE BED TWICE/WEEK   [DISCONTINUED]  triamcinolone  ointment (KENALOG ) 0.1 % Apply topically to the affected area 2 (two) times daily as needed.     Studies Reviewed: SABRA   EKG Interpretation Date/Time:  Monday February 25 2024 08:05:11 EST Ventricular Rate:  86 PR Interval:  134 QRS Duration:  88 QT Interval:  384 QTC Calculation: 459 R Axis:   -3  Text Interpretation: Normal sinus rhythm Low voltage QRS When compared with ECG of 21-Nov-2023 10:28, No significant change was found Confirmed by Anner Lenis ([id number]) on 02/25/2024 8:14:57 AM    Lab Results  Component Value Date   CHOL 116 09/24/2023   HDL 61.90 09/24/2023   LDLCALC 38 09/24/2023   TRIG 80.0 09/24/2023   CHOLHDL 2 09/24/2023   Lab Results  Component Value Date   NA 140 09/24/2023   CL 104 09/24/2023   K 3.8 09/24/2023   CO2 28 09/24/2023   BUN  15 09/24/2023   CREATININE 0.80 09/24/2023   GFR 77.05 09/24/2023   CALCIUM  9.4 09/24/2023   ALBUMIN  4.2 09/24/2023   GLUCOSE 83 09/24/2023   Lab Results  Component Value Date   HGBA1C 5.8 09/24/2023   Lab Results  Component Value Date   WBC 4.5 09/24/2023   HGB 13.6 09/24/2023   HCT 41.1 09/24/2023   MCV 92.5 09/24/2023   PLT 216.0 09/24/2023    Results LABS A1c: 5.8 (09/2023)  DIAGNOSTIC Lexiscan  Myoview :  LOW RISK.  EF 71%.  Hyperdynamic.  No ischemia or infarction.  Mild coronary calcification noted.  No change compared to 2017.  (01/01/2024) CPX: Test stopped early due to legs giving out.  Also concerned with body habitus.  Markedly submaximal aerobic effort limiting interpretation.  Consider cycle ergometer-patient was not able to pedal.  (12/04/2023)  Prior Studies: ECHO: EF 70 to 75%.  Hyperdynamic with G1 DD.  Normal RV size and function.  Normal RVP and RAP.  Normal valves.  (09/26/2023 Zio patch MONITOR: Predominant sinus rhythm with a rate range 67-1 33 and average 91 bpm.  Rare PACs and PVCs.  5 brief atrial runs.  No sustained arrhythmias.  (07/2021) Coronary CTA: CAC 59.  Mild  nonobstructive CAD (25 to 49%).  (07/2021)  Risk Assessment/Calculations:           Physical Exam:   VS:  BP (!) 88/56 (BP Location: Right Arm, Patient Position: Sitting, Cuff Size: Normal)   Pulse 86   Ht 5' 2 (1.575 m)   Wt 194 lb 3.2 oz (88.1 kg)   SpO2 95%   BMI 35.52 kg/m    Wt Readings from Last 3 Encounters:  02/25/24 194 lb 3.2 oz (88.1 kg)  01/29/24 190 lb (86.2 kg)  01/29/24 190 lb 3.2 oz (86.3 kg)     GEN: Well nourished, well groomed in no acute distress; mild to moderately obese otherwise healthy appearing.,  NECK: No JVD; No carotid bruits CARDIAC: Normal S1, S2; RRR, no murmurs, rubs, gallops RESPIRATORY:  Clear to auscultation without rales, wheezing or rhonchi ; nonlabored, good air movement. ABDOMEN: Soft, non-tender, non-distended EXTREMITIES: Trace edema; No deformity      ASSESSMENT AND PLAN: .    Problem List Items Addressed This Visit       Cardiology Problems   Hyperlipidemia LDL goal <70 (Chronic)   LDL was 38 on last check and this was on 40 mg rosuvastatin  - Continue rosuvastatin  at current dose.      Relevant Orders   EKG 12-Lead (Completed)   Varicose veins of bilateral lower extremities with other complications (Chronic)   Relevant Orders   EKG 12-Lead (Completed)     Other   Bilateral lower extremity edema (Chronic)   Persistent edema partially responsive to diuretics. No cardiac cause identified. Adequate fluid intake important. - Continue diuretics as needed, primarily in the morning.  On standing dose of furosemide  40 mg every morning with additional doses as needed. - Elevate feet when possible. - Recommend support stockings      Relevant Orders   EKG 12-Lead (Completed)   Coronary Calcium  Score 59 with mild nonobstructive CAD - Primary (Chronic)   Minimal CAD noted on coronary CTA 2023 now with a negative/nonischemic Myoview  with hyperdynamic LV function.   Would suggest that her exertional dyspnea and atypical chest  discomfort symptoms are not related coronary ischemia.  Continue to treat risk factors of blood pressure and lipids and closer control as well as weight loss.  Relevant Orders   EKG 12-Lead (Completed)   Rapid palpitations (Chronic)   Palpitations improved with increased metoprolol  succinate. Stress test showed no myocardial infarction or new coronary blockages. Cardiac function hyperdynamic but managed. Dehydration may worsen palpitations. - Continue metoprolol  succinate 25 mg every day at bedtime as prescribed. - Ensure adequate hydration.      Relevant Orders   EKG 12-Lead (Completed)         Follow-Up: Return in about 1 year (around 02/24/2025) for [employer].     Signed, Alm MICAEL Clay, MD, MS Alm Clay, M.D., M.S. Interventional Cardiologist  [facility] Pager # [phone]      "

## 2024-02-25 ENCOUNTER — Encounter: Payer: Self-pay | Admitting: Cardiology

## 2024-02-25 ENCOUNTER — Encounter: Payer: Self-pay | Admitting: Radiology

## 2024-02-25 ENCOUNTER — Ambulatory Visit: Attending: Cardiology | Admitting: Cardiology

## 2024-02-25 VITALS — BP 88/56 | HR 86 | Ht 62.0 in | Wt 194.2 lb

## 2024-02-25 DIAGNOSIS — I83893 Varicose veins of bilateral lower extremities with other complications: Secondary | ICD-10-CM | POA: Diagnosis not present

## 2024-02-25 DIAGNOSIS — R600 Localized edema: Secondary | ICD-10-CM

## 2024-02-25 DIAGNOSIS — R002 Palpitations: Secondary | ICD-10-CM

## 2024-02-25 DIAGNOSIS — E785 Hyperlipidemia, unspecified: Secondary | ICD-10-CM

## 2024-02-25 DIAGNOSIS — R931 Abnormal findings on diagnostic imaging of heart and coronary circulation: Secondary | ICD-10-CM | POA: Diagnosis not present

## 2024-02-25 NOTE — Patient Instructions (Addendum)
 Medication Instructions:   No changes  *If you need a refill on your cardiac medications before your next appointment, please call your pharmacy*   Lab Work: Not needed    Testing/Procedures:  Not needed  Follow-Up: At [facility], you and your health needs are our priority.  As part of our continuing mission to provide you with exceptional heart care, we have created designated Provider Care Teams.  These Care Teams include your primary Cardiologist (physician) and Advanced Practice Providers (APPs -  Physician Assistants and Nurse Practitioners) who all work together to provide you with the care you need, when you need it.     Your next appointment:   12 month(s)  The format for your next appointment:   In Person  Provider:   Alm Clay, MD   Other Instructions    Hydrate - drink at least 8-10  (8oz)  water a day

## 2024-02-26 ENCOUNTER — Encounter: Payer: Self-pay | Admitting: Cardiology

## 2024-02-26 NOTE — Assessment & Plan Note (Signed)
 Persistent edema partially responsive to diuretics. No cardiac cause identified. Adequate fluid intake important. - Continue diuretics as needed, primarily in the morning.  On standing dose of furosemide  40 mg every morning with additional doses as needed. - Elevate feet when possible. - Recommend support stockings

## 2024-02-26 NOTE — Assessment & Plan Note (Signed)
 Minimal CAD noted on coronary CTA 2023 now with a negative/nonischemic Myoview  with hyperdynamic LV function.   Would suggest that her exertional dyspnea and atypical chest discomfort symptoms are not related coronary ischemia.  Continue to treat risk factors of blood pressure and lipids and closer control as well as weight loss.

## 2024-02-26 NOTE — Assessment & Plan Note (Signed)
 LDL was 38 on last check and this was on 40 mg rosuvastatin  - Continue rosuvastatin  at current dose.

## 2024-02-26 NOTE — Assessment & Plan Note (Signed)
 Palpitations improved with increased metoprolol  succinate. Stress test showed no myocardial infarction or new coronary blockages. Cardiac function hyperdynamic but managed. Dehydration may worsen palpitations. - Continue metoprolol  succinate 25 mg every day at bedtime as prescribed. - Ensure adequate hydration.

## 2024-02-27 ENCOUNTER — Encounter: Payer: Self-pay | Admitting: Orthopedic Surgery

## 2024-02-27 ENCOUNTER — Ambulatory Visit: Admitting: Orthopedic Surgery

## 2024-02-27 DIAGNOSIS — M47812 Spondylosis without myelopathy or radiculopathy, cervical region: Secondary | ICD-10-CM | POA: Diagnosis not present
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
## 2024-02-29 ENCOUNTER — Other Ambulatory Visit: Payer: Self-pay | Admitting: Internal Medicine

## 2024-02-29 ENCOUNTER — Other Ambulatory Visit: Payer: Self-pay

## 2024-02-29 ENCOUNTER — Other Ambulatory Visit (HOSPITAL_COMMUNITY): Payer: Self-pay

## 2024-02-29 MED ORDER — LEVOTHYROXINE SODIUM 112 MCG PO TABS
112.0000 ug | ORAL_TABLET | Freq: Every day | ORAL | 3 refills | Status: DC
  Filled 2024-02-29: qty 90, 90d supply, fill #0

## 2024-03-03 ENCOUNTER — Other Ambulatory Visit (HOSPITAL_COMMUNITY): Payer: Self-pay

## 2024-03-08 ENCOUNTER — Ambulatory Visit (HOSPITAL_BASED_OUTPATIENT_CLINIC_OR_DEPARTMENT_OTHER): Admitting: Physical Therapy

## 2024-03-13 ENCOUNTER — Encounter (HOSPITAL_BASED_OUTPATIENT_CLINIC_OR_DEPARTMENT_OTHER): Admitting: Physical Therapy

## 2024-03-25 ENCOUNTER — Ambulatory Visit: Admitting: Allergy & Immunology

## 2024-03-26 ENCOUNTER — Ambulatory Visit: Admitting: Internal Medicine

## 2024-03-27 ENCOUNTER — Other Ambulatory Visit (HOSPITAL_COMMUNITY): Payer: Self-pay

## 2024-03-27 ENCOUNTER — Encounter (HOSPITAL_BASED_OUTPATIENT_CLINIC_OR_DEPARTMENT_OTHER): Admitting: Physical Therapy

## 2024-03-27 MED ORDER — ESTRADIOL 0.01 % VA CREA
TOPICAL_CREAM | VAGINAL | 5 refills | Status: AC
  Filled 2024-03-27: qty 42.5, 90d supply, fill #0
  Filled 2024-05-21 – 2024-05-27 (×2): qty 42.5, 90d supply, fill #1

## 2024-03-28 ENCOUNTER — Encounter: Payer: Self-pay | Admitting: Gastroenterology

## 2024-03-28 ENCOUNTER — Ambulatory Visit: Admitting: Gastroenterology

## 2024-03-28 VITALS — BP 102/60 | HR 85 | Ht 62.0 in | Wt 189.8 lb

## 2024-03-28 DIAGNOSIS — K5909 Other constipation: Secondary | ICD-10-CM

## 2024-03-28 DIAGNOSIS — K648 Other hemorrhoids: Secondary | ICD-10-CM | POA: Diagnosis not present
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
## 2024-03-28 NOTE — Patient Instructions (Addendum)
 Please purchase the following medications over the counter and take as directed: Desitin cream   Thank you for trusting me with your gastrointestinal care!    Dr. Victory Legrand DOUGLAS [facility]

## 2024-04-02 ENCOUNTER — Ambulatory Visit: Admitting: Internal Medicine

## 2024-04-02 ENCOUNTER — Other Ambulatory Visit (HOSPITAL_COMMUNITY): Payer: Self-pay

## 2024-04-02 ENCOUNTER — Other Ambulatory Visit: Payer: Self-pay

## 2024-04-02 ENCOUNTER — Ambulatory Visit: Admitting: Physical Medicine and Rehabilitation

## 2024-04-02 ENCOUNTER — Encounter: Payer: Self-pay | Admitting: Internal Medicine

## 2024-04-02 VITALS — BP 124/80 | HR 76

## 2024-04-02 VITALS — BP 122/80 | HR 82 | Temp 98.5°F | Ht 62.0 in | Wt 188.0 lb

## 2024-04-02 DIAGNOSIS — E785 Hyperlipidemia, unspecified: Secondary | ICD-10-CM

## 2024-04-02 DIAGNOSIS — E039 Hypothyroidism, unspecified: Secondary | ICD-10-CM

## 2024-04-02 DIAGNOSIS — R7302 Impaired glucose tolerance (oral): Secondary | ICD-10-CM

## 2024-04-02 DIAGNOSIS — M47812 Spondylosis without myelopathy or radiculopathy, cervical region: Secondary | ICD-10-CM

## 2024-04-02 DIAGNOSIS — E559 Vitamin D deficiency, unspecified: Secondary | ICD-10-CM

## 2024-04-02 DIAGNOSIS — G4700 Insomnia, unspecified: Secondary | ICD-10-CM

## 2024-04-02 DIAGNOSIS — Z6835 Body mass index (BMI) 35.0-35.9, adult: Secondary | ICD-10-CM

## 2024-04-02 LAB — CBC WITH DIFFERENTIAL/PLATELET
Basophils Absolute: 0.1 K/uL (ref 0.0–0.1)
Basophils Relative: 1.1 % (ref 0.0–3.0)
Eosinophils Absolute: 0.2 K/uL (ref 0.0–0.7)
Eosinophils Relative: 4.1 % (ref 0.0–5.0)
HCT: 38.6 % (ref 36.0–46.0)
Hemoglobin: 13.2 g/dL (ref 12.0–15.0)
Lymphocytes Relative: 26.3 % (ref 12.0–46.0)
Lymphs Abs: 1.4 K/uL (ref 0.7–4.0)
MCHC: 34.3 g/dL (ref 30.0–36.0)
MCV: 92.5 fl (ref 78.0–100.0)
Monocytes Absolute: 0.5 K/uL (ref 0.1–1.0)
Monocytes Relative: 9.1 % (ref 3.0–12.0)
Neutro Abs: 3.1 K/uL (ref 1.4–7.7)
Neutrophils Relative %: 59.4 % (ref 43.0–77.0)
Platelets: 212 K/uL (ref 150.0–400.0)
RBC: 4.17 Mil/uL (ref 3.87–5.11)
RDW: 13.7 % (ref 11.5–15.5)
WBC: 5.2 K/uL (ref 4.0–10.5)

## 2024-04-02 LAB — BASIC METABOLIC PANEL WITH GFR
BUN: 10 mg/dL (ref 6–23)
CO2: 27 meq/L (ref 19–32)
Calcium: 9.4 mg/dL (ref 8.4–10.5)
Chloride: 104 meq/L (ref 96–112)
Creatinine, Ser: 0.77 mg/dL (ref 0.40–1.20)
GFR: 80.37 mL/min (ref >60.00)
Glucose, Bld: 89 mg/dL (ref 70–99)
Potassium: 3.9 meq/L (ref 3.5–5.1)
Sodium: 137 meq/L (ref 135–145)

## 2024-04-02 LAB — URINALYSIS, ROUTINE W REFLEX MICROSCOPIC
Bilirubin Urine: NEGATIVE
Hgb urine dipstick: NEGATIVE
Ketones, ur: NEGATIVE
Leukocytes,Ua: NEGATIVE
Nitrite: NEGATIVE
RBC / HPF: NONE SEEN (ref 0–?)
Specific Gravity, Urine: <=1.005 — AB (ref 1.000–1.030)
Total Protein, Urine: NEGATIVE
Urine Glucose: NEGATIVE
Urobilinogen, UA: 0.2 (ref 0.0–1.0)
pH: 6.5 (ref 5.0–8.0)

## 2024-04-02 LAB — HEPATIC FUNCTION PANEL
ALT: 17 U/L (ref 0–35)
AST: 21 U/L (ref 0–37)
Albumin: 4.1 g/dL (ref 3.5–5.2)
Alkaline Phosphatase: 69 U/L (ref 39–117)
Bilirubin, Direct: 0.1 mg/dL (ref 0.0–0.3)
Total Bilirubin: 0.4 mg/dL (ref 0.2–1.2)
Total Protein: 6.7 g/dL (ref 6.0–8.3)

## 2024-04-02 LAB — TSH: TSH: 9.38 u[IU]/mL — ABNORMAL HIGH (ref 0.35–5.50)

## 2024-04-02 LAB — LIPID PANEL
Cholesterol: 118 mg/dL (ref 0–200)
HDL: 59.8 mg/dL (ref >39.00)
LDL Cholesterol: 42 mg/dL (ref 0–99)
NonHDL: 58.2
Total CHOL/HDL Ratio: 2
Triglycerides: 83 mg/dL (ref 0.0–149.0)
VLDL: 16.6 mg/dL (ref 0.0–40.0)

## 2024-04-02 LAB — VITAMIN D 25 HYDROXY (VIT D DEFICIENCY, FRACTURES): VITD: 47.35 ng/mL (ref 30.00–100.00)

## 2024-04-02 LAB — HEMOGLOBIN A1C: Hgb A1c MFr Bld: 5.7 % (ref 4.6–6.5)

## 2024-04-02 MED ORDER — METHYLPREDNISOLONE ACETATE 40 MG/ML IJ SUSP
40.0000 mg | Freq: Once | INTRAMUSCULAR | Status: AC
  Administered 2024-04-02: 40 mg

## 2024-04-02 MED ORDER — PHENTERMINE HCL 30 MG PO CAPS
30.0000 mg | ORAL_CAPSULE | ORAL | 1 refills | Status: AC
  Filled 2024-04-02: qty 90, 90d supply, fill #0

## 2024-04-02 NOTE — Patient Instructions (Signed)
 Please continue all other medications as before, and refills have been done for the phentermine   Please have the pharmacy call with any other refills you may need.  Please continue your efforts at being more active, low cholesterol diet, and weight control.  Please keep your appointments with your specialists as you may have planned  Please go to the LAB at the blood drawing area for the tests to be done  You will be contacted by phone if any changes need to be made immediately.  Otherwise, you will receive a letter about your results with an explanation, but please check with MyChart first.  Please make an Appointment to return in 6 months, or sooner if needed
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
## 2024-04-02 NOTE — Progress Notes (Unsigned)
 Pain Scale   Average Pain 4 Patient advising she has chronic neck pain radiating to bilateral shoulders pain is constant when moving her head        +Driver, -BT, -Dye Allergies.

## 2024-04-03 ENCOUNTER — Other Ambulatory Visit (HOSPITAL_COMMUNITY): Payer: Self-pay

## 2024-04-03 ENCOUNTER — Ambulatory Visit: Payer: Self-pay | Admitting: Internal Medicine

## 2024-04-03 ENCOUNTER — Encounter (HOSPITAL_BASED_OUTPATIENT_CLINIC_OR_DEPARTMENT_OTHER): Admitting: Physical Therapy

## 2024-04-03 ENCOUNTER — Other Ambulatory Visit: Payer: Self-pay | Admitting: Internal Medicine

## 2024-04-03 LAB — THYROID PANEL WITH TSH
Free Thyroxine Index: 2.7 (ref 1.4–3.8)
T3 Uptake: 29 % (ref 22–35)
T4, Total: 9.3 ug/dL (ref 5.1–11.9)
TSH: 9.75 m[IU]/L — ABNORMAL HIGH (ref 0.40–4.50)

## 2024-04-03 MED ORDER — LEVOTHYROXINE SODIUM 112 MCG PO TABS
112.0000 ug | ORAL_TABLET | Freq: Every day | ORAL | 3 refills | Status: AC
  Filled 2024-04-03: qty 90, 90d supply, fill #0

## 2024-04-04 ENCOUNTER — Other Ambulatory Visit (HOSPITAL_COMMUNITY): Payer: Self-pay

## 2024-04-04 ENCOUNTER — Encounter: Payer: Self-pay | Admitting: Internal Medicine

## 2024-04-04 NOTE — Assessment & Plan Note (Signed)
 Lab Results  Component Value Date   LDLCALC 42 04/02/2024   Stable, pt to continue current statin crestor  40 mg qd

## 2024-04-04 NOTE — Assessment & Plan Note (Signed)
 Improved and stable, continue elavil  75 qhs

## 2024-04-04 NOTE — Assessment & Plan Note (Signed)
 Improving - pt to continue phentermine  30 mg limited rx

## 2024-04-04 NOTE — Assessment & Plan Note (Signed)
 Lab Results  Component Value Date   TSH 9.75 (H) 04/02/2024   Uncontrolled, pt to increase levothyroxine  to 112 mcg per day (increased from levothyroxin 100 every other day, and 112 mcg every other day)

## 2024-04-04 NOTE — Assessment & Plan Note (Signed)
 Last vitamin D  Lab Results  Component Value Date   VD25OH 47.35 04/02/2024   Stable, cont oral replacement

## 2024-04-07 ENCOUNTER — Telehealth: Payer: Self-pay | Admitting: Orthopedic Surgery

## 2024-04-07 NOTE — Telephone Encounter (Signed)
 Pt called wanting to know if she is supposed to schedule physical therapy or not. Call back number is [phone]

## 2024-04-07 NOTE — Telephone Encounter (Signed)
 5 days out from neck injection.  I think would be okay to start her in some physical therapy in a week or 2 depending on how she is feeling.  I think it is probably a good idea also in case the injection does not work and she needs surgical evaluation.  Plan would be therapy 1 time a week for 6 weeks starting either the week of Christmas or afterwards.  Here is okay.  Thanks

## 2024-04-08 ENCOUNTER — Encounter: Payer: Self-pay | Admitting: Allergy & Immunology

## 2024-04-08 ENCOUNTER — Other Ambulatory Visit (HOSPITAL_COMMUNITY): Payer: Self-pay

## 2024-04-08 ENCOUNTER — Other Ambulatory Visit: Payer: Self-pay

## 2024-04-08 ENCOUNTER — Ambulatory Visit: Admitting: Allergy & Immunology

## 2024-04-08 VITALS — BP 118/74 | HR 99 | Temp 98.1°F | Resp 18 | Ht 62.0 in | Wt 188.2 lb

## 2024-04-08 DIAGNOSIS — J4540 Moderate persistent asthma, uncomplicated: Secondary | ICD-10-CM | POA: Diagnosis not present

## 2024-04-08 DIAGNOSIS — L299 Pruritus, unspecified: Secondary | ICD-10-CM | POA: Diagnosis not present

## 2024-04-08 DIAGNOSIS — L508 Other urticaria: Secondary | ICD-10-CM | POA: Diagnosis not present

## 2024-04-08 DIAGNOSIS — L281 Prurigo nodularis: Secondary | ICD-10-CM | POA: Diagnosis not present

## 2024-04-08 DIAGNOSIS — M47812 Spondylosis without myelopathy or radiculopathy, cervical region: Secondary | ICD-10-CM

## 2024-04-08 DIAGNOSIS — M25519 Pain in unspecified shoulder: Secondary | ICD-10-CM

## 2024-04-08 MED ORDER — EPINEPHRINE 0.3 MG/0.3ML IJ SOAJ
0.3000 mg | Freq: Once | INTRAMUSCULAR | 2 refills | Status: AC
  Filled 2024-04-08: qty 2, 4d supply, fill #0
  Filled 2024-04-10: qty 2, 30d supply, fill #0

## 2024-04-08 MED ORDER — ALBUTEROL SULFATE HFA 108 (90 BASE) MCG/ACT IN AERS
2.0000 | INHALATION_SPRAY | RESPIRATORY_TRACT | 1 refills | Status: AC | PRN
  Filled 2024-04-08: qty 6.7, 17d supply, fill #0

## 2024-04-08 MED ORDER — FLUTICASONE-SALMETEROL 250-50 MCG/ACT IN AEPB
1.0000 | INHALATION_SPRAY | Freq: Two times a day (BID) | RESPIRATORY_TRACT | 1 refills | Status: AC
  Filled 2024-04-08: qty 180, 90d supply, fill #0

## 2024-04-08 NOTE — Patient Instructions (Addendum)
 1. Moderate intermittent asthma, uncomplicated - Lung testing looks excellent today.  - We are not going to make any changes at this point in time.  - Daily controller medication(s): Wixela 250/50mg  one puff TWICE daily - Rescue medications: albuterol  4 puffs every 4-6 hours as needed WITH COUGHING/WHEEZING - Asthma control goals:  * Full participation in all desired activities (may need albuterol  before activity) * Albuterol  use two time or less a week on average (not counting use with activity) * Cough interfering with sleep two time or less a month * Oral steroids no more than once a year * No hospitalizations  2. Pruritus with urticaria and overlying prurigo nodularis - Stopped the Xolair  in Feb 2025 due to insurance coverage.  - We can look into getting something else covered, but you seem to be doing well without the Xolair  on board.  - Continue to follow with Dermatology (I will send my note to Dr. Elnor).  - Information provided on Dupixent which is approved for treatment of prurigo nodularis.  - Scan your phone and check out this information on PN:   - Continue with triamcinolone  twice daily as needed.  - Continue with the pimecrolimus  twice daily as needed (SAFE TO USE HEAD TO TOE). - You can continue with the antihistamines:   AM AS NEEDED: Pepcid (famotidine) + hydroxyzine  10mg    NOON AS NEEDED: Claritin  (loratadine ) 1-2 times daily  PM AS NEEDED: Pepcid (famotidine) + hydroxyzine  10mg    3. Return in about 6 months (around 10/07/2024). You can have the follow up appointment with Dr. Iva or a Nurse Practicioner (our Nurse Practitioners are excellent and always have Physician oversight!).    Please inform us  of any Emergency Department visits, hospitalizations, or changes in symptoms. Call us  before going to the ED for breathing or allergy symptoms since we might be able to fit you in for a sick visit. Feel free to contact us  anytime with any questions, problems, or  concerns.  It was a pleasure to see you again today!  Websites that have reliable patient information: 1. American Academy of Asthma, Allergy, and Immunology: www.aaaai.org 2. Food Allergy Research and Education (FARE): foodallergy.org 3. Mothers of Asthmatics: http://www.asthmacommunitynetwork.org 4. American College of Allergy, Asthma, and Immunology: www.acaai.org      Like us  on [employer] and Instagram for our latest updates!      A healthy democracy works best when [employer] participate! Make sure you are registered to vote! If you have moved or changed any of your contact information, you will need to get this updated before voting! Scan the QR codes below to learn more!

## 2024-04-08 NOTE — Progress Notes (Unsigned)
 FOLLOW UP  Date of Service/Encounter:  04/08/2024   Assessment:   Pruritis - with sensitization to dust mite    Chronic urticaria - doing fairly well despite not being on Xolair  since February 2025   Mild eosinophilia (AEC 500)   Moderate persistent asthma, uncomplicated  Plan/Recommendations:   1. Moderate intermittent asthma, uncomplicated - Lung testing looks excellent today.  - We are not going to make any changes at this point in time.  - Daily controller medication(s): Wixela 250/50mg  one puff TWICE daily - Rescue medications: albuterol  4 puffs every 4-6 hours as needed WITH COUGHING/WHEEZING - Asthma control goals:  * Full participation in all desired activities (may need albuterol  before activity) * Albuterol  use two time or less a week on average (not counting use with activity) * Cough interfering with sleep two time or less a month * Oral steroids no more than once a year * No hospitalizations  2. Pruritus with urticaria and overlying prurigo nodularis - Stopped the Xolair  in Feb 2025 due to insurance coverage.  - We can look into getting something else covered, but you seem to be doing well without the Xolair  on board.  - Continue to follow with Dermatology (I will send my note to Dr. Elnor).  - Information provided on Dupixent which is approved for treatment of prurigo nodularis.  - Scan your phone and check out this information on PN:  - Continue with triamcinolone  twice daily as needed.  - Continue with the pimecrolimus  twice daily as needed (SAFE TO USE HEAD TO TOE). - You can continue with the antihistamines:   AM AS NEEDED: Pepcid (famotidine) + hydroxyzine  10mg    NOON AS NEEDED: Claritin  (loratadine ) 1-2 times daily  PM AS NEEDED: Pepcid (famotidine) + hydroxyzine  10mg    3. Return in about 6 months (around 10/07/2024). You can have the follow up appointment with Dr. Iva or a Nurse Practicioner (our Nurse Practitioners are excellent and always have  Physician oversight!).    Subjective:   [name] is a 68 y.o. female presenting today for follow up of  Chief Complaint  Patient presents with   Follow-up    No complaints     [name] has a history of the following: Patient Active Problem List   Diagnosis Date Noted   Pruritus 01/29/2024   Neck pain 01/29/2024   Blood in stool 01/29/2024   DOE (dyspnea on exertion) 11/21/2023   Upper respiratory infection 11/06/2023   Urinary bladder stone 06/19/2023   UTI (urinary tract infection) 03/27/2023   Wrist pain, acute, left 11/07/2022   Neck pain, bilateral 11/07/2022   Fall 11/07/2022   Chronic pain of both shoulders 11/07/2022   Cough 10/26/2021   Wheezing 10/26/2021   Coronary Calcium  Score 59 with mild nonobstructive CAD 07/30/2021   Vitamin D  deficiency 06/11/2021   Dysuria 08/31/2020   Chronic pruritus 05/30/2020   Rash 04/03/2020   Chest pain 11/14/2019   Insomnia 11/14/2019   Pelvic prolapse 10/06/2019   Leg pain, bilateral 05/01/2019   Disease of vein 05/01/2019   Acute diverticulitis 12/23/2018   Diverticulitis 12/09/2018   Asthma 10/02/2018   Pain in right hand 07/12/2018   Pain in both feet 11/08/2017   Nodule of chest wall 10/11/2017   Mass of right side of neck 10/11/2017   Varicose veins of bilateral lower extremities with other complications 08/30/2017   Complication of anesthesia    Right groin pain 04/21/2017   Hyperlipidemia LDL goal <70 03/23/2017  Dorsalgia 03/01/2017   Rapid palpitations 03/01/2017   Preop cardiovascular exam 03/01/2017   Tinea corporis 12/10/2016   Right lumbar radiculopathy 08/15/2016   Rib pain on right side 03/03/2016   Nosebleed 03/03/2016   Acute colitis 08/30/2013   GERD (gastroesophageal reflux disease) 08/14/2012   Pain and swelling of lower leg 10/23/2011   Depression 07/10/2011   History of renal stone 04/15/2011   Pleural effusion 04/14/2011   Hypokalemia 04/14/2011   Chronic low back pain  03/29/2011   Obesity 03/29/2011   Encounter for well adult exam with abnormal findings 12/30/2010   THYROID  NODULE, RIGHT 02/21/2010   NECK PAIN, RIGHT 02/21/2010   Bilateral lower extremity edema 02/21/2010   Atony of bladder 11/10/2009   Fatigue 03/11/2009   SINUSITIS, CHRONIC 11/27/2008   Acute cellulitis 11/27/2008   Hypothyroidism 07/19/2007   Anxiety state 07/19/2007   Allergic rhinitis 07/19/2007   DIVERTICULOSIS, COLON 07/19/2007   DEGENERATIVE DISC DISEASE 07/19/2007   Hx of colonic polyps 07/19/2007    History obtained from: chart review and patient.  Discussed the use of AI scribe software for clinical note transcription with the patient and/or guardian, who gave verbal consent to proceed.  [name] is a 68 y.o. female presenting for a follow up visit. She was last seen in June 2025.  At that time, lung testing looked excellent.  We continue with Wixela 250/50 mcg 1 puff twice daily as well as albuterol  as needed.  For her itching, we stopped the Xolair  in February 2025 due to insurance coverage.  However, despite this, she continued to do pretty well.  We continue with triamcinolone  as well as pimecrolimus .  We also continue with antihistamines 2-3 times a day, including H1 and H2 blockade.  In the interim, she has done pretty well despite being off the Xolair .  She has been managing her itching and rash with alternating topical treatments prescribed by her dermatologist and another provider. Recently, she received a steroid injection into the more severe areas, which alleviated symptoms. She describes persistent lesions as 'hot spot areas' that are difficult to eliminate.   Currently, she is not on Xolair  due to insurance coverage issues but notes improvement in her skin's appearance without it. She uses Claritin  and hydroxyzine  daily, and Pepcid intermittently for symptom control.   She does think that she has been diagnosed with prurigo nodularis by her dermatologist, as the  diagnosis does sound familiar to her.  Asthma/Respiratory Symptom History: She remains on Wixela.  This seems to be working to control her breathing.  She is not using albuterol  routinely.  Her shortness of breath has improved.  She has not been on prednisone  or been to the emergency room for symptoms.  She underwent a recent procedure on her back due to spurs and disc issues, which has resulted in her arm feeling 'blocked up'.  This is her biggest issue today.  She has a history of thyroid  issues and was recently taken off one of her thyroid  medications due to elevated levels. She is currently on a 112 mcg dose of her thyroid  medication. She reports experiencing heart-related symptoms, described as spasms in her neck, which she attributes to a hereditary condition.  In November, she experienced unexpected rectal bleeding, not associated with bowel movements, prompting a visit to a gastroenterologist. No recent sinus infections, ear infections, or pneumonia.   Otherwise, there have been no changes to her past medical history, surgical history, family history, or social history.    Review of  systems otherwise negative other than that mentioned in the HPI.    Objective:   Blood pressure 118/74, pulse 99, temperature 98.1 F (36.7 C), temperature source Temporal, resp. rate 18, height 5' 2 (1.575 m), weight 188 lb 3.2 oz (85.4 kg), SpO2 98%. Body mass index is 34.42 kg/m.    Physical Exam Vitals reviewed.  Constitutional:      Appearance: She is well-developed.     Comments: Pleasant.  Cooperative.  HENT:     Head: Normocephalic and atraumatic.     Right Ear: Tympanic membrane, ear canal and external ear normal.     Left Ear: Tympanic membrane, ear canal and external ear normal.     Nose: No nasal deformity, septal deviation, mucosal edema or rhinorrhea.     Right Turbinates: Enlarged, swollen and pale.     Left Turbinates: Enlarged, swollen and pale.     Right Sinus: No  maxillary sinus tenderness or frontal sinus tenderness.     Left Sinus: No maxillary sinus tenderness or frontal sinus tenderness.     Comments: No nasal polyps.    Mouth/Throat:     Mouth: Mucous membranes are not pale and not dry.     Pharynx: Uvula midline.  Eyes:     General: Lids are normal. No allergic shiner.       Right eye: No discharge.        Left eye: No discharge.     Conjunctiva/sclera: Conjunctivae normal.     Right eye: Right conjunctiva is not injected. No chemosis.    Left eye: Left conjunctiva is not injected. No chemosis.    Pupils: Pupils are equal, round, and reactive to light.  Cardiovascular:     Rate and Rhythm: Normal rate and regular rhythm.     Heart sounds: Normal heart sounds.  Pulmonary:     Effort: Pulmonary effort is normal. No tachypnea, accessory muscle usage or respiratory distress.     Breath sounds: Normal breath sounds. No wheezing, rhonchi or rales.     Comments: Moving air well in all lung fields. Chest:     Chest wall: No tenderness.  Lymphadenopathy:     Cervical: No cervical adenopathy.  Skin:    General: Skin is warm.     Capillary Refill: Capillary refill takes less than 2 seconds.     Coloration: Skin is not pale.     Findings: Lesion present. No abrasion, erythema, petechiae or rash. Rash is not papular, urticarial or vesicular.     Comments: Overall, skin looks much better.  She has some lesions on her left arm with some eschar formation.  Lesions are consistent with prurigo nodularis.  Neurological:     Mental Status: She is alert.  Psychiatric:        Behavior: Behavior is cooperative.      Diagnostic studies:   Spirometry: Normal FEV1, FVC, and FEV1/FVC ratio. There is no scooping suggestive of obstructive disease.        Marty Shaggy, MD  [facility] 

## 2024-04-09 ENCOUNTER — Other Ambulatory Visit (HOSPITAL_COMMUNITY): Payer: Self-pay

## 2024-04-09 ENCOUNTER — Other Ambulatory Visit: Payer: Self-pay

## 2024-04-10 ENCOUNTER — Other Ambulatory Visit (HOSPITAL_COMMUNITY): Payer: Self-pay

## 2024-04-11 ENCOUNTER — Other Ambulatory Visit (HOSPITAL_COMMUNITY): Payer: Self-pay

## 2024-04-14 NOTE — Procedures (Signed)
 Cervical Facet Joint Intra-Articular Injection with Fluoroscopic Guidance  Patient: [name]      Date of Birth: [birth year] MRN: [id number] PCP: Norleen Lynwood ORN, MD      Visit Date: 04/02/2024   Universal Protocol:    Date/Time: 12/22/20255:17 AM  Consent Given By: the patient  Position: PRONE  Additional Comments: Vital signs were monitored before and after the procedure. Patient was prepped and draped in the usual sterile fashion. The correct patient, procedure, and site was verified.   Injection Procedure Details:  Procedure Site One Meds Administered:  Meds ordered this encounter  Medications   methylPREDNISolone  acetate (DEPO-MEDROL ) injection 40 mg     Laterality: Left  Location/Site:  C4-5  Needle size: 25 G  Needle type: Spinal  Needle Placement: Articular  Findings:  -Contrast Used: 0.5 mL iohexol  180 mg iodine/mL   -Comments: Excellent flow of contrast producing a partial arthrogram.  Procedure Details: The region overlying the facet joints mentioned above were localized under fluoroscopic visualization. The needle was inserted down to the level of the lateral mass of the superior articular process of the facet joint to be injected. Then, the needle was walked off inferiorly into the lateral aspect of the facet joint. Bi-planar images were used for confirming placement and spot radiographs were documented.  A 0.25 ml volume of Omnipaque -240 was injected into the facet joint and a standard partial arthrogram was obtained. Radiographs were obtained of the arthrogram. A 0.5 ml. volume of the steroid/anesthetic solution was injected into the joint. This procedure was repeated for each facet joint injected.   Additional Comments:  The patient tolerated the procedure well Dressing: Band-Aid    Post-procedure details: Patient was observed during the procedure. Post-procedure instructions were reviewed.  Patient left the clinic in stable condition.

## 2024-04-14 NOTE — Progress Notes (Signed)
 "  [name] - 68 y.o. female MRN [id number]  Date of birth: 10/30/1955  Office Visit Note: Visit Date: 04/02/2024 PCP: Norleen Lynwood ORN, MD Referred by: Norleen Lynwood ORN, MD  Subjective: Chief Complaint  Patient presents with   Neck - Pain   HPI:  [name] is a 68 y.o. female who comes in today at the request of Dr. JUDITHANN Glendia Hutchinson for planned Left  C4-5 Cervical facet/medial branch block with fluoroscopic guidance.  The patient has failed conservative care including home exercise, medications, time and activity modification.  This injection will be diagnostic and hopefully therapeutic.  Please see requesting physician notes for further details and justification.  Exam has shown concordant pain with facet joint loading.   ROS Otherwise per HPI.  Assessment & Plan: Visit Diagnoses:    ICD-10-CM   1. Cervical spondylosis without myelopathy  M47.812 XR C-ARM NO REPORT    Facet Injection    methylPREDNISolone  acetate (DEPO-MEDROL ) injection 40 mg      Plan: No additional findings.   Meds & Orders:  Meds ordered this encounter  Medications   methylPREDNISolone  acetate (DEPO-MEDROL ) injection 40 mg    Orders Placed This Encounter  Procedures   Facet Injection   XR C-ARM NO REPORT    Follow-up: Return for visit to requesting provider as needed.   Procedures: No procedures performed  Cervical Facet Joint Intra-Articular Injection with Fluoroscopic Guidance  Patient: [name]      Date of Birth: October 29, 1955 MRN: [id number] PCP: Norleen Lynwood ORN, MD      Visit Date: 04/02/2024   Universal Protocol:    Date/Time: 12/22/20255:17 AM  Consent Given By: the patient  Position: PRONE  Additional Comments: Vital signs were monitored before and after the procedure. Patient was prepped and draped in the usual sterile fashion. The correct patient, procedure, and site was verified.   Injection Procedure Details:  Procedure Site One Meds Administered:  Meds ordered  this encounter  Medications   methylPREDNISolone  acetate (DEPO-MEDROL ) injection 40 mg     Laterality: Left  Location/Site:  C4-5  Needle size: 25 G  Needle type: Spinal  Needle Placement: Articular  Findings:  -Contrast Used: 0.5 mL iohexol  180 mg iodine/mL   -Comments: Excellent flow of contrast producing a partial arthrogram.  Procedure Details: The region overlying the facet joints mentioned above were localized under fluoroscopic visualization. The needle was inserted down to the level of the lateral mass of the superior articular process of the facet joint to be injected. Then, the needle was walked off inferiorly into the lateral aspect of the facet joint. Bi-planar images were used for confirming placement and spot radiographs were documented.  A 0.25 ml volume of Omnipaque -240 was injected into the facet joint and a standard partial arthrogram was obtained. Radiographs were obtained of the arthrogram. A 0.5 ml. volume of the steroid/anesthetic solution was injected into the joint. This procedure was repeated for each facet joint injected.   Additional Comments:  The patient tolerated the procedure well Dressing: Band-Aid    Post-procedure details: Patient was observed during the procedure. Post-procedure instructions were reviewed.  Patient left the clinic in stable condition.   Clinical History: MRI CERVICAL SPINE WITHOUT CONTRAST 02/15/2024   TECHNIQUE: Multiplanar multisequence MRI of the cervical spine was performed.   COMPARISON: Radiography 01/29/2024.   CLINICAL HISTORY: Chronic left neck pain radiating down the left side with numbness, tingling, and burning for 3 months. History of arthritis and degenerative  changes.   FINDINGS:   BONES AND ALIGNMENT: Mild scoliotic curvature. No listhesis. Normal vertebral body heights. The foramen magnum is widely patent. Mild osteoarthritis of the C1-2 articulation.   SPINAL CORD: Normal spinal cord size.  No abnormal spinal cord signal. No cord compression or focal cord lesion.   SOFT TISSUES: No paraspinal mass.   C2-C3: Chronic fusion of the facet joints at C2-3. No canal or foraminal stenosis.   C3-C4: Chronic Fusion of the facet joints on the right. No canal or foraminal stenosis.   C4-C5: Endplate osteophytes and bulging of the disc more prominent towards the left. Facet arthritis on the left with pronounced edematous change. This could be a cause of neck pain or referred facet syndrome pain. Foraminal encroachment on the left could affect the left C5 nerve.   C5-C6: Mild uncovertebral prominence. No compressive canal or foraminal narrowing.   C6-C7: Endplate osteophytes and bulging of the disc. No compressive narrowing of the canal or foramina.   C7-T1: No disc abnormality. No stenosis. Mild facet osteoarthritis on the left.   IMPRESSION: 1. Left sided facet arthropathy at C4-5 with pronounced edematous change which could be a cause of localized pain or referred pain. C4-5 foraminal encroachment that could affect the left C5 nerve. Chronic fusion of the facet joints at C2-3 and C3-4 likely predispose to this abnormality.   Electronically signed by: Oneil Officer MD 02/18/2024     Objective:  VS:  HT:    WT:   BMI:     BP:124/80  HR:76bpm  TEMP: ( )  RESP:  Physical Exam Vitals and nursing note reviewed.  Constitutional:      General: She is not in acute distress.    Appearance: Normal appearance. She is not ill-appearing.  HENT:     Head: Normocephalic and atraumatic.     Right Ear: External ear normal.     Left Ear: External ear normal.  Eyes:     Extraocular Movements: Extraocular movements intact.  Cardiovascular:     Rate and Rhythm: Normal rate.     Pulses: Normal pulses.  Musculoskeletal:     Cervical back: Tenderness present. No rigidity.     Right lower leg: No edema.     Left lower leg: No edema.     Comments: Patient has good strength in the  upper extremities including 5 out of 5 strength in wrist extension long finger flexion and APB.  There is no atrophy of the hands intrinsically.  There is a negative Hoffmann's test.   Lymphadenopathy:     Cervical: No cervical adenopathy.  Skin:    Findings: No erythema, lesion or rash.  Neurological:     General: No focal deficit present.     Mental Status: She is alert and oriented to person, place, and time.     Sensory: No sensory deficit.     Motor: No weakness or abnormal muscle tone.     Coordination: Coordination normal.  Psychiatric:        Mood and Affect: Mood normal.        Behavior: Behavior normal.      Imaging: No results found. "

## 2024-04-23 ENCOUNTER — Encounter: Payer: Self-pay | Admitting: Rehabilitative and Restorative Service Providers"

## 2024-04-23 ENCOUNTER — Ambulatory Visit: Admitting: Rehabilitative and Restorative Service Providers"

## 2024-04-23 DIAGNOSIS — M542 Cervicalgia: Secondary | ICD-10-CM | POA: Diagnosis not present

## 2024-04-23 DIAGNOSIS — M25612 Stiffness of left shoulder, not elsewhere classified: Secondary | ICD-10-CM

## 2024-04-23 DIAGNOSIS — R293 Abnormal posture: Secondary | ICD-10-CM | POA: Diagnosis not present

## 2024-04-23 DIAGNOSIS — M6281 Muscle weakness (generalized): Secondary | ICD-10-CM

## 2024-04-23 DIAGNOSIS — M25512 Pain in left shoulder: Secondary | ICD-10-CM
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
## 2024-04-24 ENCOUNTER — Other Ambulatory Visit: Payer: Self-pay | Admitting: Cardiology

## 2024-05-06 ENCOUNTER — Ambulatory Visit: Admitting: Physical Therapy

## 2024-05-06 ENCOUNTER — Encounter: Payer: Self-pay | Admitting: Physical Therapy

## 2024-05-06 DIAGNOSIS — M6281 Muscle weakness (generalized): Secondary | ICD-10-CM | POA: Diagnosis not present

## 2024-05-06 DIAGNOSIS — M25612 Stiffness of left shoulder, not elsewhere classified: Secondary | ICD-10-CM | POA: Diagnosis not present

## 2024-05-06 DIAGNOSIS — M25512 Pain in left shoulder: Secondary | ICD-10-CM | POA: Diagnosis not present

## 2024-05-06 DIAGNOSIS — R293 Abnormal posture: Secondary | ICD-10-CM

## 2024-05-06 DIAGNOSIS — M542 Cervicalgia: Secondary | ICD-10-CM | POA: Diagnosis not present
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
## 2024-05-15 ENCOUNTER — Encounter: Payer: Self-pay | Admitting: Rehabilitative and Restorative Service Providers"

## 2024-05-15 ENCOUNTER — Ambulatory Visit: Admitting: Rehabilitative and Restorative Service Providers"

## 2024-05-15 DIAGNOSIS — M6281 Muscle weakness (generalized): Secondary | ICD-10-CM | POA: Diagnosis not present

## 2024-05-15 DIAGNOSIS — M25612 Stiffness of left shoulder, not elsewhere classified: Secondary | ICD-10-CM

## 2024-05-15 DIAGNOSIS — R293 Abnormal posture: Secondary | ICD-10-CM | POA: Diagnosis not present

## 2024-05-15 DIAGNOSIS — M25512 Pain in left shoulder: Secondary | ICD-10-CM | POA: Diagnosis not present

## 2024-05-15 DIAGNOSIS — M542 Cervicalgia: Secondary | ICD-10-CM
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
## 2024-05-16 ENCOUNTER — Other Ambulatory Visit (HOSPITAL_COMMUNITY): Payer: Self-pay

## 2024-05-17 ENCOUNTER — Other Ambulatory Visit: Payer: Self-pay | Admitting: Student

## 2024-05-19 ENCOUNTER — Other Ambulatory Visit (HOSPITAL_COMMUNITY): Payer: Self-pay

## 2024-05-19 MED ORDER — FUROSEMIDE 40 MG PO TABS
40.0000 mg | ORAL_TABLET | Freq: Every evening | ORAL | 1 refills | Status: AC
  Filled 2024-05-19: qty 90, 90d supply, fill #0

## 2024-05-21 ENCOUNTER — Other Ambulatory Visit (HOSPITAL_COMMUNITY): Payer: Self-pay

## 2024-05-21 ENCOUNTER — Other Ambulatory Visit: Payer: Self-pay

## 2024-05-21 MED ORDER — MUPIROCIN 2 % EX OINT
TOPICAL_OINTMENT | Freq: Two times a day (BID) | CUTANEOUS | 2 refills | Status: AC
  Filled 2024-05-21: qty 22, 30d supply, fill #0
  Filled 2024-05-21: qty 22, 25d supply, fill #0

## 2024-05-22 ENCOUNTER — Other Ambulatory Visit (HOSPITAL_COMMUNITY): Payer: Self-pay

## 2024-05-22 ENCOUNTER — Ambulatory Visit: Admitting: Physical Therapy

## 2024-05-22 ENCOUNTER — Encounter: Payer: Self-pay | Admitting: Physical Therapy

## 2024-05-22 DIAGNOSIS — M25612 Stiffness of left shoulder, not elsewhere classified: Secondary | ICD-10-CM

## 2024-05-22 DIAGNOSIS — M25512 Pain in left shoulder: Secondary | ICD-10-CM

## 2024-05-22 DIAGNOSIS — R293 Abnormal posture: Secondary | ICD-10-CM | POA: Diagnosis not present

## 2024-05-22 DIAGNOSIS — M542 Cervicalgia: Secondary | ICD-10-CM

## 2024-05-22 DIAGNOSIS — M6281 Muscle weakness (generalized): Secondary | ICD-10-CM | POA: Diagnosis not present
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
## 2024-05-23 ENCOUNTER — Telehealth: Payer: Self-pay | Admitting: Cardiology

## 2024-05-23 NOTE — Telephone Encounter (Signed)
*  STAT* If patient is at the pharmacy, call can be transferred to refill team.   1. Which medications need to be refilled? (please list name of each medication and dose if known)  rosuvastatin  (CRESTOR ) 40 MG tablet  2. Which pharmacy/location (including street and city if local pharmacy) is medication to be sent to?[id number] - [place], [place] - [address]   3. Do they need a 30 day or 90 day supply?  90 day supply

## 2024-05-26 NOTE — Telephone Encounter (Signed)
 Refill was sent 04/25/24 , pt aware.

## 2024-05-27 ENCOUNTER — Other Ambulatory Visit (HOSPITAL_COMMUNITY): Payer: Self-pay

## 2024-05-28 ENCOUNTER — Ambulatory Visit: Admitting: Rehabilitative and Restorative Service Providers"

## 2024-05-28 ENCOUNTER — Encounter: Payer: Self-pay | Admitting: Rehabilitative and Restorative Service Providers"

## 2024-05-28 DIAGNOSIS — M25512 Pain in left shoulder: Secondary | ICD-10-CM | POA: Diagnosis not present

## 2024-05-28 DIAGNOSIS — M25612 Stiffness of left shoulder, not elsewhere classified: Secondary | ICD-10-CM | POA: Diagnosis not present

## 2024-05-28 DIAGNOSIS — M6281 Muscle weakness (generalized): Secondary | ICD-10-CM

## 2024-05-28 DIAGNOSIS — M542 Cervicalgia: Secondary | ICD-10-CM

## 2024-05-28 DIAGNOSIS — R293 Abnormal posture: Secondary | ICD-10-CM | POA: Diagnosis not present
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
## 2024-05-29 ENCOUNTER — Ambulatory Visit: Admitting: Physical Therapy

## 2024-05-29 ENCOUNTER — Encounter: Payer: Self-pay | Admitting: Physical Therapy

## 2024-05-29 DIAGNOSIS — M6281 Muscle weakness (generalized): Secondary | ICD-10-CM | POA: Diagnosis not present

## 2024-05-29 DIAGNOSIS — M25612 Stiffness of left shoulder, not elsewhere classified: Secondary | ICD-10-CM

## 2024-05-29 DIAGNOSIS — R293 Abnormal posture: Secondary | ICD-10-CM | POA: Diagnosis not present

## 2024-05-29 DIAGNOSIS — M542 Cervicalgia: Secondary | ICD-10-CM | POA: Diagnosis not present

## 2024-05-29 DIAGNOSIS — M25512 Pain in left shoulder: Secondary | ICD-10-CM
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
## 2024-05-30 ENCOUNTER — Encounter: Admitting: Rehabilitative and Restorative Service Providers"

## 2024-06-12 ENCOUNTER — Encounter: Admitting: Rehabilitative and Restorative Service Providers"

## 2024-06-17 ENCOUNTER — Ambulatory Visit

## 2024-06-18 ENCOUNTER — Encounter: Admitting: Rehabilitative and Restorative Service Providers"

## 2024-06-20 ENCOUNTER — Encounter: Admitting: Rehabilitative and Restorative Service Providers"

## 2024-10-08 ENCOUNTER — Ambulatory Visit: Admitting: Orthopedic Surgery

## 2024-10-09 ENCOUNTER — Ambulatory Visit: Admitting: Allergy & Immunology

## 2024-11-20 ENCOUNTER — Encounter: Admitting: Nurse Practitioner

## 2025-01-29 ENCOUNTER — Ambulatory Visit
